# Patient Record
Sex: Female | Born: 1945 | Race: White | Hispanic: No | Smoking: Former smoker
Health system: Southern US, Community
[De-identification: ages and names within clinical notes are randomized; demographics above are authoritative.]

## PROBLEM LIST (undated history)

## (undated) DIAGNOSIS — M810 Age-related osteoporosis without current pathological fracture: Secondary | ICD-10-CM

## (undated) DIAGNOSIS — Z974 Presence of external hearing-aid: Secondary | ICD-10-CM

## (undated) DIAGNOSIS — J189 Pneumonia, unspecified organism: Secondary | ICD-10-CM

## (undated) DIAGNOSIS — I350 Nonrheumatic aortic (valve) stenosis: Secondary | ICD-10-CM

## (undated) DIAGNOSIS — R011 Cardiac murmur, unspecified: Secondary | ICD-10-CM

## (undated) DIAGNOSIS — I351 Nonrheumatic aortic (valve) insufficiency: Secondary | ICD-10-CM

## (undated) DIAGNOSIS — I4892 Unspecified atrial flutter: Secondary | ICD-10-CM

## (undated) DIAGNOSIS — I2510 Atherosclerotic heart disease of native coronary artery without angina pectoris: Secondary | ICD-10-CM

## (undated) DIAGNOSIS — R519 Headache, unspecified: Secondary | ICD-10-CM

## (undated) DIAGNOSIS — I421 Obstructive hypertrophic cardiomyopathy: Secondary | ICD-10-CM

## (undated) DIAGNOSIS — I5189 Other ill-defined heart diseases: Secondary | ICD-10-CM

## (undated) DIAGNOSIS — E785 Hyperlipidemia, unspecified: Secondary | ICD-10-CM

## (undated) DIAGNOSIS — Z87891 Personal history of nicotine dependence: Secondary | ICD-10-CM

## (undated) HISTORY — DX: Nonrheumatic aortic (valve) stenosis: I35.0

## (undated) HISTORY — DX: Cardiac murmur, unspecified: R01.1

## (undated) HISTORY — DX: Obstructive hypertrophic cardiomyopathy: I42.1

## (undated) HISTORY — PX: GANGLION CYST EXCISION: SHX1691

## (undated) HISTORY — DX: Unspecified atrial flutter: I48.92

## (undated) HISTORY — DX: Hyperlipidemia, unspecified: E78.5

## (undated) HISTORY — PX: TONSILLECTOMY: SUR1361

## (undated) HISTORY — PX: TUBAL LIGATION: SHX77

## (undated) HISTORY — DX: Age-related osteoporosis without current pathological fracture: M81.0

## (undated) HISTORY — PX: EYE SURGERY: SHX253

## (undated) HISTORY — DX: Atherosclerotic heart disease of native coronary artery without angina pectoris: I25.10

---

## 1980-04-26 HISTORY — PX: BREAST SURGERY: SHX581

## 1988-04-26 HISTORY — PX: HERNIA REPAIR: SHX51

## 1998-02-28 ENCOUNTER — Ambulatory Visit (HOSPITAL_BASED_OUTPATIENT_CLINIC_OR_DEPARTMENT_OTHER): Admission: RE | Admit: 1998-02-28 | Discharge: 1998-02-28 | Payer: Self-pay | Admitting: Orthopedic Surgery

## 1999-08-25 ENCOUNTER — Encounter: Admission: RE | Admit: 1999-08-25 | Discharge: 1999-08-25 | Payer: Self-pay | Admitting: Family Medicine

## 1999-08-25 ENCOUNTER — Encounter: Payer: Self-pay | Admitting: Family Medicine

## 2000-03-07 ENCOUNTER — Ambulatory Visit (HOSPITAL_COMMUNITY): Admission: RE | Admit: 2000-03-07 | Discharge: 2000-03-07 | Payer: Self-pay | Admitting: Family Medicine

## 2000-03-07 ENCOUNTER — Encounter: Payer: Self-pay | Admitting: Family Medicine

## 2000-07-06 ENCOUNTER — Ambulatory Visit (HOSPITAL_COMMUNITY): Admission: RE | Admit: 2000-07-06 | Discharge: 2000-07-06 | Payer: Self-pay | Admitting: Family Medicine

## 2000-07-06 ENCOUNTER — Encounter: Payer: Self-pay | Admitting: Family Medicine

## 2002-03-05 ENCOUNTER — Encounter: Payer: Self-pay | Admitting: Family Medicine

## 2002-03-05 ENCOUNTER — Ambulatory Visit: Admission: RE | Admit: 2002-03-05 | Discharge: 2002-03-05 | Payer: Self-pay | Admitting: Family Medicine

## 2003-04-15 ENCOUNTER — Ambulatory Visit (HOSPITAL_COMMUNITY): Admission: RE | Admit: 2003-04-15 | Discharge: 2003-04-15 | Payer: Self-pay | Admitting: *Deleted

## 2003-04-27 HISTORY — PX: CARDIAC CATHETERIZATION: SHX172

## 2003-04-27 HISTORY — PX: ATRIAL ABLATION SURGERY: SHX560

## 2003-07-23 ENCOUNTER — Inpatient Hospital Stay (HOSPITAL_COMMUNITY): Admission: RE | Admit: 2003-07-23 | Discharge: 2003-07-25 | Payer: Self-pay | Admitting: Internal Medicine

## 2003-12-04 ENCOUNTER — Other Ambulatory Visit: Admission: RE | Admit: 2003-12-04 | Discharge: 2003-12-04 | Payer: Self-pay | Admitting: Family Medicine

## 2006-04-04 ENCOUNTER — Encounter: Admission: RE | Admit: 2006-04-04 | Discharge: 2006-04-04 | Payer: Self-pay | Admitting: Interventional Cardiology

## 2008-10-09 ENCOUNTER — Encounter: Payer: Self-pay | Admitting: Cardiology

## 2009-01-20 ENCOUNTER — Encounter: Payer: Self-pay | Admitting: Cardiology

## 2009-03-06 ENCOUNTER — Ambulatory Visit: Payer: Self-pay | Admitting: Internal Medicine

## 2009-03-06 DIAGNOSIS — E785 Hyperlipidemia, unspecified: Secondary | ICD-10-CM | POA: Insufficient documentation

## 2009-03-06 DIAGNOSIS — M810 Age-related osteoporosis without current pathological fracture: Secondary | ICD-10-CM | POA: Insufficient documentation

## 2009-03-06 LAB — CONVERTED CEMR LAB
Bilirubin, Direct: 0.1 mg/dL (ref 0.0–0.3)
Cholesterol, target level: 200 mg/dL
HDL goal, serum: 40 mg/dL
HDL: 74 mg/dL (ref >39.00)
LDL Goal: 160 mg/dL
TSH: 0.74 microintl units/mL (ref 0.35–5.50)
Total Bilirubin: 0.8 mg/dL (ref 0.3–1.2)
Total Protein: 7.2 g/dL (ref 6.0–8.3)
Triglycerides: 83 mg/dL (ref 0.0–149.0)

## 2009-03-07 ENCOUNTER — Encounter: Payer: Self-pay | Admitting: Internal Medicine

## 2009-06-06 ENCOUNTER — Ambulatory Visit: Payer: Self-pay | Admitting: Internal Medicine

## 2009-06-06 DIAGNOSIS — R011 Cardiac murmur, unspecified: Secondary | ICD-10-CM | POA: Insufficient documentation

## 2009-06-10 ENCOUNTER — Encounter (INDEPENDENT_AMBULATORY_CARE_PROVIDER_SITE_OTHER): Payer: Self-pay | Admitting: *Deleted

## 2009-06-23 DIAGNOSIS — R079 Chest pain, unspecified: Secondary | ICD-10-CM | POA: Insufficient documentation

## 2009-06-23 DIAGNOSIS — I498 Other specified cardiac arrhythmias: Secondary | ICD-10-CM | POA: Insufficient documentation

## 2009-06-26 ENCOUNTER — Ambulatory Visit: Payer: Self-pay | Admitting: Cardiology

## 2009-06-26 DIAGNOSIS — I2510 Atherosclerotic heart disease of native coronary artery without angina pectoris: Secondary | ICD-10-CM | POA: Insufficient documentation

## 2009-06-26 DIAGNOSIS — I2583 Coronary atherosclerosis due to lipid rich plaque: Secondary | ICD-10-CM

## 2009-07-05 ENCOUNTER — Encounter: Payer: Self-pay | Admitting: Cardiology

## 2009-07-18 ENCOUNTER — Ambulatory Visit: Payer: Self-pay | Admitting: Cardiology

## 2009-07-18 ENCOUNTER — Ambulatory Visit: Payer: Self-pay

## 2009-07-18 ENCOUNTER — Ambulatory Visit (HOSPITAL_COMMUNITY): Admission: RE | Admit: 2009-07-18 | Discharge: 2009-07-18 | Payer: Self-pay | Admitting: Cardiology

## 2009-07-18 ENCOUNTER — Encounter (INDEPENDENT_AMBULATORY_CARE_PROVIDER_SITE_OTHER): Payer: Self-pay | Admitting: *Deleted

## 2009-07-18 ENCOUNTER — Encounter: Payer: Self-pay | Admitting: Cardiology

## 2009-07-24 ENCOUNTER — Telehealth: Payer: Self-pay | Admitting: Cardiology

## 2009-08-04 ENCOUNTER — Encounter: Payer: Self-pay | Admitting: Internal Medicine

## 2009-12-25 ENCOUNTER — Ambulatory Visit: Payer: Self-pay | Admitting: Internal Medicine

## 2009-12-25 DIAGNOSIS — H9190 Unspecified hearing loss, unspecified ear: Secondary | ICD-10-CM | POA: Insufficient documentation

## 2009-12-25 LAB — HM MAMMOGRAPHY: HM Mammogram: NORMAL

## 2010-01-06 ENCOUNTER — Ambulatory Visit: Payer: Self-pay | Admitting: Internal Medicine

## 2010-01-15 ENCOUNTER — Encounter: Payer: Self-pay | Admitting: Internal Medicine

## 2010-01-18 ENCOUNTER — Encounter: Payer: Self-pay | Admitting: Internal Medicine

## 2010-01-18 ENCOUNTER — Telehealth: Payer: Self-pay | Admitting: Internal Medicine

## 2010-01-30 ENCOUNTER — Ambulatory Visit: Payer: Self-pay | Admitting: Internal Medicine

## 2010-01-30 LAB — CONVERTED CEMR LAB
BUN: 23 mg/dL (ref 6–23)
Basophils Absolute: 0.1 10*3/uL (ref 0.0–0.1)
Calcium, Total (PTH): 9.8 mg/dL (ref 8.4–10.5)
Chloride: 103 meq/L (ref 96–112)
Creatinine, Ser: 0.7 mg/dL (ref 0.4–1.2)
Eosinophils Absolute: 0.1 10*3/uL (ref 0.0–0.7)
Eosinophils Relative: 1.4 % (ref 0.0–5.0)
MCHC: 34.6 g/dL (ref 30.0–36.0)
MCV: 91.3 fL (ref 78.0–100.0)
Monocytes Absolute: 0.5 10*3/uL (ref 0.1–1.0)
Neutrophils Relative %: 57.2 % (ref 43.0–77.0)
PTH: 48.5 pg/mL (ref 14.0–72.0)
Platelets: 299 10*3/uL (ref 150.0–400.0)
RDW: 12.8 % (ref 11.5–14.6)
TSH: 1 microintl units/mL (ref 0.35–5.50)
Total Bilirubin: 0.6 mg/dL (ref 0.3–1.2)
WBC: 7.5 10*3/uL (ref 4.5–10.5)

## 2010-02-04 ENCOUNTER — Encounter: Payer: Self-pay | Admitting: Internal Medicine

## 2010-02-10 ENCOUNTER — Telehealth: Payer: Self-pay | Admitting: Internal Medicine

## 2010-02-12 ENCOUNTER — Encounter: Payer: Self-pay | Admitting: Internal Medicine

## 2010-03-02 ENCOUNTER — Encounter: Payer: Self-pay | Admitting: Internal Medicine

## 2010-03-11 ENCOUNTER — Ambulatory Visit: Payer: Self-pay | Admitting: Internal Medicine

## 2010-03-11 LAB — CONVERTED CEMR LAB
Cholesterol, target level: 200 mg/dL
LDL Goal: 100 mg/dL

## 2010-05-01 ENCOUNTER — Ambulatory Visit
Admission: RE | Admit: 2010-05-01 | Discharge: 2010-05-01 | Payer: Self-pay | Source: Home / Self Care | Attending: Internal Medicine | Admitting: Internal Medicine

## 2010-05-28 NOTE — Miscellaneous (Signed)
Summary: Outpatient Coinsurance Notice  Outpatient Coinsurance Notice   Imported By: Marylou Mccoy 07/30/2009 09:23:19  _____________________________________________________________________  External Attachment:    Type:   Image     Comment:   External Document

## 2010-05-28 NOTE — Consult Note (Signed)
Summary: Consultation Report  Consultation Report   Imported By: Marylou Mccoy 07/31/2009 16:16:00  _____________________________________________________________________  External Attachment:    Type:   Image     Comment:   External Document

## 2010-05-28 NOTE — Progress Notes (Signed)
Summary: TEST RESULTS  Phone Note Call from Patient Call back at [phone]   Caller: Patient Reason for Call: Lab or Test Results Initial call taken by: Judie Grieve,  July 24, 2009 10:15 AM  Follow-up for Phone Call        [facility] for pt to call Katina Dung, RN, BSN  July 24, 2009 10:38 AM  talked with pt about echo results from 07-18-09

## 2010-05-28 NOTE — Progress Notes (Signed)
Summary: forteo  ---- Converted from flag ---- ---- 01/30/2010 4:15 PM, Etta Grandchild MD wrote: [place]: will you set her up for a forteo class.  TJ ------------------------------  Phone Note Outgoing Call   Summary of Call: Form completed and faxed to forte[facility], also sent to be scanned.Alvy Beal Archie CMA  February 10, 2010 1:04 PM

## 2010-05-28 NOTE — Assessment & Plan Note (Signed)
Summary: flu shot/Phillippe Orlick/lakisha/cd  Nurse Visit   Allergies: No Known Drug Allergies  Orders Added: 1)  Admin 1st Vaccine [90471] 2)  Flu Vaccine 44yrs + [13086] Flu Vaccine Consent Questions     Do you have a history of severe allergic reactions to this vaccine? no    Any prior history of allergic reactions to egg and/or gelatin? no    Do you have a sensitivity to the preservative Thimersol? no    Do you have a past history of Guillan-Barre Syndrome? no    Do you currently have an acute febrile illness? no    Have you ever had a severe reaction to latex? no    Vaccine information given and explained to patient? yes    Are you currently pregnant? no    Lot Number:[id number]   Exp Date:10/24/2010   Site Given  Left Deltoid IM

## 2010-05-28 NOTE — Assessment & Plan Note (Signed)
Summary: follow up-lb   Vital Signs:  Patient profile:   65 year old female Height:      61 inches Weight:      142 pounds BMI:     26.93 O2 Sat:      97 % on Room air Temp:     97.5 degrees F oral Pulse rate:   92 / minute Pulse rhythm:   regular Resp:     16 per minute BP sitting:   126 / 70  (left arm) Cuff size:   large  Vitals Entered By: Rock Nephew CMA (December 25, 2009 3:50 PM)  Nutrition Counseling: Patient's BMI is greater than 25 and therefore counseled on weight management options.  O2 Flow:  Room air  Primary Care Ihor Meinzer:  Etta Grandchild MD   History of Present Illness: She returns with several complaints. 1. left hearing loss for one year, she occasionally has vertigo 2. needs a complete physical and vaccines-she refuses to do a colonoscopy 3. needs a refill on ntg- says she never uses it 4. needs an updated BMD  Preventive Screening-Counseling & Management  Alcohol-Tobacco     Alcohol drinks/day: <1     Alcohol type: wine     >5/day in last 3 mos: no     Alcohol Counseling: not indicated; use of alcohol is not excessive or problematic     Feels need to cut down: no     Feels annoyed by complaints: no     Feels guilty re: drinking: no     Needs 'eye opener' in am: no     Smoking Status: quit     Year Quit: 2002     Tobacco Counseling: to remain off tobacco products  Hep-HIV-STD-Contraception     Hepatitis Risk: no risk noted     HIV Risk: no risk noted     STD Risk: no risk noted     Dental Visit-last 6 months yes     Dental Care Counseling: to seek dental care; no dental care within six months     SBE monthly: yes     SBE Education/Counseling: to perform regular SBE  Safety-Violence-Falls     Seat Belt Use: yes     Helmet Use: yes     Firearms in the Home: no firearms in the home     Smoke Detectors: yes     Violence in the Home: no risk noted     Sexual Abuse: no      Sexual History:  currently monogamous.        Drug Use:   no.        Blood Transfusions:  no.    Medications Prior to Update: 1)  Simvastatin 80 Mg Tabs (Simvastatin) .... Take 1 Tablet By Mouth Once A Day 2)  Alendronate Sodium 70 Mg Tabs (Alendronate Sodium) .... Weekly 3)  Fish Oil   Oil (Fish Oil) .... Tab By Mouth Once Daily 4)  Vitamin B-12 500 Mcg Tabs (Cyanocobalamin) .Marland Kitchen.. 1 Tab By Mouth Once Daily 5)  Vitamin D 1000 Unit Tabs (Cholecalciferol) .Marland Kitchen.. 1 Tab By Mouth Once Daily 6)  Aspirin 81 Mg  Tabs (Aspirin) .... Tab By Mouth Once Daily  Current Medications (verified): 1)  Simvastatin 80 Mg Tabs (Simvastatin) .... Take 1 Tablet By Mouth Once A Day 2)  Alendronate Sodium 70 Mg Tabs (Alendronate Sodium) .... Weekly 3)  Fish Oil   Oil (Fish Oil) .... Tab By Mouth Once Daily 4)  Vitamin  B-12 500 Mcg Tabs (Cyanocobalamin) .Marland Kitchen.. 1 Tab By Mouth Once Daily 5)  Vitamin D 1000 Unit Tabs (Cholecalciferol) .Marland Kitchen.. 1 Tab By Mouth Once Daily 6)  Aspirin 81 Mg  Tabs (Aspirin) .... Tab By Mouth Once Daily 7)  Nitrolingual 0.4 Mg/spray Soln (Nitroglycerin) .... 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 Ladona Ridgel OSTEOPOROSIS (ICD-733.00)-Dr. 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   Risk Factors: Alcohol Use: <1 (12/25/2009) >5 drinks/d w/in last 3 months: no (12/25/2009) Exercise: no (03/06/2009)  Risk Factors: Smoking Status: quit  (12/25/2009)  Family History: Reviewed history from 06/26/2009 and no changes required. Family History Hypertension Father had CABG and valve replacement at age 14 Mother with congestive heart failure  Social History: Reviewed history from 06/26/2009 and no changes required. Occupation: Banking Divorced Alcohol use-yes Drug use-no Regular exercise-no Tobacco Use - Former.  Dental Care w/in 6 mos.:  yes Seat Belt Use:  yes Blood Transfusions:  no Sexual History:  currently monogamous  Review of Systems       The patient complains of weight gain.  The patient denies anorexia, fever, weight loss, chest pain, syncope, dyspnea on exertion, peripheral edema, prolonged cough, headaches, hemoptysis, abdominal pain, hematuria, suspicious skin lesions, difficulty walking, depression, and enlarged lymph nodes.   ENT:  Complains of decreased hearing and ringing in ears; denies difficulty swallowing, ear discharge, earache, hoarseness, nasal congestion, nosebleeds, postnasal drainage, sinus pressure, and sore throat. CV:  Denies chest pain or discomfort, difficulty breathing at night, fainting, fatigue, lightheadness, near fainting, palpitations, shortness of breath with exertion, and swelling of feet.  Physical Exam  General:  Well-developed,well-nourished,in no acute distress; alert,appropriate and cooperative throughout examination Head:  Normocephalic and atraumatic without obvious abnormalities. No apparent alopecia or balding. Ears:  R ear normal and L ear normal.  R ear normal and L ear normal.   Nose:  External nasal examination shows no deformity or inflammation. Nasal mucosa are pink and moist without lesions or exudates. Mouth:  Oral mucosa and oropharynx without lesions or exudates.  Teeth in good repair. Neck:  supple, full ROM, no masses, no thyromegaly, no thyroid nodules or tenderness, no JVD, normal carotid upstroke, no carotid bruits, no cervical lymphadenopathy, and no neck  tenderness.   Lungs:  normal respiratory effort, no intercostal retractions, no accessory muscle use, normal breath sounds, and no dullness.   Heart:  normal rate, regular rhythm, no gallop, no rub, and grade  1/6 HSM.   Abdomen:  soft, non-tender, normal bowel sounds, no distention, no masses, no guarding, no rigidity, no rebound tenderness, no abdominal hernia, no inguinal hernia, no hepatomegaly, and no splenomegaly.   Msk:  normal ROM, no joint tenderness, no joint swelling, no joint warmth, no redness over joints, no joint deformities, no joint instability, and no crepitation.   Pulses:  R and L carotid,radial,femoral,dorsalis pedis and posterior tibial pulses are full and equal bilaterally Extremities:  No clubbing, cyanosis, edema, or deformity noted with normal full range of motion of all joints.   Neurologic:  No  cranial nerve deficits noted. Station and gait are normal. Plantar reflexes are down-going bilaterally. DTRs are symmetrical throughout. Sensory, motor and coordinative functions appear intact. Skin:  turgor normal, color normal, no rashes, no suspicious lesions, no ecchymoses, no petechiae, no purpura, no ulcerations, and no edema.   Cervical Nodes:  No lymphadenopathy noted Psych:  Cognition and judgment appear intact. Alert and cooperative with normal attention span and concentration. No apparent delusions, illusions, hallucinations   Impression & Recommendations:  Problem # 1:  UNSPECIFIED HEARING LOSS (ICD-389.9) Assessment New  Orders: Audiology (Audio)  Problem # 2:  OSTEOPOROSIS (ICD-733.00) Assessment: Unchanged  Her updated medication list for this problem includes:    Alendronate Sodium 70 Mg Tabs (Alendronate sodium) .Marland Kitchen... Weekly  Orders: T-Bone Densitometry [phone])  Discussed medication use, applications of heat or ice, and exercises.   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 Tab by mouth once daily    Nitrolingual 0.4 Mg/spray Soln (Nitroglycerin) ..... Use as directed  Complete Medication List: 1)  Simvastatin 80 Mg Tabs (Simvastatin) .... Take 1 tablet by mouth once a day 2)  Alendronate Sodium 70 Mg Tabs (Alendronate sodium) .... Weekly 3)  Fish Oil Oil (Fish oil) .... Tab by mouth once daily 4)  Vitamin B-12 500 Mcg Tabs (Cyanocobalamin) .Marland Kitchen.. 1 tab by mouth once daily 5)  Vitamin D 1000 Unit Tabs (Cholecalciferol) .Marland Kitchen.. 1 tab by mouth once daily 6)  Aspirin 81 Mg Tabs (Aspirin) .... Tab by mouth once daily 7)  Nitrolingual 0.4 Mg/spray Soln (Nitroglycerin) .... Use as directed  Other Orders: Tdap => 60yrs IM (60454) Admin 1st Vaccine (09811) Zoster (Shingles) Vaccine Live [phone]) Admin of Any Addtl Vaccine (29562)  Colorectal Screening:  Current Recommendations:    Hemoccult: patient refused    Colonoscopy recommended: patient refused  PAP Screening:    Hx Cervical Dysplasia in last 5 yrs? No    3 normal PAP smears in last 5 yrs? Yes    Last PAP smear:  09/05/2007    Reviewed PAP smear recommendations:  patient defers to GYN Carrie [name]  Mammogram Screening:    Reviewed Mammogram recommendations:  mammogram ordered  Osteoporosis Risk Assessment:  Risk Factors for Fracture or Low Bone Density:   Race (White or Asian):     yes   Hx of Fractures:       no   FH of Osteoporosis:     yes   Hx of falls:       no   Physically inactive:     yes   Smoking status:       quit   High alcohol use:     no   Low calcium/Vit. D intake:   no   Corticosteroid use:     no   Heparin use:       no   Thyroid disease:     no   Rheumatoid Arthritis:     no  Bone Density (DEXA Scan) Results:    Results:   Osteoporosis  Immunization & Chemoprophylaxis:    Tetanus vaccine: Tdap  (12/25/2009)  Patient Instructions: 1)  Please schedule a follow-up appointment in 2 months. 2)  It is important that you exercise regularly at least 20  minutes 5 times a week. If you develop chest pain, have severe difficulty breathing, or feel very tired , stop exercising immediately and seek medical attention. 3)  You need to lose weight. Consider a lower calorie diet and regular exercise.  4)  Schedule your mammogram. 5)  Schedule a colonoscopy/sigmoidoscopy to help detect colon cancer. 6)  You need to have a Pap Smear to prevent cervical cancer. 7)  Take calcium +Vitamin D daily. Prescriptions: NITROLINGUAL 0.4 MG/SPRAY SOLN (NITROGLYCERIN) use as directed  #4.9 g x 11   Entered and Authorized by:   Etta Grandchild MD   Signed by:   Etta Grandchild MD on 12/25/2009   Method used:   Print then Give to Patient   RxID:   [id number]     Immunizations Administered:  Tetanus Vaccine:    Vaccine Type: Tdap    Site: right deltoid    Mfr: GlaxoSmithKline    Dose: 0.5 ml    Route: IM    Given by: Rock Nephew CMA    Exp. Date: 10/24/2011    Lot #: [id number]    VIS given: 03/13/08 version given December 25, 2009.  Zostavax # 1:    Vaccine Type: Zostavax    Site: left deltoid    Mfr: Merck    Dose: 0.65    Route: [place]    Given by: Rock Nephew CMA    Exp. Date: 11/19/2010    Lot #: [id number]    VIS given: 02/05/05 given December 25, 2009.

## 2010-05-28 NOTE — Assessment & Plan Note (Signed)
Summary: FU PER PT D/T---STC   Vital Signs:  Patient profile:   65 year old female Height:      61 inches Weight:      142 pounds O2 Sat:      95 % on Room air Temp:     98.2 degrees F oral Pulse rate:   80 / minute Pulse rhythm:   regular Resp:     16 per minute BP sitting:   124 / 70  (left arm) Cuff size:   large  Vitals Entered By: Rock Nephew CMA (June 06, 2009 3:53 PM)  O2 Flow:  Room air CC: cough, r ear pressure, URI symptoms, Lipid Management   Primary Care Provider:  Etta Grandchild MD  CC:  cough, r ear pressure, URI symptoms, and Lipid Management.  History of Present Illness:  URI Symptoms      This is a 65 year old woman who presents with URI symptoms.  The symptoms began 3 days ago.  The severity is described as mild.  The patient reports sore throat and dry cough, but denies nasal congestion, clear nasal discharge, purulent nasal discharge, productive cough, earache, and sick contacts.  The patient denies fever, stiff neck, dyspnea, wheezing, rash, vomiting, diarrhea, and use of an antipyretic.  The patient denies sneezing, headache, muscle aches, and severe fatigue.  The patient denies the following risk factors for Strep sinusitis: unilateral facial pain, unilateral nasal discharge, poor response to decongestant, double sickening, tooth pain, Strep exposure, tender adenopathy, and absence of cough.    Lipid Management History:      Positive NCEP/ATP III risk factors include female age 65 years old or older.  Negative NCEP/ATP III risk factors include no history of early menopause without estrogen hormone replacement, non-diabetic, HDL cholesterol greater than 60, no family history for ischemic heart disease, non-tobacco-user status, non-hypertensive, no ASHD (atherosclerotic heart disease), no prior stroke/TIA, no peripheral vascular disease, and no history of aortic aneurysm.        The patient states that she knows about the "Therapeutic Lifestyle Change"  diet.  Her compliance with the TLC diet is fair.  The patient expresses understanding of adjunctive measures for cholesterol lowering.  Adjunctive measures started by the patient include aerobic exercise, fiber, limit alcohol consumpton, and weight reduction.  She expresses no side effects from her lipid-lowering medication.  The patient denies any symptoms to suggest myopathy or liver disease.     Preventive Screening-Counseling & Management  Alcohol-Tobacco     Alcohol drinks/day: <1     Alcohol type: wine     >5/day in last 3 mos: no     Alcohol Counseling: not indicated; use of alcohol is not excessive or problematic     Feels need to cut down: no     Feels annoyed by complaints: no     Feels guilty re: drinking: no     Needs 'eye opener' in am: no     Smoking Status: quit     Year Quit: 2002  Hep-HIV-STD-Contraception     Hepatitis Risk: no risk noted     HIV Risk: no risk noted     STD Risk: no risk noted      Drug Use:  no.    Clinical Review Panels:  Lipid Management   Cholesterol:  222 (03/06/2009)   HDL (good cholesterol):  74.00 (03/06/2009)  Complete Metabolic Panel   Albumin:  4.0 (03/06/2009)   Total Protein:  7.2 (03/06/2009)   Total  Bili:  0.8 (03/06/2009)   Alk Phos:  62 (03/06/2009)   SGPT (ALT):  22 (03/06/2009)   SGOT (AST):  21 (03/06/2009)   Medications Prior to Update: 1)  Simvastatin 80 Mg Tabs (Simvastatin) .... Take 1 Tablet By Mouth Once A Day 2)  Alendronate Sodium 70 Mg Tabs (Alendronate Sodium)  Current Medications (verified): 1)  Simvastatin 80 Mg Tabs (Simvastatin) .... Take 1 Tablet By Mouth Once A Day 2)  Alendronate Sodium 70 Mg Tabs (Alendronate Sodium)  Allergies (verified): No Known Drug Allergies  Past History:  Past Medical History: Reviewed history from 03/06/2009 and no changes required. Hyperlipidemia Osteoporosis-Dr. Luiz Blare Heart murmur- Dr. Ladona Ridgel  Past Surgical History: Reviewed history from 03/06/2009 and no  changes required. Denies surgical history  Family History: Reviewed history from 03/06/2009 and no changes required. Family History Hypertension  Social History: Reviewed history from 03/06/2009 and no changes required. Occupation: Photographer Divorced Alcohol use-yes Drug use-no Regular exercise-no  Review of Systems CV:  Denies bluish discoloration of lips or nails, chest pain or discomfort, difficulty breathing while lying down, fainting, fatigue, leg cramps with exertion, lightheadness, near fainting, palpitations, shortness of breath with exertion, swelling of feet, and weight gain.  Physical Exam  General:  Well-developed,well-nourished,in no acute distress; alert,appropriate and cooperative throughout examination Head:  Normocephalic and atraumatic without obvious abnormalities. No apparent alopecia or balding. Eyes:  No corneal or conjunctival inflammation noted. EOMI. Perrla. Funduscopic exam benign, without hemorrhages, exudates or papilledema. Vision grossly normal. Ears:  R ear normal and L ear normal.   Nose:  External nasal examination shows no deformity or inflammation. Nasal mucosa are pink and moist without lesions or exudates. Mouth:  Oral mucosa and oropharynx without lesions or exudates.  Teeth in good repair. Neck:  supple, full ROM, no masses, no thyromegaly, no JVD, normal carotid upstroke, and no carotid bruits.   Lungs:  normal respiratory effort, no intercostal retractions, no accessory muscle use, normal breath sounds, and no dullness.   Heart:  normal rate, regular rhythm, no gallop, no rub, and grade  1/6 HSM.   Abdomen:  soft, non-tender, normal bowel sounds, no distention, no masses, no guarding, no rigidity, no rebound tenderness, no hepatomegaly, and no splenomegaly.   Msk:  normal ROM, no joint tenderness, no joint swelling, and no joint warmth.   Pulses:  R and L carotid,radial,femoral,dorsalis pedis and posterior tibial pulses are full and equal  bilaterally Extremities:  No clubbing, cyanosis, edema, or deformity noted with normal full range of motion of all joints.   Neurologic:  No cranial nerve deficits noted. Station and gait are normal. Plantar reflexes are down-going bilaterally. DTRs are symmetrical throughout. Sensory, motor and coordinative functions appear intact. Skin:  turgor normal, color normal, no rashes, no suspicious lesions, no ecchymoses, no petechiae, no purpura, no ulcerations, and no edema.   Cervical Nodes:  No lymphadenopathy noted Axillary Nodes:  No palpable lymphadenopathy Psych:  Cognition and judgment appear intact. Alert and cooperative with normal attention span and concentration. 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 Take 1 tablet by mouth once a day  Labs Reviewed: SGOT: 21 (03/06/2009)   SGPT: 22 (03/06/2009)  Lipid Goals: Chol Goal: 200 (03/06/2009)   HDL Goal:  40 (03/06/2009)   LDL Goal: 160 (03/06/2009)   TG Goal: 150 (03/06/2009)  Prior 10 Yr Risk Heart Disease: Not enough information (03/06/2009)   HDL:74.00 (03/06/2009)  Chol:222 (03/06/2009)  Trig:83.0 (03/06/2009)  Complete Medication List: 1)  Simvastatin 80 Mg Tabs (Simvastatin) .... Take 1 tablet by mouth once a day 2)  Alendronate Sodium 70 Mg Tabs (Alendronate sodium)  Lipid Assessment/Plan:      Based on NCEP/ATP III, the patient's risk factor category is "0-1 risk factors".  The patient's lipid goals are as follows: Total cholesterol goal is 200; LDL cholesterol goal is 160; HDL cholesterol goal is 40; Triglyceride goal is 150.    Patient Instructions: 1)  Please schedule a follow-up appointment in 4  months. 2)  Get plenty of rest, drink lots of clear liquids, and use Tylenol or Ibuprofen for fever and comfort. Return in 7-10 days if you're not better:sooner if you're feeling worse. Prescriptions: SIMVASTATIN 80 MG TABS (SIMVASTATIN) Take 1 tablet by mouth once a day  #30 x 11   Entered and Authorized by:   Etta Grandchild MD   Signed by:   Etta Grandchild MD on 06/06/2009   Method used:   Print then Give to Patient   RxID:   [id number]

## 2010-05-28 NOTE — Assessment & Plan Note (Signed)
Summary: PER PT RECEIVED SCHED APPT LETTER-GO OVER BONE DENSITY--STC   Vital Signs:  Patient profile:   65 year old female Height:      61 inches Weight:      145 pounds BMI:     27.50 O2 Sat:      97 % on Room air Temp:     98.7 degrees F oral Pulse rate:   77 / minute Pulse rhythm:   regular Resp:     16 per minute BP sitting:   112 / 68  (left arm) Cuff size:   large  Vitals Entered By: Rock Nephew CMA (January 30, 2010 3:52 PM)  Nutrition Counseling: Patient's BMI is greater than 25 and therefore counseled on weight management options.  O2 Flow:  Room air  Primary Care Provider:  Etta Grandchild MD   History of Present Illness: She returns to talk about treatment options for osteoporosis, her BMD showed STD -2.5 and worse. She has taken bisphospsonates for "a while" but she is not sure if it has been more than 5 years ago and she stopped bisphosphonates "a while back."  She has no complaints today.  Current Medications (verified): 1)  Simvastatin 80 Mg Tabs (Simvastatin) .... Take 1 Tablet By Mouth Once A Day 2)  Alendronate Sodium 70 Mg Tabs (Alendronate Sodium) .... Weekly 3)  Fish Oil   Oil (Fish Oil) .... Tab By Mouth Once Daily 4)  Vitamin B-12 500 Mcg Tabs (Cyanocobalamin) .Marland Kitchen.. 1 Tab By Mouth Once Daily 5)  Vitamin D 1000 Unit Tabs (Cholecalciferol) .Marland Kitchen.. 1 Tab By Mouth Once Daily 6)  Aspirin 81 Mg  Tabs (Aspirin) .... Tab By Mouth Once Daily 7)  Nitrolingual 0.4 Mg/spray Soln (Nitroglycerin) .... 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 Ladona Ridgel OSTEOPOROSIS (ICD-733.00)-Dr. 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   Risk Factors: Alcohol Use: <1 (12/25/2009) >5 drinks/d w/in last 3 months: no (12/25/2009) Exercise: no (03/06/2009)  Risk Factors: Smoking Status: quit (12/25/2009)  Family History: Reviewed history from 06/26/2009 and no changes required. Family History Hypertension Father had CABG and valve replacement at age 45 Mother with congestive heart failure  Social History: Reviewed history from 06/26/2009 and no changes required. Occupation: Banking Divorced Alcohol use-yes Drug use-no Regular exercise-no Tobacco Use - Former.   Review of Systems       The patient complains of weight gain.  The patient denies weight loss, chest pain, syncope, dyspnea on exertion, peripheral edema, prolonged cough, headaches, hemoptysis, abdominal pain, and difficulty walking.   CV:  Denies chest pain or discomfort, difficulty breathing at night, difficulty breathing while lying down, fainting, fatigue, near fainting, palpitations, shortness of breath with exertion, and swelling of feet. MS:  Denies joint pain, joint redness, joint swelling, loss of strength, low back pain, mid back pain, muscle aches, cramps, and thoracic pain.  Physical Exam  General:  Well-developed,well-nourished,in no acute distress; alert,appropriate and cooperative throughout examination Head:  Normocephalic and atraumatic without obvious abnormalities. No apparent alopecia or balding. Mouth:  Oral mucosa and oropharynx without lesions or exudates.  Teeth in good repair. Neck:  supple, full ROM, no masses, no thyromegaly, no thyroid nodules or tenderness, no JVD, normal carotid upstroke, no carotid bruits, no cervical lymphadenopathy, and no neck  tenderness.   Lungs:  normal respiratory effort, no intercostal retractions, no accessory muscle use, normal breath sounds, and no dullness.   Heart:  normal rate, regular rhythm, no gallop, no rub, and grade  1/6 HSM.   Abdomen:  soft, non-tender, normal bowel sounds, no distention, no masses, no guarding, no rigidity, no rebound tenderness, no abdominal hernia, no inguinal hernia, no hepatomegaly, and no splenomegaly.   Msk:  normal ROM, no joint tenderness, no joint swelling, no joint warmth, no redness over joints, no joint deformities, no joint instability, and no crepitation.   Pulses:  R and L carotid,radial,femoral,dorsalis pedis and posterior tibial pulses are full and equal bilaterally Extremities:  No clubbing, cyanosis, edema, or deformity noted with normal full range of motion of all joints.   Neurologic:  No cranial nerve deficits noted. Station and gait are normal. Plantar reflexes are down-going bilaterally. DTRs are symmetrical throughout. Sensory, motor and coordinative functions appear intact. Skin:  turgor normal, color normal, no rashes, no suspicious lesions, no ecchymoses, no petechiae, no purpura, no ulcerations, and no edema.   Cervical Nodes:  No lymphadenopathy noted Psych:  Cognition and judgment appear intact. Alert and cooperative with normal attention span and concentration. No apparent delusions, illusions, hallucinations   Impression & Recommendations:  Problem # 1:  OSTEOPOROSIS (ICD-733.00) Assessment Deteriorated  I have recommended that she start Forteo and have referred her for a class to get informed about it The following medications were removed from the medication list:    Alendronate Sodium 70 Mg Tabs (Alendronate sodium) .Marland Kitchen... Weekly  Orders: Venipuncture ([address]) T-Vitamin D (25-Hydroxy) [phone]) T-Parathyroid Hormone, Intact w/ Calcium (91478-29562) TLB-BMP (Basic Metabolic Panel-BMET) (80048-METABOL) TLB-CBC Platelet - w/Differential  (85025-CBCD) TLB-TSH (Thyroid Stimulating Hormone) (84443-TSH) TLB-Hepatic/Liver Function Pnl (80076-HEPATIC)  Problem # 2:  SUPRAVENTRICULAR TACHYCARDIA (ICD-427.89) Assessment: Unchanged  Her updated medication list for this problem includes:    Aspirin 81 Mg Tabs (Aspirin) .Marland Kitchen... Tab by mouth once daily  Labs Reviewed:  TSH: 0.74 (03/06/2009)     Complete Medication List: 1)  Simvastatin 80 Mg Tabs (Simvastatin) .... Take 1 tablet by mouth once a day 2)  Fish Oil Oil (Fish oil) .... Tab by mouth once daily 3)  Vitamin B-12 500 Mcg Tabs (Cyanocobalamin) .Marland Kitchen.. 1 tab by mouth once daily 4)  Vitamin D 1000 Unit Tabs (Cholecalciferol) .Marland Kitchen.. 1 tab by mouth once daily 5)  Aspirin 81 Mg Tabs (Aspirin) .... Tab by mouth once daily 6)  Nitrolingual 0.4 Mg/spray Soln (Nitroglycerin) .... Use as directed  Patient Instructions: 1)  Take calcium +Vitamin D daily. 2)  Please schedule a follow-up appointment in 1 month. 3)  It is important that you exercise regularly at least 20 minutes 5 times a week. If you develop chest pain, have severe difficulty breathing, or feel very tired , stop exercising immediately and seek medical attention.

## 2010-05-28 NOTE — Assessment & Plan Note (Signed)
Summary: cough/#/cd   Vital Signs:  Patient profile:   65 year old female Height:      61 inches Weight:      143 pounds BMI:     27.12 O2 Sat:      97 % on Room air Temp:     98.1 degrees F oral Pulse rate:   86 / minute Pulse rhythm:   regular Resp:     16 per minute BP sitting:   130 / 78  (left arm) Cuff size:   large  Vitals Entered By: Rock Nephew CMA (March 11, 2010 2:18 PM)  Nutrition Counseling: Patient's BMI is greater than 25 and therefore counseled on weight management options.  O2 Flow:  Room air CC: Patient c/o dry, hacky cough x 1wk, URI symptoms, Lipid Management Is Patient Diabetic? No Pain Assessment Patient in pain? no       Does patient need assistance? Functional Status Self care Ambulation Normal   Primary Care Provider:  Etta Grandchild MD  CC:  Patient c/o dry, hacky cough x 1wk, URI symptoms, and Lipid Management.  History of Present Illness:  URI Symptoms      This is a 65 year old woman who presents with URI symptoms.  The symptoms began 1 week ago.  The severity is described as mild.  The patient reports dry cough, but denies nasal congestion, clear nasal discharge, purulent nasal discharge, sore throat, productive cough, earache, and sick contacts.  Associated symptoms include dyspnea.  The patient denies fever, stiff neck, wheezing, rash, vomiting, diarrhea, use of an antipyretic, and response to antipyretic.  The patient denies itchy throat, sneezing, seasonal symptoms, headache, muscle aches, and severe fatigue.  The patient denies the following risk factors for Strep sinusitis: unilateral facial pain, unilateral nasal discharge, poor response to decongestant, double sickening, tooth pain, Strep exposure, tender adenopathy, and absence of cough.    Lipid Management History:      Positive NCEP/ATP III risk factors include female age 4 years old or older and ASHD (either angina/prior MI/prior CABG).  Negative NCEP/ATP III risk factors  include no history of early menopause without estrogen hormone replacement, non-diabetic, HDL cholesterol greater than 60, no family history for ischemic heart disease, non-tobacco-user status, non-hypertensive, no prior stroke/TIA, no peripheral vascular disease, and no history of aortic aneurysm.        The patient states that she knows about the "Therapeutic Lifestyle Change" diet.  Her compliance with the TLC diet is fair.  The patient expresses understanding of adjunctive measures for cholesterol lowering.  Adjunctive measures started by the patient include aerobic exercise, fiber, omega-3 supplements, limit alcohol consumpton, and weight reduction.  She expresses no side effects from her lipid-lowering medication.  The patient denies any symptoms to suggest myopathy or liver disease.     Preventive Screening-Counseling & Management  Alcohol-Tobacco     Alcohol drinks/day: <1     Alcohol type: wine     >5/day in last 3 mos: no     Alcohol Counseling: not indicated; use of alcohol is not excessive or problematic     Feels need to cut down: no     Feels annoyed by complaints: no     Feels guilty re: drinking: no     Needs 'eye opener' in am: no     Smoking Status: quit     Year Quit: 2002     Tobacco Counseling: to remain off tobacco products  Hep-HIV-STD-Contraception  Hepatitis Risk: no risk noted     HIV Risk: no risk noted     STD Risk: no risk noted     Dental Visit-last 6 months yes     Dental Care Counseling: to seek dental care; no dental care within six months     SBE monthly: yes     SBE Education/Counseling: to perform regular SBE      Sexual History:  currently monogamous.        Drug Use:  no.        Blood Transfusions:  no.    Clinical Review Panels:  Prevention   Last Mammogram:  Normal Bilateral (01/15/2010)   Last Pap Smear:  Normal (09/05/2007)  Immunizations   Last Tetanus Booster:  Tdap (12/25/2009)   Last Flu Vaccine:  Fluvax 3+ (01/06/2010)    Last Zoster Vaccine:  Zostavax (12/25/2009)  Lipid Management   Cholesterol:  222 (03/06/2009)   HDL (good cholesterol):  74.00 (03/06/2009)  Diabetes Management   Creatinine:  0.7 (01/30/2010)   Last Flu Vaccine:  Fluvax 3+ (01/06/2010)  CBC   WBC:  7.5 (01/30/2010)   RBC:  4.42 (01/30/2010)   Hgb:  13.9 (01/30/2010)   Hct:  40.3 (01/30/2010)   Platelets:  299.0 (01/30/2010)   MCV  91.3 (01/30/2010)   MCHC  34.6 (01/30/2010)   RDW  12.8 (01/30/2010)   PMN:  57.2 (01/30/2010)   Lymphs:  33.4 (01/30/2010)   Monos:  7.1 (01/30/2010)   Eosinophils:  1.4 (01/30/2010)   Basophil:  0.9 (01/30/2010)  Complete Metabolic Panel   Glucose:  86 (01/30/2010)   Sodium:  140 (01/30/2010)   Potassium:  4.6 (01/30/2010)   Chloride:  103 (01/30/2010)   CO2:  30 (01/30/2010)   BUN:  23 (01/30/2010)   Creatinine:  0.7 (01/30/2010)   Albumin:  4.1 (01/30/2010)   Total Protein:  7.5 (01/30/2010)   Calcium:  9.6 (01/30/2010)   Total Bili:  0.6 (01/30/2010)   Alk Phos:  62 (01/30/2010)   SGPT (ALT):  21 (01/30/2010)   SGOT (AST):  20 (01/30/2010)   Medications Prior to Update: 1)  Simvastatin 80 Mg Tabs (Simvastatin) .... Take 1 Tablet By Mouth Once A Day 2)  Fish Oil   Oil (Fish Oil) .... Tab By Mouth Once Daily 3)  Vitamin B-12 500 Mcg Tabs (Cyanocobalamin) .Marland Kitchen.. 1 Tab By Mouth Once Daily 4)  Vitamin D 1000 Unit Tabs (Cholecalciferol) .Marland Kitchen.. 1 Tab By Mouth Once Daily 5)  Aspirin 81 Mg  Tabs (Aspirin) .... Tab By Mouth Once Daily 6)  Nitrolingual 0.4 Mg/spray Soln (Nitroglycerin) .... Use As Directed  Current Medications (verified): 1)  Simvastatin 80 Mg Tabs (Simvastatin) .... Take 1 Tablet By Mouth Once A Day 2)  Fish Oil   Oil (Fish Oil) .... Tab By Mouth Once Daily 3)  Vitamin B-12 500 Mcg Tabs (Cyanocobalamin) .Marland Kitchen.. 1 Tab By Mouth Once Daily 4)  Vitamin D 1000 Unit Tabs (Cholecalciferol) .Marland Kitchen.. 1 Tab By Mouth Once Daily 5)  Aspirin 81 Mg  Tabs (Aspirin) .... Tab By Mouth Once Daily 6)   Nitrolingual 0.4 Mg/spray Soln (Nitroglycerin) .... Use As Directed 7)  Zithromax Tri-Pak 500 Mg Tab (Azithromycin) .... Take One By Mouth Once Daily For 3 Days 8)  Promethazine-Dm 6.25-15 Mg/33ml Syrp (Promethazine-Dm) .... 5-10 Ml By Mouth Qid As Needed For Cough  Allergies (verified): 1)  ! 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 Ladona Ridgel OSTEOPOROSIS (ICD-733.00)-Dr. 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   Risk Factors: Alcohol Use: <1 (03/11/2010) >5 drinks/d w/in last 3 months: no (03/11/2010) Exercise: no (03/06/2009)  Risk Factors: Smoking Status: quit (03/11/2010)  Family History: Reviewed history from 06/26/2009 and no changes required. Family History Hypertension Father had CABG and valve replacement at age 60 Mother with congestive heart failure  Social History: Reviewed history from 06/26/2009 and no changes required. Occupation: Banking Divorced Alcohol use-yes Drug use-no Regular exercise-no Tobacco Use - Former.   Review of Systems       The patient complains of hoarseness and prolonged cough.  The patient denies anorexia, fever, weight loss, weight gain, decreased hearing, chest pain, syncope, dyspnea on exertion, peripheral edema, headaches, hemoptysis, abdominal pain, hematuria, suspicious skin lesions, transient blindness, enlarged lymph nodes, and angioedema.    Physical Exam  General:   Well-developed,well-nourished,in no acute distress; alert,appropriate and cooperative throughout examination Head:  Normocephalic and atraumatic without obvious abnormalities. No apparent alopecia or balding. Eyes:  No corneal or conjunctival inflammation noted. EOMI. Perrla. Funduscopic exam benign, without hemorrhages, exudates or papilledema. Vision grossly normal. Ears:  R ear normal and L ear normal.   Nose:  External nasal examination shows no deformity or inflammation. Nasal mucosa are pink and moist without lesions or exudates. Mouth:  Oral mucosa and oropharynx without lesions or exudates.  Teeth in good repair. Neck:  supple, full ROM, no masses, no thyromegaly, no thyroid nodules or tenderness, no JVD, normal carotid upstroke, and no carotid bruits.   Lungs:  normal respiratory effort, no intercostal retractions, no accessory muscle use, normal breath sounds, no dullness, no fremitus, no crackles, and no wheezes.   Heart:  normal rate, regular rhythm, no murmur, no gallop, no rub, and no JVD.   Abdomen:  soft, non-tender, normal bowel sounds, no distention, no masses, no guarding, no rigidity, no rebound tenderness, no abdominal hernia, no inguinal hernia, no hepatomegaly, and no splenomegaly.   Msk:  normal ROM, no joint tenderness, no joint swelling, no joint warmth, no redness over joints, no joint deformities, no joint instability, and no crepitation.   Pulses:  R and L carotid,radial,femoral,dorsalis pedis and posterior tibial pulses are full and equal bilaterally Extremities:  No clubbing, cyanosis, edema, or deformity noted with normal full range of motion of all joints.   Neurologic:  No cranial nerve deficits noted. Station and gait are normal. Plantar reflexes are down-going bilaterally. DTRs are symmetrical throughout. Sensory, motor and coordinative functions appear intact. Skin:  turgor normal, color normal, no rashes, no suspicious lesions, no ecchymoses, no petechiae, no  purpura, no ulcerations, and no edema.   Cervical Nodes:  No lymphadenopathy noted Axillary Nodes:  No palpable lymphadenopathy Psych:  Cognition and judgment appear intact. Alert and cooperative with normal attention span and concentration. No apparent delusions, illusions, hallucinations   Impression & Recommendations:  Problem # 1:  COUGH (ICD-786.2) Assessment New will check for pna, edema, mass Orders: T-2 View CXR (71020TC)  Problem # 2:  BRONCHITIS-ACUTE (ICD-466.0)  Her updated medication list for this problem includes:    Zithromax Tri-pak 500 Mg Tab (Azithromycin) .Marland KitchenMarland KitchenMarland KitchenMarland Kitchen  Take one by mouth once daily for 3 days    Promethazine-dm 6.25-15 Mg/56ml Syrp (Promethazine-dm) .Marland Kitchen... 5-10 ml by mouth qid as needed for cough  Take antibiotics and other medications as directed. Encouraged to push clear liquids, get enough rest, and take acetaminophen as needed. To be seen in 5-7 days if no improvement, sooner if worse.  Problem # 3:  OSTEOPOROSIS (ICD-733.00) Assessment: Unchanged  she will not do Forteo  Problem # 4:  HYPERLIPIDEMIA (ICD-272.4) Assessment: Unchanged  Her updated medication list for this problem includes:    Simvastatin 80 Mg Tabs (Simvastatin) .Marland Kitchen... Take 1 tablet by mouth once a day  Labs Reviewed: SGOT: 20 (01/30/2010)   SGPT: 21 (01/30/2010)  Lipid Goals: Chol Goal: 200 (03/11/2010)   HDL Goal: 40 (03/11/2010)   LDL Goal: 100 (03/11/2010)   TG Goal: 150 (03/11/2010)  10 Yr Risk Heart Disease: N/A Prior 10 Yr Risk Heart Disease: Not enough information (03/06/2009)   HDL:74.00 (03/06/2009)  Chol:222 (03/06/2009)  Trig:83.0 (03/06/2009)  Complete Medication List: 1)  Simvastatin 80 Mg Tabs (Simvastatin) .... Take 1 tablet by mouth once a day 2)  Fish Oil Oil (Fish oil) .... Tab by mouth once daily 3)  Vitamin B-12 500 Mcg Tabs (Cyanocobalamin) .Marland Kitchen.. 1 tab by mouth once daily 4)  Vitamin D 1000 Unit Tabs (Cholecalciferol) .Marland Kitchen.. 1 tab by mouth once daily 5)   Aspirin 81 Mg Tabs (Aspirin) .... Tab by mouth once daily 6)  Nitrolingual 0.4 Mg/spray Soln (Nitroglycerin) .... Use as directed 7)  Zithromax Tri-pak 500 Mg Tab (Azithromycin) .... Take one by mouth once daily for 3 days 8)  Promethazine-dm 6.25-15 Mg/48ml Syrp (Promethazine-dm) .... 5-10 ml by mouth qid as needed for cough  Lipid Assessment/Plan:      Based on NCEP/ATP III, the patient's risk factor category is "history of coronary disease, peripheral vascular disease, cerebrovascular disease, or aortic aneurysm".  The patient's lipid goals are as follows: Total cholesterol goal is 200; LDL cholesterol goal is 100; HDL cholesterol goal is 40; Triglyceride goal is 150.    Patient Instructions: 1)  Please schedule a follow-up appointment in 2 weeks. 2)  Take your antibiotic as prescribed until ALL of it is gone, but stop if you develop a rash or swelling and contact our office as soon as possible. 3)  Acute bronchitis symptoms for less than 10 days are not helped by antibiotics. take over the counter cough medications. call if no improvment in  5-7 days, sooner if increasing cough, fever, or new symptoms( shortness of breath, chest pain). Prescriptions: PROMETHAZINE-DM 6.25-15 MG/5ML SYRP (PROMETHAZINE-DM) 5-10 ml by mouth QID as needed for cough  #6 ounces x 1   Entered and Authorized by:   Etta Grandchild MD   Signed by:   Etta Grandchild MD on 03/11/2010   Method used:   Print then Give to Patient   RxID:   [id number] ZITHROMAX TRI-PAK 500 MG TAB (AZITHROMYCIN) Take one by mouth once daily for 3 days  #3 x 0   Entered and Authorized by:   Etta Grandchild MD   Signed by:   Etta Grandchild MD on 03/11/2010   Method used:   Print then Give to Patient   RxID:   [id number]    Orders Added: 1)  T-2 View CXR [71020TC] 2)  Est. Patient Level V [52841]

## 2010-05-28 NOTE — Consult Note (Signed)
Summary: [facility]   Imported By: Marylou Mccoy 07/31/2009 16:06:17  _____________________________________________________________________  External Attachment:    Type:   Image     Comment:   External Document

## 2010-05-28 NOTE — Letter (Signed)
Summary: [facility] Consult Scheduled Letter  [facility]  [address] [place], [place] [address]   Phone: [phone]  Fax: [phone]      06/10/2009 MRN: [id number]  [facility] [address] [place], [place]  [address]    Dear Ms. Excell Seltzer,      We have scheduled an appointment for you.  At the recommendation of Dr.Thomas,we have scheduled you a consult with Dr. Mclean(Cardiology) on March 3,2011 at 3:30pm .Their phone number is [phone]. If this appointment day and time is not convenient for you, please feel free to call the office of the doctor you are being referred to at the number listed above and reschedule the appointment.  [facility]  [address] [place] [address]  Thank you,  Patient Care Coordinator [facility]

## 2010-05-28 NOTE — Medication Information (Signed)
Summary: Update/[facility]  Update/[facility]   Imported By: Lester Petersburg 03/05/2010 07:30:15  _____________________________________________________________________  External Attachment:    Type:   Image     Comment:   External Document

## 2010-05-28 NOTE — Progress Notes (Signed)
    PAP Screening:    Last PAP smear:  09/05/2007  Mammogram Screening:    Last Mammogram:  01/15/2010  Mammogram Results:    Date of Exam:  01/15/2010    Results:  Normal Bilateral  Osteoporosis Risk Assessment:  Risk Factors for Fracture or Low Bone Density:   Race (White or Asian):     yes   Hx of Fractures:       no   FH of Osteoporosis:     yes   Hx of falls:       no   Physically inactive:     yes   Smoking status:       quit   High alcohol use:     no   Low calcium/Vit. D intake:   no   Corticosteroid use:     no   Heparin use:       no   Thyroid disease:     no   Rheumatoid Arthritis:     no  Immunization & Chemoprophylaxis:    Tetanus vaccine: Tdap  (12/25/2009)    Influenza vaccine: Fluvax 3+  (01/06/2010)

## 2010-05-28 NOTE — Medication Information (Signed)
Summary: Forms/[facility] [facility] Patient Support Program  Forms/[facility] [facility] Patient Support Program   Imported By: Sherian Rein 02/16/2010 13:29:30  _____________________________________________________________________  External Attachment:    Type:   Image     Comment:   External Document

## 2010-05-28 NOTE — Assessment & Plan Note (Signed)
Summary: chest congestion/cd   Vital Signs:  Patient profile:   65 year old female Menstrual status:  postmenopausal Height:      61 inches Weight:      143 pounds BMI:     27.12 O2 Sat:      96 % on Room air Temp:     98.6 degrees F oral Pulse rate:   95 / minute Pulse rhythm:   regular Resp:     16 per minute BP sitting:   134 / 72  (left arm) Cuff size:   large  Vitals Entered By: Rock Nephew CMA (May 01, 2010 3:47 PM)  Nutrition Counseling: Patient's BMI is greater than 25 and therefore counseled on weight management options.  O2 Flow:  Room air CC: Patient called c/o cough, sinus pressure, URI symptoms Is Patient Diabetic? No Pain Assessment Patient in pain? no       Does patient need assistance? Functional Status Self care Ambulation Normal     Menstrual Status postmenopausal Last PAP Result Normal   Primary Care Provider:  Etta Grandchild MD  CC:  Patient called c/o cough, sinus pressure, and URI symptoms.  History of Present Illness:  URI Symptoms      This is a 65 year old woman who presents with URI symptoms.  The symptoms began 4 days ago.  The severity is described as mild.  The patient reports sore throat and productive cough, but denies nasal congestion, clear nasal discharge, purulent nasal discharge, earache, and sick contacts.  The patient denies fever, stiff neck, dyspnea, wheezing, rash, vomiting, diarrhea, use of an antipyretic, and response to antipyretic.  The patient denies itchy watery eyes, itchy throat, sneezing, seasonal symptoms, response to antihistamine, headache, muscle aches, and severe fatigue.  The patient denies the following risk factors for Strep sinusitis: unilateral facial pain, unilateral nasal discharge, poor response to decongestant, double sickening, tooth pain, Strep exposure, tender adenopathy, and absence of cough.    Preventive Screening-Counseling & Management  Alcohol-Tobacco     Alcohol drinks/day: <1  Alcohol type: wine     >5/day in last 3 mos: no     Alcohol Counseling: not indicated; use of alcohol is not excessive or problematic     Feels need to cut down: no     Feels annoyed by complaints: no     Feels guilty re: drinking: no     Needs 'eye opener' in am: no     Smoking Status: quit     Year Quit: 2002     Tobacco Counseling: to remain off tobacco products  Hep-HIV-STD-Contraception     Hepatitis Risk: no risk noted     HIV Risk: no risk noted     STD Risk: no risk noted     Dental Visit-last 6 months yes     Dental Care Counseling: to seek dental care; no dental care within six months     SBE monthly: yes     SBE Education/Counseling: to perform regular SBE      Sexual History:  currently monogamous.        Drug Use:  no.        Blood Transfusions:  no.    Clinical Review Panels:  Prevention   Last Mammogram:  Normal Bilateral (01/15/2010)   Last Pap Smear:  Normal (09/05/2007)  Immunizations   Last Tetanus Booster:  Tdap (12/25/2009)   Last Flu Vaccine:  Fluvax 3+ (01/06/2010)   Last Zoster Vaccine:  Zostavax (  12/25/2009)  Lipid Management   Cholesterol:  222 (03/06/2009)   HDL (good cholesterol):  74.00 (03/06/2009)  Diabetes Management   Creatinine:  0.7 (01/30/2010)   Last Flu Vaccine:  Fluvax 3+ (01/06/2010)  CBC   WBC:  7.5 (01/30/2010)   RBC:  4.42 (01/30/2010)   Hgb:  13.9 (01/30/2010)   Hct:  40.3 (01/30/2010)   Platelets:  299.0 (01/30/2010)   MCV  91.3 (01/30/2010)   MCHC  34.6 (01/30/2010)   RDW  12.8 (01/30/2010)   PMN:  57.2 (01/30/2010)   Lymphs:  33.4 (01/30/2010)   Monos:  7.1 (01/30/2010)   Eosinophils:  1.4 (01/30/2010)   Basophil:  0.9 (01/30/2010)  Complete Metabolic Panel   Glucose:  86 (01/30/2010)   Sodium:  140 (01/30/2010)   Potassium:  4.6 (01/30/2010)   Chloride:  103 (01/30/2010)   CO2:  30 (01/30/2010)   BUN:  23 (01/30/2010)   Creatinine:  0.7 (01/30/2010)   Albumin:  4.1 (01/30/2010)   Total Protein:  7.5  (01/30/2010)   Calcium:  9.6 (01/30/2010)   Total Bili:  0.6 (01/30/2010)   Alk Phos:  62 (01/30/2010)   SGPT (ALT):  21 (01/30/2010)   SGOT (AST):  20 (01/30/2010)   Medications Prior to Update: 1)  Simvastatin 80 Mg Tabs (Simvastatin) .... Take 1 Tablet By Mouth Once A Day 2)  Fish Oil   Oil (Fish Oil) .... Tab By Mouth Once Daily 3)  Vitamin B-12 500 Mcg Tabs (Cyanocobalamin) .Marland Kitchen.. 1 Tab By Mouth Once Daily 4)  Vitamin D 1000 Unit Tabs (Cholecalciferol) .Marland Kitchen.. 1 Tab By Mouth Once Daily 5)  Aspirin 81 Mg  Tabs (Aspirin) .... Tab By Mouth Once Daily 6)  Nitrolingual 0.4 Mg/spray Soln (Nitroglycerin) .... Use As Directed 7)  Promethazine-Dm 6.25-15 Mg/29ml Syrp (Promethazine-Dm) .... 5-10 Ml By Mouth Qid As Needed For Cough  Current Medications (verified): 1)  Simvastatin 80 Mg Tabs (Simvastatin) .... Take 1 Tablet By Mouth Once A Day 2)  Fish Oil   Oil (Fish Oil) .... Tab By Mouth Once Daily 3)  Vitamin B-12 500 Mcg Tabs (Cyanocobalamin) .Marland Kitchen.. 1 Tab By Mouth Once Daily 4)  Vitamin D 1000 Unit Tabs (Cholecalciferol) .Marland Kitchen.. 1 Tab By Mouth Once Daily 5)  Aspirin 81 Mg  Tabs (Aspirin) .... Tab By Mouth Once Daily 6)  Nitrolingual 0.4 Mg/spray Soln (Nitroglycerin) .... Use As Directed 7)  Tussicaps 10-8 Mg Xr12h-Cap (Hydrocod Polst-Chlorphen Polst) .... One By Mouth Two Times A Day As Needed For Cough 8)  Zithromax Tri-Pak 500 Mg Tab (Azithromycin) .... Take One By Mouth Once Daily For 3 Days  Allergies (verified): 1)  ! 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 Ladona Ridgel OSTEOPOROSIS (ICD-733.00)-Dr. 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   Risk Factors: Alcohol Use: <1 (05/01/2010) >5 drinks/d w/in last 3 months: no (05/01/2010) Exercise: no (03/06/2009)  Risk Factors: Smoking Status: quit (05/01/2010)  Family History: Reviewed history from 06/26/2009 and no changes required. Family History Hypertension Father had CABG and valve replacement at age 41 Mother with congestive heart failure  Social History:  Reviewed history from 06/26/2009 and no changes required. Occupation: Banking Divorced Alcohol use-yes Drug use-no Regular exercise-no Tobacco Use - Former.   Review of Systems       The patient complains of hoarseness.  The patient denies anorexia, fever, weight loss, weight gain, decreased hearing, chest pain, syncope, dyspnea on exertion, peripheral edema, headaches, hemoptysis, abdominal pain, hematuria, muscle weakness, suspicious skin lesions, enlarged lymph nodes, and angioedema.    Physical Exam  General:  Well-developed,well-nourished,in no acute distress; alert,appropriate and cooperative throughout examination Head:  Normocephalic and atraumatic without obvious abnormalities. No apparent alopecia or balding. Eyes:  No corneal or conjunctival inflammation noted. EOMI. Perrla. Funduscopic exam benign, without hemorrhages, exudates or papilledema. Vision grossly normal. Ears:  R ear normal and L ear normal.   Nose:  External nasal examination shows no deformity or inflammation. Nasal mucosa are pink and moist without lesions or exudates. Mouth:  Oral mucosa and oropharynx without lesions or exudates.  Teeth in good repair. Neck:  supple, full ROM, no masses, no thyromegaly, no thyroid nodules or tenderness, no JVD, normal carotid upstroke, and no carotid bruits.   Lungs:  normal respiratory effort,  no intercostal retractions, no accessory muscle use, normal breath sounds, no dullness, no fremitus, no crackles, and no wheezes.   Heart:  normal rate, regular rhythm, no murmur, no gallop, no rub, and no JVD.   Abdomen:  soft, non-tender, normal bowel sounds, no distention, no masses, no guarding, no rigidity, no rebound tenderness, no abdominal hernia, no inguinal hernia, no hepatomegaly, and no splenomegaly.   Msk:  normal ROM, no joint tenderness, no joint swelling, no joint warmth, no redness over joints, no joint deformities, no joint instability, and no crepitation.   Pulses:  R and L carotid,radial,femoral,dorsalis pedis and posterior tibial pulses are full and equal bilaterally Extremities:  No clubbing, cyanosis, edema, or deformity noted with normal full range of motion of all joints.   Neurologic:  No cranial nerve deficits noted. Station and gait are normal. Plantar reflexes are down-going bilaterally. DTRs are symmetrical throughout. Sensory, motor and coordinative functions appear intact. Skin:  turgor normal, color normal, no rashes, no suspicious lesions, no ecchymoses, no petechiae, no purpura, no ulcerations, and no edema.   Cervical Nodes:  No lymphadenopathy noted Axillary Nodes:  No palpable lymphadenopathy Psych:  Cognition and judgment appear intact. Alert and cooperative with normal attention span and concentration. No apparent delusions, illusions, hallucinations   Impression & Recommendations:  Problem # 1:  BRONCHITIS-ACUTE (ICD-466.0) Assessment New will treat empirically for typicals and atypicals The following medications were removed from the medication list:    Promethazine-dm 6.25-15 Mg/37ml Syrp (Promethazine-dm) .Marland Kitchen... 5-10 ml by mouth qid as needed for cough Her updated medication list for this problem includes:    Tussicaps 10-8 Mg Xr12h-cap (Hydrocod polst-chlorphen polst) ..... One by mouth two times a day as needed for cough    Zithromax Tri-pak 500 Mg  Tab (Azithromycin) .Marland Kitchen... Take one by mouth once daily for 3 days  Take antibiotics and other medications as directed. Encouraged to push clear liquids, get enough rest, and take acetaminophen as needed. To be seen in 5-7 days if no improvement, sooner if worse.  Complete Medication List: 1)  Simvastatin 80 Mg Tabs (Simvastatin) .... Take 1 tablet by mouth once a day 2)  Fish Oil Oil (Fish oil) .... Tab by mouth once daily 3)  Vitamin B-12 500 Mcg Tabs (Cyanocobalamin) .Marland Kitchen.. 1 tab by mouth once daily 4)  Vitamin  D 1000 Unit Tabs (Cholecalciferol) .Marland Kitchen.. 1 tab by mouth once daily 5)  Aspirin 81 Mg Tabs (Aspirin) .... Tab by mouth once daily 6)  Nitrolingual 0.4 Mg/spray Soln (Nitroglycerin) .... Use as directed 7)  Tussicaps 10-8 Mg Xr12h-cap (Hydrocod polst-chlorphen polst) .... One by mouth two times a day as needed for cough 8)  Zithromax Tri-pak 500 Mg Tab (Azithromycin) .... Take one by mouth once daily for 3 days  Patient Instructions: 1)  Please schedule a follow-up appointment in 1 month. 2)  Take your antibiotic as prescribed until ALL of it is gone, but stop if you develop a rash or swelling and contact our office as soon as possible. 3)  Acute bronchitis symptoms for less than 10 days are not helped by antibiotics. take over the counter cough medications. call if no improvment in  5-7 days, sooner if increasing cough, fever, or new symptoms( shortness of breath, chest pain). Prescriptions: ZITHROMAX TRI-PAK 500 MG TAB (AZITHROMYCIN) Take one by mouth once daily for 3 days  #3 x 0   Entered and Authorized by:   Etta Grandchild MD   Signed by:   Etta Grandchild MD on 05/01/2010   Method used:   Print then Give to Patient   RxID:   [id number] TUSSICAPS 10-8 MG XR12H-CAP (HYDROCOD POLST-CHLORPHEN POLST) One by mouth two times a day as needed for cough  #30 x 0   Entered and Authorized by:   Etta Grandchild MD   Signed by:   Etta Grandchild MD on 05/01/2010   Method used:   Print  then Give to Patient   RxID:   [id number]    Orders Added: 1)  Est. Patient Level IV [01027]

## 2010-05-28 NOTE — Assessment & Plan Note (Signed)
Summary: murmur/saf   Primary Provider:  Etta Grandchild MD  CC:  referal from Dr Yetta Barre..  History of Present Illness: 65 year old female for evaluation of murmur. The patient has a history of coronary disease by cardiac catheterization in 2005. At that time she had an ejection fraction of 70%. There was a 50-60% LAD and no other obstructive disease noted. She had ablation of SVT at that time. She has since been followed at [facility] cardiology for a murmur with yearly echocardiograms. I do not have those records available. She is transferring here secondary to insurance problems. She denies any dyspnea on exertion, orthopnea, PND, pedal edema, palpitations, syncope or chest pain.  Preventive Screening-Counseling & Management  Alcohol-Tobacco     Smoking Status: quit  Current Medications (verified): 1)  Simvastatin 80 Mg Tabs (Simvastatin) .... Take 1 Tablet By Mouth Once A Day 2)  Alendronate Sodium 70 Mg Tabs (Alendronate Sodium) .... Weekly 3)  Fish Oil   Oil (Fish Oil) .... Tab By Mouth Once Daily 4)  Vitamin B-12 500 Mcg Tabs (Cyanocobalamin) .Marland Kitchen.. 1 Tab By Mouth Once Daily 5)  Vitamin D 1000 Unit Tabs (Cholecalciferol) .Marland Kitchen.. 1 Tab By Mouth Once Daily 6)  Aspirin 81 Mg  Tabs (Aspirin) .... 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 Ladona Ridgel OSTEOPOROSIS (ICD-733.00)-Dr. 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 Family History Hypertension Father had CABG and valve replacement at age 28 Mother with congestive heart failure  Social History: Reviewed history from 03/06/2009 and no changes required. Occupation: Banking Divorced Alcohol use-yes Drug  use-no Regular exercise-no Tobacco Use - Former.   Review of Systems       no fevers or chills, productive cough, hemoptysis, dysphasia, odynophagia, melena, hematochezia, dysuria, hematuria, rash, seizure activity, orthopnea, PND, pedal edema, claudication. Remaining systems are negative.   Vital Signs:  Patient profile:   65 year old female Height:      61 inches Weight:      141 pounds BMI:     26.74 Pulse rate:   86 / minute Resp:     14 per minute BP sitting:   120 / 69  (left arm)  Vitals Entered By: Kem Parkinson (June 26, 2009 3:38 PM)  Physical Exam  General:  Well developed/well nourished in NAD Skin warm/dry Patient not depressed No peripheral clubbing Back-normal HEENT-normal/normal eyelids Neck supple/normal carotid upstroke bilaterally; no bruits; no JVD; no thyromegaly chest - CTA/ normal expansion CV - RRR/normal S1 and S2; no  rubs or gallops;  2/6 systolic murmur at the left sternal border that radiates to the carotids. PMI nondisplaced Abdomen -NT/ND, no HSM, no mass, + bowel sounds, no bruit 2+ femoral pulses, no bruits Ext-no edema, chords, 2+ DP Neuro-grossly nonfocal     EKG  Procedure date:  06/26/2009  Findings:      Normal sinus rhythm at a rate of 86. No ST changes.  Impression & Recommendations:  Problem # 1:  SUPRAVENTRICULAR TACHYCARDIA (ICD-427.89) Status post ablation. No recurrences. Her updated medication list for this problem includes:    Aspirin 81 Mg Tabs (Aspirin) .Marland Kitchen... Tab by mouth once daily  Problem # 2:  HEART MURMUR, SYSTOLIC (ICD-785.2) Patient sounds to have aortic stenosis on examination. I will schedule  an echocardiogram to further assess. I will obtain all records from [facility] for review. Orders: Echocardiogram (Echo)  Problem # 3:  CAD (ICD-414.00) Patient has a history of a 50-60% LAD. Continue aspirin and statin. No recent chest pain. Her updated medication list for this problem  includes:    Aspirin 81 Mg Tabs (Aspirin) .Marland Kitchen... Tab by mouth once daily  Problem # 4:  HYPERLIPIDEMIA (ICD-272.4) Continue statin. Lipids and liver monitored by primary care. Her updated medication list for this problem includes:    Simvastatin 80 Mg Tabs (Simvastatin) .Marland Kitchen... Take 1 tablet by mouth once a day  Patient Instructions: 1)  Your physician recommends that you schedule a follow-up appointment in: ONE YEAR 2)  Your physician has requested that you have an echocardiogram.  Echocardiography is a painless test that uses sound waves to create images of your heart. It provides your doctor with information about the size and shape of your heart and how well your heart's chambers and valves are working.  This procedure takes approximately one hour. There are no restrictions for this procedure.

## 2010-05-28 NOTE — Letter (Signed)
Summary: Results Follow-up Letter  [facility]  [address] [place], [place] [address]   Phone: [phone]  Fax: [phone]    01/18/2010  [address] [place], [place]  [address]  Dear Ms. URIZAR,   The following are the results of your recent test(s):  Test     Result     BMD       severe osteoporosis   _________________________________________________________  Please call for an appointment soon _________________________________________________________ _________________________________________________________ _________________________________________________________  Sincerely,  Sanda Linger MD [facility]

## 2010-05-28 NOTE — Letter (Signed)
Summary: [facility]  [facility]   Imported By: Lester [place] 02/25/2010 09:20:48  _____________________________________________________________________  External Attachment:    Type:   Image     Comment:   External Document

## 2010-05-28 NOTE — Medication Information (Signed)
Summary: [facility]   Imported By: Lester [place] 08/08/2009 08:08:54  _____________________________________________________________________  External Attachment:    Type:   Image     Comment:   External Document

## 2010-05-28 NOTE — Miscellaneous (Signed)
Summary: BONE DENSITY  Clinical Lists Changes  Orders: Added new Test order of T-Lumbar Vertebral Assessment (77082) - Signed 

## 2010-06-19 ENCOUNTER — Telehealth: Payer: Self-pay | Admitting: Internal Medicine

## 2010-06-23 NOTE — Progress Notes (Signed)
  Phone Note Refill Request Message from:  Fax from Pharmacy on June 19, 2010 3:19 PM  Refills Requested: Medication #1:  NITROLINGUAL 0.4 MG/SPRAY SOLN use as directed pt lost her spray  Initial call taken by: Ami Bullins CMA,  June 19, 2010 3:19 PM    Prescriptions: NITROLINGUAL 0.4 MG/SPRAY SOLN (NITROGLYCERIN) use as directed  #4.9 g x 11   Entered by:   Ami Bullins CMA   Authorized by:   Etta Grandchild MD   Signed by:   Bill Salinas CMA on 06/19/2010   Method used:   Electronically to        [facility] (retail)       [address]       [place], [place]  [address]       Ph: [phone] or [phone]       Fax: [phone]   RxID:   [id number]

## 2010-08-28 ENCOUNTER — Other Ambulatory Visit (INDEPENDENT_AMBULATORY_CARE_PROVIDER_SITE_OTHER): Payer: Private Health Insurance - Indemnity

## 2010-08-28 ENCOUNTER — Encounter: Payer: Self-pay | Admitting: Internal Medicine

## 2010-08-28 ENCOUNTER — Other Ambulatory Visit (INDEPENDENT_AMBULATORY_CARE_PROVIDER_SITE_OTHER): Payer: Private Health Insurance - Indemnity | Admitting: Internal Medicine

## 2010-08-28 ENCOUNTER — Ambulatory Visit (INDEPENDENT_AMBULATORY_CARE_PROVIDER_SITE_OTHER): Payer: Private Health Insurance - Indemnity | Admitting: Internal Medicine

## 2010-08-28 VITALS — BP 120/78 | HR 98 | Temp 98.4°F | Resp 16

## 2010-08-28 DIAGNOSIS — E785 Hyperlipidemia, unspecified: Secondary | ICD-10-CM

## 2010-08-28 DIAGNOSIS — R531 Weakness: Secondary | ICD-10-CM

## 2010-08-28 DIAGNOSIS — R5381 Other malaise: Secondary | ICD-10-CM

## 2010-08-28 DIAGNOSIS — R011 Cardiac murmur, unspecified: Secondary | ICD-10-CM

## 2010-08-28 DIAGNOSIS — R5383 Other fatigue: Secondary | ICD-10-CM

## 2010-08-28 DIAGNOSIS — M810 Age-related osteoporosis without current pathological fracture: Secondary | ICD-10-CM

## 2010-08-28 DIAGNOSIS — Z13220 Encounter for screening for lipoid disorders: Secondary | ICD-10-CM

## 2010-08-28 DIAGNOSIS — I2510 Atherosclerotic heart disease of native coronary artery without angina pectoris: Secondary | ICD-10-CM

## 2010-08-28 LAB — COMPREHENSIVE METABOLIC PANEL
AST: 58 U/L — ABNORMAL HIGH (ref 0–37)
Albumin: 3.8 g/dL (ref 3.5–5.2)
Alkaline Phosphatase: 81 U/L (ref 39–117)
Potassium: 4.8 mEq/L (ref 3.5–5.1)
Sodium: 139 mEq/L (ref 135–145)
Total Bilirubin: 0.5 mg/dL (ref 0.3–1.2)
Total Protein: 7.5 g/dL (ref 6.0–8.3)

## 2010-08-28 LAB — LDL CHOLESTEROL, DIRECT: Direct LDL: 150.9 mg/dL

## 2010-08-28 LAB — LIPID PANEL
Cholesterol: 272 mg/dL — ABNORMAL HIGH (ref 0–200)
HDL: 73.6 mg/dL (ref >39.00)
Triglycerides: 292 mg/dL — ABNORMAL HIGH (ref 0.0–149.0)

## 2010-08-28 LAB — CBC WITH DIFFERENTIAL/PLATELET
Eosinophils Absolute: 0.1 10*3/uL (ref 0.0–0.7)
MCHC: 35.1 g/dL (ref 30.0–36.0)
MCV: 91 fl (ref 78.0–100.0)
Monocytes Absolute: 0.6 10*3/uL (ref 0.1–1.0)
Neutrophils Relative %: 53.5 % (ref 43.0–77.0)
Platelets: 300 10*3/uL (ref 150.0–400.0)
RDW: 12.5 % (ref 11.5–14.6)

## 2010-08-28 LAB — CK: Total CK: 104 U/L (ref 7–177)

## 2010-08-28 NOTE — Patient Instructions (Signed)

## 2010-08-28 NOTE — Progress Notes (Signed)
Subjective:    Patient ID: Carrie [name], female    DOB: February 22, 1946, 65 y.o.   MRN: [id number]  HPI She returns for f/up and tells me that she had a brief episode of weakness located around her knees about one week ago but she has felt well since then. She did not have any pain or heart symptoms.   Review of Systems  Constitutional: Negative for fever, chills, diaphoresis, activity change, appetite change, fatigue and unexpected weight change.  HENT: Negative for sore throat, facial swelling, trouble swallowing, neck pain, neck stiffness and voice change.   Respiratory: Negative for cough, chest tightness, shortness of breath, wheezing and stridor.   Cardiovascular: Negative for chest pain, palpitations and leg swelling.  Gastrointestinal: Negative for nausea, vomiting, abdominal pain, diarrhea, constipation, blood in stool and abdominal distention.  Genitourinary: Negative for dysuria, urgency, frequency, hematuria, flank pain, decreased urine volume, enuresis, difficulty urinating and dyspareunia.  Musculoskeletal: Negative for back pain, joint swelling, arthralgias and gait problem.  Neurological: Positive for weakness. Negative for dizziness, tremors, seizures, syncope, facial asymmetry, speech difficulty, light-headedness, numbness and headaches.  Hematological: Negative for adenopathy. Does not bruise/bleed easily.  Psychiatric/Behavioral: Negative.        Objective:   Physical Exam  Constitutional: She is oriented to person, place, and time. She appears well-developed and well-nourished. No distress.  HENT:  Head: Normocephalic and atraumatic.  Right Ear: External ear normal.  Left Ear: External ear normal.  Nose: Nose normal.  Mouth/Throat: Oropharynx is clear and moist. No oropharyngeal exudate.  Eyes: Conjunctivae and EOM are normal. Pupils are equal, round, and reactive to light. Right eye exhibits no discharge. Left eye exhibits no discharge. No scleral icterus.  Neck:  Normal range of motion. Neck supple. No JVD present. No tracheal deviation present. No thyromegaly present.  Cardiovascular: Normal rate, regular rhythm, S1 normal, S2 normal, intact distal pulses and normal pulses.   No extrasystoles are present. PMI is not displaced.  Exam reveals no gallop, no S3, no S4, no distant heart sounds, no friction rub and no decreased pulses.   Murmur heard.  Decrescendo systolic murmur is present with a grade of 1/6   No diastolic murmur is present  Pulmonary/Chest: Effort normal and breath sounds normal. No stridor. No respiratory distress. She has no wheezes. She has no rales. She exhibits no tenderness.  Abdominal: Soft. Bowel sounds are normal. She exhibits no distension. There is no tenderness. There is no rebound.  Musculoskeletal: Normal range of motion. She exhibits no edema.  Lymphadenopathy:    She has no cervical adenopathy.  Neurological: She is alert and oriented to person, place, and time. She has normal reflexes. She displays normal reflexes. No cranial nerve deficit. She exhibits normal muscle tone. Coordination normal.  Skin: Skin is warm and dry. No rash noted. She is not diaphoretic. No erythema. No pallor.  Psychiatric: She has a normal mood and affect. Her behavior is normal. Judgment and thought content normal.        Lab Results  Component Value Date   WBC 7.5 01/30/2010   HGB 13.9 01/30/2010   HCT 40.3 01/30/2010   PLT 299.0 01/30/2010   CHOL 222* 03/06/2009   TRIG 83.0 03/06/2009   HDL 74.00 03/06/2009   LDLDIRECT 124.5 03/06/2009   ALT 21 01/30/2010   AST 20 01/30/2010   NA 140 01/30/2010   K 4.6 01/30/2010   CL 103 01/30/2010   CREATININE 0.7 01/30/2010   BUN 23 01/30/2010  CO2 30 01/30/2010   TSH 1.00 01/30/2010    Assessment & Plan:

## 2010-08-30 ENCOUNTER — Encounter: Payer: Self-pay | Admitting: Internal Medicine

## 2010-08-30 LAB — VITAMIN D 1,25 DIHYDROXY
Vitamin D 1, 25 (OH)2 Total: 60 pg/mL (ref 18–72)
Vitamin D2 1, 25 (OH)2: <8 pg/mL

## 2010-08-30 NOTE — Assessment & Plan Note (Signed)
I will check her FLP, TSH, CMP today

## 2010-08-30 NOTE — Assessment & Plan Note (Signed)
This was a brief complaint and has resolved, today I will check labs to look for secondary causes

## 2010-08-30 NOTE — Assessment & Plan Note (Signed)
She has no s/s today 

## 2010-09-04 ENCOUNTER — Encounter: Payer: Self-pay | Admitting: Cardiology

## 2010-09-04 ENCOUNTER — Ambulatory Visit (INDEPENDENT_AMBULATORY_CARE_PROVIDER_SITE_OTHER): Payer: Private Health Insurance - Indemnity | Admitting: Cardiology

## 2010-09-04 VITALS — BP 152/85 | HR 63 | Resp 18 | Ht 62.0 in | Wt 145.1 lb

## 2010-09-04 DIAGNOSIS — E7800 Pure hypercholesterolemia, unspecified: Secondary | ICD-10-CM

## 2010-09-04 DIAGNOSIS — I498 Other specified cardiac arrhythmias: Secondary | ICD-10-CM

## 2010-09-04 DIAGNOSIS — I350 Nonrheumatic aortic (valve) stenosis: Secondary | ICD-10-CM

## 2010-09-04 DIAGNOSIS — I359 Nonrheumatic aortic valve disorder, unspecified: Secondary | ICD-10-CM

## 2010-09-04 DIAGNOSIS — I2510 Atherosclerotic heart disease of native coronary artery without angina pectoris: Secondary | ICD-10-CM

## 2010-09-04 DIAGNOSIS — E785 Hyperlipidemia, unspecified: Secondary | ICD-10-CM

## 2010-09-04 MED ORDER — ATORVASTATIN CALCIUM 40 MG PO TABS: 40.0000 mg | ORAL_TABLET | Freq: Every day | ORAL | Status: DC

## 2010-09-04 NOTE — Patient Instructions (Signed)
Stop Simvastatin (Zocor).  Start Atorvastatin(lipitor) 40mg  daily.  Fasting lab in 2 months--lipid profile/liver profile 272.0  424.1  414.01  Schedule an appointment for an echocardiogram.  Your physician wants you to follow-up in: 1 year with Dr Shirlee Latch. (May 2013).You will receive a reminder letter in the mail two months in advance. If you don't receive a letter, please call our office to schedule the follow-up appointment.

## 2010-09-05 DIAGNOSIS — I350 Nonrheumatic aortic (valve) stenosis: Secondary | ICD-10-CM | POA: Insufficient documentation

## 2010-09-05 NOTE — Assessment & Plan Note (Signed)
I suspect that the patient's "weak spell," as mentioned above, was due to a vagal event rather than recurrent arrhythmia.  If it recurs, would consider an event monitor.

## 2010-09-05 NOTE — Assessment & Plan Note (Signed)
Nonobstructive CAD on 2005 cath.  No ischemic symptoms.  I think her "weak spell" 2 weeks ago may have been a vagal event (she had been on her feet for at least an hour when this happened).  Continue ASA and statin.

## 2010-09-05 NOTE — Assessment & Plan Note (Signed)
Known CAD.  Goal LDL < 70.  Stop simvastatin and start atorvastatin 40 mg daily.  Lipids/LFTs in 2 months.

## 2010-09-05 NOTE — Progress Notes (Signed)
PCP: Dr. Yetta Barre  65 yo with history of nonobstructive CAD, SVT s/p ablation, and mild aortic stenosis presents for cardiology followup.  Patient has been seen by Dr. Jens Som in the past, I am seeing her for the first time today. Patient has been doing well except for a "weak spell" about 2 weeks ago.  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  She had been on her feet for about an hour.  Her legs began to feel weak like they were going to buckle and she had to go sit down.  She did not have any tachypalpitations or lightheadedness associated with this (felt her heart racing and felt lightheaded with prior SVT).  She has not had any "weak spells" like this since.  No episodes of heart racing, lightheadedness or syncope.  No chest pain.  She is able to walk up steps and do heavy yardwork without exertional dyspnea.  BP is high today but has always been normal at prior doctors' visits.   ECG: NSR, normal  Labs (5/12): LDL 151, HDL 74, K 4.8, creatinine 0.7, TSH normal  PMH:  1. CAD: LHC 2005 with 50-60% mid LAD stenosis.  ETT-myoview (6/10): Exercised 10 minutes, ST depression with exercise, no ischemia or infarction on perfusion images.   2. SVT ablation 2005 3. Aortic stenosis: Mild.  Echo (3/11) with EF 55-60%, trileaflet aortic valve with mild AS with mean gradient 15 mmHg, mild MR.  4. Hyperlipidemia 5. Osteoporosis.  FH: HTN.  Father with CABG + AVR at age 22.  Mother with CHF.   SH: Divorced.  Nonsmoker.  Lives in [facility].  Works part time at [employer] and full time at [employer] [place].   ROS: All systems reviewed and negative except as per HPI.   Current Outpatient Prescriptions  Medication Sig Dispense Refill  . alendronate (FOSAMAX) 70 MG tablet Take 70 mg by mouth every 7 (seven) days. Take with a full glass of water on an empty stomach.       Marland Kitchen aspirin 81 MG tablet Take 81 mg by mouth daily.        . Cholecalciferol (VITAMIN D3) 1000 UNITS CAPS Take 1 capsule by  mouth daily.        . fish oil-omega-3 fatty acids 1000 MG capsule Take 1 g by mouth daily.        . Thiamine HCl (VITAMIN B-1) 250 MG tablet Take 500 mg by mouth daily.        Marland Kitchen atorvastatin (LIPITOR) 40 MG tablet Take 1 tablet (40 mg total) by mouth daily.  30 tablet  3  . nitroGLYCERIN (NITROLINGUAL) 0.4 MG/SPRAY spray Place 1 spray under the tongue as directed.          BP 152/85  Pulse 63  Resp 18  Ht 5\' 2"  (1.575 m)  Wt 145 lb 1.9 oz (65.826 kg)  BMI 26.54 kg/m2 General: NAD Neck: No JVD, no thyromegaly or thyroid nodule.  Lungs: Clear to auscultation bilaterally with normal respiratory effort. CV: Nondisplaced PMI.  Heart regular S1/S2, no S3/S4, 2/6 systolic crescendo-decrescendo murmur RUSB, S2 heard clearly.  No peripheral edema.  No carotid bruit.  Normal pedal pulses.  Abdomen: Soft, nontender, no hepatosplenomegaly, no distention.  Neurologic: Alert and oriented x 3.  Psych: Normal affect. Extremities: No clubbing or cyanosis.

## 2010-09-05 NOTE — Assessment & Plan Note (Signed)
Will get echo to reassess aortic stenosis.

## 2010-09-11 NOTE — Op Note (Signed)
NAME:  Carrie [name], Carrie [name]                        ACCOUNT NO.:  [id number]   MEDICAL RECORD NO.:  [id number]                   PATIENT TYPE:  INP   LOCATION:  2901                                 FACILITY:  [facility]   PHYSICIAN:  Doylene Canning. Ladona Ridgel, M.D.               DATE OF BIRTH:  01/24/1946   DATE OF PROCEDURE:  07/24/2003  DATE OF DISCHARGE:                                 OPERATIVE REPORT   PROCEDURE PERFORMED:  Electrophysiology study and RF catheter ablation of a  left lateral accessory pathway in a patient with documented recurrent SVT  with syncope and near syncope.   I:  INTRODUCTION:  The patient is a 65 year old woman with a long history of  tachy palpitations.  She was recently found to have documented SVT.  She  underwent initial EP testing and her procedure was complicated by  hypotension and a small enzyme bump of unclear significance.  She  stabilized.  She underwent left heart catheterization which demonstrated  mild LAD disease but no major occlusion of any epicardial vessels.  She had  a 50% stenosis of the LAD with calcification in both the right coronary and  left coronary systems.  Her LV function was normal.  There were no wall  motion abnormalities.  She is now referred for electrophysiologic study and  catheter ablation.   II:  PROCEDURE:  After informed consent was obtained, the patient was taken  to the diagnostic EP lab in a fasting state.  After the usual preparation  and draping, intravenous Fentanyl and Midazolam were given for sedation.  A  6 French hexapolar catheter was inserted percutaneously in the right jugular  vein and advanced to the coronary sinus.  A 7 French quadripolar ablation  catheter was inserted percutaneously in the right femoral artery and  advanced retrograde across the aortic valve in the left ventricle.  A 5  French quadripolar catheter was inserted percutaneously in the right femoral  vein and advanced to the RV apex and a 5 French  quadripolar catheter was  inserted percutaneously in the right femoral vein and advanced to the His  bundle region.  Because prior electrophysiologic study and testing had been  done under the direction of Dr. Graciela Husbands as previously noted, ventricular  pacing was carried out at a pacing cycle length of 500 milliseconds.  This  demonstrated eccentric atrial activation by way of the accessory pathway.  The ablation catheter was maneuvered onto the atrial insertion of the  accessory pathway.  Of note, getting the ablation catheter into the left  atrium and along the mitral annulus was somewhat more difficulty than usual  secondary to the patient's unusual anatomy.  After multiple attempts,  however, the ablation catheter was positioned on the accessory pathway.  A  total of 5 RF energy applications were delivered.  During the fourth RF  energy application, there was [place] dissociation transiently, but [place] conduction  returned.  Following this on the last RF energy application which was  carried out for approximately 90 seconds, the VA conduction was terminated  and did not return.  The patient was observed for approximately 30 minutes  after ablation.  During this time, rapid ventricular pacing was carried out  from the RV apex at a pacing cycle length of 600 milliseconds demonstrating  VA dissociation.  Programmed atrial stimulation was carried out from the  coronary sinus as well as the high right atrium at pacing cycle lengths of  509 milliseconds and step wise decreased down to 450 milliseconds resulting  in AV Wenckebach.  Finally, programmed atrial stimulation was carried out  from the coronary sinus at a paced cycle length of 500 milliseconds.  The S1-  S2 interval was stepwise decreased from 440 milliseconds down to 320  milliseconds where AV node ARP was observed.  During programmed atrial  stimulation, there were no AH jumps.  There was nonsustained atrial flutter  that was induced which  terminated within seconds.  Because this was not her  clinical arrhythmia and because of successful catheter ablation, the atrial  flutter was not targeted from mapping or ablation.  At this point, the  catheters were removed, hemostasis was assured, and the patient was returned  to her room in satisfactory condition.   III:  COMPLICATIONS:  There were no immediate procedure complications.   IV:  RESULTS:  A.  Baseline ECG.  The baseline ECG demonstrates normal sinus rhythm with  normal axis and intervals.  There was on ventricular pre-excitation.  B.  Baseline intervals.  The sinus node cycle length was 818 milliseconds.  The HP interval was 42 milliseconds.  The QRS duration 100 milliseconds.  Following ablation, there was no significant change in these intervals.  C.  Rapid ventricular pacing.  Rapid ventricular pacing prior to ablation  demonstrated lateral atrial activation, earliest approximately 3 o'clock on  the mitral annulus.  Following catheter ablation, there was VA dissociation.  D.  Rapid atrial pacing.  Rapid atrial pacing following catheter ablation  demonstrated an AV Wenckebach cycle length of 450 milliseconds.  During  rapid atrial pacing, there was no inducible SVT.  E.  Programmed atrial stimulation.  Programmed atrial stimulation was  carried out after catheter ablation at a paced cycle length of 500  milliseconds.  The S1 and S2 interval was stepwise decreased from 440  milliseconds down to 320 milliseconds where an AV node ARP was observed.  During programmed atrial stimulation, there was nonsustained atrial flutter  induced, but with no other evidence of inducible SVT.  Of note, the atrial  flutter was nonsustained and spontaneously terminated.  It was associated  with a slow ventricular response.  F.  Arrhythmias observed:  During this procedure, there was no SVT observed.  During the patient's prior procedure, SVT was induced with ventricular pacing during  isoproterenol infusion.  G.  Mapping:  Mapping of the patient's accessory pathway demonstrated a far  left lateral accessory pathway at a position approximately 3 to 4 o'clock on  the mitral annulus.  H.  RF energy application.  A total of six RF energy applications were  delivered.  During the final RF energy application, the patient's accessory  pathway conduction terminated and VA dissociation was observed with  ventricular pacing.   V:  CONCLUSION:  The study demonstrates successful catheter ablation of far  left lateral accessory pathway resulting in the initiation of SVT.  Following catheter ablation,  there was no inducible SVT and no evidence of  any residual accessory pathway conduction.                                               Doylene Canning. Ladona Ridgel, M.D.    GWT/MEDQ  D:  07/24/2003  T:  07/24/2003  Job:  [id number]   cc:   Candace Cruise, M.D.   Dario Guardian, M.D.  [address]  [place]  [place] [address]  Fax: [phone]   Duke Salvia, M.D.

## 2010-09-11 NOTE — Discharge Summary (Signed)
NAME:  Carrie [name], Carrie [name]                        ACCOUNT NO.:  [id number]   MEDICAL RECORD NO.:  [id number]                   PATIENT TYPE:  INP   LOCATION:  3712                                 FACILITY:  [facility]   PHYSICIAN:  Chinita Pester, C.R.N.P. [facility]           DATE OF BIRTH:  Mar 26, 1946   DATE OF ADMISSION:  07/22/2003  DATE OF DISCHARGE:  07/25/2003                                 DISCHARGE SUMMARY   PRIMARY DISCHARGE DIAGNOSIS:  Supraventricular tachycardia.   SECONDARY DIAGNOSES:  1. Osteoporosis.  2. Chest pain.   HISTORY OF PRESENT ILLNESS:  This is a 65 year old female with past medical  history of osteoporosis, who was seen by Duke Salvia, M.D. in the office  for evaluation and admitted for SVT ablation.   PAST MEDICAL HISTORY:  Positive for osteoporosis.   SOCIAL HISTORY:  Quit tobacco one year ago.  Denies alcohol or drugs.   ALLERGIES:  LATEX.   HOSPITAL COURSE:  The patient was admitted and had a concealed left lateral  accessory pathway.  Procedure was aborted secondary to back pain and  hypotension.  The patient then had a consult placed with [facility]  and her primary Cardiologist, Meade Maw, M.D. who had seen the patient.  The patient had slight increase in her cardiac enzymes which were 86 with an  MB of 5.2, troponin 0.69; second set, CK 92, MB 8 with a troponin of 1.20;  third set, CK 75, MB 4.4, troponin 0.54.  The patient was taken for cardiac  catheterization by Salvadore Farber, M.D. [facility].  Cardiac catheterization  showed nonobstructive coronary disease with modest calcium.  She then,  underwent SVT ablation and tolerated the procedure well.  She had no  postoperative complications.  She had SVT secondary to her concealed left  lateral accessory pathway, status post successful EP study wave frequency  ablation.  Nonobstructive coronary disease with modest calcium.   The patient was discharged to home in stable condition on the following  medications:   DISCHARGE MEDICATIONS:  1. Coated aspirin 325 a day.  2. Antibiotic prophylaxis.  3. Fosamax as before.  4. Tylenol 1-2 tablets q.4-6h. as needed for pain.   DISCHARGE INSTRUCTIONS:  No heavy lifting or strenuous activity for four  days.  No driving for two days.  Low fat, low salt, low cholesterol diet.  She is to call if she develops a lump or any drainage in her neck or groin.  She is to call [place] office, [phone].   FOLLOWUP:  A followup appointment was scheduled with Meade Maw, M.D. on  August 13, 2003 at 3 o'clock and Doylene Canning. Ladona Ridgel, M.D. on Sep 05, 2003 at  9:45.  Chinita Pester, C.R.N.P. [facility]    DS/MEDQ  D:  07/25/2003  T:  07/26/2003  Job:  [id number]   cc:   Doylene Canning. Ladona Ridgel, M.D.   Duke Salvia, M.D.   Meade Maw, M.D.  [address]  [place]  [place] [address]  Fax: [phone]   Dario Guardian, M.D.  [address]  [place]  [place] [address]  Fax: [phone]
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
## 2010-09-11 NOTE — Cardiovascular Report (Signed)
NAME:  RAI, SINAGRA                        ACCOUNT NO.:  [id number]   MEDICAL RECORD NO.:  [id number]                   PATIENT TYPE:  INP   LOCATION:  2901                                 FACILITY:  [facility]   PHYSICIAN:  Salvadore Farber, M.D. [facility]         DATE OF BIRTH:  [birth year]   DATE OF PROCEDURE:  07/24/2003  DATE OF DISCHARGE:                              CARDIAC CATHETERIZATION   PROCEDURES PERFORMED:  1. Left heart catheterization.  2. Left ventriculography.  3. Coronary angiography.   CARDIOLOGIST:  Salvadore Farber, M.D.   INDICATIONS:  Ms. Kunsman is a 65 year old lady without prior history of  atherosclerotic disease.  She had a normal stress  Cardiolite in December  2004 by report.  On July 22, 2003 she was undergoing left ventricular  mapping with the intent of ablation of left-sided supraventricular  tachycardia.  During that procedure she developed hypertension and chest  discomfort.  She subsequently developed troponin elevation with it peaking  at 1.2.  she has no had any recurrent pain.  Echocardiogram performed that  day showed normal left ventricular size and systolic function, and no  pericardial effusion.  The procedure was aborted. She has been without  recurrence of chest discomfort and blood pressure has been stable.  She is  now scheduled for a second attempt at SVT ablation.  I was asked to perform  coronary angiography to exclude severe coronary disease before that planned  ablation.   PROCEDURAL TECHNIQUE:  Informed consent was obtained.  Under 1% lidocaine  local anesthesia an 8 French sheath was placed in the right common femoral  artery using the modified Seldinger technique.  Diagnostic angiography and  ventriculography were performed using JL-4, JR-4 and pigtail catheters.   The patient tolerated this portion of the procedure well.  The patient  remained in the EP lab for he electrophysiology portion of the procedure to  be performed by  Dr. Ladona Ridgel.   COMPLICATIONS:  None.   FINDINGS:   HEMODYNAMIC DATA:  LV 150/5/12.  EF 70% without regional wall motion  abnormality.  No aortic stenosis or mitral regurgitation.   ANGIOGRAPHIC DATA:  1. Left Main:  The left main is heavily calcified throughout its length.  No     stenosis.   1. LAD:  The LAD is heavily calcified proximally.  It is a large vessel,     which wraps the apex of the heart and gives rise to a small first and     larger second diagonal branch.  Just after the takeoff of the second     diagonal there is a 50-60% stenosis.   1. Ramus Intermedius:  The ramus intermedius is a moderate-sized vessel,     which is angiographically normal.   1. Circumflex:  The circumflex is a moderate-sized vessel giving rise to a     single obtuse marginal.  The AV groove circumflex is calcified  proximally.  There are no stenoses.   1. RCA:  The RCA is a large dominant vessel.  It is moderately calcified     proximally.  There are minor luminal irregularities throughout its     course.   IMPRESSION AND RECOMMENDATIONS:  1. Moderate left anterior descending stenosis.  2. No disruption of the left main coronary.  3. Normal left ventricular size and systolic function.  4. Normal left ventricular end diastolic pressure.  5. No aortic stenosis or mitral regurgitation.   No severe coronary disease.  Suggest initiation of medical therapy for the  documented atherosclerosis.                                               Salvadore Farber, M.D. [facility]    WED/MEDQ  D:  07/24/2003  T:  07/25/2003  Job:  [id number]   cc:   Doylene Canning. Ladona Ridgel, M.D.   Duke Salvia, M.D.   Meade Maw, M.D.  [address]  [place]  [place] [address]  Fax: [phone]

## 2010-09-25 ENCOUNTER — Ambulatory Visit (HOSPITAL_COMMUNITY): Payer: Private Health Insurance - Indemnity | Attending: Cardiology | Admitting: Radiology

## 2010-09-25 DIAGNOSIS — E785 Hyperlipidemia, unspecified: Secondary | ICD-10-CM | POA: Insufficient documentation

## 2010-09-25 DIAGNOSIS — I080 Rheumatic disorders of both mitral and aortic valves: Secondary | ICD-10-CM | POA: Insufficient documentation

## 2010-09-25 DIAGNOSIS — I359 Nonrheumatic aortic valve disorder, unspecified: Secondary | ICD-10-CM

## 2010-11-02 ENCOUNTER — Other Ambulatory Visit: Payer: Self-pay | Admitting: Cardiology

## 2010-11-02 ENCOUNTER — Other Ambulatory Visit (INDEPENDENT_AMBULATORY_CARE_PROVIDER_SITE_OTHER): Payer: Private Health Insurance - Indemnity

## 2010-11-02 DIAGNOSIS — E7800 Pure hypercholesterolemia, unspecified: Secondary | ICD-10-CM

## 2010-11-02 DIAGNOSIS — I359 Nonrheumatic aortic valve disorder, unspecified: Secondary | ICD-10-CM

## 2010-11-02 DIAGNOSIS — I2510 Atherosclerotic heart disease of native coronary artery without angina pectoris: Secondary | ICD-10-CM

## 2010-11-02 LAB — HEPATIC FUNCTION PANEL
ALT: 34 U/L (ref 0–35)
AST: 35 U/L (ref 0–37)
Albumin: 4.4 g/dL (ref 3.5–5.2)
Alkaline Phosphatase: 78 U/L (ref 39–117)
Total Protein: 8.1 g/dL (ref 6.0–8.3)

## 2010-11-02 LAB — LDL CHOLESTEROL, DIRECT: Direct LDL: 119.4 mg/dL

## 2010-11-02 LAB — LIPID PANEL
Cholesterol: 213 mg/dL — ABNORMAL HIGH (ref 0–200)
Triglycerides: 86 mg/dL (ref 0.0–149.0)

## 2010-11-03 ENCOUNTER — Telehealth: Payer: Self-pay | Admitting: *Deleted

## 2010-11-03 DIAGNOSIS — I359 Nonrheumatic aortic valve disorder, unspecified: Secondary | ICD-10-CM

## 2010-11-03 DIAGNOSIS — E7800 Pure hypercholesterolemia, unspecified: Secondary | ICD-10-CM

## 2010-11-03 DIAGNOSIS — I2510 Atherosclerotic heart disease of native coronary artery without angina pectoris: Secondary | ICD-10-CM

## 2010-11-03 NOTE — Telephone Encounter (Signed)
otes Recorded by Jacqlyn Krauss, RN on 11/03/2010 at 5:03 PM I talked with pt. Pt states she has not been taking Lipitor 40mg  regularly--she has only been taking it about 3 times per week. She wants to take Lipitor 40mg  regularly daily before she increases it to 80mg  daily. She will return for fasting lipid/liver profile 01/06/11.

## 2011-01-02 ENCOUNTER — Other Ambulatory Visit: Payer: Self-pay | Admitting: Internal Medicine

## 2011-01-02 DIAGNOSIS — I359 Nonrheumatic aortic valve disorder, unspecified: Secondary | ICD-10-CM

## 2011-01-02 DIAGNOSIS — I2510 Atherosclerotic heart disease of native coronary artery without angina pectoris: Secondary | ICD-10-CM

## 2011-01-02 DIAGNOSIS — E7800 Pure hypercholesterolemia, unspecified: Secondary | ICD-10-CM

## 2011-01-06 ENCOUNTER — Ambulatory Visit (INDEPENDENT_AMBULATORY_CARE_PROVIDER_SITE_OTHER): Payer: Private Health Insurance - Indemnity | Admitting: Cardiology

## 2011-01-06 DIAGNOSIS — E7800 Pure hypercholesterolemia, unspecified: Secondary | ICD-10-CM

## 2011-01-06 DIAGNOSIS — I2510 Atherosclerotic heart disease of native coronary artery without angina pectoris: Secondary | ICD-10-CM

## 2011-01-06 DIAGNOSIS — I359 Nonrheumatic aortic valve disorder, unspecified: Secondary | ICD-10-CM

## 2011-01-06 LAB — HEPATIC FUNCTION PANEL
ALT: 22 U/L (ref 0–35)
Alkaline Phosphatase: 63 U/L (ref 39–117)
Bilirubin, Direct: 0.1 mg/dL (ref 0.0–0.3)
Total Bilirubin: 0.6 mg/dL (ref 0.3–1.2)
Total Protein: 7.3 g/dL (ref 6.0–8.3)

## 2011-01-06 LAB — LIPID PANEL
Cholesterol: 187 mg/dL (ref 0–200)
LDL Cholesterol: 93 mg/dL (ref 0–99)
VLDL: 17.4 mg/dL (ref 0.0–40.0)

## 2011-01-08 MED ORDER — ATORVASTATIN CALCIUM 80 MG PO TABS: 80.0000 mg | ORAL_TABLET | Freq: Every day | ORAL | Status: DC

## 2011-02-03 ENCOUNTER — Ambulatory Visit (INDEPENDENT_AMBULATORY_CARE_PROVIDER_SITE_OTHER): Payer: Private Health Insurance - Indemnity | Admitting: Internal Medicine

## 2011-02-03 ENCOUNTER — Encounter: Payer: Self-pay | Admitting: Internal Medicine

## 2011-02-03 VITALS — BP 132/84 | HR 80 | Temp 98.8°F | Resp 16 | Wt 143.5 lb

## 2011-02-03 DIAGNOSIS — J209 Acute bronchitis, unspecified: Secondary | ICD-10-CM

## 2011-02-03 MED ORDER — CEFUROXIME AXETIL 500 MG PO TABS: 500.0000 mg | ORAL_TABLET | Freq: Two times a day (BID) | ORAL | Status: AC

## 2011-02-03 MED ORDER — HYDROCOD POLST-CPM POLST ER 10-8 MG PO CP12: 1.0000 | ORAL_CAPSULE | Freq: Two times a day (BID) | ORAL | Status: DC | PRN

## 2011-02-03 NOTE — Patient Instructions (Signed)
Bronchitis Bronchitis is the body's way of reacting to injury and/or infection (inflammation) of the bronchi. Bronchi are the air tubes that extend from the windpipe into the lungs. If the inflammation becomes severe, it may cause shortness of breath.  CAUSES Inflammation may be caused by:  A virus.   Germs (bacteria).   Dust.   Allergens.   Pollutants and many other irritants.  The cells lining the bronchial tree are covered with tiny hairs (cilia). These constantly beat upward, away from the lungs, toward the mouth. This keeps the lungs free of pollutants. When these cells become too irritated and are unable to do their job, mucus begins to develop. This causes the characteristic cough of bronchitis. The cough clears the lungs when the cilia are unable to do their job. Without either of these protective mechanisms, the mucus would settle in the lungs. Then you would develop pneumonia. Smoking is a common cause of bronchitis and can contribute to pneumonia. Stopping this habit is the single most important thing you can do to help yourself. TREATMENT  Your caregiver may prescribe an antibiotic if the cough is caused by bacteria. Also, medicines that open up your airways make it easier to breathe. Your caregiver may also recommend or prescribe an expectorant. It will loosen the mucus to be coughed up. Only take over-the-counter or prescription medicines for pain, discomfort, or fever as directed by your caregiver.   Removing whatever causes the problem (smoking, for example) is critical to preventing the problem from getting worse.   Cough suppressants may be prescribed for relief of cough symptoms.   Inhaled medicines may be prescribed to help with symptoms now and to help prevent problems from returning.   For those with recurrent (chronic) bronchitis, there may be a need for steroid medicines.  SEEK IMMEDIATE MEDICAL CARE IF:  During treatment, you develop more pus-like mucus  (purulent sputum).   You or your child has an oral temperature above 100.5, not controlled by medicine.   Your baby is older than 3 months with a rectal temperature of 102 F (38.9 C) or higher.   Your baby is 3 months old or younger with a rectal temperature of 100.4 F (38 C) or higher.   You become progressively more ill.   You have increased difficulty breathing, wheezing, or shortness of breath.  It is necessary to seek immediate medical care if you are elderly or sick from any other disease. MAKE SURE YOU:  Understand these instructions.   Will watch your condition.   Will get help right away if you are not doing well or get worse.  Document Released: 04/12/2005 Document Re-Released: 07/07/2009 [employer] Patient Information 2011 [employer]. 

## 2011-02-03 NOTE — Assessment & Plan Note (Signed)
Start ceftin 

## 2011-02-03 NOTE — Progress Notes (Signed)
Subjective:    Patient ID: Carrie [name], female    DOB: 12-03-1945, 65 y.o.   MRN: [id number]  URI  This is a new problem. Episode onset: 5 days. The problem has been gradually worsening. There has been no fever. Associated symptoms include congestion, coughing (productive of yellow/green phlegm), rhinorrhea, sinus pain, sneezing and a sore throat. Pertinent negatives include no abdominal pain, chest pain, diarrhea, dysuria, ear pain, headaches, joint pain, joint swelling, nausea, neck pain, plugged ear sensation, rash, swollen glands, vomiting or wheezing. She has tried nothing for the symptoms. The treatment provided no relief.      Review of Systems  Constitutional: Negative for fever, chills, diaphoresis, activity change, appetite change, fatigue and unexpected weight change.  HENT: Positive for congestion, sore throat, rhinorrhea, sneezing, postnasal drip and sinus pressure. Negative for hearing loss, ear pain, nosebleeds, facial swelling, trouble swallowing, neck pain, neck stiffness, voice change, tinnitus and ear discharge.   Eyes: Negative.   Respiratory: Positive for cough (productive of yellow/green phlegm). Negative for apnea, choking, chest tightness, shortness of breath, wheezing and stridor.   Cardiovascular: Negative for chest pain, palpitations and leg swelling.  Gastrointestinal: Negative for nausea, vomiting, abdominal pain, diarrhea, constipation, blood in stool, abdominal distention and anal bleeding.  Genitourinary: Negative.  Negative for dysuria.  Musculoskeletal: Negative.  Negative for joint pain.  Skin: Negative.  Negative for rash.  Neurological: Negative.  Negative for headaches.  Hematological: Negative.   Psychiatric/Behavioral: Negative.        Objective:   Physical Exam  Vitals reviewed. Constitutional: She is oriented to person, place, and time. She appears well-developed and well-nourished. No distress.  HENT:  Head: No trismus in the jaw.  Right  Ear: Hearing, tympanic membrane, external ear and ear canal normal.  Left Ear: Hearing, tympanic membrane, external ear and ear canal normal.  Nose: Mucosal edema and rhinorrhea present. No nose lacerations, sinus tenderness, nasal deformity, septal deviation or nasal septal hematoma. No epistaxis.  No foreign bodies. Right sinus exhibits no maxillary sinus tenderness and no frontal sinus tenderness. Left sinus exhibits no maxillary sinus tenderness and no frontal sinus tenderness.  Mouth/Throat: Oropharynx is clear and moist and mucous membranes are normal. Mucous membranes are not pale, not dry and not cyanotic. No uvula swelling. No oropharyngeal exudate, posterior oropharyngeal edema, posterior oropharyngeal erythema or tonsillar abscesses.  Eyes: Conjunctivae are normal. Right eye exhibits no discharge. Left eye exhibits no discharge. No scleral icterus.  Neck: Normal range of motion. Neck supple. No JVD present. No tracheal deviation present. No thyromegaly present.  Cardiovascular: Normal rate, regular rhythm and intact distal pulses.  Exam reveals no gallop and no friction rub.   Murmur heard. Pulmonary/Chest: Effort normal and breath sounds normal. No stridor. No respiratory distress. She has no wheezes. She has no rales. She exhibits no tenderness.  Abdominal: Soft. Bowel sounds are normal. She exhibits no distension and no mass. There is no tenderness. There is no rebound and no guarding.  Musculoskeletal: Normal range of motion. She exhibits no edema and no tenderness.  Lymphadenopathy:    She has no cervical adenopathy.  Neurological: She is oriented to person, place, and time. She displays normal reflexes. She exhibits normal muscle tone.  Skin: Skin is warm and dry. No rash noted. She is not diaphoretic. No erythema. No pallor.  Psychiatric: She has a normal mood and affect. Her behavior is normal. Judgment and thought content normal.          Assessment &  Plan:

## 2011-02-04 NOTE — Patient Instructions (Signed)
Your physician recommends that you return for lab work in: Nov 2012.

## 2011-03-02 ENCOUNTER — Encounter: Payer: Self-pay | Admitting: Internal Medicine

## 2011-03-02 DIAGNOSIS — I2510 Atherosclerotic heart disease of native coronary artery without angina pectoris: Secondary | ICD-10-CM

## 2011-03-02 DIAGNOSIS — E7800 Pure hypercholesterolemia, unspecified: Secondary | ICD-10-CM

## 2011-03-12 ENCOUNTER — Other Ambulatory Visit (INDEPENDENT_AMBULATORY_CARE_PROVIDER_SITE_OTHER): Payer: Private Health Insurance - Indemnity

## 2011-03-12 ENCOUNTER — Other Ambulatory Visit: Payer: Self-pay | Admitting: Internal Medicine

## 2011-03-12 DIAGNOSIS — I2510 Atherosclerotic heart disease of native coronary artery without angina pectoris: Secondary | ICD-10-CM

## 2011-03-12 DIAGNOSIS — E7800 Pure hypercholesterolemia, unspecified: Secondary | ICD-10-CM

## 2011-03-12 LAB — LIPID PANEL: HDL: 83.4 mg/dL (ref >39.00)

## 2011-03-12 LAB — HEPATIC FUNCTION PANEL
ALT: 24 U/L (ref 0–35)
AST: 24 U/L (ref 0–37)
Bilirubin, Direct: 0.1 mg/dL (ref 0.0–0.3)
Total Protein: 7.7 g/dL (ref 6.0–8.3)

## 2011-03-14 ENCOUNTER — Encounter: Payer: Self-pay | Admitting: Internal Medicine

## 2011-08-18 LAB — HM DEXA SCAN

## 2011-08-19 ENCOUNTER — Other Ambulatory Visit: Payer: Self-pay | Admitting: Cardiology

## 2011-08-25 ENCOUNTER — Ambulatory Visit (INDEPENDENT_AMBULATORY_CARE_PROVIDER_SITE_OTHER): Payer: Private Health Insurance - Indemnity | Admitting: Internal Medicine

## 2011-08-25 ENCOUNTER — Encounter: Payer: Self-pay | Admitting: Internal Medicine

## 2011-08-25 ENCOUNTER — Other Ambulatory Visit (INDEPENDENT_AMBULATORY_CARE_PROVIDER_SITE_OTHER): Payer: Private Health Insurance - Indemnity

## 2011-08-25 VITALS — BP 132/86 | HR 86 | Temp 98.0°F | Resp 16 | Wt 136.8 lb

## 2011-08-25 DIAGNOSIS — E7800 Pure hypercholesterolemia, unspecified: Secondary | ICD-10-CM

## 2011-08-25 DIAGNOSIS — I350 Nonrheumatic aortic (valve) stenosis: Secondary | ICD-10-CM

## 2011-08-25 DIAGNOSIS — M810 Age-related osteoporosis without current pathological fracture: Secondary | ICD-10-CM

## 2011-08-25 DIAGNOSIS — E785 Hyperlipidemia, unspecified: Secondary | ICD-10-CM

## 2011-08-25 DIAGNOSIS — I359 Nonrheumatic aortic valve disorder, unspecified: Secondary | ICD-10-CM

## 2011-08-25 DIAGNOSIS — I2510 Atherosclerotic heart disease of native coronary artery without angina pectoris: Secondary | ICD-10-CM

## 2011-08-25 LAB — COMPREHENSIVE METABOLIC PANEL
ALT: 16 U/L (ref 0–35)
AST: 18 U/L (ref 0–37)
CO2: 27 mEq/L (ref 19–32)
Creatinine, Ser: 0.5 mg/dL (ref 0.4–1.2)
GFR: 144.32 mL/min (ref >60.00)
Sodium: 142 mEq/L (ref 135–145)
Total Bilirubin: 0.7 mg/dL (ref 0.3–1.2)
Total Protein: 7.1 g/dL (ref 6.0–8.3)

## 2011-08-25 LAB — HEPATIC FUNCTION PANEL
Alkaline Phosphatase: 53 U/L (ref 39–117)
Bilirubin, Direct: 0.1 mg/dL (ref 0.0–0.3)
Total Bilirubin: 0.7 mg/dL (ref 0.3–1.2)
Total Protein: 7.1 g/dL (ref 6.0–8.3)

## 2011-08-25 LAB — LIPID PANEL
Total CHOL/HDL Ratio: 2
VLDL: 18.2 mg/dL (ref 0.0–40.0)

## 2011-08-25 NOTE — Progress Notes (Signed)
  Subjective:    Patient ID: Carrie [name], female    DOB: [birth year], 66 y.o.   MRN: [id number]  Hyperlipidemia This is a chronic problem. The current episode started more than 1 year ago. The problem is controlled. Recent lipid tests were reviewed and are variable. She has no history of chronic renal disease, diabetes, hypothyroidism, liver disease, obesity or nephrotic syndrome. Factors aggravating her hyperlipidemia include no known factors. Pertinent negatives include no chest pain, focal sensory loss, focal weakness, leg pain, myalgias or shortness of breath. Current antihyperlipidemic treatment includes statins, exercise and diet change. The current treatment provides moderate improvement of lipids. There are no compliance problems.       Review of Systems  Constitutional: Negative for fever, chills, diaphoresis, activity change, appetite change, fatigue and unexpected weight change.  HENT: Negative.   Eyes: Negative.   Respiratory: Negative for cough, chest tightness, shortness of breath, wheezing and stridor.   Cardiovascular: Negative for chest pain, palpitations and leg swelling.  Gastrointestinal: Negative for nausea, vomiting, abdominal pain, diarrhea and constipation.  Musculoskeletal: Positive for back pain (chronic, unchanged). Negative for myalgias, arthralgias and gait problem.  Neurological: Negative.  Negative for focal weakness.  Hematological: Negative for adenopathy. Does not bruise/bleed easily.  Psychiatric/Behavioral: Negative.        Objective:   Physical Exam  Vitals reviewed. Constitutional: She is oriented to person, place, and time. She appears well-developed and well-nourished. No distress.  HENT:  Head: Normocephalic and atraumatic.  Mouth/Throat: Oropharynx is clear and moist. No oropharyngeal exudate.  Eyes: Conjunctivae are normal. Right eye exhibits no discharge. Left eye exhibits no discharge. No scleral icterus.  Neck: Normal range of motion.  Neck supple. No JVD present. No tracheal deviation present. No thyromegaly present.  Cardiovascular: Normal rate, regular rhythm, S1 normal, S2 normal and intact distal pulses.  Exam reveals no gallop, no S3, no S4 and no friction rub.   Murmur heard.  Decrescendo systolic murmur is present with a grade of 3/6   No diastolic murmur is present  Pulmonary/Chest: Effort normal and breath sounds normal. No stridor. No respiratory distress. She has no wheezes. She has no rales. She exhibits no tenderness.  Abdominal: Soft. Bowel sounds are normal. She exhibits no distension and no mass. There is no tenderness. There is no rebound and no guarding.  Musculoskeletal: Normal range of motion. She exhibits no edema and no tenderness.  Lymphadenopathy:    She has no cervical adenopathy.  Neurological: She is oriented to person, place, and time.  Skin: Skin is warm and dry. No rash noted. She is not diaphoretic. No erythema. No pallor.  Psychiatric: She has a normal mood and affect. Her behavior is normal. Judgment and thought content normal.      Lab Results  Component Value Date   WBC 6.6 08/28/2010   HGB 14.2 08/28/2010   HCT 40.5 08/28/2010   PLT 300.0 08/28/2010   GLUCOSE 74 08/28/2010   CHOL 211* 03/12/2011   TRIG 88.0 03/12/2011   HDL 83.40 03/12/2011   LDLDIRECT 112.8 03/12/2011   LDLCALC 93 01/06/2011   ALT 24 03/12/2011   AST 24 03/12/2011   NA 139 08/28/2010   K 4.8 08/28/2010   CL 104 08/28/2010   CREATININE 0.7 08/28/2010   BUN 20 08/28/2010   CO2 29 08/28/2010   TSH 1.04 08/28/2010      Assessment & Plan:

## 2011-08-25 NOTE — Assessment & Plan Note (Signed)
She is doing well on lipitor, I will check her FLP and CMP today

## 2011-08-25 NOTE — Assessment & Plan Note (Signed)
No changes made today, I asked her to have Dr. Luiz Blare forward a copy of her DEXA scan to me

## 2011-08-25 NOTE — Assessment & Plan Note (Signed)
She has a f/up appt soon with cardiology

## 2011-08-25 NOTE — Patient Instructions (Signed)

## 2011-09-24 ENCOUNTER — Encounter: Payer: Self-pay | Admitting: Endocrinology

## 2011-09-24 ENCOUNTER — Ambulatory Visit (INDEPENDENT_AMBULATORY_CARE_PROVIDER_SITE_OTHER): Payer: Private Health Insurance - Indemnity | Admitting: Endocrinology

## 2011-09-24 VITALS — BP 148/84 | HR 87 | Temp 97.9°F | Ht 61.0 in | Wt 136.0 lb

## 2011-09-24 DIAGNOSIS — R21 Rash and other nonspecific skin eruption: Secondary | ICD-10-CM

## 2011-09-24 MED ORDER — TRIAMCINOLONE ACETONIDE 0.025 % EX CREA: TOPICAL_CREAM | Freq: Three times a day (TID) | CUTANEOUS | Status: DC

## 2011-09-24 NOTE — Patient Instructions (Addendum)
i have sent a prescription to your pharmacy, for an anti-itch skin cream. I hope you feel better soon.  If you don't feel better by next week, please call back.  Please call sooner if you get worse.

## 2011-09-24 NOTE — Progress Notes (Signed)
  Subjective:    Patient ID: Carrie [name], female    DOB: [birth year], 66 y.o.   MRN: [id number]  HPI Pt states yew days of moderate itching of both eyelids.  No assoc visual loss.  She is unable to cite precip factor.   Past Medical History  Diagnosis Date  . Other specified cardiac dysrhythmias   . Undiagnosed cardiac murmurs   . Osteoporosis, unspecified   . Other and unspecified hyperlipidemia   . Coronary artery disease     Past Surgical History  Procedure Date  . Hernia repair   . Breast surgery     Benign tumor removed RT    History   Social History  . Marital Status: Divorced    Spouse Name: N/A    Number of Children: N/A  . Years of Education: N/A   Occupational History  . Not on file.   Social History Main Topics  . Smoking status: Former Games developer  . Smokeless tobacco: Not on file   Comment: Regular Exercise-No  . Alcohol Use: 1.8 oz/week    3 Glasses of wine per week  . Drug Use: No  . Sexually Active: Not Currently   Other Topics Concern  . Not on file   Social History Narrative  . No narrative on file    Current Outpatient Prescriptions on File Prior to Visit  Medication Sig Dispense Refill  . aspirin 81 MG tablet Take 81 mg by mouth daily.        Marland Kitchen atorvastatin (LIPITOR) 40 MG tablet TAKE 1 TABLET BY MOUTH EVERY DAY  30 tablet  3  . Cholecalciferol (VITAMIN D3) 1000 UNITS CAPS Take 1 capsule by mouth daily.        . fish oil-omega-3 fatty acids 1000 MG capsule Take 1 g by mouth daily.        . nitroGLYCERIN (NITROLINGUAL) 0.4 MG/SPRAY spray Place 1 spray under the tongue as directed.        . Thiamine HCl (VITAMIN B-1) 250 MG tablet Take 500 mg by mouth daily.        Marland Kitchen alendronate (FOSAMAX) 70 MG tablet Take 70 mg by mouth every 7 (seven) days. Take with a full glass of water on an empty stomach.         Allergies  Allergen Reactions  . Codeine     Family History  Problem Relation Age of Onset  . Heart disease Mother 67    CABG and valve  replacement; CHF  . Hypertension Other     BP 148/84  Pulse 87  Temp(Src) 97.9 F (36.6 C) (Oral)  Ht 5\' 1"  (1.549 m)  Wt 136 lb (61.689 kg)  BMI 25.70 kg/m2  SpO2 96%  Review of Systems No tearing from the eyes.    Objective:   Physical Exam VITAL SIGNS:  See vs page GENERAL: no distress Both periorbital areas have eczematous rash (R > L).       Assessment & Plan:  Rash, new.  prob due to contact dermatitis

## 2011-10-27 ENCOUNTER — Ambulatory Visit: Payer: Private Health Insurance - Indemnity | Admitting: Internal Medicine

## 2011-11-05 ENCOUNTER — Ambulatory Visit: Payer: Private Health Insurance - Indemnity | Admitting: Cardiovascular Disease

## 2011-11-05 ENCOUNTER — Ambulatory Visit (INDEPENDENT_AMBULATORY_CARE_PROVIDER_SITE_OTHER): Payer: Private Health Insurance - Indemnity | Admitting: Cardiology

## 2011-11-05 ENCOUNTER — Encounter: Payer: Self-pay | Admitting: Cardiology

## 2011-11-05 VITALS — BP 146/82 | HR 76 | Ht 61.0 in | Wt 135.0 lb

## 2011-11-05 DIAGNOSIS — I350 Nonrheumatic aortic (valve) stenosis: Secondary | ICD-10-CM

## 2011-11-05 DIAGNOSIS — E785 Hyperlipidemia, unspecified: Secondary | ICD-10-CM

## 2011-11-05 DIAGNOSIS — I359 Nonrheumatic aortic valve disorder, unspecified: Secondary | ICD-10-CM

## 2011-11-05 DIAGNOSIS — I498 Other specified cardiac arrhythmias: Secondary | ICD-10-CM

## 2011-11-05 DIAGNOSIS — I2510 Atherosclerotic heart disease of native coronary artery without angina pectoris: Secondary | ICD-10-CM

## 2011-11-05 NOTE — Patient Instructions (Signed)
Your physician recommends that you return for a FASTING lipid profile /liver profile.  Your physician wants you to follow-up in: 1 year with Dr Shirlee Latch. (July 2014). You will receive a reminder letter in the mail two months in advance. If you don't receive a letter, please call our office to schedule the follow-up appointment.

## 2011-11-07 NOTE — Assessment & Plan Note (Signed)
No symptomatic recurrence since last appointment.

## 2011-11-07 NOTE — Progress Notes (Signed)
Patient ID: Carrie [name], female   DOB: [birth year], 66 y.o.   MRN: [id number] PCP: Dr. Yetta Barre  66 yo with history of nonobstructive CAD, SVT s/p ablation, and mild aortic stenosis presents for cardiology followup.  She has been doing well symptomatically.  Her mother who had been living with her died this spring and she is not cooking as much.  This likely explains her 10 lb weight loss since prior appointment.  LDL was high in 5/13 and statin was changed to atorvastatin.  No episodes of tachypalpitations.  No chest pain or dyspnea.  No lightheadedness or syncope.    ECG: NSR, normal  Labs (5/12): LDL 151, HDL 74, K 4.8, creatinine 0.7, TSH normal Labs (5/13): LDL 112, HDL 86, K 4.3, creatinine 0.5  PMH:  1. CAD: LHC 2005 with 50-60% mid LAD stenosis.  ETT-myoview (6/10): Exercised 10 minutes, ST depression with exercise, no ischemia or infarction on perfusion images.   2. SVT ablation 2005 3. Aortic stenosis: Mild.  Echo (3/11) with EF 55-60%, trileaflet aortic valve with mild AS with mean gradient 15 mmHg, mild MR.  Echo (6/13): EF 60-65%, mild AS mean gradient 14 mmHg, mild MR.  4. Hyperlipidemia 5. Osteoporosis.  FH: HTN.  Father with CABG + AVR at age 63.  Mother with CHF.   SH: Divorced.  Nonsmoker.  Lives in [facility].  Works part time at [employer] and full time at [employer] [place].    Current Outpatient Prescriptions  Medication Sig Dispense Refill  . aspirin 81 MG tablet Take 81 mg by mouth daily.        Marland Kitchen atorvastatin (LIPITOR) 40 MG tablet TAKE 1 TABLET BY MOUTH EVERY DAY  30 tablet  3  . CALCIUM PO Take by mouth daily.      . Cholecalciferol (VITAMIN D3) 1000 UNITS CAPS Take 1 capsule by mouth daily.        . fish oil-omega-3 fatty acids 1000 MG capsule Take 1 g by mouth daily.        . nitroGLYCERIN (NITROLINGUAL) 0.4 MG/SPRAY spray Place 1 spray under the tongue as directed.        . Thiamine HCl (VITAMIN B-1) 250 MG tablet Take 500 mg by mouth daily.          BP 146/82   Pulse 76  Ht 5\' 1"  (1.549 m)  Wt 135 lb (61.236 kg)  BMI 25.51 kg/m2 General: NAD Neck: No JVD, no thyromegaly or thyroid nodule.  Lungs: Clear to auscultation bilaterally with normal respiratory effort. CV: Nondisplaced PMI.  Heart regular S1/S2, no S3/S4, 2/6 systolic crescendo-decrescendo murmur RUSB, S2 heard clearly.  No peripheral edema.  No carotid bruit.  Normal pedal pulses.  Abdomen: Soft, nontender, no hepatosplenomegaly, no distention.  Neurologic: Alert and oriented x 3.  Psych: Normal affect. Extremities: No clubbing or cyanosis.

## 2011-11-07 NOTE — Assessment & Plan Note (Signed)
Goal LDL < 70 given known CAD.  Statin was changed to atorvastatin after last lipids profile.  I will get repeat lipids/LFTs.

## 2011-11-07 NOTE — Assessment & Plan Note (Signed)
Nonobstructive CAD on 2005 cath.  No ischemic symptoms.  Continue ASA and statin.

## 2011-11-07 NOTE — Assessment & Plan Note (Signed)
Mild AS on recent echo.  

## 2011-11-11 ENCOUNTER — Other Ambulatory Visit (INDEPENDENT_AMBULATORY_CARE_PROVIDER_SITE_OTHER): Payer: Private Health Insurance - Indemnity

## 2011-11-11 ENCOUNTER — Ambulatory Visit (INDEPENDENT_AMBULATORY_CARE_PROVIDER_SITE_OTHER): Payer: Private Health Insurance - Indemnity | Admitting: Internal Medicine

## 2011-11-11 ENCOUNTER — Encounter: Payer: Self-pay | Admitting: Internal Medicine

## 2011-11-11 VITALS — BP 136/80 | HR 88 | Temp 98.7°F | Resp 16 | Wt 135.8 lb

## 2011-11-11 DIAGNOSIS — M810 Age-related osteoporosis without current pathological fracture: Secondary | ICD-10-CM

## 2011-11-11 LAB — BASIC METABOLIC PANEL
BUN: 17 mg/dL (ref 6–23)
Calcium: 9.4 mg/dL (ref 8.4–10.5)
GFR: 84.64 mL/min (ref >60.00)
Glucose, Bld: 97 mg/dL (ref 70–99)

## 2011-11-11 NOTE — Patient Instructions (Signed)
Osteoporosis  Osteoporosis is a disease of the bones that makes them weaker and prone to break (fracture). By their mid-30s, most people begin to gradually lose bone strength. If this is severe enough, osteoporosis may occur. Osteopenia is a less severe weakness of the bones, which places you at risk for osteoporosis. It is important to identify if you have osteoporosis or osteopenia. Bone fractures from osteoporosis (especially hip and spine fractures) are a major cause of hospitalization, loss of independence, and can lead to life-threatening complications.  CAUSES    There are a number of causes and risk factors:   Gender. Women are at a higher risk for osteoporosis than men.    Age. Bone formation slows down with age.    Ethnicity. For unclear reasons, white and Asian women are at higher risk for osteoporosis. Hispanic and African American women are at increased, but lesser, risk.    Family history of osteoporosis can mean that you are at a higher risk for getting it.    History of bone fractures indicates you may be at higher risk of another.    Calcium is very important for bone health and strength. Not enough calcium in your diet increases your risk for osteoporosis. Vitamin D is important for calcium metabolism. You get vitamin D from sunlight, foods, or supplements.    Physical activity. Bones get stronger with weight-bearing exercise and weaker without use.    Smoking is associated with decreased bone strength.    Medicines. Cortisone medicines, too much thyroid medicine, some cancer and seizure medicines, and others can weaken bones and cause osteoporosis.    Decreased body weight is associated with osteoporosis. The small amount of estrogen-type molecules produced in fat cells seems to protect the bones.    Menopausal decrease in the hormone estrogen can cause osteoporosis.    Low levels of the hormone testosterone can cause osteoporosis.     Some medical conditions can lead to osteoporosis (hyperthyroidism, hyperparathyroidism, B12 deficiency).   SYMPTOMS    Usually, no symptoms are felt as the bones weaken. The first symptoms are generally related to bone fractures. You may have silent, tiny bone fractures, especially in your spine. This can cause height loss and forward bending of the spine (kyphosis).  DIAGNOSIS    You or your caregiver may suspect osteoporosis based on height loss and kyphosis. Osteoporosis or osteopenia may be identified on an X-ray done for other reasons. A bone density measurement will likely be taken. Your bones are often measured at your lower spine or your hips. Measurement is done by an X-ray called a DEXA scan, or sometimes by a computerized X-ray scan (CT or CAT scan). Other tests may be done to find the cause of osteoporosis, such as blood tests to measure calcium and vitamin D, or to monitor treatment.  TREATMENT    The goal of osteoporosis treatment is to prevent fractures. This is done through medicine and home care treatments. Treatment will slow the weakening of your bones and strengthen them where possible. Measures to decrease the likelihood of falling and fracturing a bone are also important.  Medicine   You may need supplements if you are not getting enough calcium, vitamin D, and vitamin B12.    If you are female and menopausal, you should discuss the option of estrogen replacement or estrogen-like medicine with your caregiver.    Medicines can be taken by mouth or injection to help build bone strength. When taken by mouth, there are important directions   that you need to follow.    Calcitonin is a hormone made by the thyroid gland that can help build bone strength and decrease fracture risk in the spine. It can be taken by nasal spray or injection.    Parathyroid hormone can be injected to help build bone strength.    You will need to continue to get enough calcium intake with any of these medicines.    FALL PREVENTION   If you are unsteady on your feet, use a cane, walker, or walk with someone's help.    Remove loose rugs or electrical cords from your home.    Keep your home well lit at night. Use glasses if you need them.    Avoid icy streets and wet or waxed floors.    Hold the railing when using stairs.    Watch out for your pets.    Install grab bars in your bathroom.    Exercise. Physical activity, especially weight-bearing exercise, helps strengthen bones. Strength and balance exercise, such as tai chi, helps prevent falls.    Alcohol and some medicines can make you more likely to fall. Discuss alcohol use with your caregiver. Ask your caregiver if any of your medicines might increase your risk for falling. Ask if safer alternatives are available.   HOME CARE INSTRUCTIONS     Try to prevent and avoid falls.    To pick up objects, bend at the knees. Do not bend with your back.    Do not smoke. If you smoke, ask for help to stop.    Have adequate calcium and vitamin D in your diet. Talk with your caregiver about amounts.    Before exercising, ask your caregiver what exercises will be good for you.    Only take over-the-counter or prescription medicines for pain, discomfort, or fever as directed by your caregiver.   SEEK MEDICAL CARE IF:     You have had a fracture and your pain is not controlled.    You have had a fracture and you are not able to return to activities as expected.    You are reinjured.    You develop side effects from medicines, especially stomach pain or trouble swallowing.    You develop new, unexplained problems.   SEEK IMMEDIATE MEDICAL CARE IF:     You develop sudden, severe pain in your back.    You develop pain after an injury or fall.   Document Released: 01/20/2005 Document Revised: 04/01/2011 Document Reviewed: 03/27/2011  [employer] Patient Information 2012 [employer].

## 2011-11-11 NOTE — Progress Notes (Signed)
  Subjective:    Patient ID: Carrie [name], female    DOB: 07-03-1945, 66 y.o.   MRN: [id number]  HPI  She returns and she tells me that she wants to start treatment for osteoporosis, she had a DEXA scan done by Dr. Luiz Blare and she has a T score of -3.7 in her spine.  Review of Systems  Constitutional: Negative.   HENT: Negative.   Eyes: Negative.   Respiratory: Negative.   Cardiovascular: Negative.   Gastrointestinal: Negative.   Genitourinary: Negative.   Musculoskeletal: Negative.   Skin: Negative.   Neurological: Negative.   Hematological: Negative.   Psychiatric/Behavioral: Negative.        Objective:   Physical Exam  Vitals reviewed. Constitutional: She is oriented to person, place, and time. She appears well-developed and well-nourished. No distress.  HENT:  Head: Normocephalic and atraumatic.  Mouth/Throat: Oropharynx is clear and moist. No oropharyngeal exudate.  Eyes: Conjunctivae are normal. Right eye exhibits no discharge. Left eye exhibits no discharge. No scleral icterus.  Neck: Normal range of motion. Neck supple. No JVD present. No tracheal deviation present. No thyromegaly present.  Cardiovascular: Normal rate, regular rhythm and intact distal pulses.  Exam reveals no gallop and no friction rub.   Murmur heard. Pulmonary/Chest: Effort normal and breath sounds normal. No stridor. No respiratory distress. She has no wheezes. She has no rales. She exhibits no tenderness.  Abdominal: Soft. Bowel sounds are normal. She exhibits no distension and no mass. There is no tenderness. There is no rebound and no guarding.  Musculoskeletal: Normal range of motion. She exhibits no edema and no tenderness.  Lymphadenopathy:    She has no cervical adenopathy.  Neurological: She is oriented to person, place, and time.  Skin: Skin is warm and dry. No rash noted. She is not diaphoretic. No erythema. No pallor.  Psychiatric: She has a normal mood and affect. Her behavior is  normal. Judgment and thought content normal.    Lab Results  Component Value Date   WBC 6.6 08/28/2010   HGB 14.2 08/28/2010   HCT 40.5 08/28/2010   PLT 300.0 08/28/2010   GLUCOSE 102* 08/25/2011   CHOL 205* 08/25/2011   TRIG 91.0 08/25/2011   HDL 86.00 08/25/2011   LDLDIRECT 112.1 08/25/2011   LDLCALC 93 01/06/2011   ALT 16 08/25/2011   ALT 16 08/25/2011   AST 18 08/25/2011   AST 18 08/25/2011   NA 142 08/25/2011   K 4.3 08/25/2011   CL 107 08/25/2011   CREATININE 0.5 08/25/2011   BUN 22 08/25/2011   CO2 27 08/25/2011   TSH 1.04 08/28/2010       Assessment & Plan:

## 2011-11-11 NOTE — Assessment & Plan Note (Signed)
I will check her labs today for renal function, Ca++/PO4--, Vit D, we will start to apply for prolia approval

## 2011-11-15 LAB — VITAMIN D 1,25 DIHYDROXY: Vitamin D 1, 25 (OH)2 Total: 58 pg/mL (ref 18–72)

## 2011-11-16 ENCOUNTER — Other Ambulatory Visit (INDEPENDENT_AMBULATORY_CARE_PROVIDER_SITE_OTHER): Payer: Private Health Insurance - Indemnity

## 2011-11-16 DIAGNOSIS — E785 Hyperlipidemia, unspecified: Secondary | ICD-10-CM

## 2011-11-16 DIAGNOSIS — I359 Nonrheumatic aortic valve disorder, unspecified: Secondary | ICD-10-CM

## 2011-11-16 LAB — LIPID PANEL
HDL: 83.5 mg/dL (ref >39.00)
Total CHOL/HDL Ratio: 3
VLDL: 19.4 mg/dL (ref 0.0–40.0)

## 2011-11-16 LAB — HEPATIC FUNCTION PANEL
Bilirubin, Direct: 0.1 mg/dL (ref 0.0–0.3)
Total Bilirubin: 0.7 mg/dL (ref 0.3–1.2)
Total Protein: 7.5 g/dL (ref 6.0–8.3)

## 2011-11-24 ENCOUNTER — Telehealth: Payer: Self-pay | Admitting: Cardiology

## 2011-11-24 LAB — HM MAMMOGRAPHY: HM Mammogram: NORMAL

## 2011-11-24 NOTE — Telephone Encounter (Signed)
LMTCB

## 2011-11-24 NOTE — Telephone Encounter (Signed)
Follow-up:    Patient returned your call.  Please call back after 4pm.

## 2011-11-24 NOTE — Telephone Encounter (Signed)
Pt rtn your call from yesterday 

## 2011-11-25 NOTE — Telephone Encounter (Signed)
Patient returning nurse call she can be reached at [phone]

## 2011-11-25 NOTE — Telephone Encounter (Signed)
F/U  Patient calling back to speak with Thurston Hole aware that she is off today. Patient states she has calling her since Monday.

## 2011-11-25 NOTE — Telephone Encounter (Signed)
Left patient a message that Carrie [name] will be back tomorrow but to call me back today if there is anything I can help her with

## 2011-11-26 ENCOUNTER — Other Ambulatory Visit: Payer: Self-pay | Admitting: *Deleted

## 2011-11-26 DIAGNOSIS — E785 Hyperlipidemia, unspecified: Secondary | ICD-10-CM

## 2011-11-26 NOTE — Telephone Encounter (Signed)
Spoke with pt about recent lab results 

## 2011-12-16 ENCOUNTER — Encounter: Payer: Self-pay | Admitting: Internal Medicine

## 2011-12-16 ENCOUNTER — Ambulatory Visit (INDEPENDENT_AMBULATORY_CARE_PROVIDER_SITE_OTHER): Payer: Private Health Insurance - Indemnity | Admitting: Internal Medicine

## 2011-12-16 VITALS — BP 142/80 | HR 82 | Temp 98.4°F | Resp 16 | Wt 136.5 lb

## 2011-12-16 DIAGNOSIS — E785 Hyperlipidemia, unspecified: Secondary | ICD-10-CM

## 2011-12-16 DIAGNOSIS — I350 Nonrheumatic aortic (valve) stenosis: Secondary | ICD-10-CM

## 2011-12-16 DIAGNOSIS — I359 Nonrheumatic aortic valve disorder, unspecified: Secondary | ICD-10-CM

## 2011-12-16 DIAGNOSIS — M810 Age-related osteoporosis without current pathological fracture: Secondary | ICD-10-CM

## 2011-12-16 NOTE — Progress Notes (Signed)
  Subjective:    Patient ID: Carrie [name], female    DOB: October 18, 1945, 66 y.o.   MRN: [id number]  Hypertension This is a chronic problem. The current episode started more than 1 year ago. The problem has been gradually improving since onset. The problem is controlled. Pertinent negatives include no anxiety, blurred vision, chest pain, headaches, malaise/fatigue, neck pain, orthopnea, palpitations, peripheral edema, PND, shortness of breath or sweats. There are no associated agents to hypertension. Past treatments include nothing. The current treatment provides moderate improvement. There are no compliance problems.       Review of Systems  Constitutional: Negative.  Negative for malaise/fatigue.  HENT: Negative.  Negative for neck pain.   Eyes: Negative.  Negative for blurred vision.  Respiratory: Negative.  Negative for shortness of breath.   Cardiovascular: Negative.  Negative for chest pain, palpitations, orthopnea and PND.  Gastrointestinal: Negative.   Genitourinary: Negative.   Musculoskeletal: Negative.   Skin: Negative.   Neurological: Negative.  Negative for headaches.  Hematological: Negative.   Psychiatric/Behavioral: Negative.        Objective:   Physical Exam  Vitals reviewed. Constitutional: She is oriented to person, place, and time. She appears well-developed and well-nourished. No distress.  HENT:  Head: Normocephalic and atraumatic.  Mouth/Throat: Oropharynx is clear and moist. No oropharyngeal exudate.  Eyes: Conjunctivae are normal. Right eye exhibits no discharge. Left eye exhibits no discharge. No scleral icterus.  Neck: Normal range of motion. Neck supple. No JVD present. No tracheal deviation present. No thyromegaly present.  Cardiovascular: Normal rate, regular rhythm and intact distal pulses.  Exam reveals no gallop and no friction rub.   Murmur heard.  Decrescendo systolic murmur is present with a grade of 1/6  Pulmonary/Chest: Effort normal and breath  sounds normal. No stridor. No respiratory distress. She has no wheezes. She has no rales. She exhibits no tenderness.  Abdominal: Soft. Bowel sounds are normal. She exhibits no distension and no mass. There is no tenderness. There is no rebound and no guarding.  Musculoskeletal: Normal range of motion. She exhibits no edema and no tenderness.  Lymphadenopathy:    She has no cervical adenopathy.  Neurological: She is oriented to person, place, and time.  Skin: Skin is warm and dry. No rash noted. She is not diaphoretic. No erythema. No pallor.  Psychiatric: She has a normal mood and affect. Her behavior is normal. Judgment and thought content normal.     Lab Results  Component Value Date   WBC 6.6 08/28/2010   HGB 14.2 08/28/2010   HCT 40.5 08/28/2010   PLT 300.0 08/28/2010   GLUCOSE 97 11/11/2011   CHOL 255* 11/16/2011   TRIG 97.0 11/16/2011   HDL 83.50 11/16/2011   LDLDIRECT 149.1 11/16/2011   LDLCALC 93 01/06/2011   ALT 15 11/16/2011   AST 17 11/16/2011   NA 140 11/11/2011   K 4.7 11/11/2011   CL 105 11/11/2011   CREATININE 0.7 11/11/2011   BUN 17 11/11/2011   CO2 28 11/11/2011   TSH 1.04 08/28/2010       Assessment & Plan:

## 2011-12-16 NOTE — Assessment & Plan Note (Signed)
She will start prolia soon

## 2011-12-16 NOTE — Assessment & Plan Note (Signed)
No s/s to be concerned about

## 2011-12-16 NOTE — Patient Instructions (Signed)
Osteoporosis  Osteoporosis is a disease of the bones that makes them weaker and prone to break (fracture). By their mid-30s, most people begin to gradually lose bone strength. If this is severe enough, osteoporosis may occur. Osteopenia is a less severe weakness of the bones, which places you at risk for osteoporosis. It is important to identify if you have osteoporosis or osteopenia. Bone fractures from osteoporosis (especially hip and spine fractures) are a major cause of hospitalization, loss of independence, and can lead to life-threatening complications.  CAUSES    There are a number of causes and risk factors:   Gender. Women are at a higher risk for osteoporosis than men.    Age. Bone formation slows down with age.    Ethnicity. For unclear reasons, white and Asian women are at higher risk for osteoporosis. Hispanic and African American women are at increased, but lesser, risk.    Family history of osteoporosis can mean that you are at a higher risk for getting it.    History of bone fractures indicates you may be at higher risk of another.    Calcium is very important for bone health and strength. Not enough calcium in your diet increases your risk for osteoporosis. Vitamin D is important for calcium metabolism. You get vitamin D from sunlight, foods, or supplements.    Physical activity. Bones get stronger with weight-bearing exercise and weaker without use.    Smoking is associated with decreased bone strength.    Medicines. Cortisone medicines, too much thyroid medicine, some cancer and seizure medicines, and others can weaken bones and cause osteoporosis.    Decreased body weight is associated with osteoporosis. The small amount of estrogen-type molecules produced in fat cells seems to protect the bones.    Menopausal decrease in the hormone estrogen can cause osteoporosis.    Low levels of the hormone testosterone can cause osteoporosis.     Some medical conditions can lead to osteoporosis (hyperthyroidism, hyperparathyroidism, B12 deficiency).   SYMPTOMS    Usually, no symptoms are felt as the bones weaken. The first symptoms are generally related to bone fractures. You may have silent, tiny bone fractures, especially in your spine. This can cause height loss and forward bending of the spine (kyphosis).  DIAGNOSIS    You or your caregiver may suspect osteoporosis based on height loss and kyphosis. Osteoporosis or osteopenia may be identified on an X-ray done for other reasons. A bone density measurement will likely be taken. Your bones are often measured at your lower spine or your hips. Measurement is done by an X-ray called a DEXA scan, or sometimes by a computerized X-ray scan (CT or CAT scan). Other tests may be done to find the cause of osteoporosis, such as blood tests to measure calcium and vitamin D, or to monitor treatment.  TREATMENT    The goal of osteoporosis treatment is to prevent fractures. This is done through medicine and home care treatments. Treatment will slow the weakening of your bones and strengthen them where possible. Measures to decrease the likelihood of falling and fracturing a bone are also important.  Medicine   You may need supplements if you are not getting enough calcium, vitamin D, and vitamin B12.    If you are female and menopausal, you should discuss the option of estrogen replacement or estrogen-like medicine with your caregiver.    Medicines can be taken by mouth or injection to help build bone strength. When taken by mouth, there are important directions   that you need to follow.    Calcitonin is a hormone made by the thyroid gland that can help build bone strength and decrease fracture risk in the spine. It can be taken by nasal spray or injection.    Parathyroid hormone can be injected to help build bone strength.    You will need to continue to get enough calcium intake with any of these medicines.    FALL PREVENTION   If you are unsteady on your feet, use a cane, walker, or walk with someone's help.    Remove loose rugs or electrical cords from your home.    Keep your home well lit at night. Use glasses if you need them.    Avoid icy streets and wet or waxed floors.    Hold the railing when using stairs.    Watch out for your pets.    Install grab bars in your bathroom.    Exercise. Physical activity, especially weight-bearing exercise, helps strengthen bones. Strength and balance exercise, such as tai chi, helps prevent falls.    Alcohol and some medicines can make you more likely to fall. Discuss alcohol use with your caregiver. Ask your caregiver if any of your medicines might increase your risk for falling. Ask if safer alternatives are available.   HOME CARE INSTRUCTIONS     Try to prevent and avoid falls.    To pick up objects, bend at the knees. Do not bend with your back.    Do not smoke. If you smoke, ask for help to stop.    Have adequate calcium and vitamin D in your diet. Talk with your caregiver about amounts.    Before exercising, ask your caregiver what exercises will be good for you.    Only take over-the-counter or prescription medicines for pain, discomfort, or fever as directed by your caregiver.   SEEK MEDICAL CARE IF:     You have had a fracture and your pain is not controlled.    You have had a fracture and you are not able to return to activities as expected.    You are reinjured.    You develop side effects from medicines, especially stomach pain or trouble swallowing.    You develop new, unexplained problems.   SEEK IMMEDIATE MEDICAL CARE IF:     You develop sudden, severe pain in your back.    You develop pain after an injury or fall.   Document Released: 01/20/2005 Document Revised: 04/01/2011 Document Reviewed: 03/27/2011  [employer] Patient Information 2012 [employer].

## 2011-12-16 NOTE — Assessment & Plan Note (Signed)
She is doing well on lipitor 

## 2012-01-10 ENCOUNTER — Ambulatory Visit: Payer: Private Health Insurance - Indemnity | Admitting: Internal Medicine

## 2012-01-18 ENCOUNTER — Encounter: Payer: Self-pay | Admitting: Internal Medicine

## 2012-01-18 ENCOUNTER — Ambulatory Visit (INDEPENDENT_AMBULATORY_CARE_PROVIDER_SITE_OTHER): Payer: Private Health Insurance - Indemnity | Admitting: Internal Medicine

## 2012-01-18 VITALS — BP 140/80 | HR 66 | Temp 97.6°F | Resp 16

## 2012-01-18 DIAGNOSIS — M810 Age-related osteoporosis without current pathological fracture: Secondary | ICD-10-CM

## 2012-01-18 LAB — HM COLONOSCOPY

## 2012-01-18 NOTE — Patient Instructions (Signed)
Osteoporosis  Osteoporosis is a disease of the bones that makes them weaker and prone to break (fracture). By their mid-30s, most people begin to gradually lose bone strength. If this is severe enough, osteoporosis may occur. Osteopenia is a less severe weakness of the bones, which places you at risk for osteoporosis. It is important to identify if you have osteoporosis or osteopenia. Bone fractures from osteoporosis (especially hip and spine fractures) are a major cause of hospitalization, loss of independence, and can lead to life-threatening complications.  CAUSES    There are a number of causes and risk factors:   Gender. Women are at a higher risk for osteoporosis than men.    Age. Bone formation slows down with age.    Ethnicity. For unclear reasons, white and Asian women are at higher risk for osteoporosis. Hispanic and African American women are at increased, but lesser, risk.    Family history of osteoporosis can mean that you are at a higher risk for getting it.    History of bone fractures indicates you may be at higher risk of another.    Calcium is very important for bone health and strength. Not enough calcium in your diet increases your risk for osteoporosis. Vitamin D is important for calcium metabolism. You get vitamin D from sunlight, foods, or supplements.    Physical activity. Bones get stronger with weight-bearing exercise and weaker without use.    Smoking is associated with decreased bone strength.    Medicines. Cortisone medicines, too much thyroid medicine, some cancer and seizure medicines, and others can weaken bones and cause osteoporosis.    Decreased body weight is associated with osteoporosis. The small amount of estrogen-type molecules produced in fat cells seems to protect the bones.    Menopausal decrease in the hormone estrogen can cause osteoporosis.    Low levels of the hormone testosterone can cause osteoporosis.     Some medical conditions can lead to osteoporosis (hyperthyroidism, hyperparathyroidism, B12 deficiency).   SYMPTOMS    Usually, no symptoms are felt as the bones weaken. The first symptoms are generally related to bone fractures. You may have silent, tiny bone fractures, especially in your spine. This can cause height loss and forward bending of the spine (kyphosis).  DIAGNOSIS    You or your caregiver may suspect osteoporosis based on height loss and kyphosis. Osteoporosis or osteopenia may be identified on an X-ray done for other reasons. A bone density measurement will likely be taken. Your bones are often measured at your lower spine or your hips. Measurement is done by an X-ray called a DEXA scan, or sometimes by a computerized X-ray scan (CT or CAT scan). Other tests may be done to find the cause of osteoporosis, such as blood tests to measure calcium and vitamin D, or to monitor treatment.  TREATMENT    The goal of osteoporosis treatment is to prevent fractures. This is done through medicine and home care treatments. Treatment will slow the weakening of your bones and strengthen them where possible. Measures to decrease the likelihood of falling and fracturing a bone are also important.  Medicine   You may need supplements if you are not getting enough calcium, vitamin D, and vitamin B12.    If you are female and menopausal, you should discuss the option of estrogen replacement or estrogen-like medicine with your caregiver.    Medicines can be taken by mouth or injection to help build bone strength. When taken by mouth, there are important directions   that you need to follow.    Calcitonin is a hormone made by the thyroid gland that can help build bone strength and decrease fracture risk in the spine. It can be taken by nasal spray or injection.    Parathyroid hormone can be injected to help build bone strength.    You will need to continue to get enough calcium intake with any of these medicines.    FALL PREVENTION   If you are unsteady on your feet, use a cane, walker, or walk with someone's help.    Remove loose rugs or electrical cords from your home.    Keep your home well lit at night. Use glasses if you need them.    Avoid icy streets and wet or waxed floors.    Hold the railing when using stairs.    Watch out for your pets.    Install grab bars in your bathroom.    Exercise. Physical activity, especially weight-bearing exercise, helps strengthen bones. Strength and balance exercise, such as tai chi, helps prevent falls.    Alcohol and some medicines can make you more likely to fall. Discuss alcohol use with your caregiver. Ask your caregiver if any of your medicines might increase your risk for falling. Ask if safer alternatives are available.   HOME CARE INSTRUCTIONS     Try to prevent and avoid falls.    To pick up objects, bend at the knees. Do not bend with your back.    Do not smoke. If you smoke, ask for help to stop.    Have adequate calcium and vitamin D in your diet. Talk with your caregiver about amounts.    Before exercising, ask your caregiver what exercises will be good for you.    Only take over-the-counter or prescription medicines for pain, discomfort, or fever as directed by your caregiver.   SEEK MEDICAL CARE IF:     You have had a fracture and your pain is not controlled.    You have had a fracture and you are not able to return to activities as expected.    You are reinjured.    You develop side effects from medicines, especially stomach pain or trouble swallowing.    You develop new, unexplained problems.   SEEK IMMEDIATE MEDICAL CARE IF:     You develop sudden, severe pain in your back.    You develop pain after an injury or fall.   Document Released: 01/20/2005 Document Revised: 04/01/2011 Document Reviewed: 03/27/2011  [employer] Patient Information 2012 [employer].

## 2012-01-18 NOTE — Assessment & Plan Note (Signed)
She started prolia today

## 2012-01-18 NOTE — Progress Notes (Signed)
  Subjective:    Patient ID: Carrie [name], female    DOB: 09-20-1945, 66 y.o.   MRN: [id number]  HPI  She came in today to get her first prolia injection.  Review of Systems  Constitutional: Negative.   HENT: Negative.   Eyes: Negative.   Respiratory: Negative.   Cardiovascular: Negative.   Gastrointestinal: Negative.   Genitourinary: Negative.   Musculoskeletal: Negative.   Skin: Negative.   Neurological: Negative.   Hematological: Negative.   Psychiatric/Behavioral: Negative.        Objective:   Physical Exam  Vitals reviewed. Constitutional: She is oriented to person, place, and time. She appears well-developed and well-nourished. No distress.  HENT:  Head: Normocephalic and atraumatic.  Mouth/Throat: Oropharynx is clear and moist. No oropharyngeal exudate.  Eyes: Conjunctivae normal are normal. Right eye exhibits no discharge. Left eye exhibits no discharge. No scleral icterus.  Neck: Normal range of motion. Neck supple. No tracheal deviation present. No thyromegaly present.  Cardiovascular: Normal rate, regular rhythm and intact distal pulses.  Exam reveals no gallop and no friction rub.   Murmur heard. Pulmonary/Chest: Effort normal and breath sounds normal. No stridor. No respiratory distress. She has no wheezes. She has no rales. She exhibits no tenderness.  Abdominal: Soft. Bowel sounds are normal. She exhibits no distension and no mass. There is no tenderness. There is no rebound and no guarding.  Musculoskeletal: Normal range of motion. She exhibits no edema and no tenderness.  Lymphadenopathy:    She has no cervical adenopathy.  Neurological: She is oriented to person, place, and time.  Skin: Skin is warm and dry. No rash noted. She is not diaphoretic. No erythema. No pallor.  Psychiatric: She has a normal mood and affect. Her behavior is normal. Judgment and thought content normal.      Lab Results  Component Value Date   WBC 6.6 08/28/2010   HGB 14.2  08/28/2010   HCT 40.5 08/28/2010   PLT 300.0 08/28/2010   GLUCOSE 97 11/11/2011   CHOL 255* 11/16/2011   TRIG 97.0 11/16/2011   HDL 83.50 11/16/2011   LDLDIRECT 149.1 11/16/2011   LDLCALC 93 01/06/2011   ALT 15 11/16/2011   AST 17 11/16/2011   NA 140 11/11/2011   K 4.7 11/11/2011   CL 105 11/11/2011   CREATININE 0.7 11/11/2011   BUN 17 11/11/2011   CO2 28 11/11/2011   TSH 1.04 08/28/2010      Assessment & Plan:

## 2012-01-19 MED ORDER — DENOSUMAB 60 MG/ML ~~LOC~~ SOLN
60.0000 mg | Freq: Once | SUBCUTANEOUS | Status: AC
  Administered 2012-01-19: 60 mg via SUBCUTANEOUS

## 2012-01-19 NOTE — Addendum Note (Signed)
Addended by: Rock Nephew T on: 01/19/2012 10:58 AM   Modules accepted: Orders

## 2012-01-26 ENCOUNTER — Other Ambulatory Visit (INDEPENDENT_AMBULATORY_CARE_PROVIDER_SITE_OTHER): Payer: Private Health Insurance - Indemnity

## 2012-01-26 DIAGNOSIS — E785 Hyperlipidemia, unspecified: Secondary | ICD-10-CM

## 2012-01-26 LAB — LIPID PANEL
Cholesterol: 248 mg/dL — ABNORMAL HIGH (ref 0–200)
Triglycerides: 134 mg/dL (ref 0.0–149.0)

## 2012-01-26 LAB — HEPATIC FUNCTION PANEL
ALT: 31 U/L (ref 0–35)
AST: 29 U/L (ref 0–37)
Albumin: 3.8 g/dL (ref 3.5–5.2)
Alkaline Phosphatase: 67 U/L (ref 39–117)

## 2012-01-26 LAB — LDL CHOLESTEROL, DIRECT: Direct LDL: 145 mg/dL

## 2012-02-02 ENCOUNTER — Telehealth: Payer: Self-pay | Admitting: Cardiology

## 2012-02-02 NOTE — Telephone Encounter (Signed)
Pt would like lab results.  

## 2012-02-02 NOTE — Telephone Encounter (Signed)
Spoke with pt about recent lab results 

## 2012-02-07 ENCOUNTER — Other Ambulatory Visit: Payer: Self-pay

## 2012-02-07 DIAGNOSIS — E785 Hyperlipidemia, unspecified: Secondary | ICD-10-CM

## 2012-02-07 MED ORDER — ROSUVASTATIN CALCIUM 20 MG PO TABS: 20.0000 mg | ORAL_TABLET | Freq: Every day | ORAL | Status: DC

## 2012-03-26 ENCOUNTER — Encounter (HOSPITAL_COMMUNITY): Payer: Self-pay | Admitting: *Deleted

## 2012-03-26 ENCOUNTER — Emergency Department (HOSPITAL_COMMUNITY)
Admission: EM | Admit: 2012-03-26 | Discharge: 2012-03-26 | Disposition: A | Discharge status: Home / Self Care | Payer: Private Health Insurance - Indemnity | Source: Home / Self Care | Attending: Emergency Medicine | Admitting: Emergency Medicine

## 2012-03-26 DIAGNOSIS — S0502XA Injury of conjunctiva and corneal abrasion without foreign body, left eye, initial encounter: Secondary | ICD-10-CM

## 2012-03-26 DIAGNOSIS — S058X9A Other injuries of unspecified eye and orbit, initial encounter: Secondary | ICD-10-CM

## 2012-03-26 MED ORDER — ERYTHROMYCIN 5 MG/GM OP OINT: TOPICAL_OINTMENT | OPHTHALMIC | Status: DC

## 2012-03-26 MED ORDER — TETRACAINE HCL 0.5 % OP SOLN
OPHTHALMIC | Status: AC
  Filled 2012-03-26: qty 2

## 2012-03-26 NOTE — ED Notes (Signed)
Pt   Reports    Some  Was  Working in  [employer]  And  Such  And   She  Noticed  Irritation in  l  Eye -  She  Says  That  Light  Hurt  Eye  Yesterday  Actually is  Better  Today

## 2012-03-26 NOTE — ED Provider Notes (Signed)
History     CSN: [id number]  Arrival date & time 03/26/12  1750   First MD Initiated Contact with Patient 03/26/12 1805      Chief Complaint  Patient presents with  . Eye Problem    (Consider location/radiation/quality/duration/timing/severity/associated sxs/prior treatment) HPI Comments: It does feel better than yesterday, I was working on my chart yesterday blowing leaves and I think a little branch relieve it my left thigh since then been expressing some discomfort and irritation of the left eye. He was hurting more yesterday today feels somewhat better.  Patient is a 66 y.o. female presenting with eye problem. The history is provided by the patient.  Eye Problem  This is a new problem. The current episode started yesterday. The problem occurs constantly. The problem has not changed since onset.The left eye is affected.The injury mechanism was a direct trauma. The pain is at a severity of 3/10. The pain is mild. There is history of trauma to the eye. There is no known exposure to pink eye. She does not wear contacts. Associated symptoms include blurred vision and foreign body sensation. Pertinent negatives include no numbness, no discharge, no double vision, no photophobia, no tingling, no weakness and no itching. The treatment provided mild relief.    Past Medical History  Diagnosis Date  . Other specified cardiac dysrhythmias   . Undiagnosed cardiac murmurs   . Osteoporosis, unspecified   . Other and unspecified hyperlipidemia   . Coronary artery disease     Past Surgical History  Procedure Date  . Hernia repair   . Breast surgery     Benign tumor removed RT    Family History  Problem Relation Age of Onset  . Heart disease Mother 95    CABG and valve replacement; CHF  . Hypertension Other     History  Substance Use Topics  . Smoking status: Former Games developer  . Smokeless tobacco: Not on file     Comment: Regular Exercise-No  . Alcohol Use: 1.8 oz/week    3 Glasses  of wine per week    OB History    Grav Para Term Preterm Abortions TAB SAB Ect Mult Living                  Review of Systems  Constitutional: Negative for activity change and appetite change.  Eyes: Positive for blurred vision and pain. Negative for double vision, photophobia, discharge and visual disturbance.  Skin: Negative for itching and rash.  Neurological: Negative for dizziness, tingling, weakness, numbness and headaches.    Allergies  Codeine  Home Medications   Current Outpatient Rx  Name  Route  Sig  Dispense  Refill  . ASPIRIN 81 MG PO TABS   Oral   Take 81 mg by mouth daily.           . ATORVASTATIN CALCIUM 40 MG PO TABS      TAKE 1 TABLET BY MOUTH EVERY DAY   30 tablet   3   . CALCIUM PO   Oral   Take by mouth daily.         Marland Kitchen VITAMIN D3 1000 UNITS PO CAPS   Oral   Take 1 capsule by mouth daily.           . ERYTHROMYCIN 5 MG/GM OP OINT      Place a 1/2 inch ribbon of ointment into the lower eyelid of left eye eve and 6 hours x 7 days   1 g  0   . OMEGA-3 FATTY ACIDS 1000 MG PO CAPS   Oral   Take 1 g by mouth daily.           Marland Kitchen NITROGLYCERIN 0.4 MG/SPRAY TL SOLN   Sublingual   Place 1 spray under the tongue as directed.           Marland Kitchen ROSUVASTATIN CALCIUM 20 MG PO TABS   Oral   Take 1 tablet (20 mg total) by mouth daily.   30 tablet   6   . VITAMIN B-1 250 MG PO TABS   Oral   Take 500 mg by mouth daily.             BP 153/68  Pulse 78  Temp 98.3 F (36.8 C) (Oral)  Resp 16  SpO2 99%  Physical Exam  Nursing note and vitals reviewed. Constitutional: She appears well-developed and well-nourished.  Eyes: Conjunctivae normal and EOM are normal. Pupils are equal, round, and reactive to light. Right eye exhibits no discharge. Left eye exhibits no discharge. No scleral icterus.    Skin: No rash noted. No erythema.    ED Course  Procedures (including critical care time)  Labs Reviewed - No data to display No results  found.   1. Corneal abrasion, left       MDM  L corneal abrasion Rx and he is a 1 erythromycin ointment to be used for 7 days instructed patient to followup with ophthalmologist no improvement is noted after 3 days.  Jimmie Molly, MD 03/26/12 1840

## 2012-03-28 ENCOUNTER — Other Ambulatory Visit: Payer: Self-pay | Admitting: Orthopedic Surgery

## 2012-04-04 ENCOUNTER — Encounter (HOSPITAL_BASED_OUTPATIENT_CLINIC_OR_DEPARTMENT_OTHER): Payer: Self-pay | Admitting: *Deleted

## 2012-04-04 NOTE — Progress Notes (Signed)
Pt had cardiac ablation05-sees dr Era Skeen meds-ekg 7/13-last stress test 2010 normal-no problems

## 2012-04-07 ENCOUNTER — Encounter (HOSPITAL_BASED_OUTPATIENT_CLINIC_OR_DEPARTMENT_OTHER): Payer: Self-pay | Admitting: *Deleted

## 2012-04-07 ENCOUNTER — Ambulatory Visit (HOSPITAL_BASED_OUTPATIENT_CLINIC_OR_DEPARTMENT_OTHER)
Admission: RE | Admit: 2012-04-07 | Discharge: 2012-04-07 | Disposition: A | Discharge status: Home / Self Care | Payer: Managed Care, Other (non HMO) | Source: Ambulatory Visit | Attending: Orthopedic Surgery | Admitting: Orthopedic Surgery

## 2012-04-07 ENCOUNTER — Encounter (HOSPITAL_BASED_OUTPATIENT_CLINIC_OR_DEPARTMENT_OTHER)
Admission: RE | Disposition: A | Discharge status: Home / Self Care | Payer: Self-pay | Source: Ambulatory Visit | Attending: Orthopedic Surgery

## 2012-04-07 ENCOUNTER — Ambulatory Visit (HOSPITAL_BASED_OUTPATIENT_CLINIC_OR_DEPARTMENT_OTHER): Payer: Managed Care, Other (non HMO) | Admitting: Anesthesiology

## 2012-04-07 ENCOUNTER — Encounter (HOSPITAL_BASED_OUTPATIENT_CLINIC_OR_DEPARTMENT_OTHER): Payer: Self-pay | Admitting: Anesthesiology

## 2012-04-07 DIAGNOSIS — I499 Cardiac arrhythmia, unspecified: Secondary | ICD-10-CM | POA: Insufficient documentation

## 2012-04-07 DIAGNOSIS — Z8249 Family history of ischemic heart disease and other diseases of the circulatory system: Secondary | ICD-10-CM | POA: Insufficient documentation

## 2012-04-07 DIAGNOSIS — I2510 Atherosclerotic heart disease of native coronary artery without angina pectoris: Secondary | ICD-10-CM | POA: Insufficient documentation

## 2012-04-07 DIAGNOSIS — Z87891 Personal history of nicotine dependence: Secondary | ICD-10-CM | POA: Insufficient documentation

## 2012-04-07 DIAGNOSIS — M810 Age-related osteoporosis without current pathological fracture: Secondary | ICD-10-CM | POA: Insufficient documentation

## 2012-04-07 DIAGNOSIS — M21619 Bunion of unspecified foot: Secondary | ICD-10-CM

## 2012-04-07 DIAGNOSIS — Z7982 Long term (current) use of aspirin: Secondary | ICD-10-CM | POA: Insufficient documentation

## 2012-04-07 HISTORY — PX: BUNIONECTOMY: SHX129

## 2012-04-07 HISTORY — DX: Presence of external hearing-aid: Z97.4

## 2012-04-07 SURGERY — BUNIONECTOMY
Anesthesia: Regional | Site: Foot | Laterality: Left | Wound class: Clean

## 2012-04-07 MED ORDER — FENTANYL CITRATE 0.05 MG/ML IJ SOLN
50.0000 ug | Freq: Once | INTRAMUSCULAR | Status: AC
  Administered 2012-04-07: 100 ug via INTRAVENOUS

## 2012-04-07 MED ORDER — OXYCODONE HCL 5 MG/5ML PO SOLN: 5.0000 mg | Freq: Once | ORAL | Status: DC | PRN

## 2012-04-07 MED ORDER — HYDROMORPHONE HCL PF 1 MG/ML IJ SOLN: 0.2500 mg | INTRAMUSCULAR | Status: DC | PRN

## 2012-04-07 MED ORDER — ACETAMINOPHEN 10 MG/ML IV SOLN
INTRAVENOUS | Status: DC | PRN
  Administered 2012-04-07: 1000 mg via INTRAVENOUS

## 2012-04-07 MED ORDER — LIDOCAINE HCL (CARDIAC) 20 MG/ML IV SOLN
INTRAVENOUS | Status: DC | PRN
  Administered 2012-04-07: 40 mg via INTRAVENOUS

## 2012-04-07 MED ORDER — PROMETHAZINE HCL 25 MG/ML IJ SOLN: 6.2500 mg | INTRAMUSCULAR | Status: DC | PRN

## 2012-04-07 MED ORDER — DEXAMETHASONE SODIUM PHOSPHATE 10 MG/ML IJ SOLN
INTRAMUSCULAR | Status: DC | PRN
  Administered 2012-04-07: 5 mg via INTRAVENOUS

## 2012-04-07 MED ORDER — CEFAZOLIN SODIUM-DEXTROSE 2-3 GM-% IV SOLR
2.0000 g | INTRAVENOUS | Status: AC
  Administered 2012-04-07: 2 g via INTRAVENOUS

## 2012-04-07 MED ORDER — LACTATED RINGERS IV SOLN
INTRAVENOUS | Status: DC
  Administered 2012-04-07 (×2): via INTRAVENOUS

## 2012-04-07 MED ORDER — OXYCODONE HCL 5 MG PO TABS: 5.0000 mg | ORAL_TABLET | Freq: Once | ORAL | Status: DC | PRN

## 2012-04-07 MED ORDER — BUPIVACAINE-EPINEPHRINE PF 0.5-1:200000 % IJ SOLN
INTRAMUSCULAR | Status: DC | PRN
  Administered 2012-04-07: 45 mL

## 2012-04-07 MED ORDER — POVIDONE-IODINE 7.5 % EX SOLN: Freq: Once | CUTANEOUS | Status: DC

## 2012-04-07 MED ORDER — ONDANSETRON HCL 4 MG/2ML IJ SOLN
INTRAMUSCULAR | Status: DC | PRN
  Administered 2012-04-07: 4 mg via INTRAVENOUS

## 2012-04-07 MED ORDER — DEXAMETHASONE SODIUM PHOSPHATE 4 MG/ML IJ SOLN
INTRAMUSCULAR | Status: DC | PRN
  Administered 2012-04-07: 4 mg

## 2012-04-07 MED ORDER — EPHEDRINE SULFATE 50 MG/ML IJ SOLN
INTRAMUSCULAR | Status: DC | PRN
  Administered 2012-04-07 (×2): 5 mg via INTRAVENOUS

## 2012-04-07 MED ORDER — ACETAMINOPHEN 10 MG/ML IV SOLN
1000.0000 mg | Freq: Once | INTRAVENOUS | Status: AC
  Administered 2012-04-07: 1000 mg via INTRAVENOUS

## 2012-04-07 MED ORDER — MIDAZOLAM HCL 2 MG/2ML IJ SOLN
1.0000 mg | INTRAMUSCULAR | Status: DC | PRN
  Administered 2012-04-07: 2 mg via INTRAVENOUS

## 2012-04-07 MED ORDER — PROPOFOL 10 MG/ML IV BOLUS
INTRAVENOUS | Status: DC | PRN
  Administered 2012-04-07: 120 mg via INTRAVENOUS

## 2012-04-07 MED ORDER — OXYCODONE-ACETAMINOPHEN 5-325 MG PO TABS: 1.0000 | ORAL_TABLET | Freq: Four times a day (QID) | ORAL | Status: DC | PRN

## 2012-04-07 SURGICAL SUPPLY — 65 items
BANDAGE CONFORM 2  STR LF (GAUZE/BANDAGES/DRESSINGS) ×2 IMPLANT
BLADE AVERAGE 25X9 (BLADE) ×1 IMPLANT
BLADE OSC/SAG .038X5.5 CUT EDG (BLADE) IMPLANT
BLADE SURG 15 STRL LF DISP TIS (BLADE) ×1 IMPLANT
BLADE SURG 15 STRL SS (BLADE) ×2
BNDG CMPR 9X4 STRL LF SNTH (GAUZE/BANDAGES/DRESSINGS) ×1
BNDG CMPR MD 5X2 ELC HKLP STRL (GAUZE/BANDAGES/DRESSINGS) ×1
BNDG ELASTIC 2 VLCR STRL LF (GAUZE/BANDAGES/DRESSINGS) ×3 IMPLANT
BNDG ESMARK 4X9 LF (GAUZE/BANDAGES/DRESSINGS) ×2 IMPLANT
CANISTER SUCTION 1200CC (MISCELLANEOUS) ×1 IMPLANT
CLOTH BEACON ORANGE TIMEOUT ST (SAFETY) ×2 IMPLANT
COVER TABLE BACK 60X90 (DRAPES) ×2 IMPLANT
CUFF TOURNIQUET SINGLE 18IN (TOURNIQUET CUFF) IMPLANT
DECANTER SPIKE VIAL GLASS SM (MISCELLANEOUS) IMPLANT
DRAPE EXTREMITY T 121X128X90 (DRAPE) ×2 IMPLANT
DRAPE OEC MINIVIEW 54X84 (DRAPES) ×2 IMPLANT
DRAPE U 20/CS (DRAPES) ×2 IMPLANT
DRAPE U-SHAPE 47X51 STRL (DRAPES) ×2 IMPLANT
DRSG EMULSION OIL 3X3 NADH (GAUZE/BANDAGES/DRESSINGS) ×1 IMPLANT
DURAPREP 26ML APPLICATOR (WOUND CARE) ×2 IMPLANT
ELECT REM PT RETURN 9FT ADLT (ELECTROSURGICAL) ×2
ELECTRODE REM PT RTRN 9FT ADLT (ELECTROSURGICAL) ×1 IMPLANT
GAUZE XEROFORM 1X8 LF (GAUZE/BANDAGES/DRESSINGS) IMPLANT
GLOVE BIO SURGEON STRL SZ 6.5 (GLOVE) ×1 IMPLANT
GLOVE BIOGEL PI IND STRL 7.0 (GLOVE) IMPLANT
GLOVE BIOGEL PI IND STRL 8 (GLOVE) ×2 IMPLANT
GLOVE BIOGEL PI INDICATOR 7.0 (GLOVE) ×1
GLOVE BIOGEL PI INDICATOR 8 (GLOVE) ×2
GLOVE ECLIPSE 7.5 STRL STRAW (GLOVE) ×4 IMPLANT
GLOVE EXAM NITRILE EXT CUFF MD (GLOVE) ×1 IMPLANT
GOWN BRE IMP PREV XXLGXLNG (GOWN DISPOSABLE) ×2 IMPLANT
GOWN PREVENTION PLUS XLARGE (GOWN DISPOSABLE) ×2 IMPLANT
GOWN PREVENTION PLUS XXLARGE (GOWN DISPOSABLE) ×2 IMPLANT
KIT 1-PIN ORTHOSORB 1.3X40 (Pin) ×2 IMPLANT
KWIRE 4.0 X .045IN (WIRE) IMPLANT
NDL 1/2 CIR CATGUT .05X1.09 (NEEDLE) IMPLANT
NDL ADDISON D1/2 CIR (NEEDLE) IMPLANT
NDL HYPO 25X1 1.5 SAFETY (NEEDLE) IMPLANT
NEEDLE 1/2 CIR CATGUT .05X1.09 (NEEDLE) IMPLANT
NEEDLE ADDISON D1/2 CIR (NEEDLE) IMPLANT
NEEDLE HYPO 25X1 1.5 SAFETY (NEEDLE) IMPLANT
NS IRRIG 1000ML POUR BTL (IV SOLUTION) ×2 IMPLANT
PACK BASIN DAY SURGERY FS (CUSTOM PROCEDURE TRAY) ×2 IMPLANT
PAD CAST 3X4 CTTN HI CHSV (CAST SUPPLIES) IMPLANT
PADDING CAST ABS 4INX4YD NS (CAST SUPPLIES)
PADDING CAST ABS COTTON 4X4 ST (CAST SUPPLIES) ×1 IMPLANT
PADDING CAST COTTON 3X4 STRL (CAST SUPPLIES)
PADDING UNDERCAST 2  STERILE (CAST SUPPLIES) ×2 IMPLANT
PENCIL BUTTON HOLSTER BLD 10FT (ELECTRODE) ×2 IMPLANT
SPONGE GAUZE 4X4 12PLY (GAUZE/BANDAGES/DRESSINGS) ×2 IMPLANT
STOCKINETTE 4X48 STRL (DRAPES) ×2 IMPLANT
SUCTION FRAZIER TIP 10 FR DISP (SUCTIONS) ×2 IMPLANT
SUT ETHILON 3 0 PS 1 (SUTURE) IMPLANT
SUT ETHILON 4 0 PS 2 18 (SUTURE) ×1 IMPLANT
SUT FIBERWIRE 2-0 18 17.9 3/8 (SUTURE) ×6
SUT VIC AB 2-0 PS2 27 (SUTURE) IMPLANT
SUT VIC AB 3-0 FS2 27 (SUTURE) ×1 IMPLANT
SUTURE FIBERWR 2-0 18 17.9 3/8 (SUTURE) ×1 IMPLANT
SYR BULB 3OZ (MISCELLANEOUS) ×2 IMPLANT
SYR CONTROL 10ML LL (SYRINGE) IMPLANT
TOWEL OR 17X24 6PK STRL BLUE (TOWEL DISPOSABLE) ×2 IMPLANT
TOWEL OR NON WOVEN STRL DISP B (DISPOSABLE) ×2 IMPLANT
TUBE CONNECTING 20X1/4 (TUBING) ×2 IMPLANT
UNDERPAD 30X30 INCONTINENT (UNDERPADS AND DIAPERS) ×2 IMPLANT
WATER STERILE IRR 1000ML POUR (IV SOLUTION) ×1 IMPLANT

## 2012-04-07 NOTE — Anesthesia Postprocedure Evaluation (Signed)
  Anesthesia Post-op Note  Patient: Carrie [name]  Procedure(s) Performed: Procedure(s) (LRB) with comments: BUNIONECTOMY (Left) - CHEVRON OSTEOTOMY LEFT FOOT   Patient Location: PACU  Anesthesia Type:GA combined with regional for post-op pain  Level of Consciousness: awake  Airway and Oxygen Therapy: Patient Spontanous Breathing  Post-op Pain: none  Post-op Assessment: Post-op Vital signs reviewed, Patient's Cardiovascular Status Stable, Respiratory Function Stable, Patent Airway, No signs of Nausea or vomiting and Pain level controlled  Post-op Vital Signs: stable  Complications: No apparent anesthesia complications

## 2012-04-07 NOTE — Brief Op Note (Signed)
04/07/2012  11:43 AM  PATIENT:  Carrie [name]  66 y.o. 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   PROCEDURE:  Procedure(s) (LRB) with comments: BUNIONECTOMY (Left) - CHEVRON OSTEOTOMY LEFT FOOT   SURGEON:  Surgeon(s) and Role:    * Harvie Junior, MD - Primary  PHYSICIAN ASSISTANT:   ASSISTANTS: bethune   ANESTHESIA:   general  EBL:  Total I/O In: 1200 [I.V.:1200] Out: -   BLOOD ADMINISTERED:none  DRAINS: none   LOCAL MEDICATIONS USED:  MARCAINE     SPECIMEN:  No Specimen  DISPOSITION OF SPECIMEN:  N/A  COUNTS:  YES  TOURNIQUET:   Total Tourniquet Time Documented: Calf (Left) - 76 minutes  DICTATION: .Other Dictation: Dictation Number [id number]  PLAN OF CARE: Discharge to home after PACU  PATIENT DISPOSITION:  PACU - hemodynamically stable.   Delay start of Pharmacological VTE agent (>24hrs) due to surgical blood loss or risk of bleeding: no

## 2012-04-07 NOTE — Progress Notes (Signed)
Assisted Dr. Kasik with left, popliteal block. Side rails up, monitors on throughout procedure. See vital signs in flow sheet. Tolerated Procedure well. 

## 2012-04-07 NOTE — H&P (Signed)
PREOPERATIVE H&P  Chief Complaint: l foot pain  HPI: Carrie [name] is a 66 y.o. female who presents for evaluation of l foot pain. It has been present for greater than 1 year and has been worsening. She has failed conservative measures. Pain is rated as moderate.  Past Medical History  Diagnosis Date  . Other specified cardiac dysrhythmias   . Undiagnosed cardiac murmurs   . Osteoporosis, unspecified   . Other and unspecified hyperlipidemia   . Coronary artery disease   . Contact lens/glasses fitting     contacts or gl;asses  . Wears hearing aid     left   Past Surgical History  Procedure Date  . Breast surgery 1982    Benign tumor removed RT  . Hernia repair 1990    rt ing  . Ganglion cyst excision     left wrist  . Tonsillectomy   . Tubal ligation   . Cardiac catheterization 2005  . Atrial ablation surgery 2005   History   Social History  . Marital Status: Divorced    Spouse Name: N/A    Number of Children: N/A  . Years of Education: N/A   Social History Main Topics  . Smoking status: Former Smoker    Quit date: 04/05/1999  . Smokeless tobacco: None     Comment: Regular Exercise-No  . Alcohol Use: 1.8 oz/week    3 Glasses of wine per week  . Drug Use: No  . Sexually Active: Not Currently   Other Topics Concern  . None   Social History Narrative  . None   Family History  Problem Relation Age of Onset  . Heart disease Mother 11    CABG and valve replacement; CHF  . Hypertension Other    Allergies  Allergen Reactions  . Codeine    Prior to Admission medications   Medication Sig Start Date End Date Taking? Authorizing Provider  aspirin 81 MG tablet Take 81 mg by mouth daily.     Yes Historical Provider, MD  CALCIUM PO Take by mouth daily.   Yes Historical Provider, MD  Cholecalciferol (VITAMIN D3) 1000 UNITS CAPS Take 1 capsule by mouth daily.     Yes Historical Provider, MD  fish oil-omega-3 fatty acids 1000 MG capsule Take 1 g by mouth  daily.     Yes Historical Provider, MD  rosuvastatin (CRESTOR) 20 MG tablet Take 1 tablet (20 mg total) by mouth daily. 02/07/12  Yes Laurey Morale, MD  Thiamine HCl (VITAMIN B-1) 250 MG tablet Take 500 mg by mouth daily.     Yes Historical Provider, MD  nitroGLYCERIN (NITROLINGUAL) 0.4 MG/SPRAY spray Place 1 spray under the tongue as directed.      Historical Provider, MD     Positive ROS: none  All other systems have been reviewed and were otherwise negative with the exception of those mentioned in the HPI and as above.  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  Predicted outcome is good, although there will be at least a six to nine month expected recovery.  Rayquan Amrhein L, MD 04/07/2012 9:43 AM

## 2012-04-07 NOTE — Transfer of Care (Signed)
Immediate Anesthesia Transfer of Care Note  Patient: Carrie [name]  Procedure(s) Performed: Procedure(s) (LRB) with comments: BUNIONECTOMY (Left) - CHEVRON OSTEOTOMY LEFT FOOT   Patient Location: PACU  Anesthesia Type:GA combined with regional for post-op pain  Level of Consciousness: sedated  Airway & Oxygen Therapy: Patient Spontanous Breathing and Patient connected to face mask oxygen  Post-op Assessment: Report given to PACU RN and Post -op Vital signs reviewed and stable  Post vital signs: Reviewed and stable  Complications: No apparent anesthesia complications

## 2012-04-07 NOTE — Anesthesia Preprocedure Evaluation (Signed)
Anesthesia Evaluation  Patient identified by MRN, date of birth, ID band Patient awake    Reviewed: Allergy & Precautions, H&P , NPO status , Patient's Chart, lab work & pertinent test results  Airway Mallampati: I TM Distance: >3 FB Neck ROM: Full    Dental   Pulmonary  breath sounds clear to auscultation        Cardiovascular + CAD + dysrhythmias + Valvular Problems/Murmurs AS Rhythm:Regular Rate:Normal     Neuro/Psych    GI/Hepatic   Endo/Other    Renal/GU      Musculoskeletal   Abdominal   Peds  Hematology   Anesthesia Other Findings   Reproductive/Obstetrics                           Anesthesia Physical Anesthesia Plan  ASA: III  Anesthesia Plan: General   Post-op Pain Management:    Induction: Intravenous  Airway Management Planned: LMA  Additional Equipment:   Intra-op Plan:   Post-operative Plan: Extubation in OR  Informed Consent: I have reviewed the patients History and Physical, chart, labs and discussed the procedure including the risks, benefits and alternatives for the proposed anesthesia with the patient or authorized representative who has indicated his/her understanding and acceptance.     Plan Discussed with: CRNA and Surgeon  Anesthesia Plan Comments:         Anesthesia Quick Evaluation

## 2012-04-07 NOTE — Anesthesia Procedure Notes (Addendum)
Anesthesia Regional Block:  Popliteal block  Pre-Anesthetic Checklist: ,, timeout performed, Correct Patient, Correct Site, Correct Laterality, Correct Procedure, Correct Position, site marked, Risks and benefits discussed,  Surgical consent,  Pre-op evaluation,  At surgeon's request and post-op pain management  Laterality: Left  Prep: chloraprep       Needles:  Injection technique: Single-shot  Needle Type: Echogenic Stimulator Needle      Needle Gauge: 22 and 22 G    Additional Needles:  Procedures: ultrasound guided (picture in chart) and nerve stimulator Popliteal block  Nerve Stimulator or Paresthesia:  Response: 0.48 mA,   Additional Responses:   Narrative:  Start time: 04/07/2012 9:18 AM End time: 04/07/2012 9:30 AM Injection made incrementally with aspirations every 5 mL. Anesthesiologist: Dr Gypsy Balsam  Additional Notes: 1191-4782 POP L Pop N Block #22 stim/echo needle lat approach with good [place]  Visualization and stim down to .48ma Multiple neg asp Marc .5% w/epi 45cc+decadron 4mg  infiltrated No compl Dr Gypsy Balsam   Procedure Name: LMA Insertion Date/Time: 04/07/2012 9:57 AM Performed by: Burna Cash Pre-anesthesia Checklist: Patient identified, Emergency Drugs available, Suction available and Patient being monitored Patient Re-evaluated:Patient Re-evaluated prior to inductionOxygen Delivery Method: Circle System Utilized Preoxygenation: Pre-oxygenation with 100% oxygen Intubation Type: IV induction Ventilation: Mask ventilation without difficulty LMA: LMA inserted LMA Size: 4.0 Number of attempts: 1 Airway Equipment and Method: bite block Placement Confirmation: positive ETCO2 Tube secured with: Tape Dental Injury: Teeth and Oropharynx as per pre-operative assessment

## 2012-04-10 ENCOUNTER — Encounter (HOSPITAL_BASED_OUTPATIENT_CLINIC_OR_DEPARTMENT_OTHER): Payer: Self-pay | Admitting: Orthopedic Surgery

## 2012-04-10 NOTE — Op Note (Signed)
Carrie [name], Carrie [name]              ACCOUNT NO.:  [id number]  MEDICAL RECORD NO.:  [id number]  LOCATION:                                 FACILITY:  PHYSICIAN:  Harvie Junior, M.D.   DATE OF BIRTH:  [birth year]  DATE OF PROCEDURE:  04/07/2012 DATE OF DISCHARGE:                              OPERATIVE REPORT   PREOPERATIVE DIAGNOSIS:  Significant bunion deformity of the left foot with pain on range of motion and difficulty with shoe wear.  POSTOPERATIVE DIAGNOSIS:  Significant bunion deformity of the left foot with pain on range of motion and difficulty with shoe wear.  PRINCIPAL PROCEDURES: 1. Right bunion repair by way of distal chevron osteotomy. 2. Release of flexor tendon to toe ( adductor Halluxis  SURGEON:  Harvie Junior, M.D.  ASSISTANT:  Marshia Ly, P.A.  ANESTHESIA:  General.  BRIEF HISTORY:  Carrie [name] is a 66 year old female with a long history of having had significant bunion deformity of the left foot.  We treated conservatively for a period of time.  After failure of all conservative care, she was taken to the operating room for repair of her left bunion intermetatarsal angle preoperatively, it was 13 degrees and she had about a 25-degree hallux valgus angle.  We talked about proximal osteotomy, but felt that this could be managed with a distal Chevron and then it would be a little bit easier for her to get over and so after long discussion of this, we chose to do and she was taken to the operating room for this procedure.  PROCEDURE:  The patient was taken to the operating room and after adequate level of anesthesia was obtained with general anesthetic, the patient was placed supine on the operating table.  Left leg was prepped and draped in usual sterile fashion.  Following this, the leg was exsanguinated.  Blood pressure tourniquet was inflated to 250 mmHg. Following this, an incision was made in the 1-2 interspace. Subcutaneous tissue was carefully  dissected down to the capsular tissue at the metatarsophalangeal joint and the adductor hallucis was identified and released.  Then, the capsule of the metatarsophalangeal joint was waffled and then the adductor was sewn between the metatarsal head of the first and second metatarsals.  Following this, attention was turned to the medial side of the foot where an incision was made to subcutaneous tissue down to level of the capsule.  A 5-mm capsular segment was then removed both proximally and distally and then the proximal portion of this was released.  Attention was turned down to the bump, which was then removed.  Midpoint of the head was then identified and at 60-degree angles, a distal chevron incision was made with a saw. Once this was completed, attention was turned towards pushing the head back over into alignment and once we did this, we fixed it with a bioabsorbable pin, got excellent fixation.  This was done on fluoroscopic guidance.  Once this was completed and the metatarsal heads were held in a narrowed position, the remaining bone was removed and the attention at this time was turned towards capsular closure.  The capsule was closed with four interrupted pants-over-vest stitches and  the capsule with 2-0 FiberWire.  Excellent repair of the capsule was achieved at this point.  The toe was perfectly straight.  The gaps were closed.  Fluoro was used to make sure that we had done all of these cuts appropriately and that we had excellent alignment of the osteotomy at this point.  Once this was completed, attention was turned towards reclosure of remainder of the skin, so the wounds were copiously and thoroughly irrigated and suctioned dry.  The skin was then closed with 4- 0 nylon running on the medial side and interrupted stitches in the 1-2 web space.  Sterile compressive dressing was applied as well as a bunion dressing and the patient was taken to the recovery room, and she  was noted to be in satisfactory condition.  Estimated blood loss for the procedure was none.     Harvie Junior, M.D.     Ranae Plumber  D:  04/07/2012  T:  04/07/2012  Job:  [id number]

## 2012-04-11 ENCOUNTER — Other Ambulatory Visit: Payer: Private Health Insurance - Indemnity

## 2012-04-17 ENCOUNTER — Other Ambulatory Visit (INDEPENDENT_AMBULATORY_CARE_PROVIDER_SITE_OTHER): Payer: Managed Care, Other (non HMO)

## 2012-04-17 ENCOUNTER — Other Ambulatory Visit: Payer: Self-pay

## 2012-04-17 DIAGNOSIS — E785 Hyperlipidemia, unspecified: Secondary | ICD-10-CM

## 2012-04-17 LAB — HEPATIC FUNCTION PANEL
ALT: 32 U/L (ref 0–35)
AST: 29 U/L (ref 0–37)
Albumin: 3.9 g/dL (ref 3.5–5.2)
Total Protein: 7.1 g/dL (ref 6.0–8.3)

## 2012-04-17 LAB — LIPID PANEL
Cholesterol: 195 mg/dL (ref 0–200)
HDL: 64.7 mg/dL (ref >39.00)
Triglycerides: 117 mg/dL (ref 0.0–149.0)
VLDL: 23.4 mg/dL (ref 0.0–40.0)

## 2012-04-18 ENCOUNTER — Telehealth: Payer: Self-pay | Admitting: *Deleted

## 2012-04-18 DIAGNOSIS — E785 Hyperlipidemia, unspecified: Secondary | ICD-10-CM

## 2012-04-18 MED ORDER — ROSUVASTATIN CALCIUM 40 MG PO TABS: 40.0000 mg | ORAL_TABLET | Freq: Every day | ORAL | Status: DC

## 2012-04-18 NOTE — Telephone Encounter (Signed)
Message copied by Burnell Blanks on Tue Apr 18, 2012  8:14 AM ------      Message from: Laurey Morale      Created: Mon Apr 17, 2012  9:35 PM       LDL could be a litle lower.  Would consider going up on crestor to 40.  Lipids/LFTs 2 months.

## 2012-04-18 NOTE — Telephone Encounter (Signed)
Advised patient of lab results and medication change  

## 2012-06-20 ENCOUNTER — Other Ambulatory Visit (INDEPENDENT_AMBULATORY_CARE_PROVIDER_SITE_OTHER): Payer: Managed Care, Other (non HMO)

## 2012-06-20 ENCOUNTER — Other Ambulatory Visit: Payer: Managed Care, Other (non HMO)

## 2012-06-20 DIAGNOSIS — E785 Hyperlipidemia, unspecified: Secondary | ICD-10-CM

## 2012-06-20 LAB — HEPATIC FUNCTION PANEL
ALT: 22 U/L (ref 0–35)
AST: 23 U/L (ref 0–37)
Albumin: 3.9 g/dL (ref 3.5–5.2)
Alkaline Phosphatase: 53 U/L (ref 39–117)
Total Protein: 7.4 g/dL (ref 6.0–8.3)

## 2012-06-20 LAB — LIPID PANEL
Cholesterol: 215 mg/dL — ABNORMAL HIGH (ref 0–200)
Total CHOL/HDL Ratio: 3
Triglycerides: 115 mg/dL (ref 0.0–149.0)

## 2012-06-20 LAB — LDL CHOLESTEROL, DIRECT: Direct LDL: 120.6 mg/dL

## 2012-06-22 ENCOUNTER — Other Ambulatory Visit (INDEPENDENT_AMBULATORY_CARE_PROVIDER_SITE_OTHER): Payer: Managed Care, Other (non HMO)

## 2012-06-22 ENCOUNTER — Encounter: Payer: Self-pay | Admitting: Internal Medicine

## 2012-06-22 ENCOUNTER — Ambulatory Visit (INDEPENDENT_AMBULATORY_CARE_PROVIDER_SITE_OTHER): Payer: Managed Care, Other (non HMO) | Admitting: Internal Medicine

## 2012-06-22 VITALS — BP 148/86 | HR 80 | Temp 97.4°F | Resp 16 | Wt 134.0 lb

## 2012-06-22 DIAGNOSIS — R5383 Other fatigue: Secondary | ICD-10-CM | POA: Insufficient documentation

## 2012-06-22 DIAGNOSIS — N318 Other neuromuscular dysfunction of bladder: Secondary | ICD-10-CM

## 2012-06-22 DIAGNOSIS — R5381 Other malaise: Secondary | ICD-10-CM

## 2012-06-22 DIAGNOSIS — Z0000 Encounter for general adult medical examination without abnormal findings: Secondary | ICD-10-CM | POA: Insufficient documentation

## 2012-06-22 DIAGNOSIS — Z23 Encounter for immunization: Secondary | ICD-10-CM

## 2012-06-22 DIAGNOSIS — N3281 Overactive bladder: Secondary | ICD-10-CM | POA: Insufficient documentation

## 2012-06-22 DIAGNOSIS — E785 Hyperlipidemia, unspecified: Secondary | ICD-10-CM

## 2012-06-22 LAB — COMPREHENSIVE METABOLIC PANEL
AST: 19 U/L (ref 0–37)
Albumin: 3.9 g/dL (ref 3.5–5.2)
BUN: 28 mg/dL — ABNORMAL HIGH (ref 6–23)
Calcium: 9.3 mg/dL (ref 8.4–10.5)
Chloride: 107 mEq/L (ref 96–112)
Creatinine, Ser: 0.6 mg/dL (ref 0.4–1.2)
Glucose, Bld: 83 mg/dL (ref 70–99)

## 2012-06-22 LAB — CBC WITH DIFFERENTIAL/PLATELET
Basophils Relative: 0.6 % (ref 0.0–3.0)
Eosinophils Absolute: 0.1 10*3/uL (ref 0.0–0.7)
Eosinophils Relative: 1.4 % (ref 0.0–5.0)
Hemoglobin: 14.1 g/dL (ref 12.0–15.0)
Lymphocytes Relative: 26.4 % (ref 12.0–46.0)
MCHC: 34.2 g/dL (ref 30.0–36.0)
Monocytes Relative: 8.9 % (ref 3.0–12.0)
Neutro Abs: 5.2 10*3/uL (ref 1.4–7.7)
Neutrophils Relative %: 62.7 % (ref 43.0–77.0)
RBC: 4.61 Mil/uL (ref 3.87–5.11)
WBC: 8.3 10*3/uL (ref 4.5–10.5)

## 2012-06-22 LAB — URINALYSIS, ROUTINE W REFLEX MICROSCOPIC
Ketones, ur: NEGATIVE
Total Protein, Urine: NEGATIVE
Urine Glucose: NEGATIVE

## 2012-06-22 MED ORDER — MIRABEGRON ER 25 MG PO TB24: 25.0000 mg | ORAL_TABLET | Freq: Every day | ORAL | Status: DC

## 2012-06-22 NOTE — Assessment & Plan Note (Signed)
I will check her labs to see if there is an organic cause for fatigue

## 2012-06-22 NOTE — Progress Notes (Signed)
  Subjective:    Patient ID: Carrie [name], female    DOB: [birth year], 67 y.o.   MRN: [id number]  HPI  She returns c/o of severe fatigue for about 3-4 weeks with no causative factor. She gets up frequently at night to urinate and during the day she has urinary frequency and the feeling of incomplete emptying.  Review of Systems  Constitutional: Positive for fatigue. Negative for fever, chills, diaphoresis, activity change, appetite change and unexpected weight change.  HENT: Negative.   Eyes: Negative.   Respiratory: Negative for apnea, cough, chest tightness, shortness of breath, wheezing and stridor.   Cardiovascular: Negative for chest pain, palpitations and leg swelling.  Gastrointestinal: Negative for nausea, vomiting, abdominal pain, diarrhea and constipation.  Genitourinary: Positive for frequency. Negative for dysuria, urgency, hematuria, flank pain, decreased urine volume, enuresis, difficulty urinating, pelvic pain and dyspareunia.  Musculoskeletal: Negative for myalgias, back pain, joint swelling and gait problem.  Skin: Negative.   Hematological: Negative for adenopathy. Does not bruise/bleed easily.  Psychiatric/Behavioral: Negative for suicidal ideas, sleep disturbance and decreased concentration. The patient is not nervous/anxious.        Objective:   Physical Exam  Vitals reviewed. Constitutional: She is oriented to person, place, and time. She appears well-developed and well-nourished. No distress.  HENT:  Head: Normocephalic and atraumatic.  Mouth/Throat: Oropharynx is clear and moist. No oropharyngeal exudate.  Eyes: Conjunctivae are normal. Right eye exhibits no discharge. Left eye exhibits no discharge. No scleral icterus.  Neck: Normal range of motion. Neck supple. No JVD present. No tracheal deviation present. No thyromegaly present.  Cardiovascular: Normal rate, regular rhythm, S1 normal, S2 normal and intact distal pulses.  Exam reveals no gallop, no S3, no S4  and no friction rub.   Murmur heard.  Decrescendo systolic murmur is present with a grade of 2/6   No diastolic murmur is present  Pulmonary/Chest: Effort normal and breath sounds normal. No stridor. No respiratory distress. She has no wheezes. She has no rales. She exhibits no tenderness.  Abdominal: Soft. Bowel sounds are normal. She exhibits no distension and no mass. There is no tenderness. There is no rebound and no guarding.  Musculoskeletal: Normal range of motion. She exhibits no edema and no tenderness.  Lymphadenopathy:    She has no cervical adenopathy.  Neurological: She is oriented to person, place, and time.  Skin: Skin is warm and dry. No rash noted. She is not diaphoretic. No erythema. No pallor.  Psychiatric: She has a normal mood and affect. Her behavior is normal. Judgment and thought content normal.     Lab Results  Component Value Date   WBC 6.6 08/28/2010   HGB 14.2 08/28/2010   HCT 40.5 08/28/2010   PLT 300.0 08/28/2010   GLUCOSE 97 11/11/2011   CHOL 215* 06/20/2012   TRIG 115.0 06/20/2012   HDL 68.20 06/20/2012   LDLDIRECT 120.6 06/20/2012   LDLCALC 107* 04/17/2012   ALT 22 06/20/2012   AST 23 06/20/2012   NA 140 11/11/2011   K 4.7 11/11/2011   CL 105 11/11/2011   CREATININE 0.7 11/11/2011   BUN 17 11/11/2011   CO2 28 11/11/2011   TSH 1.04 08/28/2010       Assessment & Plan:

## 2012-06-22 NOTE — Patient Instructions (Signed)
Overactive Bladder, Adult The bladder has two functions that are totally opposite of the other. One is to relax and stretch out so it can store urine (fills like a balloon), and the other is to contract and squeeze down so that it can empty the urine that it has stored. Proper functioning of the bladder is a complex mixing of these two functions. The filling and emptying of the bladder can be influenced by:  The bladder.  The spinal cord.  The brain.  The nerves going to the bladder.  Other organs that are closely related to the bladder such as prostate in males and the vagina in females. As your bladder fills with urine, nerve signals are sent from the bladder to the brain to tell you that you may need to urinate. Normal urination requires that the bladder squeeze down with sufficient strength to empty the bladder, but this also requires that the bladder squeeze down sufficiently long to finish the job. In addition the sphincter muscles, which normally keep you from leaking urine, must also relax so that the urine can pass. Coordination between the bladder muscle squeezing down and the sphincter muscles relaxing is required to make everything happen normally. With an overactive bladder sometimes the muscles of the bladder contract unexpectedly and involuntarily and this causes an urgent need to urinate. The normal response is to try to hold urine in by contracting the sphincter muscles. Sometimes the bladder contracts so strongly that the sphincter muscles cannot stop the urine from passing out and incontinence occurs. This kind of incontinence is called urge incontinence. Having an overactive bladder can be embarrassing and awkward. It can keep you from living life the way you want to. Many people think it is just something you have to put up with as you grow older or have certain health conditions. In fact, there are treatments that can help make your life easier and more pleasant. CAUSES  Many  things can cause an overactive bladder. Possibilities include:  Urinary tract infection or infection of nearby tissues such as the prostate.  Prostate enlargement.  In women, multiple pregnancies or surgery on the uterus or urethra.  Bladder stones, inflammation or tumors.  Caffeine.  Alcohol.  Medications. For example, diuretics (drugs that help the body get rid of extra fluid) increase urine production. Some other medicines must be taken with lots of fluids.  Muscle or nerve weakness. This might be the result of a spinal cord injury, a stroke, multiple sclerosis or Parkinson's disease.  Diabetes can cause a high urine volume which fills the bladder so quickly that the normal urge to urinate is triggered very strongly. SYMPTOMS   Loss of bladder control. You feel the need to urinate and cannot make your body wait.  Sudden, strong urges to urinate.  Urinating 8 or more times a day.  Waking up to urinate two or more times a night. DIAGNOSIS  To decide if you have overactive bladder, your healthcare provider will probably:  Ask about symptoms you have noticed.  Ask about your overall health. This will include questions about any medications you are taking.  Do a physical examination. This will help determine if there are obvious blockages or other problems.  Order some tests. These might include:  A blood test to check for diabetes or other health issues that could be contributing to the problem.  Urine testing. This could measure the flow of urine and the pressure on the bladder.  A test of your neurological   system (the brain, spinal cord and nerves). This is the system that senses the need to urinate. Some of these tests are called flow tests, bladder pressure tests and electrical measurements of the sphincter muscle.  A bladder test to check whether it is emptying completely when you urinate.  Cytoscopy. This test uses a thin tube with a tiny camera on it. It offers a  look inside your urethra and bladder to see if there are problems.  Imaging tests. You might be given a contrast dye and then asked to urinate. X-rays are taken to see how your bladder is working. TREATMENT  An overactive bladder can be treated in many ways. The treatment will depend on the cause. Whether you have a mild or severe case also makes a difference. Often, treatment can be given in your healthcare provider's office or clinic. Be sure to discuss the different options with your caregiver. They include:  Behavioral treatments. These do not involve medication or surgery:  Bladder training. For this, you would follow a schedule to urinate at regular intervals. This helps you learn to control the urge to urinate. At first, you might be asked to wait a few minutes after feeling the urge. In time, you should be able to schedule bathroom visits an hour or more apart.  Kegel exercises. These exercises strengthen the pelvic floor muscles, which support the bladder. By toning these muscles, they can help control urination, even if the bladder muscles are overactive. A specialist will teach you how to do these exercises correctly. They will require daily practice.  Weight loss. If you are obese or overweight, losing weight might stop your bladder from being overactive. Talk to your healthcare provider about how many pounds you should lose. Also ask if there is a specific program or method that would work best for you.  Diet change. This might be suggested if constipation is making your overactive bladder worse. Your healthcare provider or a nutritionist can explain ways to change what you eat to ease constipation. Other people might need to take in less caffeine or alcohol. Sometimes drinking fewer fluids is needed, too.  Protection. This is not an actual treatment. But, you could wear special pads to take care of any leakage while you wait for other treatments to take effect. This will help you avoid  embarrassment.  Physical treatments.  Electrical stimulation. Electrodes will send gentle pulses to the nerves or muscles that help control the bladder. The goal is to strengthen them. Sometimes this is done with the electrodes outside of the body. Or, they might be placed inside the body (implanted). This treatment can take several months to have an effect.  Medications. These are usually used along with other treatments. Several medicines are available. Some are injected into the muscles involved in urination. Others come in pill form. Medications sometimes prescribed include:  Anticholinergics. These drugs block the signals that the nerves deliver to the bladder. This keeps it from releasing urine at the wrong time. Researchers think the drugs might help in other ways, too.  Imipramine. This is an antidepressant. But, it relaxes bladder muscles.  Botox. This is still experimental. Some people believe that injecting it into the bladder muscles will relax them so they work more normally. It has also been injected into the sphincter muscle when the sphincter muscle does not open properly. This is a temporary fix, however. Also, it might make matters worse, especially in older people.  Surgery.  A device might be implanted   to help manage your nerves. It works on the nerves that signal when you need to urinate.  Surgery is sometimes needed with electrical stimulation. If the electrodes are implanted, this is done through surgery.  Sometimes repairs need to be made through surgery. For example, the size of the bladder can be changed. This is usually done in severe cases only. HOME CARE INSTRUCTIONS   Take any medications your healthcare provider prescribed or suggested. Follow the directions carefully.  Practice any lifestyle changes that are recommended. These might include:  Drinking less fluid or drinking at different times of the day. If you need to urinate often during the night, for  example, you may need to stop drinking fluids early in the evening.  Cutting down on caffeine or alcohol. They can both make an overactive bladder worse. Caffeine is found in coffee, tea and sodas.  Doing Kegel exercises to strengthen muscles.  Losing weight, if that is recommended.  Eating a healthy and balanced diet. This will help you avoid constipation.  Keep a journal or a log. You might be asked to record how much you drink and when, and also when you feel the need to urinate.  Learn how to care for implants or other devices, such as pessaries. SEEK MEDICAL CARE IF:   Your overactive bladder gets worse.  You feel increased pain or irritation when you urinate.  You notice blood in your urine.  You have questions about any medications or devices that your healthcare provider recommended.  You notice blood, pus or swelling at the site of any test or treatment procedure.  You have an oral temperature above 102 F (38.9 C). SEEK IMMEDIATE MEDICAL CARE IF:  You have an oral temperature above 102 F (38.9 C), not controlled by medicine. Document Released: 02/06/2009 Document Revised: 07/05/2011 Document Reviewed: 02/06/2009 [employer] Patient Information 2013 [employer].  

## 2012-06-22 NOTE — Assessment & Plan Note (Signed)
I will check her UA today to see if there is blood, infection, or protein She will try myrbetriq for symptom relief

## 2012-06-23 ENCOUNTER — Telehealth: Payer: Self-pay | Admitting: Cardiology

## 2012-06-23 ENCOUNTER — Other Ambulatory Visit: Payer: Self-pay | Admitting: *Deleted

## 2012-06-23 NOTE — Telephone Encounter (Signed)
Pt rtn call from Debbie yesterday to get test results

## 2012-06-23 NOTE — Telephone Encounter (Signed)
Spoke with pt about recent lab results 

## 2012-07-19 ENCOUNTER — Encounter: Payer: Self-pay | Admitting: Internal Medicine

## 2012-07-19 ENCOUNTER — Ambulatory Visit (INDEPENDENT_AMBULATORY_CARE_PROVIDER_SITE_OTHER): Payer: Managed Care, Other (non HMO) | Admitting: Internal Medicine

## 2012-07-19 VITALS — BP 138/78 | HR 89 | Temp 98.0°F | Resp 16 | Wt 133.8 lb

## 2012-07-19 DIAGNOSIS — J209 Acute bronchitis, unspecified: Secondary | ICD-10-CM

## 2012-07-19 MED ORDER — PROMETHAZINE-DM 6.25-15 MG/5ML PO SYRP: 5.0000 mL | ORAL_SOLUTION | Freq: Four times a day (QID) | ORAL | Status: DC | PRN

## 2012-07-19 MED ORDER — AZITHROMYCIN 500 MG PO TABS: 500.0000 mg | ORAL_TABLET | Freq: Every day | ORAL | Status: DC

## 2012-07-19 NOTE — Assessment & Plan Note (Signed)
Start zpak for the infection and a cough suppressant 

## 2012-07-19 NOTE — Progress Notes (Signed)
  Subjective:    Patient ID: Carrie [name], female    DOB: 10/28/1945, 66 y.o.   MRN: [id number]  Cough This is a new problem. The current episode started in the past 7 days. The problem has been unchanged. The problem occurs constantly. The cough is non-productive. Associated symptoms include chills, rhinorrhea and a sore throat. Pertinent negatives include no chest pain, ear congestion, ear pain, fever, headaches, heartburn, hemoptysis, myalgias, nasal congestion, postnasal drip, rash, shortness of breath, sweats, weight loss or wheezing. She has tried nothing for the symptoms. There is no history of asthma, bronchiectasis, bronchitis, COPD, emphysema, environmental allergies or pneumonia.      Review of Systems  Constitutional: Positive for chills and fatigue. Negative for fever, weight loss, diaphoresis, activity change, appetite change and unexpected weight change.  HENT: Positive for sore throat and rhinorrhea. Negative for ear pain, facial swelling, mouth sores, trouble swallowing, neck pain, voice change, postnasal drip and sinus pressure.   Eyes: Negative.   Respiratory: Positive for cough. Negative for apnea, hemoptysis, choking, chest tightness, shortness of breath, wheezing and stridor.   Cardiovascular: Negative for chest pain.  Gastrointestinal: Negative for heartburn, nausea, vomiting, abdominal pain, diarrhea and constipation.  Endocrine: Negative.  Negative for cold intolerance.  Genitourinary: Negative.   Musculoskeletal: Negative.  Negative for myalgias, back pain, joint swelling, arthralgias and gait problem.  Skin: Negative.  Negative for color change, pallor, rash and wound.  Allergic/Immunologic: Negative.  Negative for environmental allergies.  Neurological: Negative.  Negative for dizziness, weakness, light-headedness, numbness and headaches.  Hematological: Negative.  Negative for adenopathy. Does not bruise/bleed easily.  Psychiatric/Behavioral: Negative.         Objective:   Physical Exam  Vitals reviewed. Constitutional: She is oriented to person, place, and time. Vital signs are normal. She appears well-developed and well-nourished.  Non-toxic appearance. She does not have a sickly appearance. She does not appear ill. No distress.  HENT:  Head: Normocephalic and atraumatic.  Mouth/Throat: Oropharynx is clear and moist. No oropharyngeal exudate.  Eyes: Conjunctivae are normal. Right eye exhibits no discharge. Left eye exhibits no discharge. No scleral icterus.  Neck: Normal range of motion. Neck supple. No JVD present. No tracheal deviation present. No thyromegaly present.  Cardiovascular: Normal rate, regular rhythm, normal heart sounds and intact distal pulses.  Exam reveals no gallop and no friction rub.   No murmur heard. Pulmonary/Chest: Effort normal and breath sounds normal. No stridor. No respiratory distress. She has no wheezes. She has no rales. She exhibits no tenderness.  Abdominal: Soft. Bowel sounds are normal. She exhibits no distension and no mass. There is no tenderness. There is no rebound and no guarding.  Musculoskeletal: Normal range of motion. She exhibits no edema and no tenderness.  Lymphadenopathy:    She has no cervical adenopathy.  Neurological: She is oriented to person, place, and time.  Skin: Skin is warm and dry. No rash noted. She is not diaphoretic. No erythema. No pallor.  Psychiatric: She has a normal mood and affect. Her behavior is normal. Judgment and thought content normal.          Assessment & Plan:

## 2012-07-19 NOTE — Patient Instructions (Signed)

## 2012-07-28 ENCOUNTER — Telehealth: Payer: Self-pay

## 2012-07-28 NOTE — Telephone Encounter (Signed)
Carrie [name] has ordered it

## 2012-07-28 NOTE — Telephone Encounter (Signed)
Pt called requesting status of Prolia injection. Last injection was 12/2012 and pt's states she discussed continuing therapy with MD at last OV.

## 2012-08-03 ENCOUNTER — Ambulatory Visit (INDEPENDENT_AMBULATORY_CARE_PROVIDER_SITE_OTHER): Payer: Managed Care, Other (non HMO)

## 2012-08-03 DIAGNOSIS — M810 Age-related osteoporosis without current pathological fracture: Secondary | ICD-10-CM

## 2012-08-03 MED ORDER — DENOSUMAB 60 MG/ML ~~LOC~~ SOLN
60.0000 mg | Freq: Once | SUBCUTANEOUS | Status: AC
  Administered 2012-08-03: 60 mg via SUBCUTANEOUS

## 2012-08-22 ENCOUNTER — Ambulatory Visit (INDEPENDENT_AMBULATORY_CARE_PROVIDER_SITE_OTHER): Payer: Managed Care, Other (non HMO) | Admitting: Internal Medicine

## 2012-08-22 ENCOUNTER — Encounter: Payer: Self-pay | Admitting: Internal Medicine

## 2012-08-22 VITALS — BP 130/82 | HR 78 | Temp 98.0°F

## 2012-08-22 DIAGNOSIS — S30860A Insect bite (nonvenomous) of lower back and pelvis, initial encounter: Secondary | ICD-10-CM

## 2012-08-22 DIAGNOSIS — W57XXXA Bitten or stung by nonvenomous insect and other nonvenomous arthropods, initial encounter: Secondary | ICD-10-CM

## 2012-08-22 MED ORDER — DOXYCYCLINE HYCLATE 100 MG PO TABS: 100.0000 mg | ORAL_TABLET | Freq: Two times a day (BID) | ORAL | Status: DC

## 2012-08-22 NOTE — Patient Instructions (Signed)
Deer Tick Bite Deer ticks are brown arachnids (spider family) that vary in size from as small as the head of a pin to 1/4 inch (1/2 cm) diameter. They thrive in wooded areas. Deer are the preferred host of adult deer ticks. Small rodents are the host of young ticks (nymphs). When a person walks in a field or wooded area, young and adult ticks in the surrounding grass and vegetation can attach themselves to the skin. They can suck blood for hours to days if unnoticed. Ticks are found all over the [place] Some ticks carry a specific bacteria (Borrelia burgdorferi) that causes an infection called Lyme disease. The bacteria is typically passed into a person during the blood sucking process. This happens after the tick has been attached for at least a number of hours. While ticks can be found all over the [place] those carrying the bacteria that causes Lyme disease are most common in [place] and the [place]. Only a small proportion of ticks in these areas carry the Lyme disease bacteria and cause human infections. Ticks usually attach to warm spots on the body, such as the:  Head.  Back.  Neck.  Armpits.  Groin. SYMPTOMS  Most of the time, a deer tick bite will not be felt. You may or may not see the attached tick. You may notice mild irritation or redness around the bite site. If the deer tick passes the Lyme disease bacteria to a person, a round, red rash may be noticed 2 to 3 days after the bite. The rash may be clear in the middle, like a bull's-eye or target. If not treated, other symptoms may develop several days to weeks after the onset of the rash. These symptoms may include:  New rash lesions.  Fatigue and weakness.  General ill feeling and achiness.  Chills.  Headache and neck pain.  Swollen lymph glands.  Sore muscles and joints. 5 to 15% of untreated people with Lyme disease may develop more severe illnesses after several weeks to months. This may include inflammation of the  brain lining (meningitis), nerve palsies, an abnormal heartbeat, or severe muscle and joint pain and inflammation (myositis or arthritis). DIAGNOSIS   Physical exam and medical history.  Viewing the tick if it was saved for confirmation.  Blood tests (to check or confirm the presence of Lyme disease). TREATMENT  Most ticks do not carry disease. If found, an attached tick should be removed using tweezers. Tweezers should be placed under the body of the tick so it is removed by its attachment parts (pincers). If there are signs or symptoms of being sick, or Lyme disease is confirmed, medicines (antibiotics) that kill germs are usually prescribed. In more severe cases, antibiotics may be given through an intravenous (IV) access. HOME CARE INSTRUCTIONS   Always remove ticks with tweezers. Do not use petroleum jelly or other methods to kill or remove the tick. Slide the tweezers under the body and pull out as much as you can. If you are not sure what it is, save it in a jar and show your caregiver.  Once you remove the tick, the skin will heal on its own. Wash your hands and the affected area with water and soap. You may place a bandage on the affected area.  Take medicine as directed. You may be advised to take a full course of antibiotics.  Follow up with your caregiver as recommended. FINDING OUT THE RESULTS OF YOUR TEST Not all test results are available   be advised to take a full course of antibiotics.   Follow up with your caregiver as recommended.  FINDING OUT THE RESULTS OF YOUR TEST  Not all test results are available during your visit. If your test results are not back during the visit, make an appointment with your caregiver to find out the results. Do not assume everything is normal if you have not heard from your caregiver or the medical facility. It is important for you to follow up on all of your test results.  PROGNOSIS   If Lyme disease is confirmed, early treatment with antibiotics is very effective. Following preventive guidelines is important since it is possible to get the disease more than once.  PREVENTION    Wear long sleeves and long pants in  wooded or grassy areas. Tuck your pants into your socks.   Use an insect repellent while hiking.   Check yourself, your children, and your pets regularly for ticks after playing outside.   Clear piles of leaves or brush from your yard. Ticks might live there.  SEEK MEDICAL CARE IF:    You or your child has an oral temperature above 102 F (38.9 C).   You develop a severe headache following the bite.   You feel generally ill.   You notice a rash.   You are having trouble removing the tick.   The bite area has red skin or yellow drainage.  SEEK IMMEDIATE MEDICAL CARE IF:    Your face is weak and droopy or you have other neurological symptoms.   You have severe joint pain or weakness.  MAKE SURE YOU:    Understand these instructions.   Will watch your condition.   Will get help right away if you are not doing well or get worse.  FOR MORE INFORMATION  Centers for Disease Control and Prevention: www.cdc.gov  American Academy [employer] Family Physicians: www.aafp.org  Document Released: 07/07/2009 Document Revised: 07/05/2011 Document Reviewed: 07/07/2009  [employer] Patient Information 2013 [employer].

## 2012-08-22 NOTE — Progress Notes (Signed)
Subjective:    Patient ID: Carrie [name], female    DOB: 10-02-1945, 67 y.o.   MRN: [id number]  HPI  Pt presents to the clinic today with c/o a tick bite on her right side. She is not completely sure when she got bit by the tick but she was outside mowing the grass on Thursday afternoon. She stayed inside all day Friday. Saturday morning, around 2:30 am she felt something on her back. She did pull the tick off of her. She did get the head out. Since she pulled the tick off, the area has been red and itchy. She denies fever, fatigue, body aches. She does want to get it checked to make sure she doesn't get a tick borne illness.  Review of Systems      Past Medical History  Diagnosis Date  . Other specified cardiac dysrhythmias   . Undiagnosed cardiac murmurs   . Osteoporosis, unspecified   . Other and unspecified hyperlipidemia   . Coronary artery disease   . Contact lens/glasses fitting     contacts or gl;asses  . Wears hearing aid     left    Current Outpatient Prescriptions  Medication Sig Dispense Refill  . aspirin 81 MG tablet Take 81 mg by mouth daily.        Marland Kitchen CALCIUM PO Take by mouth daily.      . Cholecalciferol (VITAMIN D3) 1000 UNITS CAPS Take 1 capsule by mouth daily.        . fish oil-omega-3 fatty acids 1000 MG capsule Take 1 g by mouth daily.        . mirabegron ER (MYRBETRIQ) 25 MG TB24 Take 1 tablet (25 mg total) by mouth daily.  90 tablet  3  . nitroGLYCERIN (NITROLINGUAL) 0.4 MG/SPRAY spray Place 1 spray under the tongue as directed.        Marland Kitchen oxyCODONE-acetaminophen (PERCOCET/ROXICET) 5-325 MG per tablet Take 1-2 tablets by mouth every 6 (six) hours as needed for pain.  50 tablet  0  . rosuvastatin (CRESTOR) 20 MG tablet Take 1 tablet (20 mg total) by mouth daily.      . Thiamine HCl (VITAMIN B-1) 250 MG tablet Take 500 mg by mouth daily.        Marland Kitchen doxycycline (VIBRA-TABS) 100 MG tablet Take 1 tablet (100 mg total) by mouth 2 (two) times daily.  28 tablet  0    No current facility-administered medications for this visit.    Allergies  Allergen Reactions  . Codeine   . Crestor (Rosuvastatin) Other (See Comments)    Crestor 40mg  fatigue and exhaustion--pt tolerates crestor 20mg     Family History  Problem Relation Age of Onset  . Heart disease Mother 24    CABG and valve replacement; CHF  . Hypertension Other     History   Social History  . Marital Status: Divorced    Spouse Name: N/A    Number of Children: N/A  . Years of Education: N/A   Occupational History  . Not on file.   Social History Main Topics  . Smoking status: Former Smoker    Quit date: 04/05/1999  . Smokeless tobacco: Never Used     Comment: Regular Exercise-No  . Alcohol Use: 1.8 oz/week    3 Glasses of wine per week  . Drug Use: No  . Sexually Active: Not Currently   Other Topics Concern  . Not on file   Social History Narrative  . No narrative on file  Constitutional: Denies fever, malaise, fatigue, headache or abrupt weight changes. . Musculoskeletal: Denies decrease in range of motion, difficulty with gait, muscle pain or joint pain and swelling.  Skin: Pt reports tick bite on right upper back. Denies rashes, lesions or ulcercations.    No other specific complaints in a complete review of systems (except as listed in HPI above).   Objective:   Physical Exam   BP 130/82  Pulse 78  Temp(Src) 98 F (36.7 C) (Oral)  SpO2 98% Wt Readings from Last 3 Encounters:  07/19/12 133 lb 12 oz (60.669 kg)  06/22/12 134 lb (60.782 kg)  04/07/12 134 lb 6 oz (60.952 kg)    General: Appears her stated age, well developed, well nourished in NAD. Skin: Warm, dry and intact. Small scab surrounded by area of erythema on right upper back. No evidence of target lesion. Cardiovascular: Normal rate and rhythm. S1,S2 noted.  No murmur, rubs or gallops noted. No JVD or BLE edema. No carotid bruits noted. Pulmonary/Chest: Normal effort and positive vesicular  breath sounds. No respiratory distress. No wheezes, rales or ronchi noted.  Musculoskeletal: Normal range of motion. No signs of joint swelling. No difficulty with gait.        Assessment & Plan:   Tick bite, possible greater than 24 hours, new onset:  Will prophylactically treat with Doxy x 14 days to prevent tick born illness Monitor your symptoms, if worse, return to the office for follow up Apply anti itch cream to the affected area BID

## 2012-11-14 ENCOUNTER — Encounter: Payer: Self-pay | Admitting: Cardiology

## 2012-11-14 ENCOUNTER — Ambulatory Visit (INDEPENDENT_AMBULATORY_CARE_PROVIDER_SITE_OTHER): Payer: Managed Care, Other (non HMO) | Admitting: Cardiology

## 2012-11-14 VITALS — BP 136/76 | HR 74 | Ht 61.0 in | Wt 137.0 lb

## 2012-11-14 DIAGNOSIS — I350 Nonrheumatic aortic (valve) stenosis: Secondary | ICD-10-CM

## 2012-11-14 DIAGNOSIS — E785 Hyperlipidemia, unspecified: Secondary | ICD-10-CM

## 2012-11-14 DIAGNOSIS — I359 Nonrheumatic aortic valve disorder, unspecified: Secondary | ICD-10-CM

## 2012-11-14 NOTE — Patient Instructions (Addendum)
Take coenzyme Q10 200mg  daily.   Try to take crestor every day.  Your physician recommends that you return for a FASTING lipid profile: in 1 month. This is scheduled for Tuesday August 26,2014 at the [facility].   Your physician wants you to follow-up in: 1 year with Dr Shirlee Latch. (July 2015). You will receive a reminder letter in the mail two months in advance. If you don't receive a letter, please call our office to schedule the follow-up appointment.   Your physician has requested that you have an echocardiogram. Echocardiography is a painless test that uses sound waves to create images of your heart. It provides your doctor with information about the size and shape of your heart and how well your heart's chambers and valves are working. This procedure takes approximately one hour. There are no restrictions for this procedure. July 2015 before you see Dr Shirlee Latch.

## 2012-11-16 NOTE — Progress Notes (Signed)
Patient ID: Carrie [name], female   DOB: April 24, 1946, 67 y.o.   MRN: [id number] PCP: Dr. Yetta Barre  67 yo with history of nonobstructive CAD, SVT s/p ablation, and mild aortic stenosis presents for cardiology followup.  She has been doing well symptomatically.  No episodes of tachypalpitations.  No chest pain or dyspnea.  No lightheadedness or syncope.  She is taking Crestor but gets myalgias.   ECG: NSR, normal  Labs (5/12): LDL 151, HDL 74, K 4.8, creatinine 0.7, TSH normal Labs (5/13): LDL 112, HDL 86, K 4.3, creatinine 0.5 Labs (2/14): TSH normal, LDL 121, HDL 68  PMH:  1. CAD: LHC 4098 with 50-60% mid LAD stenosis.  ETT-myoview (6/10): Exercised 10 minutes, ST depression with exercise, no ischemia or infarction on perfusion images.   2. SVT ablation 2005 3. Aortic stenosis: Mild.  Echo (3/11) with EF 55-60%, trileaflet aortic valve with mild AS with mean gradient 15 mmHg, mild MR.  Echo (6/13): EF 60-65%, mild AS mean gradient 14 mmHg, mild MR.  4. Hyperlipidemia 5. Osteoporosis.  FH: HTN.  Father with CABG + AVR at age 26.  Mother with CHF.   SH: Divorced.  Nonsmoker.  Lives in [facility].  Works part time at [employer] and full time at [employer] [place].    Current Outpatient Prescriptions  Medication Sig Dispense Refill  . aspirin 81 MG tablet Take 81 mg by mouth daily.        Marland Kitchen CALCIUM PO Take by mouth daily.      . Cholecalciferol (VITAMIN D3) 1000 UNITS CAPS Take 1 capsule by mouth daily.        Marland Kitchen doxycycline (VIBRA-TABS) 100 MG tablet Take 1 tablet (100 mg total) by mouth 2 (two) times daily.  28 tablet  0  . fish oil-omega-3 fatty acids 1000 MG capsule Take 1 g by mouth daily.        . nitroGLYCERIN (NITROLINGUAL) 0.4 MG/SPRAY spray Place 1 spray under the tongue as directed.        . rosuvastatin (CRESTOR) 20 MG tablet Take 1 tablet (20 mg total) by mouth daily.      . Thiamine HCl (VITAMIN B-1) 250 MG tablet Take 500 mg by mouth daily.        . Coenzyme Q10 200 MG TABS Take 1  tablet (200 mg total) by mouth daily.    0   No current facility-administered medications for this visit.    BP 136/76  Pulse 74  Ht 5\' 1"  (1.549 m)  Wt 62.143 kg (137 lb)  BMI 25.9 kg/m2 General: NAD Neck: No JVD, no thyromegaly or thyroid nodule.  Lungs: Clear to auscultation bilaterally with normal respiratory effort. CV: Nondisplaced PMI.  Heart regular S1/S2, no S3/S4, 2/6 systolic crescendo-decrescendo murmur RUSB, S2 heard clearly.  No peripheral edema.  No carotid bruit.  Normal pedal pulses.  Abdomen: Soft, nontender, no hepatosplenomegaly, no distention.  Neurologic: Alert and oriented x 3.  Psych: Normal affect. Extremities: No clubbing or cyanosis.   Assessment/Plan: 1. Aortic stenosis: Mild on prior echo.  No concerning symptoms.  I will have her return for followup in 1 year with a repeat echo.  2. Hyperlipidemia: Some myalgias with Crestor but she is taking it.  She will try coenzyme Q10 200 mg daily. Repeat lipids in 8/14.   Marca Ancona 11/16/2012

## 2012-12-14 ENCOUNTER — Other Ambulatory Visit: Payer: Self-pay | Admitting: *Deleted

## 2012-12-14 MED ORDER — ROSUVASTATIN CALCIUM 20 MG PO TABS: 20.0000 mg | ORAL_TABLET | Freq: Every day | ORAL | Status: DC

## 2012-12-19 ENCOUNTER — Other Ambulatory Visit: Payer: Managed Care, Other (non HMO)

## 2012-12-19 ENCOUNTER — Other Ambulatory Visit (INDEPENDENT_AMBULATORY_CARE_PROVIDER_SITE_OTHER): Payer: Managed Care, Other (non HMO)

## 2012-12-19 DIAGNOSIS — I359 Nonrheumatic aortic valve disorder, unspecified: Secondary | ICD-10-CM

## 2012-12-19 LAB — LIPID PANEL
Cholesterol: 228 mg/dL — ABNORMAL HIGH (ref 0–200)
Triglycerides: 126 mg/dL (ref 0.0–149.0)

## 2012-12-19 LAB — LDL CHOLESTEROL, DIRECT: Direct LDL: 133.8 mg/dL

## 2012-12-26 ENCOUNTER — Telehealth: Payer: Self-pay | Admitting: Cardiovascular Disease

## 2012-12-26 NOTE — Telephone Encounter (Signed)
F/up  Pt returned call about lab results//

## 2012-12-26 NOTE — Telephone Encounter (Signed)
Voice Mail 

## 2012-12-26 NOTE — Telephone Encounter (Signed)
LMTCB

## 2012-12-27 NOTE — Telephone Encounter (Signed)
Follow up  ° ° ° ° °Pt is returning your call  °

## 2012-12-27 NOTE — Telephone Encounter (Signed)
Spoke with patient about recent lab 

## 2012-12-28 ENCOUNTER — Encounter: Payer: Self-pay | Admitting: Internal Medicine

## 2012-12-28 ENCOUNTER — Ambulatory Visit (INDEPENDENT_AMBULATORY_CARE_PROVIDER_SITE_OTHER): Payer: Managed Care, Other (non HMO) | Admitting: Internal Medicine

## 2012-12-28 VITALS — BP 134/82 | HR 80 | Temp 97.3°F | Resp 16 | Wt 136.8 lb

## 2012-12-28 DIAGNOSIS — J309 Allergic rhinitis, unspecified: Secondary | ICD-10-CM

## 2012-12-28 DIAGNOSIS — I350 Nonrheumatic aortic (valve) stenosis: Secondary | ICD-10-CM

## 2012-12-28 DIAGNOSIS — I359 Nonrheumatic aortic valve disorder, unspecified: Secondary | ICD-10-CM

## 2012-12-28 MED ORDER — MOMETASONE FUROATE 50 MCG/ACT NA SUSP: 4.0000 | Freq: Every day | NASAL | Status: DC

## 2012-12-28 MED ORDER — CETIRIZINE HCL 10 MG PO TABS: 10.0000 mg | ORAL_TABLET | Freq: Every day | ORAL | Status: DC

## 2012-12-28 NOTE — Patient Instructions (Addendum)
Allergic Rhinitis Allergic rhinitis is when the mucous membranes in the nose respond to allergens. Allergens are particles in the air that cause your body to have an allergic reaction. This causes you to release allergic antibodies. Through a chain of events, these eventually cause you to release histamine into the blood stream (hence the use of antihistamines). Although meant to be protective to the body, it is this release that causes your discomfort, such as frequent sneezing, congestion and an itchy runny nose.  CAUSES  The pollen allergens may come from grasses, trees, and weeds. This is seasonal allergic rhinitis, or "hay fever." Other allergens cause year-round allergic rhinitis (perennial allergic rhinitis) such as house dust mite allergen, pet dander and mold spores.  SYMPTOMS   Nasal stuffiness (congestion).  Runny, itchy nose with sneezing and tearing of the eyes.  There is often an itching of the mouth, eyes and ears. It cannot be cured, but it can be controlled with medications. DIAGNOSIS  If you are unable to determine the offending allergen, skin or blood testing may find it. TREATMENT   Avoid the allergen.  Medications and allergy shots (immunotherapy) can help.  Hay fever may often be treated with antihistamines in pill or nasal spray forms. Antihistamines block the effects of histamine. There are over-the-counter medicines that may help with nasal congestion and swelling around the eyes. Check with your caregiver before taking or giving this medicine. If the treatment above does not work, there are many new medications your caregiver can prescribe. Stronger medications may be used if initial measures are ineffective. Desensitizing injections can be used if medications and avoidance fails. Desensitization is when a patient is given ongoing shots until the body becomes less sensitive to the allergen. Make sure you follow up with your caregiver if problems continue. SEEK MEDICAL  CARE IF:   You develop fever (more than 100.5 F (38.1 C).  You develop a cough that does not stop easily (persistent).  You have shortness of breath.  You start wheezing.  Symptoms interfere with normal daily activities. Document Released: 01/05/2001 Document Revised: 07/05/2011 Document Reviewed: 07/17/2008 Exit[employer] Patient Information 2014 [employer].  

## 2012-12-29 ENCOUNTER — Encounter: Payer: Self-pay | Admitting: Internal Medicine

## 2012-12-29 NOTE — Progress Notes (Signed)
Subjective:    Patient ID: Carrie [name], female    DOB: 1945/10/04, 67 y.o.   MRN: [id number]  HPI  She returns c/o nasal congestion, sneezing, runny nose for several weeks.  Review of Systems  Constitutional: Negative.  Negative for fever, chills, diaphoresis, activity change, appetite change, fatigue and unexpected weight change.  HENT: Positive for congestion, rhinorrhea, sneezing and postnasal drip. Negative for nosebleeds, sore throat, facial swelling, trouble swallowing, neck pain, neck stiffness, dental problem, sinus pressure and tinnitus.   Eyes: Negative.   Respiratory: Negative for apnea, cough, choking, chest tightness, shortness of breath, wheezing and stridor.   Cardiovascular: Negative.  Negative for chest pain, palpitations and leg swelling.  Gastrointestinal: Negative.  Negative for abdominal pain.  Endocrine: Negative.   Genitourinary: Negative.   Musculoskeletal: Negative.   Skin: Negative.   Allergic/Immunologic: Negative.   Neurological: Negative.  Negative for dizziness, tremors, weakness and light-headedness.  Hematological: Negative.  Negative for adenopathy. Does not bruise/bleed easily.  Psychiatric/Behavioral: Negative.        Objective:   Physical Exam  Vitals reviewed. Constitutional: She is oriented to person, place, and time. She appears well-developed and well-nourished.  Non-toxic appearance. She does not have a sickly appearance. She does not appear ill. No distress.  HENT:  Head: Normocephalic and atraumatic.  Right Ear: Hearing, tympanic membrane, external ear and ear canal normal.  Left Ear: Hearing, tympanic membrane, external ear and ear canal normal.  Nose: Mucosal edema and rhinorrhea present. No nose lacerations, sinus tenderness, nasal deformity, septal deviation or nasal septal hematoma. No epistaxis.  No foreign bodies. Right sinus exhibits no maxillary sinus tenderness and no frontal sinus tenderness. Left sinus exhibits no maxillary  sinus tenderness and no frontal sinus tenderness.  Mouth/Throat: Oropharynx is clear and moist and mucous membranes are normal. Mucous membranes are not pale, not dry and not cyanotic. No oral lesions. No trismus in the jaw. No edematous. No oropharyngeal exudate, posterior oropharyngeal edema, posterior oropharyngeal erythema or tonsillar abscesses.  Eyes: Conjunctivae are normal. Right eye exhibits no discharge. Left eye exhibits no discharge. No scleral icterus.  Neck: Normal range of motion. Neck supple. No JVD present. No tracheal deviation present. No thyromegaly present.  Cardiovascular: Normal rate, regular rhythm, S1 normal, S2 normal and intact distal pulses.  Exam reveals no gallop, no S3, no S4 and no friction rub.   Murmur heard.  Decrescendo systolic murmur is present with a grade of 3/6   No diastolic murmur is present  Pulses:      Carotid pulses are 1+ on the right side, and 1+ on the left side.      Radial pulses are 1+ on the right side, and 1+ on the left side.       Femoral pulses are 1+ on the right side, and 1+ on the left side.      Popliteal pulses are 1+ on the right side, and 1+ on the left side.       Dorsalis pedis pulses are 1+ on the right side, and 1+ on the left side.       Posterior tibial pulses are 1+ on the right side, and 1+ on the left side.  Pulmonary/Chest: Effort normal and breath sounds normal. No stridor. No respiratory distress. She has no wheezes. She has no rales. She exhibits no tenderness.  Abdominal: Soft. Bowel sounds are normal. She exhibits no distension and no mass. There is no tenderness. There is no rebound and no guarding.  Musculoskeletal: Normal range of motion. She exhibits no edema and no tenderness.  Lymphadenopathy:    She has no cervical adenopathy.  Neurological: She is oriented to person, place, and time.  Skin: Skin is warm and dry. No rash noted. She is not diaphoretic. No erythema. No pallor.          Assessment & Plan:

## 2012-12-29 NOTE — Assessment & Plan Note (Signed)
She does not have any symptoms related to this but her last ECHO was 2 years ago - at that time the gradient was not very high but a valve area was not reported so I have asked her to get an updated ECHO done

## 2012-12-29 NOTE — Assessment & Plan Note (Signed)
Will treat with zyrtec and nasonex

## 2013-01-11 ENCOUNTER — Ambulatory Visit (HOSPITAL_COMMUNITY): Payer: Managed Care, Other (non HMO) | Attending: Internal Medicine | Admitting: Radiology

## 2013-01-11 DIAGNOSIS — I359 Nonrheumatic aortic valve disorder, unspecified: Secondary | ICD-10-CM | POA: Diagnosis not present

## 2013-01-11 DIAGNOSIS — E785 Hyperlipidemia, unspecified: Secondary | ICD-10-CM | POA: Diagnosis not present

## 2013-01-11 DIAGNOSIS — Z87891 Personal history of nicotine dependence: Secondary | ICD-10-CM | POA: Diagnosis not present

## 2013-01-11 DIAGNOSIS — I498 Other specified cardiac arrhythmias: Secondary | ICD-10-CM | POA: Insufficient documentation

## 2013-01-11 DIAGNOSIS — I2510 Atherosclerotic heart disease of native coronary artery without angina pectoris: Secondary | ICD-10-CM | POA: Diagnosis not present

## 2013-01-11 DIAGNOSIS — I350 Nonrheumatic aortic (valve) stenosis: Secondary | ICD-10-CM

## 2013-01-11 NOTE — Progress Notes (Signed)
Echocardiogram performed.  

## 2013-01-17 ENCOUNTER — Ambulatory Visit (INDEPENDENT_AMBULATORY_CARE_PROVIDER_SITE_OTHER): Payer: Managed Care, Other (non HMO)

## 2013-01-17 DIAGNOSIS — M810 Age-related osteoporosis without current pathological fracture: Secondary | ICD-10-CM | POA: Diagnosis not present

## 2013-01-17 MED ORDER — DENOSUMAB 60 MG/ML ~~LOC~~ SOLN
60.0000 mg | Freq: Once | SUBCUTANEOUS | Status: AC
  Administered 2013-01-17: 60 mg via SUBCUTANEOUS

## 2013-03-07 ENCOUNTER — Encounter: Payer: Self-pay | Admitting: Internal Medicine

## 2013-03-29 ENCOUNTER — Encounter: Payer: Self-pay | Admitting: Podiatry

## 2013-03-29 ENCOUNTER — Ambulatory Visit: Payer: Self-pay | Admitting: Podiatry

## 2013-03-29 ENCOUNTER — Ambulatory Visit (INDEPENDENT_AMBULATORY_CARE_PROVIDER_SITE_OTHER): Payer: Managed Care, Other (non HMO)

## 2013-03-29 ENCOUNTER — Ambulatory Visit (INDEPENDENT_AMBULATORY_CARE_PROVIDER_SITE_OTHER): Payer: Managed Care, Other (non HMO) | Admitting: Podiatry

## 2013-03-29 VITALS — BP 156/89 | HR 80 | Resp 16

## 2013-03-29 DIAGNOSIS — M79675 Pain in left toe(s): Secondary | ICD-10-CM

## 2013-03-29 DIAGNOSIS — M779 Enthesopathy, unspecified: Secondary | ICD-10-CM

## 2013-03-29 DIAGNOSIS — M79609 Pain in unspecified limb: Secondary | ICD-10-CM | POA: Diagnosis not present

## 2013-03-29 DIAGNOSIS — L84 Corns and callosities: Secondary | ICD-10-CM

## 2013-03-29 MED ORDER — TRIAMCINOLONE ACETONIDE 10 MG/ML IJ SUSP
10.0000 mg | Freq: Once | INTRAMUSCULAR | Status: AC
  Administered 2013-03-29: 10 mg

## 2013-03-29 NOTE — Progress Notes (Signed)
Subjective:     Patient ID: Carrie [name], female   DOB: 1945/06/06, 67 y.o.   MRN: [id number]  HPI patient presents stating I'm having pain between the fourth and fifth toes on my left foot with a corn callus formation and I'm also concerned about the position of my second toe  Review of Systems     Objective:   Physical Exam Neurovascular status is intact with health history unchanged and patient is found to have 8 inflamed keratotic lesion fourth interspace left with fluid buildup around the   fourth MPJ area the second toe is quite elevated secondary to injury causing probable flexor plate dislocation or tear  Assessment:     Inflamed fourth intermetatarsal space with capsulitis and keratotic lesion and are digit and fourth digit left along with hammertoe deformity second left    Plan:     Reviewed all conditions and today did careful capsular injection fourth MPJ left 3 mg Kenalog 5 mg Xylocaine and then went ahead and debrided lesion and discussed correction of second toe which I do not recommend and less it were to get worse

## 2013-04-02 DIAGNOSIS — L820 Inflamed seborrheic keratosis: Secondary | ICD-10-CM | POA: Diagnosis not present

## 2013-04-02 DIAGNOSIS — L821 Other seborrheic keratosis: Secondary | ICD-10-CM | POA: Diagnosis not present

## 2013-04-05 DIAGNOSIS — N950 Postmenopausal bleeding: Secondary | ICD-10-CM | POA: Diagnosis not present

## 2013-05-01 ENCOUNTER — Ambulatory Visit: Payer: Managed Care, Other (non HMO) | Admitting: Gynecology

## 2013-05-03 DIAGNOSIS — Q667 Congenital pes cavus, unspecified foot: Secondary | ICD-10-CM | POA: Diagnosis not present

## 2013-05-03 DIAGNOSIS — M2010 Hallux valgus (acquired), unspecified foot: Secondary | ICD-10-CM | POA: Diagnosis not present

## 2013-05-03 DIAGNOSIS — M25579 Pain in unspecified ankle and joints of unspecified foot: Secondary | ICD-10-CM | POA: Diagnosis not present

## 2013-05-04 DIAGNOSIS — N950 Postmenopausal bleeding: Secondary | ICD-10-CM | POA: Diagnosis not present

## 2013-05-25 DIAGNOSIS — S93336A Other dislocation of unspecified foot, initial encounter: Secondary | ICD-10-CM | POA: Diagnosis not present

## 2013-05-25 DIAGNOSIS — M24476 Recurrent dislocation, unspecified foot: Secondary | ICD-10-CM | POA: Diagnosis not present

## 2013-05-25 DIAGNOSIS — M24473 Recurrent dislocation, unspecified ankle: Secondary | ICD-10-CM | POA: Diagnosis not present

## 2013-05-25 DIAGNOSIS — M2010 Hallux valgus (acquired), unspecified foot: Secondary | ICD-10-CM | POA: Diagnosis not present

## 2013-05-25 DIAGNOSIS — L919 Hypertrophic disorder of the skin, unspecified: Secondary | ICD-10-CM | POA: Diagnosis not present

## 2013-05-25 DIAGNOSIS — M6240 Contracture of muscle, unspecified site: Secondary | ICD-10-CM | POA: Diagnosis not present

## 2013-05-25 DIAGNOSIS — M898X9 Other specified disorders of bone, unspecified site: Secondary | ICD-10-CM | POA: Diagnosis not present

## 2013-05-25 DIAGNOSIS — L909 Atrophic disorder of skin, unspecified: Secondary | ICD-10-CM | POA: Diagnosis not present

## 2013-05-25 DIAGNOSIS — M2040 Other hammer toe(s) (acquired), unspecified foot: Secondary | ICD-10-CM | POA: Diagnosis not present

## 2013-06-07 DIAGNOSIS — M898X9 Other specified disorders of bone, unspecified site: Secondary | ICD-10-CM | POA: Diagnosis not present

## 2013-07-10 DIAGNOSIS — M898X9 Other specified disorders of bone, unspecified site: Secondary | ICD-10-CM | POA: Diagnosis not present

## 2013-09-20 DIAGNOSIS — M25579 Pain in unspecified ankle and joints of unspecified foot: Secondary | ICD-10-CM | POA: Diagnosis not present

## 2013-10-05 ENCOUNTER — Ambulatory Visit (INDEPENDENT_AMBULATORY_CARE_PROVIDER_SITE_OTHER)
Admission: RE | Admit: 2013-10-05 | Discharge: 2013-10-05 | Disposition: A | Discharge status: Home / Self Care | Payer: Managed Care, Other (non HMO) | Source: Ambulatory Visit | Attending: Internal Medicine | Admitting: Internal Medicine

## 2013-10-05 ENCOUNTER — Encounter: Payer: Self-pay | Admitting: Internal Medicine

## 2013-10-05 ENCOUNTER — Ambulatory Visit (INDEPENDENT_AMBULATORY_CARE_PROVIDER_SITE_OTHER): Payer: Managed Care, Other (non HMO) | Admitting: Internal Medicine

## 2013-10-05 VITALS — BP 132/74 | HR 104 | Temp 98.1°F | Ht 61.0 in | Wt 135.8 lb

## 2013-10-05 DIAGNOSIS — I83893 Varicose veins of bilateral lower extremities with other complications: Secondary | ICD-10-CM | POA: Diagnosis not present

## 2013-10-05 DIAGNOSIS — M810 Age-related osteoporosis without current pathological fracture: Secondary | ICD-10-CM

## 2013-10-05 DIAGNOSIS — R05 Cough: Secondary | ICD-10-CM | POA: Diagnosis not present

## 2013-10-05 DIAGNOSIS — R059 Cough, unspecified: Secondary | ICD-10-CM

## 2013-10-05 MED ORDER — FLUTICASONE FUROATE-VILANTEROL 100-25 MCG/INH IN AEPB: 1.0000 | INHALATION_SPRAY | Freq: Every day | RESPIRATORY_TRACT | Status: DC

## 2013-10-05 NOTE — Patient Instructions (Signed)

## 2013-10-05 NOTE — Progress Notes (Signed)
Pre visit review using our clinic review tool, if applicable. No additional management support is needed unless otherwise documented below in the visit note. 

## 2013-10-06 ENCOUNTER — Encounter: Payer: Self-pay | Admitting: Internal Medicine

## 2013-10-06 NOTE — Assessment & Plan Note (Signed)
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

## 2013-10-06 NOTE — Progress Notes (Signed)
   Subjective:    Patient ID: Carrie [name], female    DOB: [birth year], 68 y.o.   MRN: [id number]  Cough This is a new problem. The current episode started 1 to 4 weeks ago. The problem has been unchanged. The cough is non-productive. Associated symptoms include postnasal drip. Pertinent negatives include no chest pain, chills, ear congestion, ear pain, fever, headaches, heartburn, hemoptysis, myalgias, nasal congestion, rash, rhinorrhea, sore throat, shortness of breath, sweats, weight loss or wheezing. She has tried OTC cough suppressant for the symptoms. The treatment provided moderate relief. Her past medical history is significant for environmental allergies. There is no history of asthma, bronchiectasis, bronchitis, COPD, emphysema or pneumonia.      Review of Systems  Constitutional: Negative.  Negative for fever, chills, weight loss, diaphoresis and fatigue.  HENT: Positive for postnasal drip and sneezing. Negative for congestion, ear pain, nosebleeds, rhinorrhea, sinus pressure, sore throat, trouble swallowing and voice change.   Eyes: Negative.   Respiratory: Positive for cough. Negative for apnea, hemoptysis, choking, chest tightness, shortness of breath, wheezing and stridor.   Cardiovascular: Negative.  Negative for chest pain, palpitations and leg swelling.  Gastrointestinal: Negative.  Negative for heartburn, nausea, abdominal pain, diarrhea and constipation.  Endocrine: Negative.   Genitourinary: Negative.   Musculoskeletal: Negative.  Negative for myalgias.  Skin: Negative.  Negative for rash.  Allergic/Immunologic: Positive for environmental allergies.  Neurological: Negative.  Negative for headaches.  Hematological: Negative.  Negative for adenopathy. Does not bruise/bleed easily.  Psychiatric/Behavioral: Negative.        Objective:   Physical Exam  Vitals reviewed. Constitutional: She is oriented to person, place, and time. She appears well-developed and  well-nourished.  Non-toxic appearance. She does not have a sickly appearance. She does not appear ill. No distress.  HENT:  Head: Normocephalic and atraumatic.  Mouth/Throat: Oropharynx is clear and moist. No oropharyngeal exudate.  Eyes: Conjunctivae are normal. Right eye exhibits no discharge. Left eye exhibits no discharge. No scleral icterus.  Neck: Normal range of motion. Neck supple. No JVD present. No tracheal deviation present. No thyromegaly present.  Cardiovascular: Normal rate, regular rhythm, normal heart sounds and intact distal pulses.  Exam reveals no gallop and no friction rub.   No murmur heard. Pulmonary/Chest: Effort normal and breath sounds normal. No accessory muscle usage or stridor. Not tachypneic. No respiratory distress. She has no decreased breath sounds. She has no wheezes. She has no rhonchi. She has no rales. She exhibits no tenderness.  Abdominal: Soft. Bowel sounds are normal. She exhibits no distension and no mass. There is no tenderness. There is no rebound and no guarding.  Musculoskeletal: Normal range of motion. She exhibits no edema and no tenderness.  Lymphadenopathy:    She has no cervical adenopathy.  Neurological: She is oriented to person, place, and time.  Skin: Skin is warm and dry. No rash noted. She is not diaphoretic. No erythema. No pallor.  Psychiatric: She has a normal mood and affect. Her behavior is normal. Judgment and thought content normal.          Assessment & Plan:

## 2013-10-06 NOTE — Assessment & Plan Note (Signed)
She has a new fracture in T9

## 2013-10-12 ENCOUNTER — Telehealth: Payer: Self-pay

## 2013-10-12 NOTE — Telephone Encounter (Signed)
Phone call from patient states she needs to start the process of getting her Prolia injection.

## 2013-10-15 NOTE — Telephone Encounter (Signed)
Sent pt's info for insurance verification for her Prolia injection. I will let you know as soon as I have a response. Thank you.

## 2013-10-16 ENCOUNTER — Other Ambulatory Visit: Payer: Self-pay | Admitting: *Deleted

## 2013-10-16 DIAGNOSIS — I83893 Varicose veins of bilateral lower extremities with other complications: Secondary | ICD-10-CM

## 2013-10-16 NOTE — Telephone Encounter (Signed)
Pt left  msg on triage requesting the status on the prolia. Called pt back LMOM with information below....Carrie [name]

## 2013-10-23 DIAGNOSIS — M546 Pain in thoracic spine: Secondary | ICD-10-CM | POA: Diagnosis not present

## 2013-10-25 NOTE — Telephone Encounter (Signed)
Patient states that she called [employer] and was informed that they had pre-approved her for 1 year beginning September 2014. Told her that we would need to verify this information before ordering Prolia. I did not see pre-approval scanned in her chart from last year.

## 2013-10-31 NOTE — Telephone Encounter (Signed)
The verification we rec'd back states that a prior authorization is needed and when verifying coverage there was no mention of pre-approval for a year. With Prolia we verify every six months b/c plans can change w/in that time period. I spoke to [place] at [facility] on 10/24/2013 and she is supposed to be faxing me the prior authorization form, which I will get to you for Dr. Ronnald Ramp to sign.  Once that is complete you can fax the completed form along w/clinicals to me at [phone].  I will call [employer] to check on the status of that form since I have not received it yet. If you have any questions, please let me know. Thank you.

## 2013-11-05 NOTE — Telephone Encounter (Signed)
I have the prior authorization form [employer] requires for Dr. Ronnald Ramp to complete and sign. If you can send me the fax #, I will get it to you. Thank you.

## 2013-11-06 NOTE — Telephone Encounter (Signed)
Fax# [phone]

## 2013-11-07 NOTE — Telephone Encounter (Signed)
Faxed prior authorization to your attention. Please return completed prior authorization form and clinicals to me at [phone]. Thank you.

## 2013-11-13 NOTE — Telephone Encounter (Signed)
I faxed an [employer] prior authorization form to you on 11/07/2013 for pt's Prolia injection. I just realized the address for Dr. Ronnald Ramp is incorrect on this form.  I will get an updated form and fax to you for Dr. Ronnald Ramp to complete/sign, so if he hasn't already completed the one I faxed on 11/07/2013, then please disregard that one. Thank you.

## 2013-11-20 ENCOUNTER — Telehealth: Payer: Self-pay | Admitting: *Deleted

## 2013-11-20 NOTE — Telephone Encounter (Signed)
Left msg on triage checking status on the PA for her prolia...Carrie [name]

## 2013-11-20 NOTE — Telephone Encounter (Signed)
No rejection received, Prolia is now handle by Rose. Thanks

## 2013-11-30 ENCOUNTER — Telehealth: Payer: Self-pay | Admitting: Internal Medicine

## 2013-11-30 NOTE — Telephone Encounter (Signed)
Need approval for prolia from insurance.  Thanks!

## 2013-12-04 NOTE — Telephone Encounter (Signed)
[employer] has given an authorization #[id number] which is valid till 01/11/2014. If pt has an office visit then her estimated responsibility will be a $15 co-pay; if there is no office visit then pt's estimated responsibility will be $0. I have sent a copy of pt's summary of benefits and the [employer] authorization to be scanned into her chart. If you have any questions, please let me know. Thank you.

## 2013-12-11 ENCOUNTER — Ambulatory Visit (INDEPENDENT_AMBULATORY_CARE_PROVIDER_SITE_OTHER): Payer: Managed Care, Other (non HMO)

## 2013-12-11 DIAGNOSIS — M810 Age-related osteoporosis without current pathological fracture: Secondary | ICD-10-CM

## 2013-12-11 MED ORDER — DENOSUMAB 60 MG/ML ~~LOC~~ SOLN
60.0000 mg | Freq: Once | SUBCUTANEOUS | Status: AC
  Administered 2013-12-11: 60 mg via SUBCUTANEOUS

## 2013-12-13 DIAGNOSIS — L570 Actinic keratosis: Secondary | ICD-10-CM | POA: Diagnosis not present

## 2013-12-13 DIAGNOSIS — L820 Inflamed seborrheic keratosis: Secondary | ICD-10-CM | POA: Diagnosis not present

## 2013-12-13 DIAGNOSIS — L723 Sebaceous cyst: Secondary | ICD-10-CM | POA: Diagnosis not present

## 2013-12-13 DIAGNOSIS — L821 Other seborrheic keratosis: Secondary | ICD-10-CM | POA: Diagnosis not present

## 2013-12-18 ENCOUNTER — Ambulatory Visit (INDEPENDENT_AMBULATORY_CARE_PROVIDER_SITE_OTHER): Payer: Managed Care, Other (non HMO) | Admitting: Internal Medicine

## 2013-12-18 ENCOUNTER — Encounter: Payer: Self-pay | Admitting: Internal Medicine

## 2013-12-18 VITALS — BP 130/80 | HR 72 | Temp 98.3°F | Wt 138.1 lb

## 2013-12-18 DIAGNOSIS — R350 Frequency of micturition: Secondary | ICD-10-CM | POA: Diagnosis not present

## 2013-12-18 DIAGNOSIS — R351 Nocturia: Secondary | ICD-10-CM | POA: Diagnosis not present

## 2013-12-18 DIAGNOSIS — R3911 Hesitancy of micturition: Secondary | ICD-10-CM | POA: Diagnosis not present

## 2013-12-18 LAB — POCT URINALYSIS DIPSTICK
Bilirubin, UA: NEGATIVE
Blood, UA: NEGATIVE
Glucose, UA: NEGATIVE
Ketones, UA: NEGATIVE
Leukocytes, UA: NEGATIVE
NITRITE UA: NEGATIVE
PH UA: 6
PROTEIN UA: NEGATIVE
Spec Grav, UA: >=1.03
Urobilinogen, UA: 0.2

## 2013-12-18 MED ORDER — MIRABEGRON ER 25 MG PO TB24: 25.0000 mg | ORAL_TABLET | Freq: Every day | ORAL | Status: DC

## 2013-12-18 NOTE — Progress Notes (Signed)
Pre visit review using our clinic review tool, if applicable. No additional management support is needed unless otherwise documented below in the visit note. 

## 2013-12-18 NOTE — Progress Notes (Signed)
   Subjective:    Patient ID: Carrie [name], female    DOB: 17-May-1945, 68 y.o.   MRN: [id number]  HPI   She describes symptoms for at least 6 months as hesitancy and frequency. She also has nocturia at least twice at night between 11 PM and 4 am. On road trips she jokingly states she must know the location of the restrooms.  She has no past history of genitourinary anomalies, cystoscopy, or recurrent urinary tract infections No antibiotics in past 30 days. She has no history of diabetes.    Review of Systems  She has no urgency, dysuria, hematuria, pyuria, flank pain, fever, chills, or sweats.     Objective:   Physical Exam  Positive pertinent physical findings include decreased height of the thorax & accentuation of the upper  thoracic curvature. She has  a grade 2 systolic murmur. There is carotid radiation of murmur bilaterally.  General appearance :adequately nourished; in no distress. Eyes: No conjunctival inflammation or scleral icterus is present. Oral exam: Dental hygiene is good. Lips and gums are healthy appearing.There is no oropharyngeal erythema or exudate noted.  Heart:  Normal rate and regular rhythm. S1 and S2 normal without gallop, murmur, click, rub or other extra sounds   Lungs:Chest clear to auscultation; no wheezes, rhonchi,rales ,or rubs present.No increased work of breathing.  Abdomen: bowel sounds normal, soft and non-tender without masses, organomegaly or hernias noted.  No guarding or rebound. No flank tenderness to percussion. Skin:Warm & dry.  Intact without suspicious lesions or rashes ; no jaundice or tenting Lymphatic: No lymphadenopathy is noted about the head, neck, axilla             Assessment & Plan:  #1 frequency of urination; probable OAB  #2 hesitancy  #3 nocturia  Plan: See orders and recommendations

## 2013-12-18 NOTE — Patient Instructions (Signed)
Please take the Myrbetriq sample once daily as a trial for your urinary tract symptoms. 

## 2013-12-27 ENCOUNTER — Encounter: Payer: Self-pay | Admitting: *Deleted

## 2013-12-28 ENCOUNTER — Encounter: Payer: Self-pay | Admitting: Cardiology

## 2013-12-28 ENCOUNTER — Ambulatory Visit (INDEPENDENT_AMBULATORY_CARE_PROVIDER_SITE_OTHER): Payer: Managed Care, Other (non HMO) | Admitting: Cardiology

## 2013-12-28 VITALS — BP 122/72 | HR 75 | Ht 61.0 in | Wt 136.0 lb

## 2013-12-28 DIAGNOSIS — I359 Nonrheumatic aortic valve disorder, unspecified: Secondary | ICD-10-CM

## 2013-12-28 DIAGNOSIS — E785 Hyperlipidemia, unspecified: Secondary | ICD-10-CM

## 2013-12-28 DIAGNOSIS — I350 Nonrheumatic aortic (valve) stenosis: Secondary | ICD-10-CM

## 2013-12-28 NOTE — Patient Instructions (Signed)
Your physician has requested that you have an echocardiogram. Echocardiography is a painless test that uses sound waves to create images of your heart. It provides your doctor with information about the size and shape of your heart and how well your heart's chambers and valves are working. This procedure takes approximately one hour. There are no restrictions for this procedure.  Your physician recommends that you return for a FASTING lipid profile on the morning of Echo.  Your physician wants you to follow-up in: 1 year with Dr. Aundra Dubin. You will receive a reminder letter in the mail two months in advance. If you don't receive a letter, please call our office to schedule the follow-up appointment.

## 2013-12-30 NOTE — Progress Notes (Signed)
Patient ID: Carrie [name], female   DOB: 11-29-1945, 68 y.o.   MRN: [id number] PCP: Dr. Ronnald Ramp  68 yo with history of nonobstructive CAD, SVT s/p ablation, and moderate aortic stenosis presents for cardiology followup.  She has been doing well symptomatically.  No episodes of tachypalpitations.  No chest pain or dyspnea.  No lightheadedness or syncope.  She is tolerating Crestor.  Mild fatigue.  She tries to walk about 1/4 mile twice a day. Weight is down 1 lb since last appointment.    ECG: NSR, normal  Labs (5/12): LDL 151, HDL 74, K 4.8, creatinine 0.7, TSH normal Labs (5/13): LDL 112, HDL 86, K 4.3, creatinine 0.5 Labs (2/14): TSH normal, LDL 121, HDL 68 Labs (8/14): LDL 134, HDL 76  PMH:  1. CAD: LHC 2005 with 50-60% mid LAD stenosis.  ETT-myoview (6/10): Exercised 10 minutes, ST depression with exercise, no ischemia or infarction on perfusion images.   2. SVT ablation 2005 3. Aortic stenosis: Moderate.  Echo (3/11) with EF 55-60%, trileaflet aortic valve with mild AS with mean gradient 15 mmHg, mild MR.  Echo (6/13): EF 60-65%, mild AS mean gradient 14 mmHg, mild MR. Echo (9/14) with EF 65-70%, moderate AS with mean gradient 24 mmHg, mild LVH.  4. Hyperlipidemia 5. Osteoporosis.  FH: HTN.  Father with CABG + AVR at age 72.  Mother with CHF.   SH: Divorced.  Nonsmoker.  Lives in [place].  Works part time at [employer] and full time at [employer].   ROS: All systems reviewed and negative except as per HPI.    Current Outpatient Prescriptions  Medication Sig Dispense Refill  . aspirin 81 MG tablet Take 81 mg by mouth daily.        Marland Kitchen CALCIUM PO Take by mouth daily.      . Cholecalciferol (VITAMIN D3) 1000 UNITS CAPS Take 1 capsule by mouth daily.        . Coenzyme Q10 200 MG TABS Take 1 tablet (200 mg total) by mouth daily.    0  . fish oil-omega-3 fatty acids 1000 MG capsule Take 1 g by mouth daily.        . Fluticasone Furoate-Vilanterol (BREO ELLIPTA) 100-25 MCG/INH AEPB  Inhale 1 puff into the lungs daily.  56 each  0  . mirabegron ER (MYRBETRIQ) 25 MG TB24 tablet Take 1 tablet (25 mg total) by mouth daily.  28 tablet  0  . nitroGLYCERIN (NITROLINGUAL) 0.4 MG/SPRAY spray Place 1 spray under the tongue as directed.        . rosuvastatin (CRESTOR) 20 MG tablet Take 1 tablet (20 mg total) by mouth daily.  90 tablet  1  . Thiamine HCl (VITAMIN B-1) 250 MG tablet Take 500 mg by mouth daily.         No current facility-administered medications for this visit.    BP 122/72  Pulse 75  Ht 5\' 1"  (1.549 m)  Wt 136 lb (61.689 kg)  BMI 25.71 kg/m2 General: NAD Neck: No JVD, no thyromegaly or thyroid nodule.  Lungs: Clear to auscultation bilaterally with normal respiratory effort. CV: Nondisplaced PMI.  Heart regular S1/S2, no T4/H9, 3/6 systolic crescendo-decrescendo murmur RUSB, S2 heard clearly.  No peripheral edema.  No carotid bruit.  Normal pedal pulses.  Abdomen: Soft, nontender, no hepatosplenomegaly, no distention.  Neurologic: Alert and oriented x 3.  Psych: Normal affect. Extremities: No clubbing or cyanosis.   Assessment/Plan: 1. Aortic stenosis: Moderate on last echo.  No concerning symptoms.  She will get a repeat echo to follow.  2. Hyperlipidemia: Some myalgias with Crestor but she is taking it.  LDL was higher than ideal despite Crestor in 8/14.  However, I do not think that she will tolerate a higher dose of Crestor.  I will check lipids today, could consider Zetia if LDL remains high.    Loralie Champagne 12/30/2013

## 2014-01-02 ENCOUNTER — Telehealth: Payer: Self-pay | Admitting: Internal Medicine

## 2014-01-02 DIAGNOSIS — R059 Cough, unspecified: Secondary | ICD-10-CM

## 2014-01-02 DIAGNOSIS — R05 Cough: Secondary | ICD-10-CM

## 2014-01-02 MED ORDER — FLUTICASONE FUROATE-VILANTEROL 100-25 MCG/INH IN AEPB: 1.0000 | INHALATION_SPRAY | Freq: Every day | RESPIRATORY_TRACT | Status: DC

## 2014-01-02 NOTE — Telephone Encounter (Signed)
Patient is requesting a script for the samples of inhaler (breo) to be [facility] to [facility] [place].

## 2014-01-04 ENCOUNTER — Ambulatory Visit (HOSPITAL_COMMUNITY): Payer: Managed Care, Other (non HMO) | Attending: Cardiology | Admitting: Cardiology

## 2014-01-04 DIAGNOSIS — E785 Hyperlipidemia, unspecified: Secondary | ICD-10-CM | POA: Diagnosis not present

## 2014-01-04 DIAGNOSIS — I359 Nonrheumatic aortic valve disorder, unspecified: Secondary | ICD-10-CM | POA: Insufficient documentation

## 2014-01-04 DIAGNOSIS — I350 Nonrheumatic aortic (valve) stenosis: Secondary | ICD-10-CM

## 2014-01-04 DIAGNOSIS — I2510 Atherosclerotic heart disease of native coronary artery without angina pectoris: Secondary | ICD-10-CM | POA: Diagnosis not present

## 2014-01-04 NOTE — Progress Notes (Signed)
Echo performed. 

## 2014-01-07 ENCOUNTER — Other Ambulatory Visit (INDEPENDENT_AMBULATORY_CARE_PROVIDER_SITE_OTHER): Payer: Managed Care, Other (non HMO)

## 2014-01-07 ENCOUNTER — Other Ambulatory Visit: Payer: Managed Care, Other (non HMO)

## 2014-01-07 DIAGNOSIS — E785 Hyperlipidemia, unspecified: Secondary | ICD-10-CM | POA: Diagnosis not present

## 2014-01-07 LAB — LIPID PANEL
Cholesterol: 228 mg/dL — ABNORMAL HIGH (ref 0–200)
HDL: 63.9 mg/dL (ref >39.00)
LDL Cholesterol: 137 mg/dL — ABNORMAL HIGH (ref 0–99)
NonHDL: 164.1
Total CHOL/HDL Ratio: 4
Triglycerides: 136 mg/dL (ref 0.0–149.0)
VLDL: 27.2 mg/dL (ref 0.0–40.0)

## 2014-01-08 ENCOUNTER — Other Ambulatory Visit: Payer: Self-pay | Admitting: *Deleted

## 2014-01-08 DIAGNOSIS — I359 Nonrheumatic aortic valve disorder, unspecified: Secondary | ICD-10-CM

## 2014-01-08 DIAGNOSIS — E785 Hyperlipidemia, unspecified: Secondary | ICD-10-CM

## 2014-01-08 MED ORDER — EZETIMIBE 10 MG PO TABS: 10.0000 mg | ORAL_TABLET | Freq: Every day | ORAL | Status: DC

## 2014-01-11 ENCOUNTER — Other Ambulatory Visit: Payer: Managed Care, Other (non HMO)

## 2014-01-14 ENCOUNTER — Encounter: Payer: Self-pay | Admitting: Vascular Surgery

## 2014-01-15 ENCOUNTER — Ambulatory Visit (HOSPITAL_COMMUNITY)
Admission: RE | Admit: 2014-01-15 | Discharge: 2014-01-15 | Disposition: A | Discharge status: Home / Self Care | Payer: Managed Care, Other (non HMO) | Source: Ambulatory Visit | Attending: Vascular Surgery | Admitting: Vascular Surgery

## 2014-01-15 ENCOUNTER — Ambulatory Visit (INDEPENDENT_AMBULATORY_CARE_PROVIDER_SITE_OTHER): Payer: Managed Care, Other (non HMO) | Admitting: Vascular Surgery

## 2014-01-15 ENCOUNTER — Encounter: Payer: Self-pay | Admitting: Vascular Surgery

## 2014-01-15 VITALS — BP 159/75 | HR 68 | Resp 16 | Ht 61.5 in | Wt 137.0 lb

## 2014-01-15 DIAGNOSIS — I83893 Varicose veins of bilateral lower extremities with other complications: Secondary | ICD-10-CM

## 2014-01-15 DIAGNOSIS — I8390 Asymptomatic varicose veins of unspecified lower extremity: Secondary | ICD-10-CM

## 2014-01-15 DIAGNOSIS — I8393 Asymptomatic varicose veins of bilateral lower extremities: Secondary | ICD-10-CM | POA: Insufficient documentation

## 2014-01-15 NOTE — Progress Notes (Signed)
Subjective:     Patient ID: Carrie [name], female   DOB: 1946-04-03, 68 y.o.   MRN: [id number]  HPI this 68 year old female is referred for evaluation of possible varicose veins in both lower extremities. She has noted some prominent veins particularly on the right leg below the knee which cause itching burning and stinging sensation as the day progresses. She denies distal edema. She has no history of DVT, thrombophlebitis, stasis ulcers, or bleeding. She does not wear elastic compression stockings. Symptoms have worsened over the past few years.  Past Medical History  Diagnosis Date  . Other specified cardiac dysrhythmias(427.89)   . Undiagnosed cardiac murmurs   . Osteoporosis, unspecified   . Other and unspecified hyperlipidemia   . Coronary artery disease   . Contact lens/glasses fitting     contacts or gl;asses  . Wears hearing aid     left    History  Substance Use Topics  . Smoking status: Former Smoker    Quit date: 04/05/1999  . Smokeless tobacco: Never Used     Comment: Regular Exercise-No  . Alcohol Use: 1.8 oz/week    3 Glasses of wine per week    Family History  Problem Relation Age of Onset  . Heart disease Mother 58    CABG and valve replacement; CHF  . Hypertension Other   . Congestive Heart Failure Mother     Allergies  Allergen Reactions  . Codeine   . Crestor [Rosuvastatin] Other (See Comments)    Crestor 40mg  fatigue and exhaustion--pt tolerates crestor 20mg     Current outpatient prescriptions:aspirin 81 MG tablet, Take 81 mg by mouth daily.  , Disp: , Rfl: ;  CALCIUM PO, Take by mouth daily., Disp: , Rfl: ;  Cholecalciferol (VITAMIN D3) 1000 UNITS CAPS, Take 1 capsule by mouth daily.  , Disp: , Rfl: ;  Coenzyme Q10 200 MG TABS, Take 1 tablet (200 mg total) by mouth daily., Disp: , Rfl: 0;  ezetimibe (ZETIA) 10 MG tablet, Take 1 tablet (10 mg total) by mouth daily., Disp: 30 tablet, Rfl: 3 fish oil-omega-3 fatty acids 1000 MG capsule, Take 1 g by  mouth daily.  , Disp: , Rfl: ;  Fluticasone Furoate-Vilanterol (BREO ELLIPTA) 100-25 MCG/INH AEPB, Inhale 1 puff into the lungs daily., Disp: 69 each, Rfl: 0;  mirabegron ER (MYRBETRIQ) 25 MG TB24 tablet, Take 1 tablet (25 mg total) by mouth daily., Disp: 28 tablet, Rfl: 0;  nitroGLYCERIN (NITROLINGUAL) 0.4 MG/SPRAY spray, Place 1 spray under the tongue as directed.  , Disp: , Rfl:  rosuvastatin (CRESTOR) 20 MG tablet, Take 1 tablet (20 mg total) by mouth daily., Disp: 90 tablet, Rfl: 1;  Thiamine HCl (VITAMIN B-1) 250 MG tablet, Take 500 mg by mouth daily.  , Disp: , Rfl:   BP 159/75  Pulse 68  Resp 16  Ht 5' 1.5" (1.562 m)  Wt 137 lb (62.143 kg)  BMI 25.47 kg/m2  Body mass index is 25.47 kg/(m^2).           Review of Systems denies chest pain, dyspnea on exertion, PND, orthopnea, hemoptysis. All systems negative and a complete review of systems     Objective:   Physical Exam BP 159/75  Pulse 68  Resp 16  Ht 5' 1.5" (1.562 m)  Wt 137 lb (62.143 kg)  BMI 25.47 kg/m2  Gen.-alert and oriented x3 in no apparent distress HEENT normal for age Lungs no rhonchi or wheezing Cardiovascular regular rhythm no murmurs carotid pulses 3+  palpable no bruits audible Abdomen soft nontender no palpable masses Musculoskeletal free of  major deformities Skin clear -no rashes Neurologic normal Lower extremities 3+ femoral and dorsalis pedis pulses palpable bilaterally with no edema-right leg has prominent varicose veins in pretibial region small in size. There are some spider veins medially and laterally in the calf area bilaterally. No large bulging varicosities noted.  Data ordered bilateral venous duplex exam which I reviewed and interpreted. There is no DVT. There is deep reflux bilaterally. There is very minimal reflux in the superficial greater saphenous vein from the level. Vein is of small caliber.       Assessment:     Bilateral spider and small varicose veins right worse than  left with no significant superficial venous reflux-causing aching burning and stinging discomfort    Plan:     Only treatment would be sclerotherapy and we discussed this with patient and she declines to proceed with that.

## 2014-02-04 LAB — HM MAMMOGRAPHY

## 2014-02-08 ENCOUNTER — Ambulatory Visit (INDEPENDENT_AMBULATORY_CARE_PROVIDER_SITE_OTHER): Payer: Managed Care, Other (non HMO)

## 2014-02-08 DIAGNOSIS — Z23 Encounter for immunization: Secondary | ICD-10-CM | POA: Diagnosis not present

## 2014-02-18 ENCOUNTER — Encounter: Payer: Self-pay | Admitting: Internal Medicine

## 2014-02-26 ENCOUNTER — Encounter: Payer: Self-pay | Admitting: Internal Medicine

## 2014-03-06 ENCOUNTER — Other Ambulatory Visit: Payer: Managed Care, Other (non HMO)

## 2014-03-06 ENCOUNTER — Other Ambulatory Visit (INDEPENDENT_AMBULATORY_CARE_PROVIDER_SITE_OTHER): Payer: Managed Care, Other (non HMO)

## 2014-03-06 DIAGNOSIS — E785 Hyperlipidemia, unspecified: Secondary | ICD-10-CM

## 2014-03-06 DIAGNOSIS — I359 Nonrheumatic aortic valve disorder, unspecified: Secondary | ICD-10-CM

## 2014-03-06 LAB — LIPID PANEL
CHOLESTEROL: 186 mg/dL (ref 0–200)
HDL: 69.8 mg/dL (ref >39.00)
LDL Cholesterol: 94 mg/dL (ref 0–99)
NONHDL: 116.2
Total CHOL/HDL Ratio: 3
Triglycerides: 109 mg/dL (ref 0.0–149.0)
VLDL: 21.8 mg/dL (ref 0.0–40.0)

## 2014-03-06 LAB — HEPATIC FUNCTION PANEL
ALBUMIN: 3.7 g/dL (ref 3.5–5.2)
ALK PHOS: 48 U/L (ref 39–117)
ALT: 24 U/L (ref 0–35)
AST: 26 U/L (ref 0–37)
Bilirubin, Direct: 0.1 mg/dL (ref 0.0–0.3)
Total Bilirubin: 0.7 mg/dL (ref 0.2–1.2)
Total Protein: 7.7 g/dL (ref 6.0–8.3)

## 2014-03-11 ENCOUNTER — Ambulatory Visit (INDEPENDENT_AMBULATORY_CARE_PROVIDER_SITE_OTHER): Payer: Managed Care, Other (non HMO) | Admitting: Internal Medicine

## 2014-03-11 ENCOUNTER — Telehealth: Payer: Self-pay | Admitting: Cardiology

## 2014-03-11 ENCOUNTER — Encounter: Payer: Self-pay | Admitting: Internal Medicine

## 2014-03-11 VITALS — BP 130/66 | HR 82 | Temp 98.4°F | Wt 139.1 lb

## 2014-03-11 DIAGNOSIS — L03012 Cellulitis of left finger: Secondary | ICD-10-CM

## 2014-03-11 MED ORDER — CEPHALEXIN 500 MG PO CAPS: 500.0000 mg | ORAL_CAPSULE | Freq: Two times a day (BID) | ORAL | Status: DC

## 2014-03-11 MED ORDER — KETOCONAZOLE 2 % EX CREA: TOPICAL_CREAM | CUTANEOUS | Status: DC

## 2014-03-11 NOTE — Patient Instructions (Addendum)
Soak finger twice a day in saline. The saline can be purchased at the drugstore or you can make your own .Boil cup of salt in a gallon of water. Store mixture  in a clean container.Report Warning  signs as discussed (red streaks, pus, fever, increasing pain).  Nizoral applied topically and blown  dry with hair dryer after saline soaks twice a day. Share results with all non [facility] medical staff seen

## 2014-03-11 NOTE — Progress Notes (Signed)
   Subjective:    Patient ID: Carrie [name], female    DOB: 10-08-1945, 68 y.o.   MRN: [id number]  HPI  She has noted a knot at the nailbed of the third left finger since 02/28/14. There was no history of injury to this area or surgery.  Pain  can be upper level V on a 10 scale. It's mainly tender and painful with touch   She has been soaking the finger in warm water.  This been associated redness. No other signs of infection.  She is not a diabetic.  She had a screening cholesterol and blood pressure check @ her  office. She states that the blood pressure was in the 150s over 80s. Her cholesterol was also elevated.  She says these labs were nonfasting. She recently had labs done at her cardiologist's office which were at goal.      Review of Systems   She has no fever, chills, or sweats  There is been no purulent drainage from the nail bed.  She denies any numbness or tingling in the extremity      Objective:   Physical Exam   Positive or pertinent finding include: She appears much younger than her stated age Chest grade 4-0.9 systolic murmur She has faint rales at the bases mainly on the left. There is no increased work of breathing Pedal pulses are decreased. There is a 0.7 x 0.7 slightly erythematous lesion at the nailbed of the third left finger suggesting paronychia.  General appearance :adequately nourished; in no distress. Eyes: No conjunctival inflammation or scleral icterus is present. Heart:  Normal rate and regular rhythm. S1 and S2 normal without gallop, , click, rub or other extra sounds   Lungs:No increased work of breathing.  Vascular : all pulses equal ; no bruits present. Skin:Warm & dry.  Intact without suspicious lesions or rashes otherwise; no jaundice or tenting Lymphatic: No lymphadenopathy is noted about the head, neck, axilla          Assessment & Plan:  #1 paronychia  #2 abnormal labs, nonfasting . A copy of her recent fasting labs  was reviewed with her. Values were excellent  Plan: See orders.  Copy of her labs were provided so she can discuss it with the health staff at her office.

## 2014-03-11 NOTE — Progress Notes (Signed)
Pre visit review using our clinic review tool, if applicable. No additional management support is needed unless otherwise documented below in the visit note. 

## 2014-03-11 NOTE — Telephone Encounter (Signed)
New Message:  Mrs.Swayze is returning Linda's call in reference to some lab work that was done on 11/11. Please call  Thanks

## 2014-04-24 ENCOUNTER — Telehealth: Payer: Self-pay | Admitting: Internal Medicine

## 2014-04-24 DIAGNOSIS — R05 Cough: Secondary | ICD-10-CM

## 2014-04-24 DIAGNOSIS — R059 Cough, unspecified: Secondary | ICD-10-CM

## 2014-04-24 NOTE — Telephone Encounter (Signed)
[facility] that her breo ellipta was denied (her inhaler) and she wants to know why?  [phone]

## 2014-04-26 MED ORDER — FLUTICASONE FUROATE-VILANTEROL 100-25 MCG/INH IN AEPB: 1.0000 | INHALATION_SPRAY | Freq: Every day | RESPIRATORY_TRACT | Status: DC

## 2014-05-30 ENCOUNTER — Ambulatory Visit (INDEPENDENT_AMBULATORY_CARE_PROVIDER_SITE_OTHER): Payer: BLUE CROSS/BLUE SHIELD | Admitting: Family

## 2014-05-30 ENCOUNTER — Encounter: Payer: Self-pay | Admitting: Family

## 2014-05-30 VITALS — BP 140/84 | HR 74 | Temp 97.4°F | Resp 18 | Ht 61.0 in | Wt 137.2 lb

## 2014-05-30 DIAGNOSIS — L03012 Cellulitis of left finger: Secondary | ICD-10-CM | POA: Diagnosis not present

## 2014-05-30 DIAGNOSIS — IMO0002 Reserved for concepts with insufficient information to code with codable children: Secondary | ICD-10-CM | POA: Insufficient documentation

## 2014-05-30 MED ORDER — SULFAMETHOXAZOLE-TRIMETHOPRIM 800-160 MG PO TABS: 1.0000 | ORAL_TABLET | Freq: Two times a day (BID) | ORAL | Status: DC

## 2014-05-30 NOTE — Progress Notes (Signed)
Pre visit review using our clinic review tool, if applicable. No additional management support is needed unless otherwise documented below in the visit note. 

## 2014-05-30 NOTE — Progress Notes (Signed)
Subjective:    Patient ID: Carrie [name], female    DOB: June 06, 1945, 69 y.o.   MRN: [id number]  Chief Complaint  Patient presents with  . Hand Pain    Left hand middle finger pain, red and soreness, going on off and on since the end of november    HPI:  Carrie [name] is a 69 y.o. female who presents today for an acute visit.  Associated symptoms of left hand middle finger pain, redness and soreness has been going on since the end of November. Has soaked her finger in warm water with Epson salt which has provided minimal relief, but it continues to stay sore. Pain is intermittent and increased with touch. Was previously treated with Keflex in November.    Allergies  Allergen Reactions  . Codeine   . Crestor [Rosuvastatin] Other (See Comments)    Crestor 40mg  fatigue and exhaustion--pt tolerates crestor 20mg     Current Outpatient Prescriptions on File Prior to Visit  Medication Sig Dispense Refill  . aspirin 81 MG tablet Take 81 mg by mouth daily.      Marland Kitchen CALCIUM PO Take by mouth daily.    . cephALEXin (KEFLEX) 500 MG capsule Take 1 capsule (500 mg total) by mouth 2 (two) times daily. 14 capsule 0  . Cholecalciferol (VITAMIN D3) 1000 UNITS CAPS Take 1 capsule by mouth daily.      . Coenzyme Q10 200 MG TABS Take 1 tablet (200 mg total) by mouth daily.  0  . ezetimibe (ZETIA) 10 MG tablet Take 1 tablet (10 mg total) by mouth daily. 30 tablet 3  . fish oil-omega-3 fatty acids 1000 MG capsule Take 1 g by mouth daily.      . Fluticasone Furoate-Vilanterol (BREO ELLIPTA) 100-25 MCG/INH AEPB Inhale 1 puff into the lungs daily. 90 each 3  . ketoconazole (NIZORAL) 2 % cream Apply bid after soaking 15 g 0  . mirabegron ER (MYRBETRIQ) 25 MG TB24 tablet Take 1 tablet (25 mg total) by mouth daily. 28 tablet 0  . nitroGLYCERIN (NITROLINGUAL) 0.4 MG/SPRAY spray Place 1 spray under the tongue as directed.      . rosuvastatin (CRESTOR) 20 MG tablet Take 1 tablet (20 mg total) by mouth daily.  90 tablet 1  . Thiamine HCl (VITAMIN B-1) 250 MG tablet Take 500 mg by mouth daily.       No current facility-administered medications on file prior to visit.    Review of Systems  Constitutional: Negative for fever and chills.  Skin: Positive for rash (left middle finger).      Objective:    BP 140/84 mmHg  Pulse 74  Temp(Src) 97.4 F (36.3 C) (Oral)  Resp 18  Ht 5\' 1"  (1.549 m)  Wt 137 lb 3.2 oz (62.234 kg)  BMI 25.94 kg/m2  SpO2 95% Nursing note and vital signs reviewed.  Physical Exam  Constitutional: She is oriented to person, place, and time. She appears well-developed and well-nourished. No distress.  Cardiovascular: Normal rate, regular rhythm, normal heart sounds and intact distal pulses.   Pulmonary/Chest: Effort normal and breath sounds normal.  Neurological: She is alert and oriented to person, place, and time.  Skin: Skin is warm and dry.  Left middle finger - small, red, firm nodule at the base of the nail. No discharge noted. Mild tenderness.   Psychiatric: She has a normal mood and affect. Her behavior is normal. Judgment and thought content normal.       Assessment &  Plan:

## 2014-05-30 NOTE — Assessment & Plan Note (Signed)
Symptoms and exam consistent with paronychia of the left middle finger. Start bactrim. Continue finger soaks in Epson salt. No indication to drain at this time. If symptoms fail to improve will improve will refer for removal.

## 2014-05-30 NOTE — Patient Instructions (Addendum)
Thank you for choosing [facility].  Summary/Instructions:  Your prescription(s) have been submitted to your pharmacy or been printed and provided for you. Please take as directed and contact our office if you believe you are having problem(s) with the medication(s) or have any questions.  If your symptoms worsen or fail to improve, please contact our office for further instruction, or in case of emergency go directly to the emergency room at the closest medical facility.    Paronychia Paronychia is an inflammatory reaction involving the folds of the skin surrounding the fingernail. This is commonly caused by an infection in the skin around a nail. The most common cause of paronychia is frequent wetting of the hands (as seen with bartenders, food servers, nurses or others who wet their hands). This makes the skin around the fingernail susceptible to infection by bacteria (germs) or fungus. Other predisposing factors are:  Aggressive manicuring.  Nail biting.  Thumb sucking. The most common cause is a staphylococcal (a type of germ) infection, or a fungal (Candida) infection. When caused by a germ, it usually comes on suddenly with redness, swelling, pus and is often painful. It may get under the nail and form an abscess (collection of pus), or form an abscess around the nail. If the nail itself is infected with a fungus, the treatment is usually prolonged and may require oral medicine for up to one year. Your caregiver will determine the length of time treatment is required. The paronychia caused by bacteria (germs) may largely be avoided by not pulling on hangnails or picking at cuticles. When the infection occurs at the tips of the finger it is called felon. When the cause of paronychia is from the herpes simplex virus (HSV) it is called herpetic whitlow. TREATMENT  When an abscess is present treatment is often incision and drainage. This means that the abscess must be cut open so the pus  can get out. When this is done, the following home care instructions should be followed. HOME CARE INSTRUCTIONS   It is important to keep the affected fingers very dry. Rubber or plastic gloves over cotton gloves should be used whenever the hand must be placed in water.  Keep wound clean, dry and dressed as suggested by your caregiver between warm soaks or warm compresses.  Soak in warm water for fifteen to twenty minutes three to four times per day for bacterial infections. Fungal infections are very difficult to treat, so often require treatment for long periods of time.  For bacterial (germ) infections take antibiotics (medicine which kill germs) as directed and finish the prescription, even if the problem appears to be solved before the medicine is gone.  Only take over-the-counter or prescription medicines for pain, discomfort, or fever as directed by your caregiver. SEEK IMMEDIATE MEDICAL CARE IF:  You have redness, swelling, or increasing pain in the wound.  You notice pus coming from the wound.  You have a fever.  You notice a bad smell coming from the wound or dressing. Document Released: 10/06/2000 Document Revised: 07/05/2011 Document Reviewed: 06/07/2008 [employer] Patient Information 2015 [place], [place]. This information is not intended to replace advice given to you by your health care provider. Make sure you discuss any questions you have with your health care provider.

## 2014-07-01 ENCOUNTER — Other Ambulatory Visit (INDEPENDENT_AMBULATORY_CARE_PROVIDER_SITE_OTHER): Payer: Medicare Other

## 2014-07-01 ENCOUNTER — Telehealth: Payer: Self-pay | Admitting: Internal Medicine

## 2014-07-01 DIAGNOSIS — N3000 Acute cystitis without hematuria: Secondary | ICD-10-CM

## 2014-07-01 DIAGNOSIS — N390 Urinary tract infection, site not specified: Secondary | ICD-10-CM | POA: Insufficient documentation

## 2014-07-01 LAB — URINALYSIS, ROUTINE W REFLEX MICROSCOPIC
Bilirubin Urine: NEGATIVE
KETONES UR: NEGATIVE
Nitrite: NEGATIVE
Specific Gravity, Urine: 1.02 (ref 1.000–1.030)
Total Protein, Urine: NEGATIVE
UROBILINOGEN UA: 0.2 (ref 0.0–1.0)
Urine Glucose: NEGATIVE
pH: 6 (ref 5.0–8.0)

## 2014-07-01 NOTE — Telephone Encounter (Signed)
Patient notified

## 2014-07-01 NOTE — Telephone Encounter (Signed)
Patient is requesting a lab order for urinalysis be put in because she feels she has a bladder infection.

## 2014-07-01 NOTE — Telephone Encounter (Signed)
ordered

## 2014-07-02 LAB — CULTURE, URINE COMPREHENSIVE
Colony Count: NO GROWTH
Organism ID, Bacteria: NO GROWTH

## 2014-07-22 ENCOUNTER — Other Ambulatory Visit (INDEPENDENT_AMBULATORY_CARE_PROVIDER_SITE_OTHER): Payer: BLUE CROSS/BLUE SHIELD

## 2014-07-22 ENCOUNTER — Encounter: Payer: Self-pay | Admitting: Internal Medicine

## 2014-07-22 ENCOUNTER — Ambulatory Visit (INDEPENDENT_AMBULATORY_CARE_PROVIDER_SITE_OTHER): Payer: BLUE CROSS/BLUE SHIELD | Admitting: Internal Medicine

## 2014-07-22 VITALS — BP 134/78 | HR 76 | Temp 98.4°F | Resp 16 | Wt 137.0 lb

## 2014-07-22 DIAGNOSIS — N3281 Overactive bladder: Secondary | ICD-10-CM

## 2014-07-22 DIAGNOSIS — E785 Hyperlipidemia, unspecified: Secondary | ICD-10-CM

## 2014-07-22 DIAGNOSIS — N342 Other urethritis: Secondary | ICD-10-CM

## 2014-07-22 LAB — URINALYSIS, ROUTINE W REFLEX MICROSCOPIC
BILIRUBIN URINE: NEGATIVE
Ketones, ur: NEGATIVE
Leukocytes, UA: NEGATIVE
NITRITE: NEGATIVE
Specific Gravity, Urine: 1.02 (ref 1.000–1.030)
Total Protein, Urine: NEGATIVE
UROBILINOGEN UA: 0.2 (ref 0.0–1.0)
Urine Glucose: NEGATIVE
pH: 5.5 (ref 5.0–8.0)

## 2014-07-22 LAB — COMPREHENSIVE METABOLIC PANEL
ALBUMIN: 4.1 g/dL (ref 3.5–5.2)
ALK PHOS: 59 U/L (ref 39–117)
ALT: 17 U/L (ref 0–35)
AST: 20 U/L (ref 0–37)
BUN: 16 mg/dL (ref 6–23)
CALCIUM: 9.9 mg/dL (ref 8.4–10.5)
CHLORIDE: 104 meq/L (ref 96–112)
CO2: 24 mEq/L (ref 19–32)
Creatinine, Ser: 0.65 mg/dL (ref 0.40–1.20)
GFR: 96 mL/min (ref >60.00)
GLUCOSE: 89 mg/dL (ref 70–99)
Potassium: 4.4 mEq/L (ref 3.5–5.1)
Sodium: 137 mEq/L (ref 135–145)
Total Bilirubin: 0.4 mg/dL (ref 0.2–1.2)
Total Protein: 7.6 g/dL (ref 6.0–8.3)

## 2014-07-22 MED ORDER — SOLIFENACIN SUCCINATE 5 MG PO TABS: 5.0000 mg | ORAL_TABLET | Freq: Every day | ORAL | Status: DC

## 2014-07-22 MED ORDER — MIRABEGRON ER 50 MG PO TB24: 50.0000 mg | ORAL_TABLET | Freq: Every day | ORAL | Status: DC

## 2014-07-22 NOTE — Progress Notes (Signed)
Pre visit review using our clinic review tool, if applicable. No additional management support is needed unless otherwise documented below in the visit note. 

## 2014-07-22 NOTE — Progress Notes (Signed)
Subjective:    Patient ID: Carrie [name], female    DOB: 02-19-1946, 69 y.o.   MRN: [id number]  HPI Comments: She complains of urinary frequency with nocturia, she tells me that myrbetriq has helped "a little".  Urinary Frequency  This is a recurrent problem. The current episode started more than 1 month ago. The problem has been gradually worsening. The pain is at a severity of 0/10. The patient is experiencing no pain. There has been no fever. She is not sexually active. There is no history of pyelonephritis. Associated symptoms include frequency. Pertinent negatives include no chills, discharge, flank pain, hematuria, hesitancy, nausea, sweats, urgency or vomiting. She has tried antibiotics for the symptoms. The treatment provided no relief.      Review of Systems  Constitutional: Negative.  Negative for fever, chills, diaphoresis and fatigue.  HENT: Negative.   Eyes: Negative.   Respiratory: Negative.  Negative for cough, choking, chest tightness, shortness of breath and stridor.   Cardiovascular: Negative.  Negative for chest pain, palpitations and leg swelling.  Gastrointestinal: Negative.  Negative for nausea, vomiting, abdominal pain, diarrhea, constipation and blood in stool.  Endocrine: Negative.   Genitourinary: Positive for frequency. Negative for dysuria, hesitancy, urgency, hematuria, flank pain, decreased urine volume, enuresis, difficulty urinating and pelvic pain.  Musculoskeletal: Negative.   Skin: Negative.  Negative for rash.  Allergic/Immunologic: Negative.   Neurological: Negative.  Negative for dizziness, syncope and speech difficulty.  Hematological: Negative.  Negative for adenopathy. Does not bruise/bleed easily.  Psychiatric/Behavioral: Negative.        Objective:   Physical Exam  Constitutional: She is oriented to person, place, and time. She appears well-developed and well-nourished. No distress.  HENT:  Head: Normocephalic and atraumatic.    Mouth/Throat: Oropharynx is clear and moist. No oropharyngeal exudate.  Eyes: Conjunctivae are normal. Right eye exhibits no discharge. Left eye exhibits no discharge. No scleral icterus.  Neck: Normal range of motion. Neck supple. No JVD present. No tracheal deviation present. No thyromegaly present.  Cardiovascular: Normal rate, regular rhythm, S1 normal, S2 normal and intact distal pulses.  Exam reveals no gallop and no friction rub.   Murmur heard.  Decrescendo systolic murmur is present with a grade of 1/6   No diastolic murmur is present  Pulmonary/Chest: Effort normal and breath sounds normal. No stridor. No respiratory distress. She has no wheezes. She has no rales. She exhibits no tenderness.  Abdominal: Soft. Bowel sounds are normal. She exhibits no distension and no mass. There is no tenderness. There is no rebound and no guarding.  Musculoskeletal: Normal range of motion. She exhibits no edema or tenderness.  Lymphadenopathy:    She has no cervical adenopathy.  Neurological: She is oriented to person, place, and time.  Skin: Skin is warm and dry. No rash noted. She is not diaphoretic. No erythema. No pallor.  Vitals reviewed.   Lab Results  Component Value Date   WBC 8.3 06/22/2012   HGB 14.1 06/22/2012   HCT 41.3 06/22/2012   PLT 312.0 06/22/2012   GLUCOSE 83 06/22/2012   CHOL 186 03/06/2014   TRIG 109.0 03/06/2014   HDL 69.80 03/06/2014   LDLDIRECT 133.8 12/19/2012   LDLCALC 94 03/06/2014   ALT 24 03/06/2014   AST 26 03/06/2014   NA 139 06/22/2012   K 4.0 06/22/2012   CL 107 06/22/2012   CREATININE 0.6 06/22/2012   BUN 28* 06/22/2012   CO2 25 06/22/2012   TSH 0.82 06/22/2012  Assessment & Plan:

## 2014-07-22 NOTE — Assessment & Plan Note (Signed)
She has completed the course of bactrim Will recheck her UA and urine culture

## 2014-07-22 NOTE — Patient Instructions (Signed)
Overactive Bladder The bladder has two functions that are totally opposite of the other. One is to relax and stretch out so it can store urine (fills like a balloon), and the other is to contract and squeeze down so that it can empty the urine that it has stored. Proper functioning of the bladder is a complex mixing of these two functions. The filling and emptying of the bladder can be influenced by:  The bladder.  The spinal cord.  The brain.  The nerves going to the bladder.  Other organs that are closely related to the bladder such as prostate in males and the vagina in females. As your bladder fills with urine, nerve signals are sent from the bladder to the brain to tell you that you may need to urinate. Normal urination requires that the bladder squeeze down with sufficient strength to empty the bladder, but this also requires that the bladder squeeze down sufficiently long to finish the job. In addition the sphincter muscles, which normally keep you from leaking urine, must also relax so that the urine can pass. Coordination between the bladder muscle squeezing down and the sphincter muscles relaxing is required to make everything happen normally. With an overactive bladder sometimes the muscles of the bladder contract unexpectedly and involuntarily and this causes an urgent need to urinate. The normal response is to try to hold urine in by contracting the sphincter muscles. Sometimes the bladder contracts so strongly that the sphincter muscles cannot stop the urine from passing out and incontinence occurs. This kind of incontinence is called urge incontinence. Having an overactive bladder can be embarrassing and awkward. It can keep you from living life the way you want to. Many people think it is just something you have to put up with as you grow older or have certain health conditions. In fact, there are treatments that can help make your life easier and more pleasant. CAUSES  Many things  can cause an overactive bladder. Possibilities include:  Urinary tract infection or infection of nearby tissues such as the prostate.  Prostate enlargement.  In women, multiple pregnancies or surgery on the uterus or urethra.  Bladder stones, inflammation, or tumors.  Caffeine.  Alcohol.  Medications. For example, diuretics (drugs that help the body get rid of extra fluid) increase urine production. Some other medicines must be taken with lots of fluids.  Muscle or nerve weakness. This might be the result of a spinal cord injury, a stroke, multiple sclerosis, or Parkinson disease.  Diabetes can cause a high urine volume which fills the bladder so quickly that the normal urge to urinate is triggered very strongly. SYMPTOMS   Loss of bladder control. You feel the need to urinate and cannot make your body wait.  Sudden, strong urges to urinate.  Urinating 8 or more times a day.  Waking up to urinate two or more times a night. DIAGNOSIS  To decide if you have overactive bladder, your health care provider will probably:  Ask about symptoms you have noticed.  Ask about your overall health. This will include questions about any medications you are taking.  Do a physical examination. This will help determine if there are obvious blockages or other problems.  Order some tests. These might include:  A blood test to check for diabetes or other health issues that could be contributing to the problem.  Urine testing. This could measure the flow of urine and the pressure on the bladder.  A test of your neurological   system (the brain, spinal cord, and nerves). This is the system that senses the need to urinate. Some of these tests are called flow tests, bladder pressure tests, and electrical measurements of the sphincter muscle.  A bladder test to check whether it is emptying completely when you urinate.  Cystoscopy. This test uses a thin tube with a tiny camera on it. It offers a  look inside your urethra and bladder to see if there are problems.  Imaging tests. You might be given a contrast dye and then asked to urinate. X-rays are taken to see how your bladder is working. TREATMENT  An overactive bladder can be treated in many ways. The treatment will depend on the cause. Whether you have a mild or severe case also makes a difference. Often, treatment can be given in your health care provider's office or clinic. Be sure to discuss the different options with your caregiver. They include:  Behavioral treatments. These do not involve medication or surgery:  Bladder training. For this, you would follow a schedule to urinate at regular intervals. This helps you learn to control the urge to urinate. At first, you might be asked to wait a few minutes after feeling the urge. In time, you should be able to schedule bathroom visits an hour or more apart.  Kegel exercises. These exercises strengthen the pelvic floor muscles, which support the bladder. Toning these muscles can help control urination even if the bladder muscles are overactive. A specialist will teach you how to do these exercises correctly. They will require daily practice.  Weight loss. If you are obese or overweight, losing weight might stop your bladder from being overactive. Talk to your health care provider about how many pounds you should lose. Also ask if there is a specific program or method that would work best for you.  Diet change. This might be suggested if constipation is making your overactive bladder worse. Your health care provider or a nutritionist can explain ways to change what you eat to ease constipation. Other people might need to take in less caffeine or alcohol. Sometimes drinking fewer fluids is needed, too.  Protection. This is not an actual treatment. But, you could wear special pads to take care of any leakage while you wait for other treatments to take effect. This will help you avoid  embarrassment.  Physical treatments.  Electrical stimulation. Electrodes will send gentle pulses to the nerves or muscles that help control the bladder. The goal is to strengthen them. Sometimes this is done with the electrodes outside the body. Or, they might be placed inside the body (implanted). This treatment can take several months to have an effect.  Medications. These are usually used along with other treatments. Several medicines are available. Some are injected into the muscles involved in urination. Others come in pill form. Medications sometimes prescribed include:  Anticholinergics. These drugs block the signals that the nerves deliver to the bladder. This keeps it from releasing urine at the wrong time. Researchers think the drugs might help in other ways, too.  Imipramine. This is an antidepressant. But, it relaxes bladder muscles.  Botox. This is still experimental. Some people believe that injecting it into the bladder muscles will relax them so they work more normally. It has also been injected into the sphincter muscle when the sphincter muscle does not open properly. This is a temporary fix, however. Also, it might make matters worse, especially in older people.  Surgery.  A device might be implanted   to help manage your nerves. It works on the nerves that signal when you need to urinate.  Surgery is sometimes needed with electrical stimulation. If the electrodes are implanted, this is done through surgery.  Sometimes repairs need to be made through surgery. For example, the size of the bladder can be changed. This is usually done in severe cases only. HOME CARE INSTRUCTIONS   Take any medications your health care provider prescribed or suggested. Follow the directions carefully.  Practice any lifestyle changes that are recommended. These might include:  Drinking less fluid or drinking at different times of the day. If you need to urinate often during the night, for  example, you may need to stop drinking fluids early in the evening.  Cutting down on caffeine or alcohol. They can both make an overactive bladder worse. Caffeine is found in coffee, tea, and sodas.  Doing Kegel exercises to strengthen muscles.  Losing weight, if that is recommended.  Eating a healthy and balanced diet. This will help you avoid constipation.  Keep a journal or a log. You might be asked to record how much you drink and when, and also when you feel the need to urinate.  Learn how to care for implants or other devices, such as pessaries. SEEK MEDICAL CARE IF:   Your overactive bladder gets worse.  You feel increased pain or irritation when you urinate.  You notice blood in your urine.  You have questions about any medications or devices that your health care provider recommended.  You notice blood, pus, or swelling at the site of any test or treatment procedure.  You have an oral temperature above 102F (38.9C). SEEK IMMEDIATE MEDICAL CARE IF:  You have an oral temperature above 102F (38.9C), not controlled by medicine. Document Released: 02/06/2009 Document Revised: 08/27/2013 Document Reviewed: 02/06/2009 [employer] Patient Information 2015 [employer]. This information is not intended to replace advice given to you by your health care provider. Make sure you discuss any questions you have with your health care provider.  

## 2014-07-22 NOTE — Assessment & Plan Note (Signed)
Will monitor her LFT's, lytes, and renal function

## 2014-07-22 NOTE — Assessment & Plan Note (Signed)
Will increase the dose on myrbetriq from 25 to 50, will also add vesicare for additional control of the urinary frequency If that does not control her symptoms then will refer to urology

## 2014-07-23 LAB — CULTURE, URINE COMPREHENSIVE
Colony Count: NO GROWTH
Organism ID, Bacteria: NO GROWTH

## 2014-07-25 DIAGNOSIS — R350 Frequency of micturition: Secondary | ICD-10-CM | POA: Diagnosis not present

## 2014-08-14 ENCOUNTER — Other Ambulatory Visit: Payer: Self-pay | Admitting: Orthopedic Surgery

## 2014-08-14 DIAGNOSIS — M5489 Other dorsalgia: Secondary | ICD-10-CM

## 2014-08-22 ENCOUNTER — Other Ambulatory Visit: Payer: BLUE CROSS/BLUE SHIELD

## 2014-09-26 ENCOUNTER — Telehealth: Payer: Self-pay | Admitting: Internal Medicine

## 2014-09-26 NOTE — Telephone Encounter (Signed)
[facility]  Patient Name: Carrie [name]  DOB: 1945-05-11    Initial Comment Caller states she has some red welts on the left side of her body and also on her face.    Nurse Assessment  Nurse: Harlow Mares, RN, Suanne Marker Date/Time (Eastern Time): 09/26/2014 1:35:12 PM  Confirm and document reason for call. If symptomatic, describe symptoms. ---Caller states she has some red welts on the left side of her body and also on her face. Reports that there is a rash on the (L) side of he body. Reports that she has a rash near her (L) eye. She described the rash as not itchy. Reports that the rash is red and is not raised, except for her (L) hip area. Reports rash near her waist. Rash was first noted about hour ago, came on quickly. Denies any new meds or food recently.  Has the patient traveled out of the country within the last 30 days? ---No  Does the patient require triage? ---Yes  Related visit to physician within the last 2 weeks? ---No  Does the PT have any chronic conditions? (i.e. diabetes, asthma, etc.) ---No     Guidelines    Guideline Title Affirmed Question Affirmed Notes  Rash or Redness - Widespread Rash looks like large or small blisters (i.e., fluid filled bubbles or sacs on the skin)    Final Disposition User   Go to ED Now (or PCP triage) Harlow Mares, RN, Suanne Marker

## 2014-09-27 ENCOUNTER — Ambulatory Visit (INDEPENDENT_AMBULATORY_CARE_PROVIDER_SITE_OTHER): Payer: BLUE CROSS/BLUE SHIELD | Admitting: Family

## 2014-09-27 ENCOUNTER — Encounter: Payer: Self-pay | Admitting: Family

## 2014-09-27 VITALS — BP 138/84 | HR 76 | Temp 97.5°F | Resp 18 | Ht 61.0 in | Wt 138.0 lb

## 2014-09-27 DIAGNOSIS — L500 Allergic urticaria: Secondary | ICD-10-CM | POA: Insufficient documentation

## 2014-09-27 DIAGNOSIS — R21 Rash and other nonspecific skin eruption: Secondary | ICD-10-CM

## 2014-09-27 MED ORDER — PREDNISONE 10 MG PO TABS: ORAL_TABLET | ORAL | Status: DC

## 2014-09-27 MED ORDER — METHYLPREDNISOLONE ACETATE 80 MG/ML IJ SUSP
80.0000 mg | Freq: Once | INTRAMUSCULAR | Status: AC
  Administered 2014-09-27: 80 mg via INTRAMUSCULAR

## 2014-09-27 MED ORDER — TRIAMCINOLONE ACETONIDE 0.025 % EX OINT: 1.0000 "application " | TOPICAL_OINTMENT | Freq: Two times a day (BID) | CUTANEOUS | Status: DC

## 2014-09-27 NOTE — Progress Notes (Signed)
Subjective:    Patient ID: Carrie [name], female    DOB: 06-30-1945, 69 y.o.   MRN: [id number]  Chief Complaint  Patient presents with  . Rash    got out of the shower yesterday and has a rash on her left side to the groin area, itches, some burning but no pain, has not used anything different    HPI:  Carrie [name] is a 69 y.o. female with a PMH of coronary artery disease, aortic stenosis, osteoporosis, overactive bladder, and hyperlipidemia who presents today for an acute office visit.  This is a new problem. Associated symptom of a rash located on her left side of her trunk that started about 1 day ago when she got out of the shower. Indicates that it burns and itches so much that she can claw it. Modifying factors include cortisone cream and benedryl which have not provided any relief. Describes the rash as small patches and redness, or discharge. Indicates that it has spread since it initially started.    Allergies  Allergen Reactions  . Codeine   . Crestor [Rosuvastatin] Other (See Comments)    Crestor 40mg  fatigue and exhaustion--pt tolerates crestor 20mg     Current Outpatient Prescriptions on File Prior to Visit  Medication Sig Dispense Refill  . aspirin 81 MG tablet Take 81 mg by mouth daily.      Marland Kitchen CALCIUM PO Take by mouth daily.    . Cholecalciferol (VITAMIN D3) 1000 UNITS CAPS Take 1 capsule by mouth daily.      . Coenzyme Q10 200 MG TABS Take 1 tablet (200 mg total) by mouth daily.  0  . ezetimibe (ZETIA) 10 MG tablet Take 1 tablet (10 mg total) by mouth daily. 30 tablet 3  . fish oil-omega-3 fatty acids 1000 MG capsule Take 1 g by mouth daily.      Marland Kitchen ketoconazole (NIZORAL) 2 % cream Apply bid after soaking 15 g 0  . mirabegron ER (MYRBETRIQ) 50 MG TB24 tablet Take 1 tablet (50 mg total) by mouth daily. 30 tablet 11  . nitroGLYCERIN (NITROLINGUAL) 0.4 MG/SPRAY spray Place 1 spray under the tongue as directed.      . rosuvastatin (CRESTOR) 20 MG tablet Take 1  tablet (20 mg total) by mouth daily. 90 tablet 1  . solifenacin (VESICARE) 5 MG tablet Take 1 tablet (5 mg total) by mouth daily. 30 tablet 11  . Thiamine HCl (VITAMIN B-1) 250 MG tablet Take 500 mg by mouth daily.       No current facility-administered medications on file prior to visit.    Review of Systems  Constitutional: Negative for fever and chills.  Skin: Positive for rash.      Objective:    BP 138/84 mmHg  Pulse 76  Temp(Src) 97.5 F (36.4 C) (Oral)  Resp 18  Ht 5\' 1"  (1.549 m)  Wt 138 lb (62.596 kg)  BMI 26.09 kg/m2  SpO2 94% Nursing note and vital signs reviewed.  Physical Exam  Constitutional: She is oriented to person, place, and time. She appears well-developed and well-nourished. No distress.  Cardiovascular: Normal rate, regular rhythm, normal heart sounds and intact distal pulses.   Pulmonary/Chest: Effort normal and breath sounds normal.  Neurological: She is alert and oriented to person, place, and time.  Skin: Skin is warm and dry.  Multiple areas of reddened plaques with irregular shape spread from groin to mid abdomen.  Psychiatric: She has a normal mood and affect. Her behavior is  normal. Judgment and thought content normal.       Assessment & Plan:   Problem List Items Addressed This Visit      Musculoskeletal and Integument   Rash and nonspecific skin eruption - Primary    Symptoms and exam consistent with dermatitis. Start prednisone taper. Start triamcinolone as needed for itching. In office injection of Depo-Medrol provided. Follow up if symptoms worsen or fail to improve.      Relevant Medications   predniSONE (DELTASONE) 10 MG tablet   triamcinolone (KENALOG) 0.025 % ointment

## 2014-09-27 NOTE — Patient Instructions (Addendum)
Thank you for choosing [facility].  Summary/Instructions:  Your prescription(s) have been submitted to your pharmacy or been printed and provided for you. Please take as directed and contact our office if you believe you are having problem(s) with the medication(s) or have any questions.  If your symptoms worsen or fail to improve, please contact our office for further instruction, or in case of emergency go directly to the emergency room at the closest medical facility.    Contact Dermatitis Contact dermatitis is a reaction to certain substances that touch the skin. Contact dermatitis can be either irritant contact dermatitis or allergic contact dermatitis. Irritant contact dermatitis does not require previous exposure to the substance for a reaction to occur.Allergic contact dermatitis only occurs if you have been exposed to the substance before. Upon a repeat exposure, your body reacts to the substance.  CAUSES  Many substances can cause contact dermatitis. Irritant dermatitis is most commonly caused by repeated exposure to mildly irritating substances, such as:  Makeup.  Soaps.  Detergents.  Bleaches.  Acids.  Metal salts, such as nickel. Allergic contact dermatitis is most commonly caused by exposure to:  Poisonous plants.  Chemicals (deodorants, shampoos).  Jewelry.  Latex.  Neomycin in triple antibiotic cream.  Preservatives in products, including clothing. SYMPTOMS  The area of skin that is exposed may develop:  Dryness or flaking.  Redness.  Cracks.  Itching.  Pain or a burning sensation.  Blisters. With allergic contact dermatitis, there may also be swelling in areas such as the eyelids, mouth, or genitals.  DIAGNOSIS  Your caregiver can usually tell what the problem is by doing a physical exam. In cases where the cause is uncertain and an allergic contact dermatitis is suspected, a patch skin test may be performed to help determine the cause  of your dermatitis. TREATMENT Treatment includes protecting the skin from further contact with the irritating substance by avoiding that substance if possible. Barrier creams, powders, and gloves may be helpful. Your caregiver may also recommend:  Steroid creams or ointments applied 2 times daily. For best results, soak the rash area in cool water for 20 minutes. Then apply the medicine. Cover the area with a plastic wrap. You can store the steroid cream in the refrigerator for a "chilly" effect on your rash. That may decrease itching. Oral steroid medicines may be needed in more severe cases.  Antibiotics or antibacterial ointments if a skin infection is present.  Antihistamine lotion or an antihistamine taken by mouth to ease itching.  Lubricants to keep moisture in your skin.  Burow's solution to reduce redness and soreness or to dry a weeping rash. Mix one packet or tablet of solution in 2 cups cool water. Dip a clean washcloth in the mixture, wring it out a bit, and put it on the affected area. Leave the cloth in place for 30 minutes. Do this as often as possible throughout the day.  Taking several cornstarch or baking soda baths daily if the area is too large to cover with a washcloth. Harsh chemicals, such as alkalis or acids, can cause skin damage that is like a burn. You should flush your skin for 15 to 20 minutes with cold water after such an exposure. You should also seek immediate medical care after exposure. Bandages (dressings), antibiotics, and pain medicine may be needed for severely irritated skin.  HOME CARE INSTRUCTIONS  Avoid the substance that caused your reaction.  Keep the area of skin that is affected away from hot  water, soap, sunlight, chemicals, acidic substances, or anything else that would irritate your skin.  Do not scratch the rash. Scratching may cause the rash to become infected.  You may take cool baths to help stop the itching.  Only take over-the-counter  or prescription medicines as directed by your caregiver.  See your caregiver for follow-up care as directed to make sure your skin is healing properly. SEEK MEDICAL CARE IF:   Your condition is not better after 3 days of treatment.  You seem to be getting worse.  You see signs of infection such as swelling, tenderness, redness, soreness, or warmth in the affected area.  You have any problems related to your medicines. Document Released: 04/09/2000 Document Revised: 07/05/2011 Document Reviewed: 09/15/2010 [facility] Patient Information 2015 [place], [place]. This information is not intended to replace advice given to you by your health care provider. Make sure you discuss any questions you have with your health care provider.

## 2014-09-27 NOTE — Assessment & Plan Note (Signed)
Symptoms and exam consistent with dermatitis. Start prednisone taper. Start triamcinolone as needed for itching. In office injection of Depo-Medrol provided. Follow up if symptoms worsen or fail to improve.

## 2014-09-27 NOTE — Progress Notes (Signed)
Pre visit review using our clinic review tool, if applicable. No additional management support is needed unless otherwise documented below in the visit note. 

## 2014-10-01 ENCOUNTER — Telehealth: Payer: Self-pay | Admitting: *Deleted

## 2014-10-01 NOTE — Telephone Encounter (Signed)
[name] Patient Name: Carrie [name] Gender: Female DOB: 1946-01-11 Age: 69 Y 46 M 24 D Return Phone Number: [phone] (Primary) Address: City/State/Zip: [place] [name] [place] [name] Type Triage / Clinical Relationship To Patient Self Appointment Disposition EMR Appointment Not Necessary Info pasted into Epic Yes Return Phone Number [phone] (Primary) Chief Complaint Rash - Localized Initial Comment Caller states she has some red welts on the left side of her body and also on her face. PreDisposition Home Care Nurse Assessment Nurse: Harlow Mares, RN, Suanne Marker Date/Time (Eastern Time): 09/26/2014 1:35:12 PM Confirm and document reason for [name]. If symptomatic, describe symptoms. ---Caller states she has some red welts on the left side of her body and also on her face. Reports that there is a rash on the (L) side of he body. Reports that she has a rash near her (L) eye. She described the rash as not itchy. Reports that the rash is red and is not raised, except for her (L) hip area. Reports rash near her waist. Rash was first noted about hour ago, came on quickly. Denies any new meds or food recently. Has the patient traveled out of the country within the last 30 days? ---No Does the patient require triage? ---Yes Related visit to physician within the last 2 weeks? ---No Does the PT have any chronic conditions? (i.e. diabetes, asthma, etc.) ---No Guidelines Guideline Title Affirmed Question Affirmed Notes Nurse Date/Time (Eastern Time) Rash or Redness - Widespread Rash looks like large or small blisters (i.e., fluid filled bubbles or sacs on the skin) Harlow Mares, RN, Rhonda 09/26/2014 1:40:43 PM Disp. Time Eilene Ghazi Time) Disposition Final User 09/26/2014 1:44:37 PM Go to ED Now (or  PCP triage) Yes Harlow Mares, RN, Rosalyn Charters Understands: Yes PLEASE NOTE: All timestamps contained within this report are represented as [place] Standard Time. CONFIDENTIALTY NOTICE: This fax transmission is intended only for the addressee. It contains information that is legally privileged, confidential or otherwise protected from use or disclosure. If you are not the intended recipient, you are strictly prohibited from reviewing, disclosing, copying using or disseminating any of this information or taking any action in reliance on or regarding this information. If you have received this fax in error, please notify us immediately by telephone so that we can arrange for its return to [place]. Phone: [phone], Toll-Free: [phone], Fax: [phone] Page: 2 of 2 [name] Id: [id number] Disagree/Comply: Comply Care Advice Given Per Guideline GO TO ED NOW (OR PCP TRIAGE): * IF NO PCP TRIAGE: You need to be seen. Go to the [facility] at _[facility] within the next hour. Leave as soon as you can. BRING MEDICINES: * Please bring a list of your current medicines when you go to see the doctor. * It is also a good idea to bring the pill bottles too. This will help the doctor to make certain you are taking the right medicines and the right dose. CARE ADVICE given per Rash - Widespread and Cause Unknown (Adult) guideline. After Care Instructions Given [name] Event Type User Date / Time Description Referrals [facility]

## 2014-10-07 ENCOUNTER — Encounter: Payer: Self-pay | Admitting: Internal Medicine

## 2014-10-07 ENCOUNTER — Ambulatory Visit (INDEPENDENT_AMBULATORY_CARE_PROVIDER_SITE_OTHER): Payer: BLUE CROSS/BLUE SHIELD | Admitting: Internal Medicine

## 2014-10-07 VITALS — BP 142/78 | HR 105 | Temp 97.7°F | Ht 61.0 in | Wt 135.5 lb

## 2014-10-07 DIAGNOSIS — M810 Age-related osteoporosis without current pathological fracture: Secondary | ICD-10-CM | POA: Diagnosis not present

## 2014-10-07 DIAGNOSIS — L500 Allergic urticaria: Secondary | ICD-10-CM | POA: Diagnosis not present

## 2014-10-07 MED ORDER — DOXEPIN HCL 10 MG PO CAPS: 10.0000 mg | ORAL_CAPSULE | Freq: Every day | ORAL | Status: DC

## 2014-10-07 NOTE — Progress Notes (Signed)
Pre visit review using our clinic review tool, if applicable. No additional management support is needed unless otherwise documented below in the visit note. 

## 2014-10-07 NOTE — Patient Instructions (Signed)
Hives Hives are itchy, red, swollen areas of the skin. They can vary in size and location on your body. Hives can come and go for hours or several days (acute hives) or for several weeks (chronic hives). Hives do not spread from person to person (noncontagious). They may get worse with scratching, exercise, and emotional stress. CAUSES   Allergic reaction to food, additives, or drugs.  Infections, including the common cold.  Illness, such as vasculitis, lupus, or thyroid disease.  Exposure to sunlight, heat, or cold.  Exercise.  Stress.  Contact with chemicals. SYMPTOMS   Red or white swollen patches on the skin. The patches may change size, shape, and location quickly and repeatedly.  Itching.  Swelling of the hands, feet, and face. This may occur if hives develop deeper in the skin. DIAGNOSIS  Your caregiver can usually tell what is wrong by performing a physical exam. Skin or blood tests may also be done to determine the cause of your hives. In some cases, the cause cannot be determined. TREATMENT  Mild cases usually get better with medicines such as antihistamines. Severe cases may require an emergency epinephrine injection. If the cause of your hives is known, treatment includes avoiding that trigger.  HOME CARE INSTRUCTIONS   Avoid causes that trigger your hives.  Take antihistamines as directed by your caregiver to reduce the severity of your hives. Non-sedating or low-sedating antihistamines are usually recommended. Do not drive while taking an antihistamine.  Take any other medicines prescribed for itching as directed by your caregiver.  Wear loose-fitting clothing.  Keep all follow-up appointments as directed by your caregiver. SEEK MEDICAL CARE IF:   You have persistent or severe itching that is not relieved with medicine.  You have painful or swollen joints. SEEK IMMEDIATE MEDICAL CARE IF:   You have a fever.  Your tongue or lips are swollen.  You have  trouble breathing or swallowing.  You feel tightness in the throat or chest.  You have abdominal pain. These problems may be the first sign of a life-threatening allergic reaction. Call your local emergency services (911 in [place] MAKE SURE YOU:   Understand these instructions.  Will watch your condition.  Will get help right away if you are not doing well or get worse. Document Released: 04/12/2005 Document Revised: 04/17/2013 Document Reviewed: 07/06/2011 ExitCare Patient Information 2015 [employer]. This information is not intended to replace advice given to you by your health care provider. Make sure you discuss any questions you have with your health care provider.  

## 2014-10-09 NOTE — Progress Notes (Addendum)
Subjective:  Patient ID: Carrie [name], female    DOB: Aug 09, 1945  Age: 69 y.o. MRN: [id number]  CC: Rash   HPI Carrie [name] presents for follow up on pruritic rash over her left torso/flank area, she has tried oral and topical steroids with some relief but there is still intense itching. She is not aware of anything that could be causing the rash.  Outpatient Prescriptions Prior to Visit  Medication Sig Dispense Refill  . aspirin 81 MG tablet Take 81 mg by mouth daily.      Marland Kitchen CALCIUM PO Take by mouth daily.    . Cholecalciferol (VITAMIN D3) 1000 UNITS CAPS Take 1 capsule by mouth daily.      . Coenzyme Q10 200 MG TABS Take 1 tablet (200 mg total) by mouth daily.  0  . ezetimibe (ZETIA) 10 MG tablet Take 1 tablet (10 mg total) by mouth daily. 30 tablet 3  . fish oil-omega-3 fatty acids 1000 MG capsule Take 1 g by mouth daily.      . nitroGLYCERIN (NITROLINGUAL) 0.4 MG/SPRAY spray Place 1 spray under the tongue as directed.      . rosuvastatin (CRESTOR) 20 MG tablet Take 1 tablet (20 mg total) by mouth daily. 90 tablet 1  . solifenacin (VESICARE) 5 MG tablet Take 1 tablet (5 mg total) by mouth daily. 30 tablet 11  . Thiamine HCl (VITAMIN B-1) 250 MG tablet Take 500 mg by mouth daily.      Marland Kitchen triamcinolone (KENALOG) 0.025 % ointment Apply 1 application topically 2 (two) times daily. 30 g 0  . ketoconazole (NIZORAL) 2 % cream Apply bid after soaking (Patient not taking: Reported on 10/07/2014) 15 g 0  . mirabegron ER (MYRBETRIQ) 50 MG TB24 tablet Take 1 tablet (50 mg total) by mouth daily. (Patient not taking: Reported on 10/07/2014) 30 tablet 11  . predniSONE (DELTASONE) 10 MG tablet Take 4 tablets x 2 days, 3 tablets x 2 days, 2 tablets x 2 days and 1 tablet x 2 days (Patient not taking: Reported on 10/07/2014) 20 tablet 0   No facility-administered medications prior to visit.    ROS Review of Systems  Constitutional: Negative.  Negative for fever, chills, diaphoresis, appetite  change and fatigue.  HENT: Negative.  Negative for congestion and trouble swallowing.   Eyes: Negative.   Respiratory: Negative.  Negative for cough, choking, chest tightness, shortness of breath and stridor.   Cardiovascular: Negative.  Negative for chest pain, palpitations and leg swelling.  Gastrointestinal: Negative.  Negative for nausea, vomiting, abdominal pain, diarrhea, constipation and blood in stool.  Endocrine: Negative.   Genitourinary: Negative.   Musculoskeletal: Negative.   Skin: Positive for rash. Negative for color change, pallor and wound.  Allergic/Immunologic: Negative.  Negative for environmental allergies and food allergies.  Neurological: Negative.  Negative for dizziness, syncope, speech difficulty, weakness, light-headedness, numbness and headaches.  Hematological: Negative.  Negative for adenopathy. Does not bruise/bleed easily.  Psychiatric/Behavioral: Negative.     Objective:  BP 142/78 mmHg  Pulse 105  Temp(Src) 97.7 F (36.5 C) (Oral)  Ht 5\' 1"  (1.549 m)  Wt 135 lb 8 oz (61.462 kg)  BMI 25.62 kg/m2  SpO2 97%  BP Readings from Last 3 Encounters:  10/07/14 142/78  09/27/14 138/84  07/22/14 134/78    Wt Readings from Last 3 Encounters:  10/07/14 135 lb 8 oz (61.462 kg)  09/27/14 138 lb (62.596 kg)  07/22/14 137 lb (62.143 kg)    Physical  Exam  Constitutional: She is oriented to person, place, and time. She appears well-developed and well-nourished. No distress.  HENT:  Head: Normocephalic and atraumatic.  Mouth/Throat: Oropharynx is clear and moist. No oropharyngeal exudate.  Eyes: Conjunctivae are normal. Right eye exhibits no discharge. Left eye exhibits no discharge. No scleral icterus.  Neck: Normal range of motion. Neck supple. No JVD present. No tracheal deviation present. No thyromegaly present.  Cardiovascular: Normal rate, regular rhythm and intact distal pulses.  Exam reveals no gallop and no friction rub.   Murmur heard.   Decrescendo systolic murmur is present with a grade of 2/6   No diastolic murmur is present  Pulmonary/Chest: Effort normal and breath sounds normal. No stridor. No respiratory distress. She has no wheezes. She has no rales. She exhibits no tenderness.  Abdominal: Soft. Bowel sounds are normal. She exhibits no distension and no mass. There is no tenderness. There is no rebound and no guarding.  Musculoskeletal: Normal range of motion. She exhibits no edema or tenderness.  Lymphadenopathy:    She has no cervical adenopathy.  Neurological: She is oriented to person, place, and time.  Skin: Skin is warm, dry and intact. Rash noted. No abrasion, no bruising, no burn, no ecchymosis, no laceration, no lesion, no petechiae and no purpura noted. Rash is papular. Rash is not macular, not maculopapular, not nodular, not pustular, not vesicular and not urticarial. She is not diaphoretic. There is erythema. No pallor.     Psychiatric: She has a normal mood and affect. Her behavior is normal. Judgment and thought content normal.  Vitals reviewed.   Lab Results  Component Value Date   WBC 8.3 06/22/2012   HGB 14.1 06/22/2012   HCT 41.3 06/22/2012   PLT 312.0 06/22/2012   GLUCOSE 89 07/22/2014   CHOL 186 03/06/2014   TRIG 109.0 03/06/2014   HDL 69.80 03/06/2014   LDLDIRECT 133.8 12/19/2012   LDLCALC 94 03/06/2014   ALT 17 07/22/2014   AST 20 07/22/2014   NA 137 07/22/2014   K 4.4 07/22/2014   CL 104 07/22/2014   CREATININE 0.65 07/22/2014   BUN 16 07/22/2014   CO2 24 07/22/2014   TSH 0.82 06/22/2012    No results found.  Assessment & Plan:   Carrie [name] was seen today for rash.  Diagnoses and all orders for this visit:  Osteoporosis - she is due for her next prolia injection, will check her renal function and calcium level today Orders: -     Comprehensive metabolic panel; Future  Allergic urticaria - will start an antihistamine to control the rash and itching, she will cont the  topical steroids of they help Orders: -     doxepin (SINEQUAN) 10 MG capsule; Take 1 capsule (10 mg total) by mouth at bedtime.   I have discontinued Carrie [name]'s ketoconazole, mirabegron ER, and predniSONE. I am also having her start on doxepin. Additionally, I am having her maintain her aspirin, fish oil-omega-3 fatty acids, nitroGLYCERIN, vitamin B-1, Vitamin D3, CALCIUM PO, Coenzyme Q10, rosuvastatin, ezetimibe, solifenacin, and triamcinolone.  Meds ordered this encounter  Medications  . doxepin (SINEQUAN) 10 MG capsule    Sig: Take 1 capsule (10 mg total) by mouth at bedtime.    Dispense:  30 capsule    Refill:  2     Follow-up: Return in about 3 weeks (around 10/28/2014).  Scarlette Calico, MD

## 2014-10-16 ENCOUNTER — Telehealth: Payer: Self-pay | Admitting: Internal Medicine

## 2014-10-16 NOTE — Telephone Encounter (Signed)
Noted  

## 2014-10-16 NOTE — Telephone Encounter (Signed)
Per your request, I have electronically submitted pt's info for [place] verification and will notify you once I have a response. Thank you.

## 2014-10-21 NOTE — Telephone Encounter (Signed)
I have rec'd pt's insurance verification for Prolia.  If there is an OV, pt will have an estimated responsibility of $15 co-pay which will cover the admin and cost of Prolia; w/out an OV pt will have an estimated responsibility of $0, the admin and cost of Prolia will be covered at 100%.  Please make pt aware this is only an estimate and we will not know an exact amt until her insurance has paid.    Also, please let me know once she receives her injection so I can update her info in the Prolia portal.  If you have any questions, please let me know. Thank you.

## 2014-10-22 NOTE — Telephone Encounter (Signed)
LMOVM advising of copay and to call back to schedule nurse visit for injection.

## 2014-10-22 NOTE — Telephone Encounter (Signed)
Called patient x 2, line busy and will try again later. Need to advise of copay and schedule appointment for Prolia.

## 2014-10-23 ENCOUNTER — Ambulatory Visit (INDEPENDENT_AMBULATORY_CARE_PROVIDER_SITE_OTHER): Payer: BLUE CROSS/BLUE SHIELD

## 2014-10-23 DIAGNOSIS — M810 Age-related osteoporosis without current pathological fracture: Secondary | ICD-10-CM

## 2014-10-23 MED ORDER — DENOSUMAB 60 MG/ML ~~LOC~~ SOLN
60.0000 mg | Freq: Once | SUBCUTANEOUS | Status: AC
  Administered 2014-10-23: 60 mg via SUBCUTANEOUS

## 2014-11-16 ENCOUNTER — Encounter (HOSPITAL_BASED_OUTPATIENT_CLINIC_OR_DEPARTMENT_OTHER): Payer: Self-pay | Admitting: Emergency Medicine

## 2014-11-16 ENCOUNTER — Emergency Department (HOSPITAL_BASED_OUTPATIENT_CLINIC_OR_DEPARTMENT_OTHER)
Admission: EM | Admit: 2014-11-16 | Discharge: 2014-11-16 | Disposition: A | Discharge status: Home / Self Care | Payer: BLUE CROSS/BLUE SHIELD | Attending: Emergency Medicine | Admitting: Emergency Medicine

## 2014-11-16 DIAGNOSIS — Z79899 Other long term (current) drug therapy: Secondary | ICD-10-CM | POA: Insufficient documentation

## 2014-11-16 DIAGNOSIS — M810 Age-related osteoporosis without current pathological fracture: Secondary | ICD-10-CM | POA: Diagnosis not present

## 2014-11-16 DIAGNOSIS — R21 Rash and other nonspecific skin eruption: Secondary | ICD-10-CM | POA: Diagnosis present

## 2014-11-16 DIAGNOSIS — Z974 Presence of external hearing-aid: Secondary | ICD-10-CM | POA: Diagnosis not present

## 2014-11-16 DIAGNOSIS — I2510 Atherosclerotic heart disease of native coronary artery without angina pectoris: Secondary | ICD-10-CM | POA: Diagnosis not present

## 2014-11-16 DIAGNOSIS — E785 Hyperlipidemia, unspecified: Secondary | ICD-10-CM | POA: Insufficient documentation

## 2014-11-16 DIAGNOSIS — Z87891 Personal history of nicotine dependence: Secondary | ICD-10-CM | POA: Diagnosis not present

## 2014-11-16 DIAGNOSIS — I498 Other specified cardiac arrhythmias: Secondary | ICD-10-CM | POA: Diagnosis not present

## 2014-11-16 DIAGNOSIS — R011 Cardiac murmur, unspecified: Secondary | ICD-10-CM | POA: Diagnosis not present

## 2014-11-16 DIAGNOSIS — L500 Allergic urticaria: Secondary | ICD-10-CM | POA: Insufficient documentation

## 2014-11-16 DIAGNOSIS — Z7982 Long term (current) use of aspirin: Secondary | ICD-10-CM | POA: Insufficient documentation

## 2014-11-16 MED ORDER — METHYLPREDNISOLONE 4 MG PO TBPK: ORAL_TABLET | ORAL | Status: DC

## 2014-11-16 MED ORDER — PREDNISONE 50 MG PO TABS
60.0000 mg | ORAL_TABLET | Freq: Once | ORAL | Status: AC
  Administered 2014-11-16: 60 mg via ORAL
  Filled 2014-11-16 (×2): qty 1

## 2014-11-16 MED ORDER — FAMOTIDINE 20 MG PO TABS: 20.0000 mg | ORAL_TABLET | Freq: Two times a day (BID) | ORAL | Status: DC

## 2014-11-16 NOTE — ED Notes (Signed)
Patient states that she has a horrible rash  - on neck and right side.

## 2014-11-16 NOTE — ED Provider Notes (Signed)
69 year old female, developed a urticarial rash on her right lateral neck and across her chest after doing some gardening. On exam she has patches of urticaria, no petechiae or purpura, no shortness of breath wheezing or swelling in the oropharynx, normal phonation, appears comfortable. Given sterile age, encouraged to use Benadryl if her symptoms worsened O the patient is very resistant to doing this given that she was told this may make her heart race in the past. She expresses her understanding.  Medical screening examination/treatment/procedure(s) were conducted as a shared visit with non-physician practitioner(s) and myself.  I personally evaluated the patient during the encounter.  Clinical Impression:   Final diagnoses:  Allergic urticaria    Meds given in ED:  Medications  predniSONE (DELTASONE) tablet 60 mg (60 mg Oral Given 11/16/14 1901)    Discharge Medication List as of 11/16/2014  7:28 PM    START taking these medications   Details  methylPREDNISolone (MEDROL DOSEPAK) 4 MG TBPK tablet As directed by package insert, Print          Noemi Chapel, MD 11/16/14 2358

## 2014-11-16 NOTE — ED Provider Notes (Signed)
CSN: [id number]     Arrival date & time 11/16/14  1744 History   First MD Initiated Contact with Patient 11/16/14 1837     Chief Complaint  Patient presents with  . Rash     (Consider location/radiation/quality/duration/timing/severity/associated sxs/prior Treatment) HPI   Blood pressure 156/90, pulse 117, temperature 98.2 F (36.8 C), temperature source Oral, resp. rate 16, height 5\' 1"  (1.549 m), weight 134 lb (60.782 kg), SpO2 100 %.  Carrie [name] is a 69 y.o. female complaining of pruritic rash to right neck which is traveled to her chest and abdomen over the last 2 days. Patient denies pain, wheezing, shortness of breath, swelling, nausea, vomiting, new soaps detergents, medicines, pets. States she was working in her son's garden and she may come into contact with things she usually doesn't. She's been applying topical calamine with little relief.  Past Medical History  Diagnosis Date  . Other specified cardiac dysrhythmias(427.89)   . Undiagnosed cardiac murmurs   . Osteoporosis, unspecified   . Other and unspecified hyperlipidemia   . Coronary artery disease   . Contact lens/glasses fitting     contacts or gl;asses  . Wears hearing aid     left   Past Surgical History  Procedure Laterality Date  . Breast surgery  1982    Benign tumor removed RT  . Hernia repair  1990    rt ing  . Ganglion cyst excision      left wrist  . Tonsillectomy    . Tubal ligation    . Cardiac catheterization  2005  . Atrial ablation surgery  2005  . Bunionectomy  04/07/2012    Procedure: Lillard Anes;  Surgeon: Alta Corning, MD;  Location: [facility];  Service: Orthopedics;  Laterality: Left;  CHEVRON OSTEOTOMY LEFT FOOT    Family History  Problem Relation Age of Onset  . Heart disease Mother 80    CABG and valve replacement; CHF  . Hypertension Other   . Congestive Heart Failure Mother    History  Substance Use Topics  . Smoking status: Former Smoker    Quit  date: 04/05/1999  . Smokeless tobacco: Never Used     Comment: Regular Exercise-No  . Alcohol Use: 1.8 oz/week    3 Glasses of wine per week   OB History    No data available     Review of Systems  10 systems reviewed and found to be negative, except as noted in the HPI.   Allergies  Codeine and Crestor  Home Medications   Prior to Admission medications   Medication Sig Start Date End Date Taking? Authorizing Provider  aspirin 81 MG tablet Take 81 mg by mouth daily.      Historical Provider, MD  CALCIUM PO Take by mouth daily.    Historical Provider, MD  Cholecalciferol (VITAMIN D3) 1000 UNITS CAPS Take 1 capsule by mouth daily.      Historical Provider, MD  Coenzyme Q10 200 MG TABS Take 1 tablet (200 mg total) by mouth daily. 11/14/12   Larey Dresser, MD  doxepin (SINEQUAN) 10 MG capsule Take 1 capsule (10 mg total) by mouth at bedtime. 10/07/14   [name], MD  ezetimibe (ZETIA) 10 MG tablet Take 1 tablet (10 mg total) by mouth daily. 01/08/14   Larey Dresser, MD  fish oil-omega-3 fatty acids 1000 MG capsule Take 1 g by mouth daily.      Historical Provider, MD  nitroGLYCERIN (NITROLINGUAL) 0.4  MG/SPRAY spray Place 1 spray under the tongue as directed.      Historical Provider, MD  rosuvastatin (CRESTOR) 20 MG tablet Take 1 tablet (20 mg total) by mouth daily. 12/14/12   Larey Dresser, MD  solifenacin (VESICARE) 5 MG tablet Take 1 tablet (5 mg total) by mouth daily. 07/22/14   [name], MD  Thiamine HCl (VITAMIN B-1) 250 MG tablet Take 500 mg by mouth daily.      Historical Provider, MD  triamcinolone (KENALOG) 0.025 % ointment Apply 1 application topically 2 (two) times daily. 09/27/14   Golden Circle, FNP   BP 156/90 mmHg  Pulse 117  Temp(Src) 98.2 F (36.8 C) (Oral)  Resp 16  Ht 5\' 1"  (1.549 m)  Wt 134 lb (60.782 kg)  BMI 25.33 kg/m2  SpO2 100% Physical Exam  Constitutional: She is oriented to person, place, and time. She appears well-developed and  well-nourished. No distress.  HENT:  Head: Normocephalic.  Mouth/Throat: Oropharynx is clear and moist.  Eyes: Conjunctivae and EOM are normal. Pupils are equal, round, and reactive to light.  Cardiovascular: Normal rate.   Pulmonary/Chest: Effort normal and breath sounds normal. No stridor. No respiratory distress. She has no wheezes. She has no rales. She exhibits no tenderness.  Musculoskeletal: Normal range of motion.  Neurological: She is alert and oriented to person, place, and time.  Skin: Rash noted.  Hive-like rash to right neck chest and abdomen. Lesions are blanchable, the spare the palms soles or mucous membranes. There is no warmth or vesicular lesions.  No warmth, TTP or discharge. Lesions are blanchable; sparing palms, soles and mucous membranes.    Psychiatric: She has a normal mood and affect.  Nursing note and vitals reviewed.   ED Course  Procedures (including critical care time) Labs Review Labs Reviewed - No data to display  Imaging Review No results found.   EKG Interpretation None      MDM   Final diagnoses:  Allergic urticaria    Filed Vitals:   11/16/14 1751  BP: 156/90  Pulse: 117  Temp: 98.2 F (36.8 C)  TempSrc: Oral  Resp: 16  Height: 5\' 1"  (1.549 m)  Weight: 134 lb (60.782 kg)  SpO2: 100%    Medications  predniSONE (DELTASONE) tablet 60 mg (not administered)    Carrie [name] is a pleasant 69 y.o. female presenting with hive like rash to neck and torso. Patient is very uncomfortable, requesting aggressive treatment, will give prednisone and Pepcid.  No signs of secondary organ involvement. Patient given referral for allergy testing.  This is a shared visit with the attending physician who personally evaluated the patient and agrees with the care plan.   Evaluation does not show pathology that would require ongoing emergent intervention or inpatient treatment. Pt is hemodynamically stable and mentating appropriately. Discussed  findings and plan with patient/guardian, who agrees with care plan. All questions answered. Return precautions discussed and outpatient follow up given.   New Prescriptions   FAMOTIDINE (PEPCID) 20 MG TABLET    Take 1 tablet (20 mg total) by mouth 2 (two) times daily.   METHYLPREDNISOLONE (MEDROL DOSEPAK) 4 MG TBPK TABLET    As directed by package insert         Monico Blitz, PA-C 11/16/14 1937  Noemi Chapel, MD 11/16/14 2358

## 2014-11-16 NOTE — Discharge Instructions (Signed)
You may apply a topical hydrocortisone ointment to all affected areas except for the face.   Do not hesitate to call 911 or return to the emergency room if you develop any shortness of breath, wheezing, tongue or lip swelling.  Please follow with your primary care doctor in the next 2 days for a check-up. They must obtain records for further management.   Do not hesitate to return to the Emergency Department for any new, worsening or concerning symptoms.   Hives Hives are itchy, red, swollen areas of the skin. They can vary in size and location on your body. Hives can come and go for hours or several days (acute hives) or for several weeks (chronic hives). Hives do not spread from person to person (noncontagious). They may get worse with scratching, exercise, and emotional stress. CAUSES   Allergic reaction to food, additives, or drugs.  Infections, including the common cold.  Illness, such as vasculitis, lupus, or thyroid disease.  Exposure to sunlight, heat, or cold.  Exercise.  Stress.  Contact with chemicals. SYMPTOMS   Red or white swollen patches on the skin. The patches may change size, shape, and location quickly and repeatedly.  Itching.  Swelling of the hands, feet, and face. This may occur if hives develop deeper in the skin. DIAGNOSIS  Your caregiver can usually tell what is wrong by performing a physical exam. Skin or blood tests may also be done to determine the cause of your hives. In some cases, the cause cannot be determined. TREATMENT  Mild cases usually get better with medicines such as antihistamines. Severe cases may require an emergency epinephrine injection. If the cause of your hives is known, treatment includes avoiding that trigger.  HOME CARE INSTRUCTIONS   Avoid causes that trigger your hives.  Take antihistamines as directed by your caregiver to reduce the severity of your hives. Non-sedating or low-sedating antihistamines are usually recommended.  Do not drive while taking an antihistamine.  Take any other medicines prescribed for itching as directed by your caregiver.  Wear loose-fitting clothing.  Keep all follow-up appointments as directed by your caregiver. SEEK MEDICAL CARE IF:   You have persistent or severe itching that is not relieved with medicine.  You have painful or swollen joints. SEEK IMMEDIATE MEDICAL CARE IF:   You have a fever.  Your tongue or lips are swollen.  You have trouble breathing or swallowing.  You feel tightness in the throat or chest.  You have abdominal pain. These problems may be the first sign of a life-threatening allergic reaction. Call your local emergency services (911 in [place] MAKE SURE YOU:   Understand these instructions.  Will watch your condition.  Will get help right away if you are not doing well or get worse. Document Released: 04/12/2005 Document Revised: 04/17/2013 Document Reviewed: 07/06/2011 [facility] Patient Information 2015 [place], [place]. This information is not intended to replace advice given to you by your health care provider. Make sure you discuss any questions you have with your health care provider.

## 2014-12-02 ENCOUNTER — Ambulatory Visit: Payer: BLUE CROSS/BLUE SHIELD | Admitting: Physician Assistant

## 2015-01-10 ENCOUNTER — Encounter: Payer: Self-pay | Admitting: *Deleted

## 2015-01-10 ENCOUNTER — Ambulatory Visit (INDEPENDENT_AMBULATORY_CARE_PROVIDER_SITE_OTHER): Payer: BLUE CROSS/BLUE SHIELD | Admitting: Cardiology

## 2015-01-10 ENCOUNTER — Encounter: Payer: Self-pay | Admitting: Cardiology

## 2015-01-10 ENCOUNTER — Ambulatory Visit: Payer: BLUE CROSS/BLUE SHIELD | Admitting: Cardiology

## 2015-01-10 VITALS — BP 132/76 | HR 83 | Ht 61.0 in | Wt 135.1 lb

## 2015-01-10 DIAGNOSIS — I2510 Atherosclerotic heart disease of native coronary artery without angina pectoris: Secondary | ICD-10-CM | POA: Diagnosis not present

## 2015-01-10 DIAGNOSIS — I350 Nonrheumatic aortic (valve) stenosis: Secondary | ICD-10-CM

## 2015-01-10 NOTE — Patient Instructions (Signed)
Medication Instructions:  None today  Labwork: Your physician recommends that you return for a FASTING lipid profile.   Testing/Procedures: Your physician has requested that you have an echocardiogram. Echocardiography is a painless test that uses sound waves to create images of your heart. It provides your doctor with information about the size and shape of your heart and how well your heart's chambers and valves are working. This procedure takes approximately one hour. There are no restrictions for this procedure.    Follow-Up: Your physician wants you to follow-up in: 1 year with Dr Aundra Dubin. (September 2017). You will receive a reminder letter in the mail two months in advance. If you don't receive a letter, please call our office to schedule the follow-up appointment.

## 2015-01-12 NOTE — Progress Notes (Signed)
Patient ID: Carrie [name], female   DOB: [birth year], 69 y.o.   MRN: [id number] PCP: Dr. Ronnald Ramp  69 yo with history of nonobstructive CAD, SVT s/p ablation, and moderate aortic stenosis presents for cardiology followup.  She has been doing well symptomatically.  No episodes of tachypalpitations.  No chest pain or dyspnea.  No lightheadedness or syncope.  She is now retired and helping out with her grandchildren.  Her only complaint is that she seems to perspire easily.     Labs (5/12): LDL 151, HDL 74, K 4.8, creatinine 0.7, TSH normal Labs (5/13): LDL 112, HDL 86, K 4.3, creatinine 0.5 Labs (2/14): TSH normal, LDL 121, HDL 68 Labs (8/14): LDL 134, HDL 76 Labs (11/15): LDL 94, HDL 70 Labs (3/16): K 4.4, creatinine 0.65  ECG: NSR, LAE  PMH:  1. CAD: [place] 2005 with 50-60% mid LAD stenosis.  ETT-myoview (6/10): Exercised 10 minutes, ST depression with exercise, no ischemia or infarction on perfusion images.   2. SVT ablation 2005 3. Aortic stenosis: Moderate.  Echo (3/11) with EF 55-60%, trileaflet aortic valve with mild AS with mean gradient 15 mmHg, mild MR.  Echo (6/13): EF 60-65%, mild AS mean gradient 14 mmHg, mild MR. Echo (9/14) with EF 65-70%, moderate AS with mean gradient 24 mmHg, mild LVH.  Echo (9/15) with EF 60-65%, mild LVH, normal RV size and systolic function, moderate AS with mean gradient 24 mmHg/AVA 1.09 cm^2, mild to moderate MR, PASP 33 mmHg.  4. Hyperlipidemia 5. Osteoporosis. 6. Spinal compression fracture.   FH: HTN.  Father with CABG + AVR at age 55.  Mother with CHF.   SH: Divorced.  Nonsmoker.  Lives in [facility].  Works part time at [employer] and full time at [employer].   ROS: All systems reviewed and negative except as per HPI.    Current Outpatient Prescriptions  Medication Sig Dispense Refill  . aspirin 81 MG tablet Take 81 mg by mouth daily.      Marland Kitchen CALCIUM PO Take by mouth daily.    . Cholecalciferol (VITAMIN D3) 1000 UNITS CAPS Take 1 capsule by mouth  daily.      . Coenzyme Q10 200 MG TABS Take 1 tablet (200 mg total) by mouth daily.  0  . ezetimibe (ZETIA) 10 MG tablet Take 1 tablet (10 mg total) by mouth daily. 30 tablet 3  . fish oil-omega-3 fatty acids 1000 MG capsule Take 1 g by mouth daily.      . nitroGLYCERIN (NITROLINGUAL) 0.4 MG/SPRAY spray Place 1 spray under the tongue as directed.      . rosuvastatin (CRESTOR) 20 MG tablet Take 1 tablet (20 mg total) by mouth daily. 90 tablet 1  . solifenacin (VESICARE) 5 MG tablet Take 1 tablet (5 mg total) by mouth daily. 30 tablet 11  . Thiamine HCl (VITAMIN B-1) 250 MG tablet Take 500 mg by mouth daily.      Marland Kitchen triamcinolone (KENALOG) 0.025 % ointment Apply 1 application topically 2 (two) times daily. 30 g 0   No current facility-administered medications for this visit.    BP 132/76 mmHg  Pulse 83  Ht 5\' 1"  (1.549 m)  Wt 135 lb 1.9 oz (61.29 kg)  BMI 25.54 kg/m2 General: NAD Neck: No JVD, no thyromegaly or thyroid nodule.  Lungs: Clear to auscultation bilaterally with normal respiratory effort. CV: Nondisplaced PMI.  Heart regular S1/S2, no S5/K5, 3/6 systolic crescendo-decrescendo murmur RUSB, some obscuring of the S2.  No peripheral  edema.  No carotid bruit.  Normal pedal pulses.  Abdomen: Soft, nontender, no hepatosplenomegaly, no distention.  Neurologic: Alert and oriented x 3.  Psych: Normal affect. Extremities: No clubbing or cyanosis.   Assessment/Plan: 1. Aortic stenosis: Moderate on last echo.  Murmur with some obscuring of S2.  "Sweats easily," but no other new symptoms.   - Echo to reassess aortic stenosis.  2. Hyperlipidemia: Check lipids when she comes back for echo.  3. CAD: Moderate nonobstructive disease on prior cath.  Continue ASA 81 and statin.    Loralie Champagne 01/12/2015

## 2015-01-21 ENCOUNTER — Other Ambulatory Visit (INDEPENDENT_AMBULATORY_CARE_PROVIDER_SITE_OTHER): Payer: BLUE CROSS/BLUE SHIELD | Admitting: *Deleted

## 2015-01-21 ENCOUNTER — Other Ambulatory Visit: Payer: Self-pay

## 2015-01-21 ENCOUNTER — Ambulatory Visit (HOSPITAL_COMMUNITY): Payer: BLUE CROSS/BLUE SHIELD | Attending: Cardiology

## 2015-01-21 DIAGNOSIS — Z8249 Family history of ischemic heart disease and other diseases of the circulatory system: Secondary | ICD-10-CM | POA: Diagnosis not present

## 2015-01-21 DIAGNOSIS — I350 Nonrheumatic aortic (valve) stenosis: Secondary | ICD-10-CM

## 2015-01-21 DIAGNOSIS — I352 Nonrheumatic aortic (valve) stenosis with insufficiency: Secondary | ICD-10-CM | POA: Insufficient documentation

## 2015-01-21 DIAGNOSIS — I340 Nonrheumatic mitral (valve) insufficiency: Secondary | ICD-10-CM | POA: Insufficient documentation

## 2015-01-21 DIAGNOSIS — E785 Hyperlipidemia, unspecified: Secondary | ICD-10-CM | POA: Diagnosis not present

## 2015-01-21 LAB — LIPID PANEL
CHOLESTEROL: 282 mg/dL — AB (ref 0–200)
HDL: 80.2 mg/dL (ref >39.00)
LDL Cholesterol: 173 mg/dL — ABNORMAL HIGH (ref 0–99)
NonHDL: 201.41
Total CHOL/HDL Ratio: 4
Triglycerides: 142 mg/dL (ref 0.0–149.0)
VLDL: 28.4 mg/dL (ref 0.0–40.0)

## 2015-01-22 ENCOUNTER — Other Ambulatory Visit: Payer: Self-pay | Admitting: *Deleted

## 2015-01-22 DIAGNOSIS — Z79899 Other long term (current) drug therapy: Secondary | ICD-10-CM

## 2015-01-22 DIAGNOSIS — E785 Hyperlipidemia, unspecified: Secondary | ICD-10-CM

## 2015-01-22 MED ORDER — ROSUVASTATIN CALCIUM 20 MG PO TABS: 20.0000 mg | ORAL_TABLET | Freq: Every day | ORAL | Status: DC

## 2015-01-23 ENCOUNTER — Ambulatory Visit (INDEPENDENT_AMBULATORY_CARE_PROVIDER_SITE_OTHER): Payer: BLUE CROSS/BLUE SHIELD | Admitting: Internal Medicine

## 2015-01-23 ENCOUNTER — Encounter: Payer: Self-pay | Admitting: Internal Medicine

## 2015-01-23 ENCOUNTER — Telehealth: Payer: Self-pay | Admitting: Cardiology

## 2015-01-23 VITALS — BP 142/82 | HR 72 | Temp 98.2°F | Resp 16 | Wt 135.0 lb

## 2015-01-23 DIAGNOSIS — I2510 Atherosclerotic heart disease of native coronary artery without angina pectoris: Secondary | ICD-10-CM

## 2015-01-23 DIAGNOSIS — J209 Acute bronchitis, unspecified: Secondary | ICD-10-CM | POA: Diagnosis not present

## 2015-01-23 DIAGNOSIS — Z23 Encounter for immunization: Secondary | ICD-10-CM

## 2015-01-23 MED ORDER — AZITHROMYCIN 250 MG PO TABS: ORAL_TABLET | ORAL | Status: DC

## 2015-01-23 MED ORDER — BENZONATATE 100 MG PO CAPS: 200.0000 mg | ORAL_CAPSULE | Freq: Three times a day (TID) | ORAL | Status: DC | PRN

## 2015-01-23 NOTE — Progress Notes (Signed)
Pre visit review using our clinic review tool, if applicable. No additional management support is needed unless otherwise documented below in the visit note. 

## 2015-01-23 NOTE — Telephone Encounter (Signed)
Pt returning this office call please call her back. °

## 2015-01-23 NOTE — Telephone Encounter (Signed)
LMTCB

## 2015-01-23 NOTE — Telephone Encounter (Signed)
Spoke with patient about echo results 

## 2015-01-23 NOTE — Progress Notes (Signed)
   Subjective:    Patient ID: Carrie [name], female    DOB: 01-29-1946, 69 y.o.   MRN: [id number]  HPI As of 9/28 she developed a cough with production of green sputum. She's had rhinitis which is mainly clear but with some scant yellow. She also notes hoarseness. She's been using Mucinex DM.  She quit smoking 2000. She has no history of asthma or COPD. She has no upper respiratory tract or extrinsic symptoms otherwise.  Review of Systems Frontal headache, facial pain , nasal purulence, dental pain, sore throat , otic pain or otic discharge denied. No fever , chills or sweats.  Extrinsic symptoms of itchy, watery eyes, sneezing, or angioedema are denied. There is no  Wheezing or  paroxysmal nocturnal dyspnea.    Objective:   Physical Exam  She has a hearing aid on the left. There is mild erythema of the nasal mucosa. There is marked decrease in height of the thorax and the chest essentially sits on the hips. She has a grade 1. 5-2 systolic murmur with carotid radiation bilaterally.  General appearance:Adequately nourished; no acute distress or increased work of breathing is present.    Lymphatic: No  lymphadenopathy about the head, neck, or axilla .  Eyes: No conjunctival inflammation or lid edema is present. There is no scleral icterus.  Ears:  External ear exam shows no significant lesions or deformities.  Otoscopic examination reveals clear canals, tympanic membranes are intact bilaterally without bulging, retraction, inflammation or discharge.  Nose:  External nasal examination shows no deformity or inflammation. No septal dislocation or deviation.No obstruction to airflow.   Oral exam: Dental hygiene is good; lips and gums are healthy appearing.There is no oropharyngeal erythema or exudate .  Neck:  No deformities, thyromegaly, masses, or tenderness noted.   Supple with full range of motion without pain.   Heart:  Normal rate and regular rhythm. S1 and S2 normal without  gallop, click, rub or other extra sounds.   Lungs:Chest clear to auscultation; no wheezes, rhonchi,rales ,or rubs present.  Extremities:  No cyanosis, edema, or clubbing  noted    Skin: Warm & dry w/o tenting or jaundice. No significant lesions or rash.       Assessment & Plan:  #1 acute bronchitis  #2 nonallergic rhinitis  Plan: See orders

## 2015-01-23 NOTE — Patient Instructions (Signed)

## 2015-02-20 LAB — HM MAMMOGRAPHY

## 2015-02-21 ENCOUNTER — Encounter: Payer: Self-pay | Admitting: Internal Medicine

## 2015-03-24 ENCOUNTER — Other Ambulatory Visit: Payer: BLUE CROSS/BLUE SHIELD

## 2015-03-27 ENCOUNTER — Other Ambulatory Visit (INDEPENDENT_AMBULATORY_CARE_PROVIDER_SITE_OTHER): Payer: BLUE CROSS/BLUE SHIELD

## 2015-03-27 DIAGNOSIS — E785 Hyperlipidemia, unspecified: Secondary | ICD-10-CM | POA: Diagnosis not present

## 2015-03-28 ENCOUNTER — Other Ambulatory Visit (INDEPENDENT_AMBULATORY_CARE_PROVIDER_SITE_OTHER): Payer: BLUE CROSS/BLUE SHIELD

## 2015-03-28 DIAGNOSIS — E785 Hyperlipidemia, unspecified: Secondary | ICD-10-CM | POA: Diagnosis not present

## 2015-03-31 ENCOUNTER — Other Ambulatory Visit (INDEPENDENT_AMBULATORY_CARE_PROVIDER_SITE_OTHER): Payer: BLUE CROSS/BLUE SHIELD | Admitting: *Deleted

## 2015-03-31 DIAGNOSIS — E785 Hyperlipidemia, unspecified: Secondary | ICD-10-CM

## 2015-03-31 LAB — HEPATIC FUNCTION PANEL
ALT: 22 U/L (ref 6–29)
AST: 23 U/L (ref 10–35)
Albumin: 4 g/dL (ref 3.6–5.1)
Alkaline Phosphatase: 54 U/L (ref 33–130)
BILIRUBIN INDIRECT: 0.5 mg/dL (ref 0.2–1.2)
Bilirubin, Direct: 0.1 mg/dL (ref <=0.2)
TOTAL PROTEIN: 7.1 g/dL (ref 6.1–8.1)
Total Bilirubin: 0.6 mg/dL (ref 0.2–1.2)

## 2015-03-31 LAB — LIPID PANEL
CHOL/HDL RATIO: 2.3 ratio (ref <=5.0)
CHOLESTEROL: 195 mg/dL (ref 125–200)
HDL: 84 mg/dL (ref >=46)
LDL Cholesterol: 93 mg/dL (ref <130)
TRIGLYCERIDES: 91 mg/dL (ref <150)
VLDL: 18 mg/dL (ref <30)

## 2015-03-31 NOTE — Addendum Note (Signed)
Addended by: Eulis Foster on: 03/31/2015 10:27 AM   Modules accepted: Orders

## 2015-04-03 ENCOUNTER — Telehealth: Payer: Self-pay | Admitting: *Deleted

## 2015-04-03 DIAGNOSIS — I350 Nonrheumatic aortic (valve) stenosis: Secondary | ICD-10-CM

## 2015-04-03 DIAGNOSIS — I2510 Atherosclerotic heart disease of native coronary artery without angina pectoris: Secondary | ICD-10-CM

## 2015-04-03 MED ORDER — ROSUVASTATIN CALCIUM 40 MG PO TABS: 40.0000 mg | ORAL_TABLET | Freq: Every day | ORAL | Status: DC

## 2015-04-03 NOTE — Telephone Encounter (Signed)
Notes Recorded by Larey Dresser, MD on 04/01/2015 at 2:52 PM LDL much better, but would like < 70. Can increase Crestor to 40 mg daily.

## 2015-04-16 ENCOUNTER — Telehealth: Payer: Self-pay | Admitting: Internal Medicine

## 2015-04-16 NOTE — Telephone Encounter (Signed)
Called pt to sec AWV

## 2015-04-22 ENCOUNTER — Ambulatory Visit (INDEPENDENT_AMBULATORY_CARE_PROVIDER_SITE_OTHER): Payer: BLUE CROSS/BLUE SHIELD

## 2015-04-22 VITALS — BP 138/78 | Ht 62.0 in | Wt 133.5 lb

## 2015-04-22 DIAGNOSIS — Z0000 Encounter for general adult medical examination without abnormal findings: Secondary | ICD-10-CM

## 2015-04-22 DIAGNOSIS — Z23 Encounter for immunization: Secondary | ICD-10-CM | POA: Diagnosis not present
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
## 2015-04-22 NOTE — Patient Instructions (Addendum)
[name] , Thank you for taking time to come for your Medicare Wellness Visit. I appreciate your ongoing commitment to your health goals. Please review the following plan we discussed and let me know if I can assist you in the future.   Regarding Dexa (osteoporosis fup) scan; will discuss with Dr. Ronnald Ramp and will check with [facility] to manage any applicable copays;   Will consider colo-guard as she declines colonoscopy  Will take the prevnar today  Will schedule fup visit with Dr. Ronnald Ramp to fup on c/o of dizziness when going back to bed after getting up to bathroom at hs; This is only at hs; does not happen during the day; no c/o of heart palpitations or irregular heart beat that she felt; Just seen cardiology.    Goal is to take care of self and stay healthy   These are the goals we discussed: Goals    None      This is a list of the screening recommended for you and due dates:  Health Maintenance  Topic Date Due  .  Hepatitis C: One time screening is recommended by Center for Disease Control  (CDC) for  adults born from [birth year] through 1965.   Jan 12, 1946  . Pneumonia vaccines (2 of 2 - PCV13) 06/22/2013  . Flu Shot  11/25/2015  . Mammogram  02/19/2017  . Tetanus Vaccine  12/26/2019  . Colon Cancer Screening  01/17/2022  . DEXA scan (bone density measurement)  Completed  . Shingles Vaccine  Completed     Bone Densitometry Bone densitometry is an imaging test that uses a special X-ray to measure the amount of calcium and other minerals in your bones (bone density). This test is also known as a bone mineral density test or dual-energy X-ray absorptiometry (DXA). The test can measure bone density at your hip and your spine. It is similar to having a regular X-ray. You may have this test to:  Diagnose a condition that causes weak or thin bones (osteoporosis).  Predict your risk of a broken bone (fracture).  Determine how well osteoporosis treatment is working. LET [facility] CARE  PROVIDER KNOW ABOUT:  Any allergies you have.  All medicines you are taking, including vitamins, herbs, eye drops, creams, and over-the-counter medicines.  Previous problems you or members of your family have had with the use of anesthetics.  Any blood disorders you have.  Previous surgeries you have had.  Medical conditions you have.  Possibility of pregnancy.  Any other medical test you had within the previous 14 days that used contrast material. RISKS AND COMPLICATIONS Generally, this is a safe procedure. However, problems can occur and may include the following:  This test exposes you to a very small amount of radiation.  The risks of radiation exposure may be greater to unborn children. BEFORE THE PROCEDURE  Do not take any calcium supplements for 24 hours before having the test. You can otherwise eat and drink what you usually do.  Take off all metal jewelry, eyeglasses, dental appliances, and any other metal objects. PROCEDURE  You may lie on an exam table. There will be an X-ray generator below you and an imaging device above you.  Other devices, such as boxes or braces, may be used to position your body properly for the scan.  You will need to lie still while the machine slowly scans your body.  The images will show up on a computer monitor. AFTER THE PROCEDURE You may need more testing at  a later time.   This information is not intended to replace advice given to you by your health care provider. Make sure you discuss any questions you have with your health care provider.   Document Released: 05/04/2004 Document Revised: 05/03/2014 Document Reviewed: 09/20/2013 Elsevier Interactive Patient Education 2016 [place] in the Home  Falls can cause injuries. They can happen to people of all ages. There are many things you can do to make your home safe and to help prevent falls.  WHAT CAN I DO ON THE OUTSIDE OF MY HOME?  Regularly fix the  edges of walkways and driveways and fix any cracks.  Remove anything that might make you trip as you walk through a door, such as a raised step or threshold.  Trim any bushes or trees on the path to your home.  Use bright outdoor lighting.  Clear any walking paths of anything that might make someone trip, such as rocks or tools.  Regularly check to see if handrails are loose or broken. Make sure that both sides of any steps have handrails.  Any raised decks and porches should have guardrails on the edges.  Have any leaves, snow, or ice cleared regularly.  Use sand or salt on walking paths during winter.  Clean up any spills in your garage right away. This includes oil or grease spills. WHAT CAN I DO IN THE BATHROOM?   Use night lights.  Install grab bars by the toilet and in the tub and shower. Do not use towel bars as grab bars.  Use non-skid mats or decals in the tub or shower.  If you need to sit down in the shower, use a plastic, non-slip stool.  Keep the floor dry. Clean up any water that spills on the floor as soon as it happens.  Remove soap buildup in the tub or shower regularly.  Attach bath mats securely with double-sided non-slip rug tape.  Do not have throw rugs and other things on the floor that can make you trip. WHAT CAN I DO IN THE BEDROOM?  Use night lights.  Make sure that you have a light by your bed that is easy to reach.  Do not use any sheets or blankets that are too big for your bed. They should not hang down onto the floor.  Have a firm chair that has side arms. You can use this for support while you get dressed.  Do not have throw rugs and other things on the floor that can make you trip. WHAT CAN I DO IN THE KITCHEN?  Clean up any spills right away.  Avoid walking on wet floors.  Keep items that you use a lot in easy-to-reach places.  If you need to reach something above you, use a strong step stool that has a grab bar.  Keep  electrical cords out of the way.  Do not use floor polish or wax that makes floors slippery. If you must use wax, use non-skid floor wax.  Do not have throw rugs and other things on the floor that can make you trip. WHAT CAN I DO WITH MY STAIRS?  Do not leave any items on the stairs.  Make sure that there are handrails on both sides of the stairs and use them. Fix handrails that are broken or loose. Make sure that handrails are as long as the stairways.  Check any carpeting to make sure that it is firmly attached to the stairs. Fix any  carpet that is loose or worn.  Avoid having throw rugs at the top or bottom of the stairs. If you do have throw rugs, attach them to the floor with carpet tape.  Make sure that you have a light switch at the top of the stairs and the bottom of the stairs. If you do not have them, ask someone to add them for you. WHAT ELSE CAN I DO TO HELP PREVENT FALLS?  Wear shoes that:  Do not have high heels.  Have rubber bottoms.  Are comfortable and fit you well.  Are closed at the toe. Do not wear sandals.  If you use a stepladder:  Make sure that it is fully opened. Do not climb a closed stepladder.  Make sure that both sides of the stepladder are locked into place.  Ask someone to hold it for you, if possible.  Clearly mark and make sure that you can see:  Any grab bars or handrails.  First and last steps.  Where the edge of each step is.  Use tools that help you move around (mobility aids) if they are needed. These include:  Canes.  Walkers.  Scooters.  Crutches.  Turn on the lights when you go into a dark area. Replace any light bulbs as soon as they burn out.  Set up your furniture so you have a clear path. Avoid moving your furniture around.  If any of your floors are uneven, fix them.  If there are any pets around you, be aware of where they are.  Review your medicines with your doctor. Some medicines can make you feel dizzy.  This can increase your chance of falling. Ask your doctor what other things that you can do to help prevent falls.   This information is not intended to replace advice given to you by your health care provider. Make sure you discuss any questions you have with your health care provider.   Document Released: 02/06/2009 Document Revised: 08/27/2014 Document Reviewed: 05/17/2014 Elsevier Interactive Patient Education 2016 [place] Maintenance, Female Adopting a healthy lifestyle and getting preventive care can go a long way to promote health and wellness. Talk with your health care provider about what schedule of regular examinations is right for you. This is a good chance for you to check in with your provider about disease prevention and staying healthy. In between checkups, there are plenty of things you can do on your own. Experts have done a lot of research about which lifestyle changes and preventive measures are most likely to keep you healthy. Ask your health care provider for more information. WEIGHT AND DIET  Eat a healthy diet  Be sure to include plenty of vegetables, fruits, low-fat dairy products, and lean protein.  Do not eat a lot of foods high in solid fats, added sugars, or salt.  Get regular exercise. This is one of the most important things you can do for your health.  Most adults should exercise for at least 150 minutes each week. The exercise should increase your heart rate and make you sweat (moderate-intensity exercise).  Most adults should also do strengthening exercises at least twice a week. This is in addition to the moderate-intensity exercise.  Maintain a healthy weight  Body mass index (BMI) is a measurement that can be used to identify possible weight problems. It estimates body fat based on height and weight. Your health care provider can help determine your BMI and help you achieve or maintain a  healthy weight.  For females 41 years of age and  older:   A BMI below 18.5 is considered underweight.  A BMI of 18.5 to 24.9 is normal.  A BMI of 25 to 29.9 is considered overweight.  A BMI of 30 and above is considered obese.  Watch levels of cholesterol and blood lipids  You should start having your blood tested for lipids and cholesterol at 69 years of age, then have this test every 5 years.  You may need to have your cholesterol levels checked more often if:  Your lipid or cholesterol levels are high.  You are older than 69 years of age.  You are at high risk for heart disease.  CANCER SCREENING   Lung Cancer  Lung cancer screening is recommended for adults 26-60 years old who are at high risk for lung cancer because of a history of smoking.  A yearly low-dose CT scan of the lungs is recommended for people who:  Currently smoke.  Have quit within the past 15 years.  Have at least a 30-pack-year history of smoking. A pack year is smoking an average of one pack of cigarettes a day for 1 year.  Yearly screening should continue until it has been 15 years since you quit.  Yearly screening should stop if you develop a health problem that would prevent you from having lung cancer treatment.  Breast Cancer  Practice breast self-awareness. This means understanding how your breasts normally appear and feel.  It also means doing regular breast self-exams. Let your health care provider know about any changes, no matter how small.  If you are in your 20s or 30s, you should have a clinical breast exam (CBE) by a health care provider every 1-3 years as part of a regular health exam.  If you are 53 or older, have a CBE every year. Also consider having a breast X-ray (mammogram) every year.  If you have a family history of breast cancer, talk to your health care provider about genetic screening.  If you are at high risk for breast cancer, talk to your health care provider about having an MRI and a mammogram every  year.  Breast cancer gene (BRCA) assessment is recommended for women who have family members with BRCA-related cancers. BRCA-related cancers include:  Breast.  Ovarian.  Tubal.  Peritoneal cancers.  Results of the assessment will determine the need for genetic counseling and BRCA1 and BRCA2 testing. Cervical Cancer Your health care provider may recommend that you be screened regularly for cancer of the pelvic organs (ovaries, uterus, and vagina). This screening involves a pelvic examination, including checking for microscopic changes to the surface of your cervix (Pap test). You may be encouraged to have this screening done every 3 years, beginning at age 52.  For women ages 61-65, health care providers may recommend pelvic exams and Pap testing every 3 years, or they may recommend the Pap and pelvic exam, combined with testing for human papilloma virus (HPV), every 5 years. Some types of HPV increase your risk of cervical cancer. Testing for HPV may also be done on women of any age with unclear Pap test results.  Other health care providers may not recommend any screening for nonpregnant women who are considered low risk for pelvic cancer and who do not have symptoms. Ask your health care provider if a screening pelvic exam is right for you.  If you have had past treatment for cervical cancer or a condition that could lead  to cancer, you need Pap tests and screening for cancer for at least 20 years after your treatment. If Pap tests have been discontinued, your risk factors (such as having a new sexual partner) need to be reassessed to determine if screening should resume. Some women have medical problems that increase the chance of getting cervical cancer. In these cases, your health care provider may recommend more frequent screening and Pap tests. Colorectal Cancer  This type of cancer can be detected and often prevented.  Routine colorectal cancer screening usually begins at 69 years of  age and continues through 69 years of age.  Your health care provider may recommend screening at an earlier age if you have risk factors for colon cancer.  Your health care provider may also recommend using home test kits to check for hidden blood in the stool.  A small camera at the end of a tube can be used to examine your colon directly (sigmoidoscopy or colonoscopy). This is done to check for the earliest forms of colorectal cancer.  Routine screening usually begins at age 12.  Direct examination of the colon should be repeated every 5-10 years through 69 years of age. However, you may need to be screened more often if early forms of precancerous polyps or small growths are found. Skin Cancer  Check your skin from head to toe regularly.  Tell your health care provider about any new moles or changes in moles, especially if there is a change in a mole's shape or color.  Also tell your health care provider if you have a mole that is larger than the size of a pencil eraser.  Always use sunscreen. Apply sunscreen liberally and repeatedly throughout the day.  Protect yourself by wearing long sleeves, pants, a wide-brimmed hat, and sunglasses whenever you are outside. HEART DISEASE, DIABETES, AND HIGH BLOOD PRESSURE   High blood pressure causes heart disease and increases the risk of stroke. High blood pressure is more likely to develop in:  People who have blood pressure in the high end of the normal range (130-139/85-89 mm Hg).  People who are overweight or obese.  People who are African American.  If you are 102-32 years of age, have your blood pressure checked every 3-5 years. If you are 53 years of age or older, have your blood pressure checked every year. You should have your blood pressure measured twice--once when you are at a hospital or clinic, and once when you are not at a hospital or clinic. Record the average of the two measurements. To check your blood pressure when you are  not at a hospital or clinic, you can use:  An automated blood pressure machine at a pharmacy.  A home blood pressure monitor.  If you are between 12 years and 84 years old, ask your health care provider if you should take aspirin to prevent strokes.  Have regular diabetes screenings. This involves taking a blood sample to check your fasting blood sugar level.  If you are at a normal weight and have a low risk for diabetes, have this test once every three years after 69 years of age.  If you are overweight and have a high risk for diabetes, consider being tested at a younger age or more often. PREVENTING INFECTION  Hepatitis B  If you have a higher risk for hepatitis B, you should be screened for this virus. You are considered at high risk for hepatitis B if:  You were born in a country  where hepatitis B is common. Ask your health care provider which countries are considered high risk.  Your parents were born in a high-risk country, and you have not been immunized against hepatitis B (hepatitis B vaccine).  You have HIV or AIDS.  You use needles to inject street drugs.  You live with someone who has hepatitis B.  You have had sex with someone who has hepatitis B.  You get hemodialysis treatment.  You take certain medicines for conditions, including cancer, organ transplantation, and autoimmune conditions. Hepatitis C  Blood testing is recommended for:  Everyone born from [birth year] through 1965.  Anyone with known risk factors for hepatitis C. Sexually transmitted infections (STIs)  You should be screened for sexually transmitted infections (STIs) including gonorrhea and chlamydia if:  You are sexually active and are younger than 69 years of age.  You are older than 69 years of age and your health care provider tells you that you are at risk for this type of infection.  Your sexual activity has changed since you were last screened and you are at an increased risk for  chlamydia or gonorrhea. Ask your health care provider if you are at risk.  If you do not have HIV, but are at risk, it may be recommended that you take a prescription medicine daily to prevent HIV infection. This is called pre-exposure prophylaxis (PrEP). You are considered at risk if:  You are sexually active and do not regularly use condoms or know the HIV status of your partner(s).  You take drugs by injection.  You are sexually active with a partner who has HIV. Talk with your health care provider about whether you are at high risk of being infected with HIV. If you choose to begin PrEP, you should first be tested for HIV. You should then be tested every 3 months for as long as you are taking PrEP.  PREGNANCY   If you are premenopausal and you may become pregnant, ask your health care provider about preconception counseling.  If you may become pregnant, take 400 to 800 micrograms (mcg) of folic acid every day.  If you want to prevent pregnancy, talk to your health care provider about birth control (contraception). OSTEOPOROSIS AND MENOPAUSE   Osteoporosis is a disease in which the bones lose minerals and strength with aging. This can result in serious bone fractures. Your risk for osteoporosis can be identified using a bone density scan.  If you are 34 years of age or older, or if you are at risk for osteoporosis and fractures, ask your health care provider if you should be screened.  Ask your health care provider whether you should take a calcium or vitamin D supplement to lower your risk for osteoporosis.  Menopause may have certain physical symptoms and risks.  Hormone replacement therapy may reduce some of these symptoms and risks. Talk to your health care provider about whether hormone replacement therapy is right for you.  HOME CARE INSTRUCTIONS   Schedule regular health, dental, and eye exams.  Stay current with your immunizations.   Do not use any tobacco products  including cigarettes, chewing tobacco, or electronic cigarettes.  If you are pregnant, do not drink alcohol.  If you are breastfeeding, limit how much and how often you drink alcohol.  Limit alcohol intake to no more than 1 drink per day for nonpregnant women. One drink equals 12 ounces of beer, 5 ounces of wine, or 1 ounces of hard liquor.  Do not  use street drugs.  Do not share needles.  Ask your health care provider for help if you need support or information about quitting drugs.  Tell your health care provider if you often feel depressed.  Tell your health care provider if you have ever been abused or do not feel safe at home.   This information is not intended to replace advice given to you by your health care provider. Make sure you discuss any questions you have with your health care provider.   Document Released: 10/26/2010 Document Revised: 05/03/2014 Document Reviewed: 03/14/2013 Elsevier Interactive Patient Education 2016 [place] you for enrolling in [facility]. Please follow the instructions below to securely access your online medical record. MyChart allows you to send messages to your doctor, view your test results, manage appointments, and more.   How Do I Sign Up? 1. In your Internet browser, go to [employer] and enter [email or web address]. 2. Click on the Sign Up Now link in the Sign In box. You will see the New Member Sign Up page. 3. Enter your MyChart Access Code exactly as it appears below. You will not need to use this code after you've completed the sign-up process. If you do not sign up before the expiration date, you must request a new code.  MyChart Access Code: [email or web address] Expires: 05/24/2015 12:51 PM  4. Enter your Social Security Number ([email or web address]) and Date of Birth (mm/dd/yyyy) as indicated and click Submit. You will be taken to the next sign-up page. 5. Create a MyChart ID. This will be your MyChart login ID and  cannot be changed, so think of one that is secure and easy to remember. 6. Create a MyChart password. You can change your password at any time. 7. Enter your Password Reset Question and Answer. This can be used at a later time if you forget your password.  8. Enter your e-mail address. You will receive e-mail notification when new information is available in [place]. 9. Click Sign Up. You can now view your medical record.   Additional Information Remember, MyChart is NOT to be used for urgent needs. For medical emergencies, dial 911.

## 2015-04-23 ENCOUNTER — Telehealth: Payer: Self-pay

## 2015-04-23 NOTE — Telephone Encounter (Signed)
Call and LVM for call back for fup to [facility]. Dr. Ronnald Ramp would like to see in regard to her complaints of dizziness and to schedule fup.

## 2015-04-25 NOTE — Telephone Encounter (Signed)
Call back and LVM for the patient to call and schedule an apt with Dr. Ronnald Ramp as we discussed. Left my direct number of practice number

## 2015-05-27 ENCOUNTER — Ambulatory Visit (INDEPENDENT_AMBULATORY_CARE_PROVIDER_SITE_OTHER): Payer: Medicare Other | Admitting: Internal Medicine

## 2015-05-27 ENCOUNTER — Encounter: Payer: Self-pay | Admitting: Internal Medicine

## 2015-05-27 ENCOUNTER — Ambulatory Visit (INDEPENDENT_AMBULATORY_CARE_PROVIDER_SITE_OTHER)
Admission: RE | Admit: 2015-05-27 | Discharge: 2015-05-27 | Disposition: A | Discharge status: Home / Self Care | Payer: Medicare Other | Source: Ambulatory Visit | Attending: Internal Medicine | Admitting: Internal Medicine

## 2015-05-27 VITALS — BP 128/80 | HR 97 | Temp 98.3°F | Resp 16 | Ht 62.0 in | Wt 132.0 lb

## 2015-05-27 DIAGNOSIS — J301 Allergic rhinitis due to pollen: Secondary | ICD-10-CM | POA: Diagnosis not present

## 2015-05-27 DIAGNOSIS — E785 Hyperlipidemia, unspecified: Secondary | ICD-10-CM

## 2015-05-27 DIAGNOSIS — J189 Pneumonia, unspecified organism: Secondary | ICD-10-CM | POA: Insufficient documentation

## 2015-05-27 DIAGNOSIS — I2510 Atherosclerotic heart disease of native coronary artery without angina pectoris: Secondary | ICD-10-CM

## 2015-05-27 DIAGNOSIS — Z1159 Encounter for screening for other viral diseases: Secondary | ICD-10-CM

## 2015-05-27 MED ORDER — AZITHROMYCIN 500 MG PO TABS
500.0000 mg | ORAL_TABLET | Freq: Every day | ORAL | Status: DC
Start: 2015-05-27 — End: 2015-11-21

## 2015-05-27 MED ORDER — AZELASTINE HCL 0.1 % NA SOLN: 2.0000 | Freq: Two times a day (BID) | NASAL | Status: DC

## 2015-05-27 NOTE — Progress Notes (Signed)
Subjective:  Patient ID: Carrie [name], female    DOB: [birth year]  Age: 70 y.o. MRN: [id number]  CC: Cough   HPI Carrie [name] presents for a one-week history of cough productive of thick yellow phlegm. She has also had congestion and laryngitis. She complains of her chronic runny nose as well.  Outpatient Prescriptions Prior to Visit  Medication Sig Dispense Refill  . aspirin 81 MG tablet Take 81 mg by mouth daily.      Marland Kitchen CALCIUM PO Take by mouth daily.    . Cholecalciferol (VITAMIN D3) 1000 UNITS CAPS Take 1 capsule by mouth daily.      . Coenzyme Q10 200 MG TABS Take 1 tablet (200 mg total) by mouth daily.  0  . ezetimibe (ZETIA) 10 MG tablet Take 1 tablet (10 mg total) by mouth daily. 30 tablet 3  . fish oil-omega-3 fatty acids 1000 MG capsule Take 1 g by mouth daily.      . nitroGLYCERIN (NITROLINGUAL) 0.4 MG/SPRAY spray Place 1 spray under the tongue as directed.      . rosuvastatin (CRESTOR) 40 MG tablet Take 1 tablet (40 mg total) by mouth daily. 30 tablet 3  . solifenacin (VESICARE) 5 MG tablet Take 1 tablet (5 mg total) by mouth daily. (Patient not taking: Reported on 04/22/2015) 30 tablet 11  . Thiamine HCl (VITAMIN B-1) 250 MG tablet Take 500 mg by mouth daily.      Marland Kitchen triamcinolone (KENALOG) 0.025 % ointment Apply 1 application topically 2 (two) times daily. 30 g 0  . benzonatate (TESSALON PERLES) 100 MG capsule Take 2 capsules (200 mg total) by mouth 3 (three) times daily as needed for cough. (Patient not taking: Reported on 04/22/2015) 15 capsule 0   No facility-administered medications prior to visit.    ROS Review of Systems  Constitutional: Negative.  Negative for fever, chills, diaphoresis, appetite change and fatigue.  HENT: Positive for congestion, postnasal drip and rhinorrhea. Negative for facial swelling, sinus pressure, sore throat, tinnitus and trouble swallowing.   Eyes: Negative.   Respiratory: Positive for cough. Negative for apnea, choking, chest  tightness, shortness of breath, wheezing and stridor.   Cardiovascular: Negative.  Negative for chest pain, palpitations and leg swelling.  Gastrointestinal: Negative.  Negative for nausea, vomiting, abdominal pain, diarrhea and constipation.  Endocrine: Negative.   Genitourinary: Negative.   Musculoskeletal: Negative.  Negative for myalgias, back pain, arthralgias and neck pain.  Skin: Negative.  Negative for color change and rash.  Allergic/Immunologic: Negative.   Neurological: Negative.  Negative for dizziness, tremors, weakness, light-headedness and numbness.  Hematological: Negative.  Negative for adenopathy. Does not bruise/bleed easily.  Psychiatric/Behavioral: Negative.     Objective:  BP 128/80 mmHg  Pulse 97  Temp(Src) 98.3 F (36.8 C) (Oral)  Resp 16  Ht 5\' 2"  (1.575 m)  Wt 132 lb (59.875 kg)  BMI 24.14 kg/m2  SpO2 97%  BP Readings from Last 3 Encounters:  05/27/15 128/80  04/22/15 138/78  01/23/15 142/82    Wt Readings from Last 3 Encounters:  05/27/15 132 lb (59.875 kg)  04/22/15 133 lb 8 oz (60.555 kg)  01/23/15 135 lb (61.236 kg)    Physical Exam  Constitutional: She is oriented to person, place, and time.  Non-toxic appearance. She does not have a sickly appearance. She does not appear ill. No distress.  HENT:  Head: Normocephalic and atraumatic.  Nose: Mucosal edema and rhinorrhea present. Right sinus exhibits no maxillary sinus tenderness and  no frontal sinus tenderness. Left sinus exhibits no maxillary sinus tenderness and no frontal sinus tenderness.  Mouth/Throat: Oropharynx is clear and moist and mucous membranes are normal. Mucous membranes are not pale, not dry and not cyanotic. No oropharyngeal exudate, posterior oropharyngeal edema or tonsillar abscesses.  Eyes: Conjunctivae are normal. Right eye exhibits no discharge. Left eye exhibits no discharge. No scleral icterus.  Neck: Normal range of motion. Neck supple. No JVD present. No tracheal  deviation present. No thyromegaly present.  Cardiovascular: Normal rate, regular rhythm, normal heart sounds and intact distal pulses.  Exam reveals no gallop and no friction rub.   No murmur heard. Pulmonary/Chest: Effort normal and breath sounds normal. No stridor. No respiratory distress. She has no wheezes. She has no rales. She exhibits no tenderness.  Abdominal: Soft. Bowel sounds are normal. She exhibits no distension and no mass. There is no tenderness. There is no rebound and no guarding.  Musculoskeletal: Normal range of motion. She exhibits no edema or tenderness.  Lymphadenopathy:    She has no cervical adenopathy.  Neurological: She is oriented to person, place, and time.  Skin: Skin is warm and dry. No rash noted. She is not diaphoretic. No erythema. No pallor.  Vitals reviewed.   Lab Results  Component Value Date   WBC 8.3 06/22/2012   HGB 14.1 06/22/2012   HCT 41.3 06/22/2012   PLT 312.0 06/22/2012   GLUCOSE 89 07/22/2014   CHOL 195 03/31/2015   TRIG 91 03/31/2015   HDL 84 03/31/2015   LDLDIRECT 133.8 12/19/2012   LDLCALC 93 03/31/2015   ALT 22 03/31/2015   AST 23 03/31/2015   NA 137 07/22/2014   K 4.4 07/22/2014   CL 104 07/22/2014   CREATININE 0.65 07/22/2014   BUN 16 07/22/2014   CO2 24 07/22/2014   TSH 0.82 06/22/2012    No results found.  Assessment & Plan:   Carrie [name] was seen today for cough.  Diagnoses and all orders for this visit:  Need for hepatitis C screening test -     Hepatitis C Antibody; Future  Atherosclerosis of native coronary artery of native heart without angina pectoris  Hyperlipidemia with target LDL less than 160  Allergic rhinitis due to pollen -     azelastine (ASTELIN) 0.1 % nasal spray; Place 2 sprays into both nostrils 2 (two) times daily. Use in each nostril as directed  Bilateral pneumonia- she did not want a cough suppressant, will treat the infection with Zithromax. -     DG Chest 2 View; Future -     azithromycin  (ZITHROMAX) 500 MG tablet; Take 1 tablet (500 mg total) by mouth daily.   I have discontinued Ms. Garcialopez's benzonatate. I am also having her start on azelastine and azithromycin. Additionally, I am having her maintain her aspirin, fish oil-omega-3 fatty acids, nitroGLYCERIN, vitamin B-1, Vitamin D3, CALCIUM PO, Coenzyme Q10, ezetimibe, solifenacin, triamcinolone, and rosuvastatin.  Meds ordered this encounter  Medications  . azelastine (ASTELIN) 0.1 % nasal spray    Sig: Place 2 sprays into both nostrils 2 (two) times daily. Use in each nostril as directed    Dispense:  30 mL    Refill:  11  . azithromycin (ZITHROMAX) 500 MG tablet    Sig: Take 1 tablet (500 mg total) by mouth daily.    Dispense:  3 tablet    Refill:  0     Follow-up: Return if symptoms worsen or fail to improve.  Scarlette Calico, MD

## 2015-05-27 NOTE — Progress Notes (Signed)
Pre visit review using our clinic review tool, if applicable. No additional management support is needed unless otherwise documented below in the visit note. 

## 2015-05-27 NOTE — Patient Instructions (Signed)

## 2015-05-29 ENCOUNTER — Other Ambulatory Visit: Payer: Medicare Other

## 2015-06-05 ENCOUNTER — Other Ambulatory Visit (INDEPENDENT_AMBULATORY_CARE_PROVIDER_SITE_OTHER): Payer: Medicare Other | Admitting: *Deleted

## 2015-06-05 DIAGNOSIS — I2510 Atherosclerotic heart disease of native coronary artery without angina pectoris: Secondary | ICD-10-CM

## 2015-06-05 DIAGNOSIS — I350 Nonrheumatic aortic (valve) stenosis: Secondary | ICD-10-CM | POA: Diagnosis not present

## 2015-06-05 LAB — HEPATIC FUNCTION PANEL
ALBUMIN: 3.9 g/dL (ref 3.6–5.1)
ALT: 23 U/L (ref 6–29)
AST: 24 U/L (ref 10–35)
Alkaline Phosphatase: 65 U/L (ref 33–130)
BILIRUBIN TOTAL: 0.4 mg/dL (ref 0.2–1.2)
Bilirubin, Direct: 0.1 mg/dL (ref <=0.2)
Indirect Bilirubin: 0.3 mg/dL (ref 0.2–1.2)
TOTAL PROTEIN: 7.4 g/dL (ref 6.1–8.1)

## 2015-06-05 LAB — LIPID PANEL
CHOL/HDL RATIO: 2.9 ratio (ref <=5.0)
Cholesterol: 208 mg/dL — ABNORMAL HIGH (ref 125–200)
HDL: 72 mg/dL (ref >=46)
LDL Cholesterol: 106 mg/dL (ref <130)
TRIGLYCERIDES: 150 mg/dL — AB (ref <150)
VLDL: 30 mg/dL (ref <30)

## 2015-06-05 NOTE — Addendum Note (Signed)
Addended by: Eulis Foster on: 06/05/2015 11:13 AM   Modules accepted: Orders

## 2015-06-09 ENCOUNTER — Telehealth: Payer: Self-pay | Admitting: *Deleted

## 2015-06-09 DIAGNOSIS — E785 Hyperlipidemia, unspecified: Secondary | ICD-10-CM

## 2015-06-09 NOTE — Telephone Encounter (Signed)
-----   Message from Larey Dresser, MD sent at 06/08/2015 10:26 PM EST ----- Would ideally see LDL < 70.  Increase Crestor to 40 mg daily with lipids/LFTs in 2 months.

## 2015-06-09 NOTE — Telephone Encounter (Signed)
Informed pt of lab results. Med list states that pt is already on Crestor 40mg  but pt states that she had not started taking them yet because she still had several 20mg 's left. Advised pt to start 40mg 's and we will recheck labs in 2 months. Scheduled labs for 08/11/15.

## 2015-06-23 ENCOUNTER — Telehealth: Payer: Self-pay | Admitting: Internal Medicine

## 2015-06-23 NOTE — Telephone Encounter (Signed)
Pt states [employer] is not going to pay for her prolia injection she had on 10/23/14.  We did get verification from her insurance before the injection. They said it could've been coded wrong.

## 2015-06-23 NOTE — Telephone Encounter (Signed)
Hi Carrie [name], I forward the message below to [facility] and she responded:  "The dx code is osteoporosis, not sure if the insurance is wanting a more specific location of osteoporosis? Insurance Probably needs to be called to find out exactly why they are denying. As far as I can see it is coded correctly, unless they want location of osteoporosis. Sorry I couldn't be of more help."  I was wondering if you can help with this.  Thank you

## 2015-06-27 NOTE — Telephone Encounter (Signed)
Left detailed msg on vm.

## 2015-06-27 NOTE — Telephone Encounter (Signed)
Hi Lori, this appears to have been coded correctly and it is not certain why [employer] has denied it when they have been paying it.  It is under review so once there is an answer I will let you know.  Please let pt know that as of now, we still have it insurance responsibility.  If you have further questions, please let me know.  Thank you.

## 2015-06-27 NOTE — Telephone Encounter (Signed)
Patient has called back regarding the encounter below. She's really worried about this and she says she's calling back on Monday if she doesn't hear anything.  Thank you

## 2015-07-08 NOTE — Telephone Encounter (Signed)
Pt has called back stating she just talked with insurance and they are saying we have not sent enough information to them.  There is a telephone note on 10/23/14 regarding the approval for this injection.  Not sure what to do from here.

## 2015-07-10 NOTE — Telephone Encounter (Signed)
Called patient, advised her that the balance is currently with insurance and under review. It is not a patient responsibility at this time. I will update her when we hear from [facility].

## 2015-07-16 DIAGNOSIS — M545 Low back pain: Secondary | ICD-10-CM | POA: Diagnosis not present

## 2015-07-17 ENCOUNTER — Encounter: Payer: Self-pay | Admitting: Internal Medicine

## 2015-07-18 NOTE — Telephone Encounter (Signed)
Letter of medical necessity for Prolia from Dr. Ronnald Ramp sent to billing.

## 2015-08-11 ENCOUNTER — Other Ambulatory Visit: Payer: Medicare Other

## 2015-08-14 ENCOUNTER — Telehealth: Payer: Self-pay

## 2015-08-14 NOTE — Telephone Encounter (Signed)
Left message advising patient to call back to schedule nurse visit for prolia injection---insurance has been verified and estimated copay of $200 for prolia injection will be due---see tamara if any questions

## 2015-08-18 ENCOUNTER — Other Ambulatory Visit (INDEPENDENT_AMBULATORY_CARE_PROVIDER_SITE_OTHER): Payer: Medicare Other | Admitting: *Deleted

## 2015-08-18 DIAGNOSIS — E785 Hyperlipidemia, unspecified: Secondary | ICD-10-CM

## 2015-08-18 LAB — HEPATIC FUNCTION PANEL
ALT: 15 U/L (ref 6–29)
AST: 19 U/L (ref 10–35)
Albumin: 4.1 g/dL (ref 3.6–5.1)
Alkaline Phosphatase: 71 U/L (ref 33–130)
BILIRUBIN DIRECT: 0.1 mg/dL (ref <=0.2)
Indirect Bilirubin: 0.6 mg/dL (ref 0.2–1.2)
Total Bilirubin: 0.7 mg/dL (ref 0.2–1.2)
Total Protein: 6.6 g/dL (ref 6.1–8.1)

## 2015-08-18 LAB — LIPID PANEL
CHOL/HDL RATIO: 2.3 ratio (ref <=5.0)
CHOLESTEROL: 191 mg/dL (ref 125–200)
HDL: 84 mg/dL (ref >=46)
LDL Cholesterol: 85 mg/dL (ref <130)
Triglycerides: 112 mg/dL (ref <150)
VLDL: 22 mg/dL (ref <30)

## 2015-08-21 ENCOUNTER — Ambulatory Visit (INDEPENDENT_AMBULATORY_CARE_PROVIDER_SITE_OTHER): Payer: Medicare Other

## 2015-08-21 DIAGNOSIS — M810 Age-related osteoporosis without current pathological fracture: Secondary | ICD-10-CM | POA: Diagnosis not present

## 2015-08-21 MED ORDER — DENOSUMAB 60 MG/ML ~~LOC~~ SOLN
60.0000 mg | Freq: Once | SUBCUTANEOUS | Status: AC
  Administered 2015-08-21: 60 mg via SUBCUTANEOUS

## 2015-08-28 ENCOUNTER — Ambulatory Visit (INDEPENDENT_AMBULATORY_CARE_PROVIDER_SITE_OTHER): Payer: Medicare Other | Admitting: Family

## 2015-08-28 ENCOUNTER — Encounter: Payer: Self-pay | Admitting: Family

## 2015-08-28 VITALS — BP 120/70 | HR 75 | Temp 98.2°F | Ht 62.0 in | Wt 132.0 lb

## 2015-08-28 DIAGNOSIS — R21 Rash and other nonspecific skin eruption: Secondary | ICD-10-CM | POA: Diagnosis not present

## 2015-08-28 DIAGNOSIS — T148 Other injury of unspecified body region: Secondary | ICD-10-CM

## 2015-08-28 DIAGNOSIS — W57XXXA Bitten or stung by nonvenomous insect and other nonvenomous arthropods, initial encounter: Secondary | ICD-10-CM

## 2015-08-28 MED ORDER — DOXYCYCLINE HYCLATE 100 MG PO TABS: ORAL_TABLET | ORAL | Status: DC

## 2015-08-28 NOTE — Progress Notes (Signed)
Pre visit review using our clinic review tool, if applicable. No additional management support is needed unless otherwise documented below in the visit note. 

## 2015-08-28 NOTE — Progress Notes (Signed)
Subjective:    Patient ID: Carrie [name], female    DOB: March 26, 1946, 70 y.o.   MRN: [id number]   DEZARE LABELLA is a 70 y.o. female who presents today for an acute visit.    HPI Comments: Patient here for evaluation of rash for 3 days after cleaning in the yard. Describes rash as itchy. Tick bite on dorsal aspect of left hand which she removed. Has been taking benadryl with some relief.  Denies fever, chills, unusual joint pain.   Known murmur.   Past Medical History  Diagnosis Date  . Other specified cardiac dysrhythmias(427.89)   . Undiagnosed cardiac murmurs   . Osteoporosis, unspecified   . Other and unspecified hyperlipidemia   . Coronary artery disease   . Contact lens/glasses fitting     contacts or gl;asses  . Wears hearing aid     left   Allergies: Codeine and Crestor Current Outpatient Prescriptions on File Prior to Visit  Medication Sig Dispense Refill  . aspirin 81 MG tablet Take 81 mg by mouth daily.      Marland Kitchen azelastine (ASTELIN) 0.1 % nasal spray Place 2 sprays into both nostrils 2 (two) times daily. Use in each nostril as directed 30 mL 11  . azithromycin (ZITHROMAX) 500 MG tablet Take 1 tablet (500 mg total) by mouth daily. 3 tablet 0  . CALCIUM PO Take by mouth daily.    . Cholecalciferol (VITAMIN D3) 1000 UNITS CAPS Take 1 capsule by mouth daily.      . Coenzyme Q10 200 MG TABS Take 1 tablet (200 mg total) by mouth daily.  0  . ezetimibe (ZETIA) 10 MG tablet Take 1 tablet (10 mg total) by mouth daily. 30 tablet 3  . fish oil-omega-3 fatty acids 1000 MG capsule Take 1 g by mouth daily.      . nitroGLYCERIN (NITROLINGUAL) 0.4 MG/SPRAY spray Place 1 spray under the tongue as directed.      . rosuvastatin (CRESTOR) 40 MG tablet Take 1 tablet (40 mg total) by mouth daily. 30 tablet 3  . solifenacin (VESICARE) 5 MG tablet Take 1 tablet (5 mg total) by mouth daily. (Patient not taking: Reported on 04/22/2015) 30 tablet 11  . Thiamine HCl (VITAMIN B-1) 250 MG  tablet Take 500 mg by mouth daily.      Marland Kitchen triamcinolone (KENALOG) 0.025 % ointment Apply 1 application topically 2 (two) times daily. 30 g 0   No current facility-administered medications on file prior to visit.    Social History  Substance Use Topics  . Smoking status: Former Smoker    Quit date: 04/05/1999  . Smokeless tobacco: Never Used     Comment: Regular Exercise-No  . Alcohol Use: 1.8 oz/week    3 Glasses of wine per week    Review of Systems    Objective:    BP 120/70 mmHg  Pulse 75  Temp(Src) 98.2 F (36.8 C) (Oral)  Ht 5\' 2"  (1.575 m)  Wt 132 lb (59.875 kg)  BMI 24.14 kg/m2  SpO2 95%   Physical Exam  Constitutional: She appears well-developed and well-nourished.  Eyes: Conjunctivae are normal.  Cardiovascular: Normal rate, regular rhythm and normal pulses.   Murmur heard.  Systolic murmur is present with a grade of 3/6  SEM III/VI, Loudest LSB, non radiating, no thrill   Pulmonary/Chest: Effort normal and breath sounds normal. She has no wheezes. She has no rhonchi. She has no rales.  Neurological: She is alert.  Skin: Skin  is warm and dry.  Several discrete, scabbed over papules noted lower extremities and medial aspect of forearms. 3 papules noted right cheek on face. No erythema, bulls eye, drainage, blisters.   Scab on right dorsal aspect of hand where tick was pulled off. Unable to appreciate any remains tick.  Psychiatric: She has a normal mood and affect. Her speech is normal and behavior is normal. Thought content normal.  Vitals reviewed.      Assessment & Plan:   1. Rash and nonspecific skin eruption Working diagnosis of insect bites. Trial of topical steroid, calamine lotion.    2. Tick bite Patient preferred to be treated empirically for Lyme disease.  - doxycycline (VIBRA-TABS) 100 MG tablet; Take two tablets by mouth ( total of 200 mg) once for lyme prophylaxis.  Dispense: 2 tablet; Refill: 0   I am having Ms. Pau maintain her  aspirin, fish oil-omega-3 fatty acids, nitroGLYCERIN, vitamin B-1, Vitamin D3, CALCIUM PO, Coenzyme Q10, ezetimibe, solifenacin, triamcinolone, rosuvastatin, azelastine, and azithromycin.   No orders of the defined types were placed in this encounter.     Start medications as prescribed and explained to patient on After Visit Summary ( AVS). Risks, benefits, and alternatives of the medications and treatment plan prescribed today were discussed, and patient expressed understanding.   Education regarding symptom management and diagnosis given to patient.   Follow-up:Plan follow-up as discussed or as needed if any worsening symptoms or change in condition. No Follow-up on file.   Continue to follow with Scarlette Calico, MD for routine health maintenance.   Carrie [name] and I agreed with plan.   Mable Paris, FNP

## 2015-08-28 NOTE — Patient Instructions (Signed)
Suspect chiggers or insect bites.   Calamine lotion. Triamcinolone cream. One time dose of doxycycline.   If there is no improvement in your symptoms, or if there is any worsening of symptoms, or if you have any additional concerns, please return for re-evaluation; or, if we are closed, consider going to the Emergency Room for evaluation if symptoms urgent.  Tick Bite Information Ticks are insects that attach themselves to the skin and draw blood for food. There are various types of ticks. Common types include wood ticks and deer ticks. Most ticks live in shrubs and grassy areas. Ticks can climb onto your body when you make contact with leaves or grass where the tick is waiting. The most common places on the body for ticks to attach themselves are the scalp, neck, armpits, waist, and groin. Most tick bites are harmless, but sometimes ticks carry germs that cause diseases. These germs can be spread to a person during the tick's feeding process. The chance of a disease spreading through a tick bite depends on:   The type of tick.  Time of year.   How long the tick is attached.   Geographic location.  HOW CAN YOU PREVENT TICK BITES? Take these steps to help prevent tick bites when you are outdoors:  Wear protective clothing. Long sleeves and long pants are best.   Wear white clothes so you can see ticks more easily.  Tuck your pant legs into your socks.   If walking on a trail, stay in the middle of the trail to avoid brushing against bushes.  Avoid walking through areas with long grass.  Put insect repellent on all exposed skin and along boot tops, pant legs, and sleeve cuffs.   Check clothing, hair, and skin repeatedly and before going inside.   Brush off any ticks that are not attached.  Take a shower or bath as soon as possible after being outdoors.  WHAT IS THE PROPER WAY TO REMOVE A TICK? Ticks should be removed as soon as possible to help prevent diseases caused by  tick bites. 1. If latex gloves are available, put them on before trying to remove a tick.  2. Using fine-point tweezers, grasp the tick as close to the skin as possible. You may also use curved forceps or a tick removal tool. Grasp the tick as close to its head as possible. Avoid grasping the tick on its body. 3. Pull gently with steady upward pressure until the tick lets go. Do not twist the tick or jerk it suddenly. This may break off the tick's head or mouth parts. 4. Do not squeeze or crush the tick's body. This could force disease-carrying fluids from the tick into your body.  5. After the tick is removed, wash the bite area and your hands with soap and water or other disinfectant such as alcohol. 6. Apply a small amount of antiseptic cream or ointment to the bite site.  7. Wash and disinfect any instruments that were used.  Do not try to remove a tick by applying a hot match, petroleum jelly, or fingernail polish to the tick. These methods do not work and may increase the chances of disease being spread from the tick bite.  WHEN SHOULD YOU SEEK MEDICAL CARE? Contact your health care provider if you are unable to remove a tick from your skin or if a part of the tick breaks off and is stuck in the skin.  After a tick bite, you need to be aware  of signs and symptoms that could be related to diseases spread by ticks. Contact your health care provider if you develop any of the following in the days or weeks after the tick bite:  Unexplained fever.  Rash. A circular rash that appears days or weeks after the tick bite may indicate the possibility of Lyme disease. The rash may resemble a target with a bull's-eye and may occur at a different part of your body than the tick bite.  Redness and swelling in the area of the tick bite.   Tender, swollen lymph glands.   Diarrhea.   Weight loss.   Cough.   Fatigue.   Muscle, joint, or bone pain.   Abdominal pain.   Headache.    Lethargy or a change in your level of consciousness.  Difficulty walking or moving your legs.   Numbness in the legs.   Paralysis.  Shortness of breath.   Confusion.   Repeated vomiting.    This information is not intended to replace advice given to you by your health care provider. Make sure you discuss any questions you have with your health care provider.   Document Released: 04/09/2000 Document Revised: 05/03/2014 Document Reviewed: 09/20/2012 Elsevier Interactive Patient Education [employer].

## 2015-09-01 DIAGNOSIS — L237 Allergic contact dermatitis due to plants, except food: Secondary | ICD-10-CM | POA: Diagnosis not present

## 2015-09-29 DIAGNOSIS — H2513 Age-related nuclear cataract, bilateral: Secondary | ICD-10-CM | POA: Diagnosis not present

## 2015-10-22 ENCOUNTER — Telehealth: Payer: Self-pay | Admitting: Internal Medicine

## 2015-10-22 NOTE — Telephone Encounter (Signed)
Pt left a msg on my vm stating she received a letter from her ins saying they did not approve her prolia and that we didn't give them enough information. Can you please call pt at [phone] or [phone]

## 2015-10-22 NOTE — Telephone Encounter (Signed)
Patient advised of all material faxed on her behalf to insurance co, including medical necessity letter from dr Moshe Cipro is assisting Carrie [name] with this, also---patient advised that we will call her back after we reach back out to her insurance co again (this letter from ins co is for an injection that was approved by her insurance co and injection given back in June,2016)

## 2015-11-21 ENCOUNTER — Encounter: Payer: Self-pay | Admitting: Family

## 2015-11-21 ENCOUNTER — Other Ambulatory Visit: Payer: Medicare Other

## 2015-11-21 ENCOUNTER — Ambulatory Visit (INDEPENDENT_AMBULATORY_CARE_PROVIDER_SITE_OTHER): Payer: Medicare Other | Admitting: Family

## 2015-11-21 VITALS — BP 152/82 | HR 75 | Resp 16 | Ht 62.0 in | Wt 133.0 lb

## 2015-11-21 DIAGNOSIS — R3915 Urgency of urination: Secondary | ICD-10-CM | POA: Insufficient documentation

## 2015-11-21 LAB — POCT URINALYSIS DIPSTICK
Bilirubin, UA: NEGATIVE
Blood, UA: NEGATIVE
GLUCOSE UA: NEGATIVE
Leukocytes, UA: NEGATIVE
Nitrite, UA: NEGATIVE
Protein, UA: NEGATIVE
UROBILINOGEN UA: NEGATIVE
pH, UA: 6

## 2015-11-21 MED ORDER — CIPROFLOXACIN HCL 500 MG PO TABS: 500.0000 mg | ORAL_TABLET | Freq: Two times a day (BID) | ORAL | 0 refills | Status: DC

## 2015-11-21 NOTE — Addendum Note (Signed)
Addended by: Delice Bison E on: 11/21/2015 03:27 PM   Modules accepted: Orders

## 2015-11-21 NOTE — Patient Instructions (Signed)
Thank you for choosing [facility].  Summary/Instructions:  Your prescription(s) have been submitted to your pharmacy or been printed and provided for you. Please take as directed and contact our office if you believe you are having problem(s) with the medication(s) or have any questions.  If your symptoms worsen or fail to improve, please contact our office for further instruction, or in case of emergency go directly to the emergency room at the closest medical facility.   Urinary Tract Infection Urinary tract infections (UTIs) can develop anywhere along your urinary tract. Your urinary tract is your body's drainage system for removing wastes and extra water. Your urinary tract includes two kidneys, two ureters, a bladder, and a urethra. Your kidneys are a pair of bean-shaped organs. Each kidney is about the size of your fist. They are located below your ribs, one on each side of your spine. CAUSES Infections are caused by microbes, which are microscopic organisms, including fungi, viruses, and bacteria. These organisms are so small that they can only be seen through a microscope. Bacteria are the microbes that most commonly cause UTIs. SYMPTOMS  Symptoms of UTIs may vary by age and gender of the patient and by the location of the infection. Symptoms in young women typically include a frequent and intense urge to urinate and a painful, burning feeling in the bladder or urethra during urination. Older women and men are more likely to be tired, shaky, and weak and have muscle aches and abdominal pain. A fever may mean the infection is in your kidneys. Other symptoms of a kidney infection include pain in your back or sides below the ribs, nausea, and vomiting. DIAGNOSIS To diagnose a UTI, your caregiver will ask you about your symptoms. Your caregiver will also ask you to provide a urine sample. The urine sample will be tested for bacteria and white blood cells. White blood cells are made by your  body to help fight infection. TREATMENT  Typically, UTIs can be treated with medication. Because most UTIs are caused by a bacterial infection, they usually can be treated with the use of antibiotics. The choice of antibiotic and length of treatment depend on your symptoms and the type of bacteria causing your infection. HOME CARE INSTRUCTIONS  If you were prescribed antibiotics, take them exactly as your caregiver instructs you. Finish the medication even if you feel better after you have only taken some of the medication.  Drink enough water and fluids to keep your urine clear or pale yellow.  Avoid caffeine, tea, and carbonated beverages. They tend to irritate your bladder.  Empty your bladder often. Avoid holding urine for long periods of time.  Empty your bladder before and after sexual intercourse.  After a bowel movement, women should cleanse from front to back. Use each tissue only once. SEEK MEDICAL CARE IF:   You have back pain.  You develop a fever.  Your symptoms do not begin to resolve within 3 days. SEEK IMMEDIATE MEDICAL CARE IF:   You have severe back pain or lower abdominal pain.  You develop chills.  You have nausea or vomiting.  You have continued burning or discomfort with urination. MAKE SURE YOU:   Understand these instructions.  Will watch your condition.  Will get help right away if you are not doing well or get worse.   This information is not intended to replace advice given to you by your health care provider. Make sure you discuss any questions you have with your health care   provider.   Document Released: 01/20/2005 Document Revised: 01/01/2015 Document Reviewed: 05/21/2011 Elsevier Interactive Patient Education 2016 Elsevier Inc.  

## 2015-11-21 NOTE — Progress Notes (Signed)
Subjective:    Patient ID: Carrie [name], female    DOB: [birth year], 70 y.o.   MRN: [id number]  Chief Complaint  Patient presents with  . Urinary Urgency    x1 week feeling bloated and urinary urgency    HPI:  Carrie [name] is a 70 y.o. female who  has a past medical history of Contact lens/glasses fitting; Coronary artery disease; Osteoporosis, unspecified; Other and unspecified hyperlipidemia; Other specified cardiac dysrhythmias(427.89); Undiagnosed cardiac murmurs; and Wears hearing aid. and presents today For an acute office visit.  This is a new problem. Associated symptom of feeling bloated, urinary urgency and urinary frequency have been going on for approximately one week. Denies fevers, flank pain, or dysuria.  Denies any constipation, nausea, or vomiting. Denies recent antibiotics.    Allergies  Allergen Reactions  . Codeine   . Crestor [Rosuvastatin] Other (See Comments)    Crestor 40mg  fatigue and exhaustion--pt tolerates crestor 20mg      Current Outpatient Prescriptions on File Prior to Visit  Medication Sig Dispense Refill  . aspirin 81 MG tablet Take 81 mg by mouth daily.      Marland Kitchen CALCIUM PO Take by mouth daily.    . Cholecalciferol (VITAMIN D3) 1000 UNITS CAPS Take 1 capsule by mouth daily.      . Coenzyme Q10 200 MG TABS Take 1 tablet (200 mg total) by mouth daily.  0  . ezetimibe (ZETIA) 10 MG tablet Take 1 tablet (10 mg total) by mouth daily. 30 tablet 3  . fish oil-omega-3 fatty acids 1000 MG capsule Take 1 g by mouth daily.      . nitroGLYCERIN (NITROLINGUAL) 0.4 MG/SPRAY spray Place 1 spray under the tongue as directed.      . rosuvastatin (CRESTOR) 40 MG tablet Take 1 tablet (40 mg total) by mouth daily. 30 tablet 3  . solifenacin (VESICARE) 5 MG tablet Take 1 tablet (5 mg total) by mouth daily. 30 tablet 11  . Thiamine HCl (VITAMIN B-1) 250 MG tablet Take 500 mg by mouth daily.      Marland Kitchen triamcinolone (KENALOG) 0.025 % ointment Apply 1 application  topically 2 (two) times daily. 30 g 0   No current facility-administered medications on file prior to visit.     Review of Systems  Constitutional: Negative for chills and fever.  Gastrointestinal: Negative for abdominal pain.  Genitourinary: Positive for frequency and urgency. Negative for hematuria.      Objective:    BP (!) 152/82 (BP Location: Left Arm, Patient Position: Sitting, Cuff Size: Normal)   Pulse 75   Resp 16   Ht 5\' 2"  (1.575 m)   Wt 133 lb (60.3 kg)   SpO2 97%   BMI 24.33 kg/m  Nursing note and vital signs reviewed.  Physical Exam  Constitutional: She is oriented to person, place, and time. She appears well-developed and well-nourished. No distress.  Cardiovascular: Normal rate, regular rhythm and intact distal pulses.  Exam reveals no gallop and no friction rub.   Murmur heard. Pulmonary/Chest: Effort normal and breath sounds normal. No respiratory distress. She has no wheezes. She has no rales.  Abdominal: Normal appearance and bowel sounds are normal. She exhibits no distension and no mass. There is no tenderness. There is no rigidity, no rebound, no guarding, no CVA tenderness, no tenderness at McBurney's point and negative Murphy's sign.  Neurological: She is alert and oriented to person, place, and time.  Skin: Skin is warm and dry.  Psychiatric: She  has a normal mood and affect. Her behavior is normal. Judgment and thought content normal.       Assessment & Plan:   Problem List Items Addressed This Visit      Other   Urinary urgency - Primary    In office urinalysis negative for nitrites, leukocytes, and hematuria. Urine sent for culture. Symptoms are consistent with urinary tract infection. Start ciprofloxacin. Encouraged to drink plenty of fluids. Follow-up if symptoms worsen or not improved.      Relevant Medications   ciprofloxacin (CIPRO) 500 MG tablet   Other Relevant Orders   POCT urinalysis dipstick (Completed)   Urine culture    Other  Visit Diagnoses   None.      I have discontinued Ms. Gheen's azelastine, azithromycin, and doxycycline. I am also having her start on ciprofloxacin. Additionally, I am having her maintain her aspirin, fish oil-omega-3 fatty acids, nitroGLYCERIN, vitamin B-1, Vitamin D3, CALCIUM PO, Coenzyme Q10, ezetimibe, solifenacin, triamcinolone, and rosuvastatin.   Meds ordered this encounter  Medications  . ciprofloxacin (CIPRO) 500 MG tablet    Sig: Take 1 tablet (500 mg total) by mouth 2 (two) times daily.    Dispense:  6 tablet    Refill:  0    Order Specific Question:   Supervising Provider    Answer:   Pricilla Holm A [id number]     Follow-up: Return if symptoms worsen or fail to improve.  Mauricio Po, FNP

## 2015-11-21 NOTE — Assessment & Plan Note (Addendum)
In office urinalysis negative for nitrites, leukocytes, and hematuria. Urine sent for culture. Symptoms are consistent with urinary tract infection. Start ciprofloxacin. Encouraged to drink plenty of fluids. Follow-up if symptoms worsen or not improved.

## 2015-11-23 LAB — URINE CULTURE

## 2015-12-09 ENCOUNTER — Ambulatory Visit (INDEPENDENT_AMBULATORY_CARE_PROVIDER_SITE_OTHER): Payer: Medicare Other | Admitting: Internal Medicine

## 2015-12-09 ENCOUNTER — Encounter: Payer: Self-pay | Admitting: Internal Medicine

## 2015-12-09 VITALS — BP 142/82 | HR 82 | Temp 98.6°F | Resp 16 | Wt 133.0 lb

## 2015-12-09 DIAGNOSIS — J069 Acute upper respiratory infection, unspecified: Secondary | ICD-10-CM | POA: Insufficient documentation

## 2015-12-09 DIAGNOSIS — N3281 Overactive bladder: Secondary | ICD-10-CM

## 2015-12-09 DIAGNOSIS — R3915 Urgency of urination: Secondary | ICD-10-CM

## 2015-12-09 DIAGNOSIS — R002 Palpitations: Secondary | ICD-10-CM | POA: Diagnosis not present

## 2015-12-09 DIAGNOSIS — B9789 Other viral agents as the cause of diseases classified elsewhere: Secondary | ICD-10-CM

## 2015-12-09 DIAGNOSIS — I350 Nonrheumatic aortic (valve) stenosis: Secondary | ICD-10-CM | POA: Diagnosis not present

## 2015-12-09 MED ORDER — PROMETHAZINE-DM 6.25-15 MG/5ML PO SYRP: 5.0000 mL | ORAL_SOLUTION | Freq: Four times a day (QID) | ORAL | 0 refills | Status: DC | PRN

## 2015-12-09 NOTE — Progress Notes (Signed)
Subjective:  Patient ID: Carrie [name], female    DOB: 10-18-1945  Age: 70 y.o. MRN: [id number]  CC: Palpitations and Cough   HPI Carrie [name] presents for concerns about a cough for 1 week and palpitations for 4 days.  She tells me that about a week ago she developed a cough, sore throat, and laryngitis. The cough has been productive of clear phlegm and is gradually improving.   She also tells me that 4 days ago she took a dose of Benadryl and then felt like her heart was racing. Her daughter who is a CMA checked her pulse and tells me was 146. Then 1 daily after that she had another episode of feeling like her heart was racing. When her HR was elevated she did not experience any chest pain, shortness of breath, diaphoresis, dizziness, lightheadedness, or near-syncope. For 2 days now she has had no recurrence of the tachycardia.  Outpatient Medications Prior to Visit  Medication Sig Dispense Refill  . aspirin 81 MG tablet Take 81 mg by mouth daily.      Marland Kitchen CALCIUM PO Take by mouth daily.    . Cholecalciferol (VITAMIN D3) 1000 UNITS CAPS Take 1 capsule by mouth daily.      . Coenzyme Q10 200 MG TABS Take 1 tablet (200 mg total) by mouth daily.  0  . ezetimibe (ZETIA) 10 MG tablet Take 1 tablet (10 mg total) by mouth daily. 30 tablet 3  . fish oil-omega-3 fatty acids 1000 MG capsule Take 1 g by mouth daily.      . nitroGLYCERIN (NITROLINGUAL) 0.4 MG/SPRAY spray Place 1 spray under the tongue as directed.      . rosuvastatin (CRESTOR) 40 MG tablet Take 1 tablet (40 mg total) by mouth daily. 30 tablet 3  . solifenacin (VESICARE) 5 MG tablet Take 1 tablet (5 mg total) by mouth daily. 30 tablet 11  . Thiamine HCl (VITAMIN B-1) 250 MG tablet Take 500 mg by mouth daily.      Marland Kitchen triamcinolone (KENALOG) 0.025 % ointment Apply 1 application topically 2 (two) times daily. 30 g 0  . ciprofloxacin (CIPRO) 500 MG tablet Take 1 tablet (500 mg total) by mouth 2 (two) times daily. 6 tablet 0   No  facility-administered medications prior to visit.     ROS Review of Systems  Constitutional: Negative.  Negative for activity change, appetite change, chills, diaphoresis, fatigue, fever and unexpected weight change.  HENT: Positive for voice change. Negative for sore throat and trouble swallowing.   Eyes: Negative.   Respiratory: Positive for cough.   Cardiovascular: Positive for palpitations.  Gastrointestinal: Negative for abdominal pain, diarrhea, nausea and vomiting.  Endocrine: Negative.   Genitourinary: Positive for frequency and urgency. Negative for decreased urine volume, difficulty urinating and dysuria.       She was recently seen for urinary symptoms and tells me her urinalysis was negative for a urinary tract infection but she was treated with a course of Cipro. She was diagnosed with overactive bladder and has tried [employer] and has not had much relief from her symptoms. She wants to be referred to a urologist.  Musculoskeletal: Negative.  Negative for back pain and neck pain.  Skin: Negative.   Allergic/Immunologic: Negative.   Neurological: Negative.  Negative for dizziness, tremors, weakness, light-headedness, numbness and headaches.  Hematological: Negative.  Negative for adenopathy. Does not bruise/bleed easily.    Objective:  BP (!) 142/82 (BP Location: Left Arm)   Pulse  82   Temp 98.6 F (37 C) (Oral)   Resp 16   Wt 133 lb (60.3 kg)   SpO2 97%   BMI 24.33 kg/m   BP Readings from Last 3 Encounters:  12/09/15 (!) 142/82  11/21/15 (!) 152/82  08/28/15 120/70    Wt Readings from Last 3 Encounters:  12/09/15 133 lb (60.3 kg)  11/21/15 133 lb (60.3 kg)  08/28/15 132 lb (59.9 kg)    Physical Exam  Constitutional: She is oriented to person, place, and time.  Non-toxic appearance. She does not have a sickly appearance. She does not appear ill. No distress.  HENT:  Mouth/Throat: Oropharynx is clear and moist. No oropharyngeal exudate.  Eyes: Conjunctivae  are normal. Left eye exhibits no discharge. No scleral icterus.  Neck: Normal range of motion. Neck supple. No JVD present. No tracheal deviation present. No thyromegaly present.  Cardiovascular: Normal rate, regular rhythm, S1 normal, S2 normal and intact distal pulses.  Exam reveals no gallop, no S3 and no S4.   Murmur heard.  Decrescendo systolic murmur is present with a grade of 2/6  EKG ----  Sinus  Rhythm  -Left atrial enlargement.   -Nonspecific ST depression  -Nondiagnostic.   ABNORMAL - no changed from prior EKG  Pulmonary/Chest: Effort normal and breath sounds normal. No stridor. No respiratory distress. She has no wheezes. She has no rales. She exhibits no tenderness.  Abdominal: Soft. Bowel sounds are normal. She exhibits no distension and no mass. There is no tenderness. There is no rebound and no guarding.  Musculoskeletal: Normal range of motion. She exhibits no edema, tenderness or deformity.  Lymphadenopathy:    She has no cervical adenopathy.  Neurological: She is oriented to person, place, and time.  Skin: Skin is warm and dry. No rash noted. She is not diaphoretic. No erythema. No pallor.  Vitals reviewed.   Lab Results  Component Value Date   WBC 8.3 06/22/2012   HGB 14.1 06/22/2012   HCT 41.3 06/22/2012   PLT 312.0 06/22/2012   GLUCOSE 89 07/22/2014   CHOL 191 08/18/2015   TRIG 112 08/18/2015   HDL 84 08/18/2015   LDLDIRECT 133.8 12/19/2012   LDLCALC 85 08/18/2015   ALT 15 08/18/2015   AST 19 08/18/2015   NA 137 07/22/2014   K 4.4 07/22/2014   CL 104 07/22/2014   CREATININE 0.65 07/22/2014   BUN 16 07/22/2014   CO2 24 07/22/2014   TSH 0.82 06/22/2012    Dg Chest 2 View  Result Date: 05/27/2015 CLINICAL DATA:  Cough and congestion for 7 days EXAM: CHEST  2 VIEW COMPARISON:  10/05/2013 FINDINGS: Cardiac shadow is stable. Aortic calcifications are again seen. Lungs are well aerated bilaterally. No focal infiltrate is seen. A mid thoracic compression  deformity is again noted and stable. IMPRESSION: No acute abnormality seen. Electronically Signed   By: Inez Catalina M.D.   On: 05/27/2015 14:51    Assessment & Plan:   Miarose was seen today for palpitations and cough.  Diagnoses and all orders for this visit:  OAB (overactive bladder) -     Ambulatory referral to Urology  Urinary urgency -     Ambulatory referral to Urology  Aortic stenosis -     Ambulatory referral to Cardiology  Palpitations- her heart rate is normal today, her description of palpitations sounds benign but she does have a prior history of cardiac ablation so I have asked her to undergo a cardiac event monitor to screen  for significant dysrhythmia. She also has a history of aortic stenosis of asked her to follow-up with her cardiologist within the next few weeks. -     Cardiac event monitor; Future -     Ambulatory referral to Cardiology -     EKG 12-Lead   I have discontinued Ms. Barre's ciprofloxacin. I am also having her start on promethazine-dextromethorphan. Additionally, I am having her maintain her aspirin, fish oil-omega-3 fatty acids, nitroGLYCERIN, vitamin B-1, Vitamin D3, CALCIUM PO, Coenzyme Q10, ezetimibe, solifenacin, triamcinolone, and rosuvastatin.  Meds ordered this encounter  Medications  . promethazine-dextromethorphan (PROMETHAZINE-DM) 6.25-15 MG/5ML syrup    Sig: Take 5 mLs by mouth 4 (four) times daily as needed for cough.    Dispense:  118 mL    Refill:  0     Follow-up: Return in about 4 weeks (around 01/06/2016).  Scarlette Calico, MD

## 2015-12-09 NOTE — Assessment & Plan Note (Signed)
I will treat this symptomatically with Phenergan DM

## 2015-12-09 NOTE — Progress Notes (Signed)
Pre visit review using our clinic review tool, if applicable. No additional management support is needed unless otherwise documented below in the visit note. 

## 2015-12-09 NOTE — Patient Instructions (Signed)

## 2015-12-17 ENCOUNTER — Ambulatory Visit (INDEPENDENT_AMBULATORY_CARE_PROVIDER_SITE_OTHER): Payer: Medicare Other

## 2015-12-17 DIAGNOSIS — R002 Palpitations: Secondary | ICD-10-CM | POA: Diagnosis not present

## 2016-01-20 ENCOUNTER — Encounter: Payer: Self-pay | Admitting: Internal Medicine

## 2016-01-20 ENCOUNTER — Ambulatory Visit (INDEPENDENT_AMBULATORY_CARE_PROVIDER_SITE_OTHER): Payer: Medicare Other | Admitting: Internal Medicine

## 2016-01-20 ENCOUNTER — Other Ambulatory Visit (INDEPENDENT_AMBULATORY_CARE_PROVIDER_SITE_OTHER): Payer: Medicare Other

## 2016-01-20 VITALS — BP 130/78 | HR 79 | Temp 98.2°F | Resp 16 | Ht 62.0 in | Wt 136.2 lb

## 2016-01-20 DIAGNOSIS — I350 Nonrheumatic aortic (valve) stenosis: Secondary | ICD-10-CM | POA: Diagnosis not present

## 2016-01-20 DIAGNOSIS — Z23 Encounter for immunization: Secondary | ICD-10-CM

## 2016-01-20 DIAGNOSIS — R002 Palpitations: Secondary | ICD-10-CM | POA: Diagnosis not present

## 2016-01-20 LAB — COMPREHENSIVE METABOLIC PANEL
ALT: 16 U/L (ref 0–35)
AST: 19 U/L (ref 0–37)
Albumin: 4 g/dL (ref 3.5–5.2)
Alkaline Phosphatase: 50 U/L (ref 39–117)
BUN: 15 mg/dL (ref 6–23)
CALCIUM: 9.2 mg/dL (ref 8.4–10.5)
CHLORIDE: 106 meq/L (ref 96–112)
CO2: 32 meq/L (ref 19–32)
Creatinine, Ser: 0.54 mg/dL (ref 0.40–1.20)
GFR: 118.38 mL/min (ref >60.00)
GLUCOSE: 87 mg/dL (ref 70–99)
POTASSIUM: 4.4 meq/L (ref 3.5–5.1)
Sodium: 143 mEq/L (ref 135–145)
Total Bilirubin: 0.5 mg/dL (ref 0.2–1.2)
Total Protein: 7.2 g/dL (ref 6.0–8.3)

## 2016-01-20 LAB — CBC WITH DIFFERENTIAL/PLATELET
BASOS ABS: 0 10*3/uL (ref 0.0–0.1)
BASOS PCT: 0.5 % (ref 0.0–3.0)
EOS PCT: 1.4 % (ref 0.0–5.0)
Eosinophils Absolute: 0.1 10*3/uL (ref 0.0–0.7)
HEMATOCRIT: 41 % (ref 36.0–46.0)
Hemoglobin: 14.2 g/dL (ref 12.0–15.0)
LYMPHS ABS: 2.1 10*3/uL (ref 0.7–4.0)
LYMPHS PCT: 27.8 % (ref 12.0–46.0)
MCHC: 34.6 g/dL (ref 30.0–36.0)
MCV: 89.3 fl (ref 78.0–100.0)
MONOS PCT: 6.7 % (ref 3.0–12.0)
Monocytes Absolute: 0.5 10*3/uL (ref 0.1–1.0)
NEUTROS ABS: 4.8 10*3/uL (ref 1.4–7.7)
Neutrophils Relative %: 63.6 % (ref 43.0–77.0)
PLATELETS: 317 10*3/uL (ref 150.0–400.0)
RBC: 4.59 Mil/uL (ref 3.87–5.11)
RDW: 13.1 % (ref 11.5–15.5)
WBC: 7.6 10*3/uL (ref 4.0–10.5)

## 2016-01-20 NOTE — Patient Instructions (Signed)

## 2016-01-20 NOTE — Progress Notes (Signed)
Subjective:  Patient ID: Carrie [name], female    DOB: [birth year]  Age: 70 y.o. MRN: [id number]  CC: Palpitations   HPI Carrie [name] presents for follow-up on palpitations which she tells me persist. She recently had a Holter monitor performed that documented PACs and PVCs during episodes of dizziness. She tells me most of her palpitations occur while she was at rest. She complains of fatigue but denies lightheadedness, near syncope, syncope, chest pain, shortness of breath, or edema.  Outpatient Medications Prior to Visit  Medication Sig Dispense Refill  . aspirin 81 MG tablet Take 81 mg by mouth daily.      Marland Kitchen CALCIUM PO Take by mouth daily.    . Cholecalciferol (VITAMIN D3) 1000 UNITS CAPS Take 1 capsule by mouth daily.      . Coenzyme Q10 200 MG TABS Take 1 tablet (200 mg total) by mouth daily.  0  . ezetimibe (ZETIA) 10 MG tablet Take 1 tablet (10 mg total) by mouth daily. 30 tablet 3  . fish oil-omega-3 fatty acids 1000 MG capsule Take 1 g by mouth daily.      . rosuvastatin (CRESTOR) 40 MG tablet Take 1 tablet (40 mg total) by mouth daily. 30 tablet 3  . solifenacin (VESICARE) 5 MG tablet Take 1 tablet (5 mg total) by mouth daily. 30 tablet 11  . Thiamine HCl (VITAMIN B-1) 250 MG tablet Take 500 mg by mouth daily.      Marland Kitchen triamcinolone (KENALOG) 0.025 % ointment Apply 1 application topically 2 (two) times daily. 30 g 0  . nitroGLYCERIN (NITROLINGUAL) 0.4 MG/SPRAY spray Place 1 spray under the tongue as directed.      . promethazine-dextromethorphan (PROMETHAZINE-DM) 6.25-15 MG/5ML syrup Take 5 mLs by mouth 4 (four) times daily as needed for cough. 118 mL 0   No facility-administered medications prior to visit.     ROS Review of Systems  Constitutional: Positive for fatigue. Negative for activity change, appetite change, diaphoresis and unexpected weight change.  HENT: Negative.   Eyes: Negative.  Negative for visual disturbance.  Respiratory: Negative.  Negative for  cough, choking, chest tightness, shortness of breath and stridor.   Cardiovascular: Positive for palpitations. Negative for chest pain and leg swelling.  Gastrointestinal: Negative.  Negative for abdominal pain, diarrhea and nausea.  Endocrine: Negative.   Genitourinary: Negative.  Negative for difficulty urinating.  Musculoskeletal: Negative.   Skin: Negative.   Allergic/Immunologic: Negative.   Neurological: Positive for dizziness. Negative for syncope, weakness, light-headedness and numbness.  Hematological: Negative.  Negative for adenopathy. Does not bruise/bleed easily.  Psychiatric/Behavioral: Negative.     Objective:  BP 130/78 (BP Location: Left Arm, Patient Position: Sitting, Cuff Size: Normal)   Pulse 79   Temp 98.2 F (36.8 C) (Oral)   Resp 16   Ht 5\' 2"  (1.575 m)   Wt 136 lb 4 oz (61.8 kg)   SpO2 95%   BMI 24.92 kg/m   BP Readings from Last 3 Encounters:  01/20/16 130/78  12/09/15 (!) 142/82  11/21/15 (!) 152/82    Wt Readings from Last 3 Encounters:  01/20/16 136 lb 4 oz (61.8 kg)  12/09/15 133 lb (60.3 kg)  11/21/15 133 lb (60.3 kg)    Physical Exam  Constitutional: She is oriented to person, place, and time. No distress.  HENT:  Mouth/Throat: Oropharynx is clear and moist. No oropharyngeal exudate.  Eyes: Conjunctivae are normal. Right eye exhibits no discharge. Left eye exhibits no discharge. No scleral  icterus.  Neck: Normal range of motion. Neck supple. No JVD present. No tracheal deviation present. No thyromegaly present.  Cardiovascular: Normal rate, regular rhythm, S1 normal, S2 normal and intact distal pulses.  Exam reveals no gallop and no friction rub.   Murmur heard.  Systolic murmur is present with a grade of 3/6  Pulmonary/Chest: Effort normal and breath sounds normal. No stridor. No respiratory distress. She has no wheezes. She has no rales. She exhibits no tenderness.  Abdominal: Soft. Bowel sounds are normal. She exhibits no distension and  no mass. There is no tenderness. There is no rebound and no guarding.  Musculoskeletal: Normal range of motion. She exhibits no edema, tenderness or deformity.  Lymphadenopathy:    She has no cervical adenopathy.  Neurological: She is oriented to person, place, and time.  Skin: Skin is warm and dry. No rash noted. She is not diaphoretic. No erythema.  Vitals reviewed.   Lab Results  Component Value Date   WBC 7.6 01/20/2016   HGB 14.2 01/20/2016   HCT 41.0 01/20/2016   PLT 317.0 01/20/2016   GLUCOSE 87 01/20/2016   CHOL 191 08/18/2015   TRIG 112 08/18/2015   HDL 84 08/18/2015   LDLDIRECT 133.8 12/19/2012   LDLCALC 85 08/18/2015   ALT 16 01/20/2016   AST 19 01/20/2016   NA 143 01/20/2016   K 4.4 01/20/2016   CL 106 01/20/2016   CREATININE 0.54 01/20/2016   BUN 15 01/20/2016   CO2 32 01/20/2016   TSH 0.82 06/22/2012    Dg Chest 2 View  Result Date: 05/27/2015 CLINICAL DATA:  Cough and congestion for 7 days EXAM: CHEST  2 VIEW COMPARISON:  10/05/2013 FINDINGS: Cardiac shadow is stable. Aortic calcifications are again seen. Lungs are well aerated bilaterally. No focal infiltrate is seen. A mid thoracic compression deformity is again noted and stable. IMPRESSION: No acute abnormality seen. Electronically Signed   By: Inez Catalina M.D.   On: 05/27/2015 14:51    Assessment & Plan:   Carrie [name] was seen today for palpitations.  Diagnoses and all orders for this visit:  Need for prophylactic vaccination and inoculation against influenza -     Flu vaccine HIGH DOSE PF (Fluzone High dose)  Palpitations-  Her recent cardiac monitor revealed PVCs and PACs which appear to be benign, however she is mildly symptomatic and has a history of aortic stenosis. Her labs today show no secondary metabolic or electrolyte causes to explain her symptoms. I have asked her to see cardiology for further evaluation. -     Ambulatory referral to Cardiology -     Comprehensive metabolic panel; Future -      CBC with Differential/Platelet; Future -     Thyroid Panel With TSH; Future  Aortic stenosis- I'm concerned her symptoms may be related to aortic stenosis so I encouraged her to have a follow-up with cardiology within the next few months. -     Ambulatory referral to Cardiology   I have discontinued Carrie [name]'s promethazine-dextromethorphan. I am also having her maintain her aspirin, fish oil-omega-3 fatty acids, nitroGLYCERIN, vitamin B-1, Vitamin D3, CALCIUM PO, Coenzyme Q10, ezetimibe, solifenacin, triamcinolone, and rosuvastatin.  No orders of the defined types were placed in this encounter.    Follow-up: Return in about 6 months (around 07/19/2016).  Scarlette Calico, MD

## 2016-01-20 NOTE — Progress Notes (Signed)
3 Pre visit review using our clinic review tool, if applicable. No additional management support is needed unless otherwise documented below in the visit note.

## 2016-01-21 ENCOUNTER — Encounter: Payer: Self-pay | Admitting: Internal Medicine

## 2016-01-21 LAB — THYROID PANEL WITH TSH
Free Thyroxine Index: 2.3 (ref 1.4–3.8)
T3 UPTAKE: 27 % (ref 22–35)
T4, Total: 8.7 ug/dL (ref 4.5–12.0)
TSH: 0.79 mIU/L

## 2016-01-27 ENCOUNTER — Telehealth: Payer: Self-pay | Admitting: *Deleted

## 2016-01-27 DIAGNOSIS — I350 Nonrheumatic aortic (valve) stenosis: Secondary | ICD-10-CM

## 2016-01-27 NOTE — Telephone Encounter (Signed)
LMTCB

## 2016-01-27 NOTE — Telephone Encounter (Signed)
Pt advised, verbalized understanding. Pt has appt already scheduled with Dr Aundra Dubin 02/11/16. Pt advised I will forward to [facility] to contact pt to schedule echo prior to 02/11/16 appt with Dr Aundra Dubin.

## 2016-01-27 NOTE — Telephone Encounter (Signed)
-----   Message from Larey Dresser, MD sent at 01/25/2016  9:57 AM EDT ----- Monitor showed runs of atrial flutter.  She will need anticoagulation likely.  She also needs repeat echo for AS.  Will need followup appt.

## 2016-02-02 ENCOUNTER — Telehealth: Payer: Self-pay | Admitting: Cardiology

## 2016-02-02 NOTE — Telephone Encounter (Signed)
Pt denies lightheadedness or dizziness or other symptoms when she has felt faster heart rate. Pt states she is asymptomatic at this time. Pt states she is avoiding caffeine. Pt currently scheduled for an echo 10/17 and follow up with Dr Aundra Dubin 10/18. Pt advised I will forward to Dr Aundra Dubin for review.

## 2016-02-02 NOTE — Telephone Encounter (Signed)
Would have her wear 48 hour holter to look for atrial fibrillation

## 2016-02-02 NOTE — Telephone Encounter (Signed)
New message   Patient c/o Palpitations:  High priority if patient c/o lightheadedness and shortness of breath.  1. How long have you been having palpitations? 2 or 3 days  2. Are you currently experiencing lightheadedness and shortness of breath? no  3. Have you checked your BP and heart rate? (document readings) hr over 104  And  111 4. Are you experiencing any other symptoms? no

## 2016-02-02 NOTE — Telephone Encounter (Signed)
Pt states in the last few days she has noticed a fast heart rate a couple of times, once 104 and 111, BP 124/80, both times lasted less than 10 minutes.

## 2016-02-03 NOTE — Telephone Encounter (Signed)
Suspect symptoms are likely atrial flutter.  She will need anticoagulation.  Can discuss at her appt next week.

## 2016-02-03 NOTE — Telephone Encounter (Signed)
Dr Aundra Dubin- She had event monitor placed 12/17/15-notes are copied from event monitor results:  Notes Recorded by [name], MD on 01/25/2016 at 12:26 PM EDT Please let her know that the final result on her heart monitor came back. There was an episode of atrial flutter but her heart rate was only 100. This is a significant finding and I want to make sure that she sees her cardiologist as soon as possible and that she mentions this to them.  She is scheduled for echo 10/17 and appt with you 10/18.

## 2016-02-04 NOTE — Telephone Encounter (Signed)
I spoke with pt, advised her of recommendation. She voiced understanding and agreed with plan.

## 2016-02-04 NOTE — Telephone Encounter (Signed)
Left message for patient to call back  

## 2016-02-06 DIAGNOSIS — N3281 Overactive bladder: Secondary | ICD-10-CM | POA: Diagnosis not present

## 2016-02-10 ENCOUNTER — Other Ambulatory Visit: Payer: Self-pay

## 2016-02-10 ENCOUNTER — Encounter: Payer: Self-pay | Admitting: Cardiology

## 2016-02-10 ENCOUNTER — Ambulatory Visit (HOSPITAL_COMMUNITY): Payer: Medicare Other | Attending: Cardiology

## 2016-02-10 DIAGNOSIS — I517 Cardiomegaly: Secondary | ICD-10-CM | POA: Diagnosis not present

## 2016-02-10 DIAGNOSIS — I340 Nonrheumatic mitral (valve) insufficiency: Secondary | ICD-10-CM | POA: Insufficient documentation

## 2016-02-10 DIAGNOSIS — I350 Nonrheumatic aortic (valve) stenosis: Secondary | ICD-10-CM | POA: Insufficient documentation

## 2016-02-10 DIAGNOSIS — I352 Nonrheumatic aortic (valve) stenosis with insufficiency: Secondary | ICD-10-CM | POA: Insufficient documentation

## 2016-02-10 DIAGNOSIS — E785 Hyperlipidemia, unspecified: Secondary | ICD-10-CM | POA: Diagnosis not present

## 2016-02-11 ENCOUNTER — Ambulatory Visit (INDEPENDENT_AMBULATORY_CARE_PROVIDER_SITE_OTHER): Payer: Medicare Other | Admitting: Cardiology

## 2016-02-11 ENCOUNTER — Encounter: Payer: Self-pay | Admitting: Cardiology

## 2016-02-11 VITALS — BP 122/64 | HR 92 | Ht 62.0 in | Wt 136.8 lb

## 2016-02-11 DIAGNOSIS — E7800 Pure hypercholesterolemia, unspecified: Secondary | ICD-10-CM | POA: Diagnosis not present

## 2016-02-11 DIAGNOSIS — I350 Nonrheumatic aortic (valve) stenosis: Secondary | ICD-10-CM | POA: Diagnosis not present

## 2016-02-11 DIAGNOSIS — I4892 Unspecified atrial flutter: Secondary | ICD-10-CM | POA: Diagnosis not present

## 2016-02-11 DIAGNOSIS — I2510 Atherosclerotic heart disease of native coronary artery without angina pectoris: Secondary | ICD-10-CM

## 2016-02-11 MED ORDER — APIXABAN 5 MG PO TABS: 5.0000 mg | ORAL_TABLET | Freq: Two times a day (BID) | ORAL | 1 refills | Status: DC

## 2016-02-11 MED ORDER — METOPROLOL SUCCINATE ER 25 MG PO TB24: 25.0000 mg | ORAL_TABLET | Freq: Every day | ORAL | 1 refills | Status: DC

## 2016-02-11 NOTE — Patient Instructions (Signed)
Medication Instructions:  Stop aspirin.  Start Eliquis 5mg  two times a day.  Start Toprol XL (metoprolol succinate) 25mg  daily.  Labwork: None   Testing/Procedures: None   Follow-Up: Your physician recommends that you schedule a follow-up appointment in:  [facility] in 1 month since you are starting Eliquis.  Your physician recommends that you schedule a follow-up appointment in: 4 months with Dr End.         If you need a refill on your cardiac medications before your next appointment, please call your pharmacy.

## 2016-02-12 ENCOUNTER — Telehealth: Payer: Self-pay

## 2016-02-12 NOTE — Telephone Encounter (Signed)
Prior auth for Eliquis 5mg  obtained from [facility]. [id number]. Local pharmacy notified.

## 2016-02-13 NOTE — Progress Notes (Signed)
Patient ID: Carrie [name], female   DOB: 1946/03/24, 70 y.o.   MRN: [id number] PCP: Dr. Ronnald Ramp  70 yo with history of nonobstructive CAD, SVT s/p ablation, and moderate aortic stenosis presents for cardiology followup.  Recently, she had an episode of palpitations with HR up to 140s at home.  She wore a 30 day monitor and was noted to have 1 episode of atrial flutter with RVR.  Echo in 10/17 showed normal EF, moderate AS.  No snoring or significant daytime sleepiness.  No exertional dyspnea, no chest pain.  Weight is stable.  No BRBPR or melena.   Labs (5/12): LDL 151, HDL 74, K 4.8, creatinine 0.7, TSH normal Labs (5/13): LDL 112, HDL 86, K 4.3, creatinine 0.5 Labs (2/14): TSH normal, LDL 121, HDL 68 Labs (8/14): LDL 134, HDL 76 Labs (11/15): LDL 94, HDL 70 Labs (3/16): K 4.4, creatinine 0.65 Labs (9/17): K 4.4, creatinine 0.54, LDL 85, HDL 84, HCT 41  ECG: NSR, biatrial enlargement, nonspecific ST changes.   PMH:  1. CAD: LHC 2005 with 50-60% mid LAD stenosis.  ETT-myoview (6/10): Exercised 10 minutes, ST depression with exercise, no ischemia or infarction on perfusion images.   2. SVT ablation 2005 3. Aortic stenosis: Moderate.  Echo (3/11) with EF 55-60%, trileaflet aortic valve with mild AS with mean gradient 15 mmHg, mild MR.  Echo (6/13): EF 60-65%, mild AS mean gradient 14 mmHg, mild MR. Echo (9/14) with EF 65-70%, moderate AS with mean gradient 24 mmHg, mild LVH.  Echo (9/15) with EF 60-65%, mild LVH, normal RV size and systolic function, moderate AS with mean gradient 24 mmHg/AVA 1.09 cm^2, mild to moderate MR, PASP 33 mmHg.  - Echo (10/17): EF 55-60%, mild MR, moderate aortic stenosis (mean 123XX123 AI, systolic anterior motion of the mitral valve chordal structures.   4. Hyperlipidemia 5. Osteoporosis. 6. Spinal compression fracture.  7. Atrial flutter: 30 day monitor 10/17 with episode of atrial flutter noted.   FH: HTN.  Father with CABG + AVR at age 74.  Mother with CHF.    SH: Divorced.  Nonsmoker.  Lives in [facility].  Works part time at [employer] and full time at [employer].   ROS: All systems reviewed and negative except as per HPI.    Current Outpatient Prescriptions  Medication Sig Dispense Refill  . CALCIUM PO Take by mouth daily.    . Cholecalciferol (VITAMIN D3) 1000 UNITS CAPS Take 1 capsule by mouth daily.      . Coenzyme Q10 200 MG TABS Take 1 tablet (200 mg total) by mouth daily.  0  . ezetimibe (ZETIA) 10 MG tablet Take 1 tablet (10 mg total) by mouth daily. 30 tablet 3  . fish oil-omega-3 fatty acids 1000 MG capsule Take 1 g by mouth daily.      . nitroGLYCERIN (NITROLINGUAL) 0.4 MG/SPRAY spray Place 1 spray under the tongue as directed.      . rosuvastatin (CRESTOR) 40 MG tablet Take 1 tablet (40 mg total) by mouth daily. 30 tablet 3  . solifenacin (VESICARE) 5 MG tablet Take 1 tablet (5 mg total) by mouth daily. 30 tablet 11  . Thiamine HCl (VITAMIN B-1) 250 MG tablet Take 500 mg by mouth daily.      Marland Kitchen triamcinolone (KENALOG) 0.025 % ointment Apply 1 application topically 2 (two) times daily. 30 g 0  . apixaban (ELIQUIS) 5 MG TABS tablet Take 1 tablet (5 mg total) by mouth 2 (two) times daily.  60 tablet 1  . metoprolol succinate (TOPROL XL) 25 MG 24 hr tablet Take 1 tablet (25 mg total) by mouth daily. 90 tablet 1   No current facility-administered medications for this visit.     BP 122/64   Pulse 92   Ht 5\' 2"  (1.575 m)   Wt 136 lb 12.8 oz (62.1 kg)   BMI 25.02 kg/m  General: NAD Neck: No JVD, no thyromegaly or thyroid nodule.  Lungs: Clear to auscultation bilaterally with normal respiratory effort. CV: Nondisplaced PMI.  Heart regular S1/S2, no [id number], 3/6 systolic crescendo-decrescendo murmur RUSB, some obscuring of the S2.  No peripheral edema.  No carotid bruit.  Normal pedal pulses.  Abdomen: Soft, nontender, no hepatosplenomegaly, no distention.  Neurologic: Alert and oriented x 3.  Psych: Normal affect. Extremities: No  clubbing or cyanosis.   Assessment/Plan: 1. Aortic stenosis: Moderate on 10/17 echo.  Murmur with some obscuring of S2.  Continue to follow.  2. Hyperlipidemia: Good lipids in 9/17.  3. CAD: Moderate nonobstructive disease on prior cath.  Continue ASA 81 and statin.  4. Atrial flutter: Patient had episodes of tachypalpitations, then had 30 day monitor showing a run of atrial flutter with RVR.  Symptoms are infrequent.  However, CHADSVASC = 3. Therefore, I will start her on Eliquis 5 mg bid.  She can stop ASA.  She does not screen positive for OSA and rarely drinks ETOH.  5. 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 I will start her on Toprol XL 25 mg daily.    Loralie Champagne 02/13/16

## 2016-02-18 DIAGNOSIS — Z01419 Encounter for gynecological examination (general) (routine) without abnormal findings: Secondary | ICD-10-CM | POA: Diagnosis not present

## 2016-02-19 LAB — HM MAMMOGRAPHY: HM Mammogram: NORMAL (ref 0–4)

## 2016-02-24 DIAGNOSIS — Z1231 Encounter for screening mammogram for malignant neoplasm of breast: Secondary | ICD-10-CM | POA: Diagnosis not present

## 2016-02-24 LAB — HM MAMMOGRAPHY

## 2016-03-01 ENCOUNTER — Ambulatory Visit (INDEPENDENT_AMBULATORY_CARE_PROVIDER_SITE_OTHER): Payer: Medicare Other

## 2016-03-01 ENCOUNTER — Encounter: Payer: Self-pay | Admitting: Internal Medicine

## 2016-03-01 DIAGNOSIS — M810 Age-related osteoporosis without current pathological fracture: Secondary | ICD-10-CM | POA: Diagnosis not present

## 2016-03-01 MED ORDER — DENOSUMAB 60 MG/ML ~~LOC~~ SOLN
60.0000 mg | Freq: Once | SUBCUTANEOUS | Status: AC
  Administered 2016-03-01: 60 mg via SUBCUTANEOUS

## 2016-03-01 NOTE — Progress Notes (Signed)
Results faxed from [facility]

## 2016-03-15 ENCOUNTER — Ambulatory Visit: Payer: Medicare Other

## 2016-03-16 ENCOUNTER — Ambulatory Visit (INDEPENDENT_AMBULATORY_CARE_PROVIDER_SITE_OTHER): Payer: Medicare Other | Admitting: *Deleted

## 2016-03-16 ENCOUNTER — Ambulatory Visit: Payer: Medicare Other

## 2016-03-16 DIAGNOSIS — D0471 Carcinoma in situ of skin of right lower limb, including hip: Secondary | ICD-10-CM | POA: Diagnosis not present

## 2016-03-16 DIAGNOSIS — I4892 Unspecified atrial flutter: Secondary | ICD-10-CM

## 2016-03-16 LAB — BASIC METABOLIC PANEL
BUN: 14 mg/dL (ref 7–25)
CHLORIDE: 100 mmol/L (ref 98–110)
CO2: 29 mmol/L (ref 20–31)
Calcium: 10.1 mg/dL (ref 8.6–10.4)
Creat: 0.75 mg/dL (ref 0.60–0.93)
GLUCOSE: 86 mg/dL (ref 65–99)
POTASSIUM: 4.6 mmol/L (ref 3.5–5.3)
Sodium: 138 mmol/L (ref 135–146)

## 2016-03-16 LAB — CBC
HEMATOCRIT: 45.3 % — AB (ref 35.0–45.0)
HEMOGLOBIN: 15.3 g/dL (ref 11.7–15.5)
MCH: 31 pg (ref 27.0–33.0)
MCHC: 33.8 g/dL (ref 32.0–36.0)
MCV: 91.7 fL (ref 80.0–100.0)
MPV: 10.1 fL (ref 7.5–12.5)
Platelets: 361 10*3/uL (ref 140–400)
RBC: 4.94 MIL/uL (ref 3.80–5.10)
RDW: 13 % (ref 11.0–15.0)
WBC: 7.1 10*3/uL (ref 3.8–10.8)

## 2016-03-16 NOTE — Progress Notes (Signed)
Pt was started on Eliquis 5mg  twice a day for Atrial Flutter with RVR on 02/12/16 per Dr. Aundra Dubin. Per Dr. Aundra Dubin progress note on 02/12/16 pt will follow-up with Dr. Saunders Revel and he will take over as pt primary Cardiologist.   Reviewed patients medication list.  Pt is not currently on any combined P-gp and strong CYP3A4 inhibitors/inducers (ketoconazole, traconazole, ritonavir, carbamazepine, phenytoin, rifampin, St. John's wort).  Reviewed labs: SCr-0.75, Hgb-15.3, HCT-45.3, Weight-62.2kg.  Dose appropriate based on dosing criteria, age, weight, and SCr.  Hgb and HCT WNL.   A full discussion of the nature of anticoagulants has been carried out.  A benefit/risk analysis has been presented to the patient, so that they understand the justification for choosing anticoagulation with Eliquis at this time.  The need for compliance is stressed.  Pt is aware to take the medication twice daily.  Side effects of potential bleeding are discussed, including unusual colored urine or stools, coughing up blood or coffee ground emesis, nose bleeds or serious fall or head trauma.  Discussed signs and symptoms of stroke. The patient should avoid any OTC items containing aspirin or ibuprofen.  Avoid alcohol consumption.   Call if any signs of abnormal bleeding.  Discussed financial obligations and resolved any difficulty in obtaining medication.  Next lab test test in 6 months.   03/17/16-Reviewed pt labs and discussed with pt. The pt will return in 6 months for an Eliquis follow-up. Appointment set over the phone with pt.

## 2016-04-02 ENCOUNTER — Other Ambulatory Visit: Payer: Self-pay | Admitting: Cardiology

## 2016-04-02 DIAGNOSIS — I4892 Unspecified atrial flutter: Secondary | ICD-10-CM

## 2016-04-21 ENCOUNTER — Ambulatory Visit (INDEPENDENT_AMBULATORY_CARE_PROVIDER_SITE_OTHER): Payer: Medicare Other | Admitting: Adult Health

## 2016-04-21 ENCOUNTER — Encounter: Payer: Self-pay | Admitting: Adult Health

## 2016-04-21 VITALS — BP 126/82 | Temp 98.0°F | Ht 62.0 in | Wt 138.0 lb

## 2016-04-21 DIAGNOSIS — J040 Acute laryngitis: Secondary | ICD-10-CM

## 2016-04-21 MED ORDER — PREDNISONE 20 MG PO TABS: 20.0000 mg | ORAL_TABLET | Freq: Every day | ORAL | 5 refills | Status: DC

## 2016-04-21 MED ORDER — MAGIC MOUTHWASH W/LIDOCAINE: 5.0000 mL | Freq: Three times a day (TID) | ORAL | 0 refills | Status: DC | PRN

## 2016-04-21 NOTE — Progress Notes (Signed)
Pre visit review using our clinic review tool, if applicable. No additional management support is needed unless otherwise documented below in the visit note. 

## 2016-04-21 NOTE — Progress Notes (Signed)
   Subjective:    Patient ID: Carrie [name], female    DOB: 10/15/1945, 70 y.o.   MRN: [id number]  HPI  70 year old female who presents to the office today with an acute issue. Her symptoms include that of generalized fatigue, left side lymph node pain, sore throat, semi productive cough and hoarseness. Her symptoms started on 2-3 days ago.   She denies fevers, n/v/d   Review of Systems  All other systems reviewed and are negative.  See HPI.      Objective:   Physical Exam  Constitutional: She appears well-developed and well-nourished. No distress.  HENT:  Right Ear: Hearing, tympanic membrane, external ear and ear canal normal.  Left Ear: Hearing, tympanic membrane, external ear and ear canal normal.  Nose: No mucosal edema or rhinorrhea. Right sinus exhibits no maxillary sinus tenderness and no frontal sinus tenderness. Left sinus exhibits no maxillary sinus tenderness and no frontal sinus tenderness.  Mouth/Throat: Posterior oropharyngeal erythema present.  Voice hoarseness  Cardiovascular: Normal rate, regular rhythm and intact distal pulses.  Exam reveals no gallop and no friction rub.   Murmur heard. Pulmonary/Chest: Effort normal and breath sounds normal. No respiratory distress. She has no wheezes. She has no rales. She exhibits no tenderness.  Lymphadenopathy:       Head (right side): Submandibular and tonsillar adenopathy present.       Head (left side): Submandibular (tenderness ) and tonsillar adenopathy present.  Skin: Skin is warm and dry. No rash noted. She is not diaphoretic. No erythema. No pallor.  Nursing note and vitals reviewed.     Assessment & Plan:  1. Laryngitis - magic mouthwash w/lidocaine SOLN; Take 5 mLs by mouth 3 (three) times daily as needed.  Dispense: 180 mL; Refill: 0 - predniSONE (DELTASONE) 20 MG tablet; Take 1 tablet (20 mg total) by mouth daily with breakfast.  Dispense: 10 tablet; Refill: 5 - advised conservative measures.  - Follow up  as needed  Dorothyann Peng, NP

## 2016-04-27 ENCOUNTER — Encounter: Payer: Self-pay | Admitting: Internal Medicine

## 2016-04-27 ENCOUNTER — Ambulatory Visit (INDEPENDENT_AMBULATORY_CARE_PROVIDER_SITE_OTHER): Payer: Medicare Other | Admitting: Internal Medicine

## 2016-04-27 DIAGNOSIS — R040 Epistaxis: Secondary | ICD-10-CM

## 2016-04-27 DIAGNOSIS — R490 Dysphonia: Secondary | ICD-10-CM

## 2016-04-27 NOTE — Assessment & Plan Note (Signed)
Likely due to dryness, vaseline to nose BID and saline spray as needed throughout day. Affrin for bleeding which will not stop. She will stop eliquis for 5 days to let heal (taking for a flutter).

## 2016-04-27 NOTE — Progress Notes (Signed)
   Subjective:    Patient ID: Carrie [name], female    DOB: 1946-03-26, 71 y.o.   MRN: [id number]  HPI The patient is a 71 YO female coming in for hoarseness and nosebleeds. She had a cold last week and saw provider and given prednisone and magic mouthwash. She took those and did not feel much better. She had ear pain which is resolved. No cough or congestion in her chest. No ear pain or sinus pain or pressure. She does not have running of her nose. She does have some nosebleed and dry skin in her nose. Using vaseline daily and some moisture spray daily as well. She takes eliquis for her A flutter. She is able to get the nosebleed to stop within 5 minutes but has had several in the last 2 days. No fevers or chills.   Review of Systems  Constitutional: Negative for activity change, appetite change, fatigue, fever and unexpected weight change.  HENT: Positive for voice change. Negative for congestion, ear discharge, ear pain, postnasal drip, rhinorrhea, sinus pain, sinus pressure, sore throat, tinnitus and trouble swallowing.   Eyes: Negative.   Respiratory: Negative.   Cardiovascular: Negative.   Gastrointestinal: Negative.       Objective:   Physical Exam  Constitutional: She appears well-developed and well-nourished.  HENT:  Head: Normocephalic and atraumatic.  Right Ear: External ear normal.  Left Ear: External ear normal.  Nose: Nose normal.  Oropharynx with some erythema no drainage, nose dry without active bleeding, no exposed blood vessels.   Eyes: EOM are normal.  Neck: Normal range of motion. No JVD present.  Cardiovascular: Normal rate and regular rhythm.   Pulmonary/Chest: Effort normal and breath sounds normal.  Abdominal: Soft.  Lymphadenopathy:    She has no cervical adenopathy.   Vitals:   04/27/16 1446  BP: 136/70  Pulse: 85  Resp: 12  Temp: 97.7 F (36.5 C)  TempSrc: Oral  SpO2: 96%  Weight: 137 lb 8 oz (62.4 kg)  Height: 5\' 2"  (1.575 m)      Assessment  & Plan:

## 2016-04-27 NOTE — Progress Notes (Signed)
Pre visit review using our clinic review tool, if applicable. No additional management support is needed unless otherwise documented below in the visit note. 

## 2016-04-27 NOTE — Assessment & Plan Note (Signed)
Likely due to her viral illness and informed that healing could take another 1-2 weeks and call back if problems still then. No antibiotics or additional steroids are appropriate at this time. Can use tea with honey as needed.

## 2016-04-27 NOTE — Patient Instructions (Signed)
You can use affrin for the nosebleed to help it stop faster.  Use vaseline on the rim of the nose twice a day to help with moisture and you can use the saline spray as needed as many times a day as you need.   You can stop the eliquis for 5 days to give the nose a chance to heal.   You do not need more antibiotics. If this is still going on in 1-2 weeks call the office back.    Nosebleed, Adult A nosebleed is when blood comes out of the nose. Nosebleeds are common. Usually, they are not a sign of a serious condition. Nosebleeds can happen if a small blood vessel in your nose starts to bleed or if the lining of your nose (mucous membrane) cracks. They are commonly caused by:  Allergies.  Colds.  Picking your nose.  Blowing your nose too hard.  An injury from sticking an object into your nose or getting hit in the nose.  Dry or cold air. Less common causes of nosebleeds include:  Toxic fumes.  Something abnormal in the nose or in the air-filled spaces in the bones of the face (sinuses).  Growths in the nose, such as polyps.  Medicines or conditions that cause blood to clot slowly.  Certain illnesses or procedures that irritate or dry out the nasal passages. Follow these instructions at home: When you have a nosebleed:  Sit down and tilt your head slightly forward.  Use a clean towel or tissue to pinch your nostrils under the bony part of your nose. After 10 minutes, let go of your nose and see if bleeding starts again. Do not release pressure before that time. If there is still bleeding, repeat the pinching and holding for 10 minutes until the bleeding stops.  Do not place tissues or gauze in the nose to stop bleeding.  Avoid lying down and avoid tilting your head backward. That may make blood collect in the throat and cause gagging or coughing.  Use a nasal spray decongestant to help with a nosebleed as told by your health care provider.  Do not use petroleum jelly or  mineral oil in your nose. It can drip into your lungs. After a nosebleed:  Avoid blowing your nose or sniffing for a number of hours.  Avoid straining, lifting, or bending at the waist for several days. You may resume other normal activities as you are able.  Use saline spray or a humidifier as told by your health care provider.  Aspirinand blood thinners make bleeding more likely. If you are prescribed these medicines and you suffer from nosebleeds:  Ask your health care provider if you should stop taking the medicines or if you should adjust the dose.  Do not stop taking medicines that your health care provider has recommended unless told by your health care provider.  If your nosebleed was caused by dry mucous membranes, use over-the-counter saline nasal spray or gel. This will keep the mucous membranes moist and allow them to heal. If you must use a lubricant:  Choose one that is water-soluble.  Use only as much as you need and use it only as often as needed.  Do not lie down until several hours after you use it. Contact a health care provider if:  You have a fever.  You get nosebleeds often or more often than usual.  You bruise very easily.  You have a nosebleed from having something stuck in your nose.  You have bleeding in your mouth.  You vomit or cough up brown material.  You have a nosebleed after you start a new medicine. Get help right away if:  You have a nosebleed after a fall or a head injury.  Your nosebleed does not go away after 20 minutes.  You feel dizzy or weak.  You have unusual bleeding from other parts of your body.  You have unusual bruising on other parts of your body.  You become sweaty.  You vomit blood. This information is not intended to replace advice given to you by your health care provider. Make sure you discuss any questions you have with your health care provider. Document Released: 01/20/2005 Document Revised: 12/11/2015  Document Reviewed: 10/28/2015 Elsevier Interactive Patient Education  2017 [employer].

## 2016-05-01 ENCOUNTER — Encounter: Payer: Self-pay | Admitting: Internal Medicine

## 2016-05-01 ENCOUNTER — Ambulatory Visit (INDEPENDENT_AMBULATORY_CARE_PROVIDER_SITE_OTHER): Payer: Medicare Other | Admitting: Internal Medicine

## 2016-05-01 DIAGNOSIS — J329 Chronic sinusitis, unspecified: Secondary | ICD-10-CM | POA: Insufficient documentation

## 2016-05-01 DIAGNOSIS — J0110 Acute frontal sinusitis, unspecified: Secondary | ICD-10-CM

## 2016-05-01 MED ORDER — AMOXICILLIN-POT CLAVULANATE 875-125 MG PO TABS: 1.0000 | ORAL_TABLET | Freq: Two times a day (BID) | ORAL | 0 refills | Status: DC

## 2016-05-01 NOTE — Assessment & Plan Note (Signed)
Rx for augmentin 10 day course. She will continue off eliquis for another 3 days as discussed at prior visit to allow nose to heal and reminded again not to pick or vigorous blowing.

## 2016-05-01 NOTE — Progress Notes (Signed)
   Subjective:    Patient ID: Carrie [name], female    DOB: [birth year], 71 y.o.   MRN: [id number]  HPI The patient is a 71 YO female coming in for sinus pain and pressure. Going on for about 2 weeks but worsening in the last 2-3 days. She has been running some fevers at home. No chills but she has been cold. Taking tylenol round the clock the last 2-3 days. Still doing her sinus rinses and held her eliquis per our advice last time. Less nosebleeds but still some mild bloody drainage as well as some yellow in the drainage.   Review of Systems  Constitutional: Positive for fever. Negative for activity change, appetite change, chills, fatigue and unexpected weight change.  HENT: Positive for congestion, ear pain, rhinorrhea, sinus pain, sinus pressure and sore throat. Negative for dental problem, drooling, ear discharge, nosebleeds, postnasal drip and trouble swallowing.   Eyes: Negative.   Respiratory: Positive for cough. Negative for chest tightness, shortness of breath, wheezing and stridor.   Cardiovascular: Negative.   Gastrointestinal: Negative.   Musculoskeletal: Negative.       Objective:   Physical Exam  Constitutional: She is oriented to person, place, and time. She appears well-developed and well-nourished.  HENT:  Head: Normocephalic and atraumatic.  TMs bulging but clear, oropharynx with yellow drainage, nose with crusting some blood and some yellowish, sinus pressure.   Eyes: EOM are normal.  Neck: Normal range of motion. No JVD present.  Cardiovascular: Normal rate and regular rhythm.   Pulmonary/Chest: Effort normal and breath sounds normal.  Abdominal: Soft.  Lymphadenopathy:    She has no cervical adenopathy.  Neurological: She is alert and oriented to person, place, and time.  Skin: Skin is warm and dry.   Vitals:   05/01/16 0938  BP: 124/80  Pulse: 94  Temp: 97.7 F (36.5 C)  TempSrc: Oral  SpO2: 97%  Weight: 137 lb 1.9 oz (62.2 kg)      Assessment &  Plan:

## 2016-05-01 NOTE — Patient Instructions (Signed)
We have sent in augmentin for the sinuses. Take 1 pill twice a day for the next 10 days.    Sinusitis, Adult Sinusitis is soreness and inflammation of your sinuses. Sinuses are hollow spaces in the bones around your face. Your sinuses are located:  Around your eyes.  In the middle of your forehead.  Behind your nose.  In your cheekbones. Your sinuses and nasal passages are lined with a stringy fluid (mucus). Mucus normally drains out of your sinuses. When your nasal tissues become inflamed or swollen, the mucus can become trapped or blocked so air cannot flow through your sinuses. This allows bacteria, viruses, and funguses to grow, which leads to infection. Sinusitis can develop quickly and last for 7?10 days (acute) or for more than 12 weeks (chronic). Sinusitis often develops after a cold. What are the causes? This condition is caused by anything that creates swelling in the sinuses or stops mucus from draining, including:  Allergies.  Asthma.  Bacterial or viral infection.  Abnormally shaped bones between the nasal passages.  Nasal growths that contain mucus (nasal polyps).  Narrow sinus openings.  Pollutants, such as chemicals or irritants in the air.  A foreign object stuck in the nose.  A fungal infection. This is rare. What increases the risk? The following factors may make you more likely to develop this condition:  Having allergies or asthma.  Having had a recent cold or respiratory tract infection.  Having structural deformities or blockages in your nose or sinuses.  Having a weak immune system.  Doing a lot of swimming or diving.  Overusing nasal sprays.  Smoking. What are the signs or symptoms? The main symptoms of this condition are pain and a feeling of pressure around the affected sinuses. Other symptoms include:  Upper toothache.  Earache.  Headache.  Bad breath.  Decreased sense of smell and taste.  A cough that may get worse at  night.  Fatigue.  Fever.  Thick drainage from your nose. The drainage is often green and it may contain pus (purulent).  Stuffy nose or congestion.  Postnasal drip. This is when extra mucus collects in the throat or back of the nose.  Swelling and warmth over the affected sinuses.  Sore throat.  Sensitivity to light. How is this diagnosed? This condition is diagnosed based on symptoms, a medical history, and a physical exam. To find out if your condition is acute or chronic, your health care provider may:  Look in your nose for signs of nasal polyps.  Tap over the affected sinus to check for signs of infection.  View the inside of your sinuses using an imaging device that has a light attached (endoscope). If your health care provider suspects that you have chronic sinusitis, you may also:  Be tested for allergies.  Have a sample of mucus taken from your nose (nasal culture) and checked for bacteria.  Have a mucus sample examined to see if your sinusitis is related to an allergy. If your sinusitis does not respond to treatment and it lasts longer than 8 weeks, you may have an MRI or CT scan to check your sinuses. These scans also help to determine how severe your infection is. In rare cases, a bone biopsy may be done to rule out more serious types of fungal sinus disease. How is this treated? Treatment for sinusitis depends on the cause and whether your condition is chronic or acute. If a virus is causing your sinusitis, your symptoms will go  away on their own within 10 days. You may be given medicines to relieve your symptoms, including:  Topical nasal decongestants. They shrink swollen nasal passages and let mucus drain from your sinuses.  Antihistamines. These drugs block inflammation that is triggered by allergies. This can help to ease swelling in your nose and sinuses.  Topical nasal corticosteroids. These are nasal sprays that ease inflammation and swelling in your nose  and sinuses.  Nasal saline washes. These rinses can help to get rid of thick mucus in your nose. If your condition is caused by bacteria, you will be given an antibiotic medicine. If your condition is caused by a fungus, you will be given an antifungal medicine. Surgery may be needed to correct underlying conditions, such as narrow nasal passages. Surgery may also be needed to remove polyps. Follow these instructions at home: Medicines  Take, use, or apply over-the-counter and prescription medicines only as told by your health care provider. These may include nasal sprays.  If you were prescribed an antibiotic medicine, take it as told by your health care provider. Do not stop taking the antibiotic even if you start to feel better. Hydrate and Humidify  Drink enough water to keep your urine clear or pale yellow. Staying hydrated will help to thin your mucus.  Use a cool mist humidifier to keep the humidity level in your home above 50%.  Inhale steam for 10-15 minutes, 3-4 times a day or as told by your health care provider. You can do this in the bathroom while a hot shower is running.  Limit your exposure to cool or dry air. Rest  Rest as much as possible.  Sleep with your head raised (elevated).  Make sure to get enough sleep each night. General instructions  Apply a warm, moist washcloth to your face 3-4 times a day or as told by your health care provider. This will help with discomfort.  Wash your hands often with soap and water to reduce your exposure to viruses and other germs. If soap and water are not available, use hand sanitizer.  Do not smoke. Avoid being around people who are smoking (secondhand smoke).  Keep all follow-up visits as told by your health care provider. This is important. Contact a health care provider if:  You have a fever.  Your symptoms get worse.  Your symptoms do not improve within 10 days. Get help right away if:  You have a severe  headache.  You have persistent vomiting.  You have pain or swelling around your face or eyes.  You have vision problems.  You develop confusion.  Your neck is stiff.  You have trouble breathing. This information is not intended to replace advice given to you by your health care provider. Make sure you discuss any questions you have with your health care provider. Document Released: 04/12/2005 Document Revised: 12/07/2015 Document Reviewed: 02/05/2015 Elsevier Interactive Patient Education  2017 [employer].

## 2016-05-01 NOTE — Progress Notes (Signed)
Pre visit review using our clinic review tool, if applicable. No additional management support is needed unless otherwise documented below in the visit note. 

## 2016-05-03 ENCOUNTER — Ambulatory Visit: Payer: Medicare Other | Admitting: Family Medicine

## 2016-05-05 ENCOUNTER — Ambulatory Visit: Payer: Medicare Other | Admitting: Internal Medicine

## 2016-05-27 ENCOUNTER — Encounter: Payer: Self-pay | Admitting: Adult Health

## 2016-05-27 ENCOUNTER — Ambulatory Visit (INDEPENDENT_AMBULATORY_CARE_PROVIDER_SITE_OTHER): Payer: Medicare Other | Admitting: Adult Health

## 2016-05-27 VITALS — BP 136/72 | Temp 98.1°F | Ht 62.0 in | Wt 137.8 lb

## 2016-05-27 DIAGNOSIS — J3089 Other allergic rhinitis: Secondary | ICD-10-CM | POA: Diagnosis not present

## 2016-05-27 NOTE — Progress Notes (Signed)
Subjective:    Patient ID: Carrie [name], female    DOB: 03-06-1946, 71 y.o.   MRN: [id number]  HPI  71 year old female who presents to the office today for concern of blood streaked mucus. She reports that she has had post nasal drip for some time and believes that this is from her seasonal allergies. She does not endorse any fevers or sinus pain/pressure. She is not feeling ill. Her concern is that of when she wakes up in the morning she has blood tinged mucus. She denies copious amounts of blood. States " I feel like there is s cab in my nose that is bleeding and is causing this to happen." She continues to use her nasal rinses. Over the past few days she reports improvement in the amount of blood she has noticed. Does not notice any blood tingled sputum during the day   Denies any cough.   Review of Systems  Constitutional: Negative.   HENT: Positive for postnasal drip. Negative for rhinorrhea, sinus pain, sinus pressure and sore throat.   Respiratory: Negative.   Cardiovascular: Negative.   All other systems reviewed and are negative.  Past Medical History:  Diagnosis Date  . Contact lens/glasses fitting    contacts or gl;asses  . Coronary artery disease   . Osteoporosis, unspecified   . Other and unspecified hyperlipidemia   . Other specified cardiac dysrhythmias(427.89)   . Undiagnosed cardiac murmurs   . Wears hearing aid    left    Social History   Social History  . Marital status: Divorced    Spouse name: N/A  . Number of children: N/A  . Years of education: N/A   Occupational History  . Not on file.   Social History Main Topics  . Smoking status: Former Smoker    Quit date: 04/05/1999  . Smokeless tobacco: Never Used     Comment: Regular Exercise-No  . Alcohol use 1.8 oz/week    3 Glasses of wine per week  . Drug use: No  . Sexual activity: Not Currently   Other Topics Concern  . Not on file   Social History Narrative  . No narrative on file     Past Surgical History:  Procedure Laterality Date  . ATRIAL ABLATION SURGERY  2005  . BREAST SURGERY  1982   Benign tumor removed RT  . BUNIONECTOMY  04/07/2012   Procedure: Lillard Anes;  Surgeon: Alta Corning, MD;  Location: [facility];  Service: Orthopedics;  Laterality: Left;  CHEVRON OSTEOTOMY LEFT FOOT   . CARDIAC CATHETERIZATION  2005  . GANGLION CYST EXCISION     left wrist  . HERNIA REPAIR  1990   rt ing  . TONSILLECTOMY    . TUBAL LIGATION      Family History  Problem Relation Age of Onset  . Heart disease Mother 23    CABG and valve replacement; CHF  . Congestive Heart Failure Mother   . Hypertension Other     Allergies  Allergen Reactions  . Codeine   . Crestor [Rosuvastatin] Other (See Comments)    Crestor 40mg  fatigue and exhaustion--pt tolerates crestor 20mg     Current Outpatient Prescriptions on File Prior to Visit  Medication Sig Dispense Refill  . amoxicillin-clavulanate (AUGMENTIN) 875-125 MG tablet Take 1 tablet by mouth 2 (two) times daily. 20 tablet 0  . CALCIUM PO Take by mouth daily.    . Cholecalciferol (VITAMIN D3) 1000 UNITS CAPS Take 1 capsule  by mouth daily.      . Coenzyme Q10 200 MG TABS Take 1 tablet (200 mg total) by mouth daily.  0  . ELIQUIS 5 MG TABS tablet TAKE 1 TABLET (5 MG TOTAL) BY MOUTH 2 (TWO) TIMES DAILY. (Patient not taking: Reported on 05/01/2016) 60 tablet 10  . ezetimibe (ZETIA) 10 MG tablet Take 1 tablet (10 mg total) by mouth daily. 30 tablet 3  . fish oil-omega-3 fatty acids 1000 MG capsule Take 1 g by mouth daily.      . metoprolol succinate (TOPROL XL) 25 MG 24 hr tablet Take 1 tablet (25 mg total) by mouth daily. 90 tablet 1  . nitroGLYCERIN (NITROLINGUAL) 0.4 MG/SPRAY spray Place 1 spray under the tongue as directed.      . rosuvastatin (CRESTOR) 40 MG tablet Take 1 tablet (40 mg total) by mouth daily. 30 tablet 3  . solifenacin (VESICARE) 5 MG tablet Take 1 tablet (5 mg total) by mouth daily. 30  tablet 11  . Thiamine HCl (VITAMIN B-1) 250 MG tablet Take 500 mg by mouth daily.      Marland Kitchen triamcinolone (KENALOG) 0.025 % ointment Apply 1 application topically 2 (two) times daily. 30 g 0   No current facility-administered medications on file prior to visit.     BP 136/72   Temp 98.1 F (36.7 C) (Oral)   Ht 5\' 2"  (1.575 m)   Wt 137 lb 12.8 oz (62.5 kg)   BMI 25.20 kg/m       Objective:   Physical Exam  Constitutional: She is oriented to person, place, and time. She appears well-developed and well-nourished. No distress.  HENT:  Head: Normocephalic and atraumatic.  Right Ear: External ear normal.  Left Ear: External ear normal.  Nose: No mucosal edema, rhinorrhea, nose lacerations or sinus tenderness. No epistaxis.  No foreign bodies. Right sinus exhibits no maxillary sinus tenderness and no frontal sinus tenderness. Left sinus exhibits no maxillary sinus tenderness and no frontal sinus tenderness.  Mouth/Throat: Uvula is midline, oropharynx is clear and moist and mucous membranes are normal. No oropharyngeal exudate.  Cardiovascular: Normal rate, regular rhythm, normal heart sounds and intact distal pulses.  Exam reveals no gallop and no friction rub.   No murmur heard. Pulmonary/Chest: Effort normal and breath sounds normal. No respiratory distress. She has no wheezes. She has no rales. She exhibits no tenderness.  Neurological: She is alert and oriented to person, place, and time.  Skin: Skin is warm and dry. No rash noted. She is not diaphoretic. No erythema. No pallor.  Psychiatric: She has a normal mood and affect. Her behavior is normal. Thought content normal.  Nursing note and vitals reviewed.     Assessment & Plan:  1. Chronic nonseasonal allergic rhinitis due to pollen - I am hesitant to stop Eliquis for a few days a this time. I am not concerned for pneumonia. No scab or abrasion seen in the nose.  - Can try claritin for the next few days - Continue with saline wash   - Follow up with PCP if no improvement  Dorothyann Peng, NP

## 2016-07-01 ENCOUNTER — Encounter: Payer: Self-pay | Admitting: Internal Medicine

## 2016-07-08 ENCOUNTER — Encounter: Payer: Self-pay | Admitting: Internal Medicine

## 2016-07-08 ENCOUNTER — Ambulatory Visit (INDEPENDENT_AMBULATORY_CARE_PROVIDER_SITE_OTHER): Payer: Medicare Other | Admitting: Internal Medicine

## 2016-07-08 ENCOUNTER — Encounter (INDEPENDENT_AMBULATORY_CARE_PROVIDER_SITE_OTHER): Payer: Self-pay

## 2016-07-08 VITALS — BP 128/58 | HR 102 | Ht 62.0 in | Wt 135.0 lb

## 2016-07-08 DIAGNOSIS — I2510 Atherosclerotic heart disease of native coronary artery without angina pectoris: Secondary | ICD-10-CM

## 2016-07-08 DIAGNOSIS — I350 Nonrheumatic aortic (valve) stenosis: Secondary | ICD-10-CM | POA: Diagnosis not present

## 2016-07-08 DIAGNOSIS — I340 Nonrheumatic mitral (valve) insufficiency: Secondary | ICD-10-CM

## 2016-07-08 DIAGNOSIS — I4892 Unspecified atrial flutter: Secondary | ICD-10-CM

## 2016-07-08 DIAGNOSIS — E785 Hyperlipidemia, unspecified: Secondary | ICD-10-CM

## 2016-07-08 MED ORDER — METOPROLOL SUCCINATE ER 50 MG PO TB24: 50.0000 mg | ORAL_TABLET | Freq: Every day | ORAL | 1 refills | Status: DC

## 2016-07-08 NOTE — Patient Instructions (Signed)
Medication Instructions:  Increase Toprol XL (metoprolol succinate) to 50mg  daily. You can take 2 of your 25mg  tablets daily at the same time and use your current supply.  Labwork: Your physician recommends that you return for a FASTING lipid profile /ALT in the next week or so at your convenience.   Testing/Procedures: Your physician has requested that you have an echocardiogram. Echocardiography is a painless test that uses sound waves to create images of your heart. It provides your doctor with information about the size and shape of your heart and how well your heart's chambers and valves are working. This procedure takes approximately one hour. There are no restrictions for this procedure.  IN 6 MONTHS a few days before you see Dr End in 6 months.   Follow-Up: Your physician wants you to follow-up in: 6 months with Dr End. (September 2018). You will receive a reminder letter in the mail two months in advance. If you don't receive a letter, please call our office to schedule the follow-up appointment. Have the echocardiogram a few days before your appointment with Dr End in 6 months.      If you need a refill on your cardiac medications before your next appointment, please call your pharmacy.

## 2016-07-08 NOTE — Progress Notes (Signed)
Follow-up Outpatient Visit Date: 07/08/2016  Primary Care Provider: Scarlette Calico, MD [address] [address]  Chief Complaint: Follow-up aortic stenosis, CAD, and atria flutter  HPI:  Carrie [name] is a 71 y.o. year-old female with history of non-obstructive coronary artery disease, paroxysmal atrial flutter, aortic stenosis (moderate by echo in 01/2016), who presents for follow-up of atrial flutter and aortic stenosis. She was previously followed in our clinic by Dr. Aundra Dubin, having last been seen in 01/2016. Her only complaint is of occasional dizziness when she lies down. She reports feeling somewhat unsteady for a few minutes. She denies lightheadedness when sitting or standing up. She also denies chest pain, palpitations, shortness of breath, orthopnea, PND, and edema. She is tolerating metoprolol and apixaban well. She noted streaks of blood when blowing her nose a couple of times after a sinus infection this winter. She otherwise has not had any significant bleeding. She notes that her blood pressure is typically well-controlled at home.  --------------------------------------------------------------------------------------------------  Cardiovascular History & Procedures: Cardiovascular Problems:  Paroxysmal atrial flutter  Non-obstructive coronary artery disease  Aortic stenosis  Risk Factors:  Known CAD and age > 20  Cath/PCI:  LHC (07/24/03): LMCA heavily calcified without significant stenosis. LAD with heavy calcification proximally. 50-60% stenosis just distal to D2. LCx calcified proximally without significant stenosis. Dominant RCA with moderate proximal calcification and mild luminal irregularities throughout. LVEDP 12. LVEF 70%.  CV Surgery:  None  EP Procedures and Devices:  30-day event monitor (12/17/15): Predominantly sinus rhythm with episode of atrial flutter with rate ~100 bpm.  Non-Invasive Evaluation(s):  TTE (02/10/16): Normal  LV size with mild LVH and focal basal hypertrophy of the septum. LVEF 55-60% with grade 1 diastolic dysfunction and elevated filling pressure. Calcified aortic valve with moderate stenosis (mean gradient 29 mmHg, AVA 1.01 cm^2). MAC with chordal SAM and mild MR. Mild TR. Normal RV size and function.  Recent CV Pertinent Labs: Lab Results  Component Value Date   CHOL 191 08/18/2015   HDL 84 08/18/2015   LDLCALC 85 08/18/2015   LDLDIRECT 133.8 12/19/2012   TRIG 112 08/18/2015   CHOLHDL 2.3 08/18/2015   K 4.6 03/16/2016   BUN 14 03/16/2016   CREATININE 0.75 03/16/2016    Past medical and surgical history were reviewed and updated in EPIC.  Outpatient Encounter Prescriptions as of 07/08/2016  Medication Sig  . CALCIUM PO Take by mouth daily.  . Cholecalciferol (VITAMIN D3) 1000 UNITS CAPS Take 1 capsule by mouth daily.    . Coenzyme Q10 200 MG TABS Take 1 tablet (200 mg total) by mouth daily.  Marland Kitchen ELIQUIS 5 MG TABS tablet TAKE 1 TABLET (5 MG TOTAL) BY MOUTH 2 (TWO) TIMES DAILY.  Marland Kitchen ezetimibe (ZETIA) 10 MG tablet Take 1 tablet (10 mg total) by mouth daily.  . fish oil-omega-3 fatty acids 1000 MG capsule Take 1 g by mouth daily.    . metoprolol succinate (TOPROL XL) 25 MG 24 hr tablet Take 1 tablet (25 mg total) by mouth daily.  . nitroGLYCERIN (NITROLINGUAL) 0.4 MG/SPRAY spray Place 1 spray under the tongue as directed.    . rosuvastatin (CRESTOR) 40 MG tablet Take 1 tablet (40 mg total) by mouth daily.  . solifenacin (VESICARE) 5 MG tablet Take 1 tablet (5 mg total) by mouth daily.  . Thiamine HCl (VITAMIN B-1) 250 MG tablet Take 500 mg by mouth daily.    . [DISCONTINUED] amoxicillin-clavulanate (AUGMENTIN) 875-125 MG tablet Take 1 tablet  by mouth 2 (two) times daily. (Patient not taking: Reported on 07/08/2016)  . [DISCONTINUED] triamcinolone (KENALOG) 0.025 % ointment Apply 1 application topically 2 (two) times daily. (Patient not taking: Reported on 07/08/2016)   No facility-administered  encounter medications on file as of 07/08/2016.     Allergies: Codeine and Crestor [rosuvastatin]  Social History   Social History  . Marital status: Divorced    Spouse name: N/A  . Number of children: N/A  . Years of education: N/A   Occupational History  . Not on file.   Social History Main Topics  . Smoking status: Former Smoker    Quit date: 04/05/1999  . Smokeless tobacco: Never Used     Comment: Regular Exercise-No  . Alcohol use 1.8 oz/week    3 Glasses of wine per week  . Drug use: No  . Sexual activity: Not Currently   Other Topics Concern  . Not on file   Social History Narrative  . No narrative on file    Family History  Problem Relation Age of Onset  . Heart disease Mother 58    CABG and valve replacement; CHF  . Congestive Heart Failure Mother   . Hypertension Other     Review of Systems: A 12-system review of systems was performed and was negative except as noted in the HPI.  --------------------------------------------------------------------------------------------------  Physical Exam: BP (!) 128/58   Pulse (!) 102   Ht 5' 2"  (1.575 m)   Wt 135 lb (61.2 kg)   BMI 24.69 kg/m   Repeat pulse: 92 bpm  General:  Well-developed, well-nourished woman, seated comfortably in the exam room. HEENT: No conjunctival pallor or scleral icterus.  Moist mucous membranes.  OP clear. Neck: Supple without lymphadenopathy, thyromegaly, JVD, or HJR. Lungs: Normal work of breathing.  Clear to auscultation bilaterally without wheezes or crackles. Heart: Regular rate and rhythm with 3/6 systolic crescendo-decrescendo murmur loudest at the LUSB and radiating to the carotids. A2 is somewhat indistinct.  Non-displaced PMI. Abd: Bowel sounds present.  Soft, NT/ND without hepatosplenomegaly Ext: No lower extremity edema.  Radial, PT, and DP pulses are 2+ bilaterally. Skin: warm and dry without rash  EKG:  Sinus tachycardia (HR 102 bpm) with biatrial enlargement and  non-specific ST/T abnormalities.  Lab Results  Component Value Date   WBC 7.1 03/16/2016   HGB 15.3 03/16/2016   HCT 45.3 (H) 03/16/2016   MCV 91.7 03/16/2016   PLT 361 03/16/2016    Lab Results  Component Value Date   NA 138 03/16/2016   K 4.6 03/16/2016   CL 100 03/16/2016   CO2 29 03/16/2016   BUN 14 03/16/2016   CREATININE 0.75 03/16/2016   GLUCOSE 86 03/16/2016   ALT 16 01/20/2016    Lab Results  Component Value Date   CHOL 191 08/18/2015   HDL 84 08/18/2015   LDLCALC 85 08/18/2015   LDLDIRECT 133.8 12/19/2012   TRIG 112 08/18/2015   CHOLHDL 2.3 08/18/2015    --------------------------------------------------------------------------------------------------  ASSESSMENT AND PLAN: Aortic stenosis No symptoms of critical AS. Echo last October showed stable moderate AS. We discussed the natural history of aortic stenosis and will plan to repeat an echo in 6 months. Patient to contact us if lightheadedness, dyspnea, or chest pain develop in the meantime.  Paroxysmal atrial flutter The patient denies palpitations. EKG today shows sinus tachycardia. She is tolerating apixaban well, which we will continue given her CHADSVASC score of 3.  Mitral regurgitation and chordal systolic anterior motion Patient  is incompletely beta-blocked. We will increase metoprolol succinate to 50 mg daily.  Coronary artery disease with remote NSTEMI No symptoms to suggest worsening coronary insufficiency. We will continue with aggressive medical therapy, given 50-60% mid LAD stenosis noted on catheterization in 2003.  Hyperlipidemia The patient is tolerating rosuvastatin 20 mg daily and ezetimibe well. We will recheck a fasting lipid panel and ALT at the patient convenience. If her LAD is significantly above our goal of 70, we will need to consider PCSK9 inhibitor, as she has been intolerant of rosuvastatin 40 mg daily.  Follow-up: Return to clinic in 6 months with echocardiogram before  visit.  Nelva Bush, MD 07/08/2016 2:38 PM

## 2016-07-13 ENCOUNTER — Other Ambulatory Visit: Payer: Medicare Other | Admitting: *Deleted

## 2016-07-13 DIAGNOSIS — I350 Nonrheumatic aortic (valve) stenosis: Secondary | ICD-10-CM

## 2016-07-13 DIAGNOSIS — E785 Hyperlipidemia, unspecified: Secondary | ICD-10-CM

## 2016-07-13 LAB — ALT: ALT: 57 IU/L — AB (ref 0–32)

## 2016-07-13 LAB — LIPID PANEL
CHOL/HDL RATIO: 3.5 ratio (ref 0.0–4.4)
Cholesterol, Total: 225 mg/dL — ABNORMAL HIGH (ref 100–199)
HDL: 64 mg/dL (ref >39)
LDL CALC: 117 mg/dL — AB (ref 0–99)
Triglycerides: 219 mg/dL — ABNORMAL HIGH (ref 0–149)
VLDL Cholesterol Cal: 44 mg/dL — ABNORMAL HIGH (ref 5–40)

## 2016-07-14 ENCOUNTER — Telehealth: Payer: Self-pay | Admitting: Internal Medicine

## 2016-07-14 NOTE — Telephone Encounter (Signed)
Pt is returning the call regarding her labs. Please call back.

## 2016-07-14 NOTE — Telephone Encounter (Signed)
I spoke with the patient regarding the results of her lipid panel and ALT. Due to mild transaminitis, we will discontinue rosuvastatin at this time with plan to recheck ALT in about a month. We discussed referral to the lipid clinic for consideration of PCSK9 inhibitor therapy, but the patient would like to discuss this with her daughter before proceeding with the referral. She will contact us by phone or schedule an office visit after speaking with her family.  Nelva Bush, MD [facility] Pager: [phone]

## 2016-07-14 NOTE — Telephone Encounter (Signed)
Left message on patient's mobile number asking to call us in the [place] office to discuss results of lipid panel and ALT. Given elevated LDL and ALT, we will need to consider discontinuation of rosuvastatin and referral to lipid clinic to discuss PCSK9 inhibitor therapy given history of coronary artery disease.  Nelva Bush, MD [facility] Pager: [phone]

## 2016-07-16 ENCOUNTER — Ambulatory Visit (INDEPENDENT_AMBULATORY_CARE_PROVIDER_SITE_OTHER): Payer: Medicare Other | Admitting: Internal Medicine

## 2016-07-16 ENCOUNTER — Encounter (INDEPENDENT_AMBULATORY_CARE_PROVIDER_SITE_OTHER): Payer: Self-pay

## 2016-07-16 VITALS — BP 130/72 | HR 72 | Ht 62.0 in | Wt 134.0 lb

## 2016-07-16 DIAGNOSIS — E785 Hyperlipidemia, unspecified: Secondary | ICD-10-CM | POA: Diagnosis not present

## 2016-07-16 DIAGNOSIS — R945 Abnormal results of liver function studies: Secondary | ICD-10-CM

## 2016-07-16 DIAGNOSIS — R7989 Other specified abnormal findings of blood chemistry: Secondary | ICD-10-CM

## 2016-07-16 NOTE — Progress Notes (Signed)
Follow-up Outpatient Visit Date: 07/16/2016  Primary Care Provider: Scarlette Calico, MD [address] [address]  Chief Complaint: Discuss hyperlipidemia and medical treatment options.  HPI:  Carrie [name] is a 71 y.o. year-old female with history of non-obstructive coronary artery disease, paroxysmal atrial flutter, aortic stenosis (moderate by echo in 01/2016), who presents to discuss results of recent lipid panel and LFT's. I saw her last week for routine follow-up of her chronic cardiac issues. The patient had a fasting lipid panel and ALT drawn after our visit, which showed elevated LDL and ALT. Today, she reports that she has not been taking rosuvastatin or ezetimibe for at least 6 weeks. She did not have any side effects while taking the medicaitions, noting there stress in her life made it challenging for her to take these medications regularly. She denies abdominal pain, nausea, or vomiting. Ms. Case is interested in restarting lipid therapy and also beginning an exercise program. She drinks 1 glass of wine per week. She does not use acetaminophen or other analgesics on a regular basis.  --------------------------------------------------------------------------------------------------  Cardiovascular History & Procedures: Cardiovascular Problems:  Paroxysmal atrial flutter  Non-obstructive coronary artery disease  Aortic stenosis  Risk Factors:  Known CAD and age > 59  Cath/PCI:  LHC (07/24/03): LMCA heavily calcified without significant stenosis. LAD with heavy calcification proximally. 50-60% stenosis just distal to D2. LCx calcified proximally without significant stenosis. Dominant RCA with moderate proximal calcification and mild luminal irregularities throughout. LVEDP 12. LVEF 70%.  CV Surgery:  None  EP Procedures and Devices:  30-day event monitor (12/17/15): Predominantly sinus rhythm with episode of atrial flutter with rate ~100  bpm.  Non-Invasive Evaluation(s):  TTE (02/10/16): Normal LV size with mild LVH and focal basal hypertrophy of the septum. LVEF 55-60% with grade 1 diastolic dysfunction and elevated filling pressure. Calcified aortic valve with moderate stenosis (mean gradient 29 mmHg, AVA 1.01 cm^2). MAC with chordal SAM and mild MR. Mild TR. Normal RV size and function.  Recent CV Pertinent Labs: Lab Results  Component Value Date   CHOL 225 (H) 07/13/2016   HDL 64 07/13/2016   LDLCALC 117 (H) 07/13/2016   LDLDIRECT 133.8 12/19/2012   TRIG 219 (H) 07/13/2016   CHOLHDL 3.5 07/13/2016   CHOLHDL 2.3 08/18/2015   K 4.6 03/16/2016   BUN 14 03/16/2016   CREATININE 0.75 03/16/2016    Past medical and surgical history were reviewed and updated in EPIC.  Outpatient Encounter Prescriptions as of 07/16/2016  Medication Sig  . CALCIUM PO Take by mouth daily.  . Cholecalciferol (VITAMIN D3) 1000 UNITS CAPS Take 1 capsule by mouth daily.    . Coenzyme Q10 200 MG TABS Take 1 tablet (200 mg total) by mouth daily.  Marland Kitchen ELIQUIS 5 MG TABS tablet TAKE 1 TABLET (5 MG TOTAL) BY MOUTH 2 (TWO) TIMES DAILY.  . fish oil-omega-3 fatty acids 1000 MG capsule Take 1 g by mouth daily.    . metoprolol succinate (TOPROL-XL) 50 MG 24 hr tablet Take 1 tablet (50 mg total) by mouth daily. Take with or immediately following a meal.  . nitroGLYCERIN (NITROLINGUAL) 0.4 MG/SPRAY spray Place 1 spray under the tongue as directed.    . Thiamine HCl (VITAMIN B-1) 250 MG tablet Take 500 mg by mouth daily.    . [DISCONTINUED] rosuvastatin (CRESTOR) 20 MG tablet Take 20 mg by mouth daily.  . [DISCONTINUED] solifenacin (VESICARE) 5 MG tablet Take 1 tablet (5 mg total) by mouth  daily.  . [DISCONTINUED] ezetimibe (ZETIA) 10 MG tablet Take 1 tablet (10 mg total) by mouth daily.   No facility-administered encounter medications on file as of 07/16/2016.     Allergies: Codeine and Crestor [rosuvastatin]  Social History   Social History  .  Marital status: Divorced    Spouse name: N/A  . Number of children: N/A  . Years of education: N/A   Occupational History  . Not on file.   Social History Main Topics  . Smoking status: Former Smoker    Quit date: 04/05/1999  . Smokeless tobacco: Never Used     Comment: Regular Exercise-No  . Alcohol use 4.2 oz/week    7 Glasses of wine per week  . Drug use: No  . Sexual activity: Not Currently   Other Topics Concern  . Not on file   Social History Narrative  . No narrative on file    Family History  Problem Relation Age of Onset  . Heart disease Mother 43    CABG and valve replacement; CHF  . Congestive Heart Failure Mother   . Hypertension Other     Review of Systems: No performed.  --------------------------------------------------------------------------------------------------  Physical Exam: BP 130/72   Pulse 72   Ht _0  (1.575 m)   Wt 134 lb (60.8 kg)   SpO2 97%   BMI 24.51 kg/m    General:  Well-developed, well-nourished woman, seated comfortably in the exam room.   Lab Results  Component Value Date   WBC 7.1 03/16/2016   HGB 15.3 03/16/2016   HCT 45.3 (H) 03/16/2016   MCV 91.7 03/16/2016   PLT 361 03/16/2016    Lab Results  Component Value Date   NA 138 03/16/2016   K 4.6 03/16/2016   CL 100 03/16/2016   CO2 29 03/16/2016   BUN 14 03/16/2016   CREATININE 0.75 03/16/2016   GLUCOSE 86 03/16/2016   ALT 57 (H) 07/13/2016    Lab Results  Component Value Date   CHOL 225 (H) 07/13/2016   HDL 64 07/13/2016   LDLCALC 117 (H) 07/13/2016   LDLDIRECT 133.8 12/19/2012   TRIG 219 (H) 07/13/2016   CHOLHDL 3.5 07/13/2016    --------------------------------------------------------------------------------------------------  ASSESSMENT AND PLAN: Hyperlipidemia and transaminitis The patient has been off rosuvastatin and ezetimibe for >6 weeks, likely explaining her elevated LDL (goal < 70). She is willing to try rosuvastatin again. Given her  mildly elevated ALT, we will defer restarting statin therapy at this time. We will recheck an ALT in 1 month; if it has normalized at that time, we will restart rosuvastatin at 5 mg daily. Ms. Kujala was advised to avoid acetaminophen and to minimize her alcohol consumption. I encouraged her to begin exercising and to take advantage of the [employer]ne[employer] program.  Follow-up: Return to clinic in 6 months with echocardiogram before visit, as previously discussed.  Nelva Bush, MD 07/17/2016 3:28 PM

## 2016-07-16 NOTE — Patient Instructions (Signed)
Medication Instructions:  Your physician recommends that you continue on your current medications as directed. Please refer to the Current Medication list given to you today.  Labwork: Your physician recommends that you return for lab work in: 1 month--this is scheduled for Friday August 13, 2016-the lab is open from 7:30AM-5PM.   Testing/Procedures: None   Follow-Up: Your physician recommends that you schedule a follow-up appointment in: 6 months with Dr End a few days after your have your echocardiogram.        If you need a refill on your cardiac medications before your next appointment, please call your pharmacy.

## 2016-07-17 ENCOUNTER — Encounter: Payer: Self-pay | Admitting: Internal Medicine

## 2016-08-05 ENCOUNTER — Other Ambulatory Visit: Payer: Self-pay | Admitting: Cardiology

## 2016-08-05 DIAGNOSIS — I4892 Unspecified atrial flutter: Secondary | ICD-10-CM

## 2016-08-11 ENCOUNTER — Telehealth: Payer: Self-pay | Admitting: Internal Medicine

## 2016-08-11 NOTE — Telephone Encounter (Signed)
Dustin told me to send this to you.

## 2016-08-11 NOTE — Telephone Encounter (Signed)
Patient called [facility] office with concerns of stomach issues/bloating.   Please call to address.

## 2016-08-11 NOTE — Telephone Encounter (Signed)
She will need a visit.  Can you schedule pt, I still am not able to call out.

## 2016-08-11 NOTE — Telephone Encounter (Signed)
Routing to Stef, does pt need to have ov?

## 2016-08-12 NOTE — Telephone Encounter (Signed)
Got patient scheduled

## 2016-08-13 ENCOUNTER — Other Ambulatory Visit: Payer: Medicare Other

## 2016-08-13 DIAGNOSIS — R945 Abnormal results of liver function studies: Secondary | ICD-10-CM | POA: Diagnosis not present

## 2016-08-13 DIAGNOSIS — R7989 Other specified abnormal findings of blood chemistry: Secondary | ICD-10-CM

## 2016-08-13 LAB — HEPATIC FUNCTION PANEL
ALT: 42 IU/L — ABNORMAL HIGH (ref 0–32)
AST: 35 IU/L (ref 0–40)
Albumin: 4 g/dL (ref 3.5–4.8)
Alkaline Phosphatase: 85 IU/L (ref 39–117)
BILIRUBIN TOTAL: 0.3 mg/dL (ref 0.0–1.2)
Bilirubin, Direct: 0.09 mg/dL (ref 0.00–0.40)
TOTAL PROTEIN: 7.4 g/dL (ref 6.0–8.5)

## 2016-08-16 ENCOUNTER — Ambulatory Visit (INDEPENDENT_AMBULATORY_CARE_PROVIDER_SITE_OTHER): Payer: Medicare Other | Admitting: Internal Medicine

## 2016-08-16 ENCOUNTER — Encounter: Payer: Self-pay | Admitting: Internal Medicine

## 2016-08-16 ENCOUNTER — Other Ambulatory Visit (INDEPENDENT_AMBULATORY_CARE_PROVIDER_SITE_OTHER): Payer: Medicare Other

## 2016-08-16 VITALS — BP 140/70 | HR 70 | Temp 97.9°F | Resp 16 | Ht 62.0 in | Wt 136.2 lb

## 2016-08-16 DIAGNOSIS — R7989 Other specified abnormal findings of blood chemistry: Secondary | ICD-10-CM

## 2016-08-16 DIAGNOSIS — R945 Abnormal results of liver function studies: Principal | ICD-10-CM

## 2016-08-16 LAB — HEPATIC FUNCTION PANEL
ALBUMIN: 4.1 g/dL (ref 3.5–5.2)
ALK PHOS: 83 U/L (ref 39–117)
ALT: 41 U/L — ABNORMAL HIGH (ref 0–35)
AST: 36 U/L (ref 0–37)
Bilirubin, Direct: 0 mg/dL (ref 0.0–0.3)
TOTAL PROTEIN: 8 g/dL (ref 6.0–8.3)
Total Bilirubin: 0.4 mg/dL (ref 0.2–1.2)

## 2016-08-16 NOTE — Progress Notes (Signed)
Subjective:  Patient ID: Carrie [name], female    DOB: November 03, 1945  Age: 71 y.o. MRN: [id number]  CC: Elevated Hepatic Enzymes   HPI Carrie [name] presents for concerns about a very mildly elevated ALT level. She had labs done by cardiology and was found to have a slightly elevated level and was sent to see me for further evaluation. 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 She has had no recent episodes of abdominal pain, nausea, vomiting, loss of appetite, or weight loss.  Outpatient Medications Prior to Visit  Medication Sig Dispense Refill  . CALCIUM PO Take by mouth daily.    . Cholecalciferol (VITAMIN D3) 1000 UNITS CAPS Take 1 capsule by mouth daily.      Marland Kitchen ELIQUIS 5 MG TABS tablet TAKE 1 TABLET (5 MG TOTAL) BY MOUTH 2 (TWO) TIMES DAILY. 60 tablet 10  . fish oil-omega-3 fatty acids 1000 MG capsule Take 1 g by mouth daily.      . metoprolol succinate (TOPROL-XL) 50 MG 24 hr tablet Take 1 tablet (50 mg total) by mouth daily. Take with or immediately following a meal. 90 tablet 1  . Thiamine HCl (VITAMIN B-1) 250 MG tablet Take 500 mg by mouth daily.      . Coenzyme Q10 200 MG TABS Take 1 tablet (200 mg total) by mouth daily.  0  . nitroGLYCERIN (NITROLINGUAL) 0.4 MG/SPRAY spray Place 1 spray under the tongue as directed.       No facility-administered medications prior to visit.     ROS Review of Systems  Constitutional: Negative for activity change, appetite change, chills, diaphoresis, fatigue and unexpected weight change.  HENT: Negative.   Eyes: Negative for visual disturbance.  Respiratory: Negative for cough, chest tightness, shortness of breath and wheezing.   Cardiovascular: Negative.  Negative for chest pain, palpitations and leg swelling.  Gastrointestinal: Negative for abdominal pain, constipation, diarrhea, nausea and vomiting.  Endocrine: Negative.   Genitourinary: Negative.  Negative for  difficulty urinating.  Musculoskeletal: Negative.  Negative for back pain, myalgias and neck pain.  Skin: Negative.  Negative for color change and rash.  Allergic/Immunologic: Negative.   Neurological: Negative.  Negative for dizziness.  Hematological: Negative for adenopathy. Does not bruise/bleed easily.  Psychiatric/Behavioral: Negative.     Objective:  BP 140/70 (BP Location: Left Arm, Patient Position: Sitting, Cuff Size: Normal)   Pulse 70   Temp 97.9 F (36.6 C) (Oral)   Resp 16   Ht 5\' 2"  (1.575 m)   Wt 136 lb 4 oz (61.8 kg)   SpO2 99%   BMI 24.92 kg/m   BP Readings from Last 3 Encounters:  08/16/16 140/70  07/16/16 130/72  07/08/16 (!) 128/58    Wt Readings from Last 3 Encounters:  08/16/16 136 lb 4 oz (61.8 kg)  07/16/16 134 lb (60.8 kg)  07/08/16 135 lb (61.2 kg)    Physical Exam  Constitutional: She is oriented to person, place, and time. No distress.  HENT:  Mouth/Throat: Oropharynx is clear and moist. No oropharyngeal exudate.  Eyes: Conjunctivae are normal. Right eye exhibits no discharge. Left eye exhibits no discharge. No scleral icterus.  Neck: Normal range of motion. Neck supple. No JVD present. No tracheal deviation present. No thyromegaly present.  Cardiovascular: Normal rate, regular rhythm, normal heart sounds and intact distal pulses.  Exam reveals no gallop and no friction rub.   No murmur heard. Pulmonary/Chest: Effort normal and  breath sounds normal. No respiratory distress. She has no wheezes. She has no rales. She exhibits no tenderness.  Abdominal: Soft. Bowel sounds are normal. She exhibits no distension and no mass. There is no hepatosplenomegaly. There is no tenderness. There is no rebound and no guarding.  Musculoskeletal: Normal range of motion. She exhibits no edema, tenderness or deformity.  Lymphadenopathy:    She has no cervical adenopathy.  Neurological: She is oriented to person, place, and time.  Skin: Skin is warm and dry. No  rash noted. She is not diaphoretic. No erythema. No pallor.  Vitals reviewed.   Lab Results  Component Value Date   WBC 7.1 03/16/2016   HGB 15.3 03/16/2016   HCT 45.3 (H) 03/16/2016   PLT 361 03/16/2016   GLUCOSE 86 03/16/2016   CHOL 225 (H) 07/13/2016   TRIG 219 (H) 07/13/2016   HDL 64 07/13/2016   LDLDIRECT 133.8 12/19/2012   LDLCALC 117 (H) 07/13/2016   ALT 41 (H) 08/16/2016   AST 36 08/16/2016   NA 138 03/16/2016   K 4.6 03/16/2016   CL 100 03/16/2016   CREATININE 0.75 03/16/2016   BUN 14 03/16/2016   CO2 29 03/16/2016   TSH 0.79 01/20/2016    Dg Chest 2 View  Result Date: 05/27/2015 CLINICAL DATA:  Cough and congestion for 7 days EXAM: CHEST  2 VIEW COMPARISON:  10/05/2013 FINDINGS: Cardiac shadow is stable. Aortic calcifications are again seen. Lungs are well aerated bilaterally. No focal infiltrate is seen. A mid thoracic compression deformity is again noted and stable. IMPRESSION: No acute abnormality seen. Electronically Signed   By: Inez Catalina M.D.   On: 05/27/2015 14:51    Assessment & Plan:   Carrie [name] was seen today for elevated hepatic enzymes.  Diagnoses and all orders for this visit:  Elevated LFTs- she has a very mildly elevated ALT at 41. Screening for active viral hepatitis is negative. She has positive immunity to hepatitis A. I will recommend that she get vaccinated against hepatitis B as she has no hepatitis B immunity. Screening for hepatitis C is negative. I have ordered an ultrasound to see if she has fatty liver disease. She doesn't take any medications that could be contributing to this. Finally, it's likely that the compression on her liver from kyphosis is contributing to this. I don't see any condition that needs treatment or intervention at this time. -     Hepatic function panel; Future -     Hepatitis B surface antigen; Future -     Hepatitis A antibody, total; Future -     Hepatitis B core antibody, total; Future -     Hepatitis B surface  antibody; Future -     Hepatitis C antibody; Future -     US Abdomen Complete; Future   I have discontinued [name]'s Coenzyme Q10. I am also having her maintain her fish oil-omega-3 fatty acids, nitroGLYCERIN, vitamin B-1, Vitamin D3, CALCIUM PO, ELIQUIS, and metoprolol succinate.  No orders of the defined types were placed in this encounter.    Follow-up: Return if symptoms worsen or fail to improve.  Scarlette Calico, MD

## 2016-08-16 NOTE — Progress Notes (Signed)
Pre visit review using our clinic review tool, if applicable. No additional management support is needed unless otherwise documented below in the visit note. 

## 2016-08-16 NOTE — Patient Instructions (Signed)
Fatty Liver Fatty liver, also called hepatic steatosis or steatohepatitis, is a condition in which too much fat has built up in your liver cells. The liver removes harmful substances from your bloodstream. It produces fluids your body needs. It also helps your body use and store energy from the food you eat. In many cases, fatty liver does not cause symptoms or problems. It is often diagnosed when tests are being done for other reasons. However, over time, fatty liver can cause inflammation that may lead to more serious liver problems, such as scarring of the liver (cirrhosis). What are the causes? Causes of fatty liver may include:  Drinking too much alcohol.  Poor nutrition.  Obesity.  Cushing syndrome.  Diabetes.  Hyperlipidemia.  Pregnancy.  Certain drugs.  Poisons.  Some viral infections. What increases the risk? You may be more likely to develop fatty liver if you:  Abuse alcohol.  Are pregnant.  Are overweight.  Have diabetes.  Have hepatitis.  Have a high triglyceride level. What are the signs or symptoms? Fatty liver often does not cause any symptoms. In cases where symptoms develop, they can include:  Fatigue.  Weakness.  Weight loss.  Confusion.  Abdominal pain.  Yellowing of your skin and the white parts of your eyes (jaundice).  Nausea and vomiting. How is this diagnosed? Fatty liver may be diagnosed by:  Physical exam and medical history.  Blood tests.  Imaging tests, such as an ultrasound, CT scan, or MRI.  Liver biopsy. A small sample of liver tissue is removed using a needle. The sample is then looked at under a microscope. How is this treated? Fatty liver is often caused by other health conditions. Treatment for fatty liver may involve medicines and lifestyle changes to manage conditions such as:  Alcoholism.  High cholesterol.  Diabetes.  Being overweight or obese. Follow these instructions at home:  Eat a healthy diet as  directed by your health care provider.  Exercise regularly. This can help you lose weight and control your cholesterol and diabetes. Talk to your health care provider about an exercise plan and which activities are best for you.  Do not drink alcohol.  Take medicines only as directed by your health care provider. Contact a health care provider if: You have difficulty controlling your:  Blood sugar.  Cholesterol.  Alcohol consumption. Get help right away if:  You have abdominal pain.  You have jaundice.  You have nausea and vomiting. This information is not intended to replace advice given to you by your health care provider. Make sure you discuss any questions you have with your health care provider. Document Released: 05/28/2005 Document Revised: 09/18/2015 Document Reviewed: 08/22/2013 Elsevier Interactive Patient Education  2017 [employer]  

## 2016-08-17 LAB — HEPATITIS B SURFACE ANTIGEN: HEP B S AG: NEGATIVE

## 2016-08-17 LAB — HEPATITIS B SURFACE ANTIBODY,QUALITATIVE: Hep B S Ab: NEGATIVE

## 2016-08-17 LAB — HEPATITIS B CORE ANTIBODY, TOTAL: Hep B Core Total Ab: NONREACTIVE

## 2016-08-17 LAB — HEPATITIS A ANTIBODY, TOTAL: Hep A Total Ab: REACTIVE — AB

## 2016-08-17 LAB — HEPATITIS C ANTIBODY: HCV AB: NEGATIVE

## 2016-08-18 NOTE — Addendum Note (Signed)
Addended by: Aviva Signs M on: 08/18/2016 01:23 PM   Modules accepted: Orders

## 2016-08-26 ENCOUNTER — Ambulatory Visit (INDEPENDENT_AMBULATORY_CARE_PROVIDER_SITE_OTHER): Payer: Medicare Other | Admitting: Nurse Practitioner

## 2016-08-26 ENCOUNTER — Encounter: Payer: Self-pay | Admitting: Nurse Practitioner

## 2016-08-26 ENCOUNTER — Ambulatory Visit
Admission: RE | Admit: 2016-08-26 | Discharge: 2016-08-26 | Disposition: A | Discharge status: Home / Self Care | Payer: Medicare Other | Source: Ambulatory Visit | Attending: Internal Medicine | Admitting: Internal Medicine

## 2016-08-26 VITALS — BP 130/70 | HR 75 | Temp 97.6°F | Ht 62.0 in | Wt 136.0 lb

## 2016-08-26 DIAGNOSIS — R945 Abnormal results of liver function studies: Principal | ICD-10-CM

## 2016-08-26 DIAGNOSIS — N3000 Acute cystitis without hematuria: Secondary | ICD-10-CM

## 2016-08-26 DIAGNOSIS — R7989 Other specified abnormal findings of blood chemistry: Secondary | ICD-10-CM

## 2016-08-26 DIAGNOSIS — K760 Fatty (change of) liver, not elsewhere classified: Secondary | ICD-10-CM | POA: Diagnosis not present

## 2016-08-26 LAB — POCT URINALYSIS DIPSTICK
Bilirubin, UA: NEGATIVE
Blood, UA: 10
Glucose, UA: NEGATIVE
Ketones, UA: NEGATIVE
Nitrite, UA: NEGATIVE
Protein, UA: NEGATIVE
Spec Grav, UA: >=1.03 — AB (ref 1.010–1.025)
Urobilinogen, UA: 0.2 E.U./dL
pH, UA: 6 (ref 5.0–8.0)

## 2016-08-26 MED ORDER — NITROFURANTOIN MONOHYD MACRO 100 MG PO CAPS: 100.0000 mg | ORAL_CAPSULE | Freq: Two times a day (BID) | ORAL | 0 refills | Status: DC

## 2016-08-26 MED ORDER — NITROFURANTOIN MONOHYD MACRO 100 MG PO CAPS: 100.0000 mg | ORAL_CAPSULE | Freq: Two times a day (BID) | ORAL | 0 refills | Status: AC

## 2016-08-26 NOTE — Patient Instructions (Signed)

## 2016-08-26 NOTE — Progress Notes (Signed)
Subjective:  Patient ID: Carrie [name], female    DOB: 1946/02/07  Age: 71 y.o. MRN: [id number]  CC: Urinary Tract Infection (frequent urinate going on for 2 days. )   Urinary Tract Infection   This is a new problem. The current episode started in the past 7 days. The problem occurs every urination. The problem has been unchanged. The quality of the pain is described as burning. There has been no fever. She is not sexually active. There is no history of pyelonephritis. Associated symptoms include frequency and urgency. Pertinent negatives include no chills, discharge, flank pain, hematuria, hesitancy, nausea, possible pregnancy, sweats or vomiting. She has tried increased fluids for the symptoms. The treatment provided no relief.    Outpatient Medications Prior to Visit  Medication Sig Dispense Refill  . CALCIUM PO Take by mouth daily.    . Cholecalciferol (VITAMIN D3) 1000 UNITS CAPS Take 1 capsule by mouth daily.      Marland Kitchen ELIQUIS 5 MG TABS tablet TAKE 1 TABLET (5 MG TOTAL) BY MOUTH 2 (TWO) TIMES DAILY. 60 tablet 10  . fish oil-omega-3 fatty acids 1000 MG capsule Take 1 g by mouth daily.      . metoprolol succinate (TOPROL-XL) 50 MG 24 hr tablet Take 1 tablet (50 mg total) by mouth daily. Take with or immediately following a meal. 90 tablet 1  . nitroGLYCERIN (NITROLINGUAL) 0.4 MG/SPRAY spray Place 1 spray under the tongue as directed.      . Thiamine HCl (VITAMIN B-1) 250 MG tablet Take 500 mg by mouth daily.       No facility-administered medications prior to visit.     ROS See HPI  Objective:  BP 130/70   Pulse 75   Temp 97.6 F (36.4 C)   Ht 5\' 2"  (1.575 m)   Wt 136 lb (61.7 kg)   SpO2 97%   BMI 24.87 kg/m   BP Readings from Last 3 Encounters:  08/26/16 130/70  08/16/16 140/70  07/16/16 130/72    Wt Readings from Last 3 Encounters:  08/26/16 136 lb (61.7 kg)  08/16/16 136 lb 4 oz (61.8 kg)  07/16/16 134 lb (60.8 kg)    Physical Exam  Constitutional: She is  oriented to person, place, and time. No distress.  Cardiovascular: Normal rate.   Pulmonary/Chest: Effort normal.  Abdominal: Soft. She exhibits no distension. There is no rebound and no guarding.  suprapubic tenderness  Musculoskeletal: She exhibits no edema.  Neurological: She is alert and oriented to person, place, and time.  Skin: Skin is warm and dry.  Vitals reviewed.   Lab Results  Component Value Date   WBC 7.1 03/16/2016   HGB 15.3 03/16/2016   HCT 45.3 (H) 03/16/2016   PLT 361 03/16/2016   GLUCOSE 86 03/16/2016   CHOL 225 (H) 07/13/2016   TRIG 219 (H) 07/13/2016   HDL 64 07/13/2016   LDLDIRECT 133.8 12/19/2012   LDLCALC 117 (H) 07/13/2016   ALT 41 (H) 08/16/2016   AST 36 08/16/2016   NA 138 03/16/2016   K 4.6 03/16/2016   CL 100 03/16/2016   CREATININE 0.75 03/16/2016   BUN 14 03/16/2016   CO2 29 03/16/2016   TSH 0.79 01/20/2016    US Abdomen Complete  Result Date: 08/26/2016 CLINICAL DATA:  Elevated LFTs EXAM: ABDOMEN ULTRASOUND COMPLETE COMPARISON:  None. FINDINGS: Gallbladder: No gallstones or wall thickening visualized. No sonographic Murphy sign noted by sonographer. Common bile duct: Diameter: Normal caliber, 2-3 mm Liver: Increased  echotexture compatible with fatty infiltration. No focal abnormality or biliary ductal dilatation. IVC: Visualized portions normal. Pancreas: Visualized portion unremarkable. Spleen: Size and appearance within normal limits. Right Kidney: Length: 10.8 cm. Echogenicity within normal limits. No mass or hydronephrosis visualized. Left Kidney: Length: 10.9 cm. Echogenicity within normal limits. No mass or hydronephrosis visualized. Abdominal aorta: No aneurysm visualized. Other findings: None. IMPRESSION: Fatty liver. No acute findings. Electronically Signed   By: Rolm Baptise M.D.   On: 08/26/2016 11:21    Assessment & Plan:   Carrie was seen today for urinary tract infection.  Diagnoses and all orders for this visit:  Acute  cystitis without hematuria -     POCT urinalysis dipstick -     Discontinue: nitrofurantoin, macrocrystal-monohydrate, (MACROBID) 100 MG capsule; Take 1 capsule (100 mg total) by mouth 2 (two) times daily. -     nitrofurantoin, macrocrystal-monohydrate, (MACROBID) 100 MG capsule; Take 1 capsule (100 mg total) by mouth 2 (two) times daily.   I am having [name] maintain her fish oil-omega-3 fatty acids, nitroGLYCERIN, vitamin B-1, Vitamin D3, CALCIUM PO, ELIQUIS, metoprolol succinate, and nitrofurantoin (macrocrystal-monohydrate).  Meds ordered this encounter  Medications  . DISCONTD: nitrofurantoin, macrocrystal-monohydrate, (MACROBID) 100 MG capsule    Sig: Take 1 capsule (100 mg total) by mouth 2 (two) times daily.    Dispense:  14 capsule    Refill:  0    Order Specific Question:   Supervising Provider    Answer:   Cassandria Anger [[id number]]  . nitrofurantoin, macrocrystal-monohydrate, (MACROBID) 100 MG capsule    Sig: Take 1 capsule (100 mg total) by mouth 2 (two) times daily.    Dispense:  10 capsule    Refill:  0    Order Specific Question:   Supervising Provider    Answer:   Cassandria Anger [[id number]]    Follow-up: Return if symptoms worsen or fail to improve.  Wilfred Lacy, NP

## 2016-08-26 NOTE — Progress Notes (Signed)
Pre visit review using our clinic review tool, if applicable. No additional management support is needed unless otherwise documented below in the visit note. 

## 2016-09-03 ENCOUNTER — Encounter: Payer: Self-pay | Admitting: Nurse Practitioner

## 2016-09-03 ENCOUNTER — Other Ambulatory Visit: Payer: Medicare Other

## 2016-09-03 ENCOUNTER — Ambulatory Visit (INDEPENDENT_AMBULATORY_CARE_PROVIDER_SITE_OTHER): Payer: Medicare Other | Admitting: Nurse Practitioner

## 2016-09-03 VITALS — BP 130/72 | HR 72 | Temp 97.8°F | Ht 62.0 in | Wt 136.0 lb

## 2016-09-03 DIAGNOSIS — R10819 Abdominal tenderness, unspecified site: Secondary | ICD-10-CM

## 2016-09-03 LAB — POCT URINALYSIS DIPSTICK
Bilirubin, UA: NEGATIVE
Glucose, UA: NEGATIVE
Ketones, UA: NEGATIVE
LEUKOCYTES UA: NEGATIVE
NITRITE UA: NEGATIVE
PH UA: 6 (ref 5.0–8.0)
PROTEIN UA: NEGATIVE
RBC UA: NEGATIVE
Spec Grav, UA: >=1.03 — AB (ref 1.010–1.025)
UROBILINOGEN UA: 0.2 U/dL

## 2016-09-03 NOTE — Progress Notes (Signed)
Subjective:  Patient ID: Carrie [name], female    DOB: 1945-05-01  Age: 71 y.o. MRN: [id number]  CC: Bloated (feel full, no burning,still has frequent urination but not as much as before. )   Abdominal Pain  This is a new problem. The current episode started yesterday. The onset quality is gradual. The problem occurs constantly. The problem has been unchanged. The pain is located in the suprapubic region. The quality of the pain is a sensation of fullness. The abdominal pain does not radiate. Pertinent negatives include no anorexia, belching, constipation, diarrhea, dysuria, hematochezia, hematuria, melena, nausea, vomiting or weight loss. The pain is aggravated by palpation. The pain is relieved by nothing. She has tried nothing for the symptoms. Her past medical history is significant for GERD.  improvement in urinary symptoms while taking oral abx for UTI.  Outpatient Medications Prior to Visit  Medication Sig Dispense Refill  . CALCIUM PO Take by mouth daily.    . Cholecalciferol (VITAMIN D3) 1000 UNITS CAPS Take 1 capsule by mouth daily.      Marland Kitchen ELIQUIS 5 MG TABS tablet TAKE 1 TABLET (5 MG TOTAL) BY MOUTH 2 (TWO) TIMES DAILY. 60 tablet 10  . fish oil-omega-3 fatty acids 1000 MG capsule Take 1 g by mouth daily.      . metoprolol succinate (TOPROL-XL) 50 MG 24 hr tablet Take 1 tablet (50 mg total) by mouth daily. Take with or immediately following a meal. 90 tablet 1  . nitroGLYCERIN (NITROLINGUAL) 0.4 MG/SPRAY spray Place 1 spray under the tongue as directed.      . Thiamine HCl (VITAMIN B-1) 250 MG tablet Take 500 mg by mouth daily.       No facility-administered medications prior to visit.     ROS See HPI  Objective:  BP 130/72   Pulse 72   Temp 97.8 F (36.6 C)   Ht 5\' 2"  (1.575 m)   Wt 136 lb (61.7 kg)   SpO2 98%   BMI 24.87 kg/m   BP Readings from Last 3 Encounters:  09/03/16 130/72  08/26/16 130/70  08/16/16 140/70    Wt Readings from Last 3 Encounters:    09/03/16 136 lb (61.7 kg)  08/26/16 136 lb (61.7 kg)  08/16/16 136 lb 4 oz (61.8 kg)    Physical Exam  Constitutional: She is oriented to person, place, and time.  Cardiovascular: Normal rate.   Pulmonary/Chest: Effort normal.  Abdominal: Soft. Bowel sounds are normal. She exhibits no distension and no mass. There is tenderness. There is no rebound and no guarding.  Suprapubic tenderness. No CVA tenderness  Musculoskeletal: Normal range of motion. She exhibits no edema.  Neurological: She is alert and oriented to person, place, and time.  Skin: Skin is warm and dry.  Vitals reviewed.   Lab Results  Component Value Date   WBC 7.1 03/16/2016   HGB 15.3 03/16/2016   HCT 45.3 (H) 03/16/2016   PLT 361 03/16/2016   GLUCOSE 86 03/16/2016   CHOL 225 (H) 07/13/2016   TRIG 219 (H) 07/13/2016   HDL 64 07/13/2016   LDLDIRECT 133.8 12/19/2012   LDLCALC 117 (H) 07/13/2016   ALT 41 (H) 08/16/2016   AST 36 08/16/2016   NA 138 03/16/2016   K 4.6 03/16/2016   CL 100 03/16/2016   CREATININE 0.75 03/16/2016   BUN 14 03/16/2016   CO2 29 03/16/2016   TSH 0.79 01/20/2016    US Abdomen Complete  Result Date: 08/26/2016 CLINICAL DATA:  Elevated LFTs EXAM: ABDOMEN ULTRASOUND COMPLETE COMPARISON:  None. FINDINGS: Gallbladder: No gallstones or wall thickening visualized. No sonographic Murphy sign noted by sonographer. Common bile duct: Diameter: Normal caliber, 2-3 mm Liver: Increased echotexture compatible with fatty infiltration. No focal abnormality or biliary ductal dilatation. IVC: Visualized portions normal. Pancreas: Visualized portion unremarkable. Spleen: Size and appearance within normal limits. Right Kidney: Length: 10.8 cm. Echogenicity within normal limits. No mass or hydronephrosis visualized. Left Kidney: Length: 10.9 cm. Echogenicity within normal limits. No mass or hydronephrosis visualized. Abdominal aorta: No aneurysm visualized. Other findings: None. IMPRESSION: Fatty liver. No  acute findings. Electronically Signed   By: Rolm Baptise M.D.   On: 08/26/2016 11:21    Assessment & Plan:   Catlin was seen today for bloated.  Diagnoses and all orders for this visit:  Suprapubic tenderness -     Cancel: Urinalysis, Routine w reflex microscopic; Future -     Urine culture; Future -     POCT urinalysis dipstick   I am having Carrie [name] maintain her fish oil-omega-3 fatty acids, nitroGLYCERIN, vitamin B-1, Vitamin D3, CALCIUM PO, ELIQUIS, and metoprolol succinate.  No orders of the defined types were placed in this encounter.   Follow-up: Return if symptoms worsen or fail to improve.  Wilfred Lacy, NP

## 2016-09-03 NOTE — Patient Instructions (Signed)
Encourage adequate oral hydration.  You will be contacted with urine culture results.

## 2016-09-05 LAB — URINE CULTURE

## 2016-09-07 ENCOUNTER — Ambulatory Visit (INDEPENDENT_AMBULATORY_CARE_PROVIDER_SITE_OTHER): Payer: Medicare Other | Admitting: Internal Medicine

## 2016-09-07 ENCOUNTER — Encounter: Payer: Self-pay | Admitting: Internal Medicine

## 2016-09-07 VITALS — BP 134/76 | HR 63 | Temp 98.2°F | Resp 16 | Wt 135.0 lb

## 2016-09-07 DIAGNOSIS — L237 Allergic contact dermatitis due to plants, except food: Secondary | ICD-10-CM | POA: Diagnosis not present

## 2016-09-07 MED ORDER — PREDNISONE 20 MG PO TABS: ORAL_TABLET | ORAL | 0 refills | Status: DC

## 2016-09-07 MED ORDER — METHYLPREDNISOLONE ACETATE 40 MG/ML IJ SUSP
40.0000 mg | Freq: Once | INTRAMUSCULAR | Status: AC
  Administered 2016-09-07: 40 mg via INTRAMUSCULAR

## 2016-09-07 NOTE — Patient Instructions (Signed)
You received a steroid injection today.  Start the prednisone taper tomorrow morning.  Take with food. Continue the topical medication. You can take an anti-histamine for itching if needed.    If you have any side effects or questions please call.      Poison Ivy Dermatitis Poison ivy dermatitis is inflammation of the skin that is caused by the allergens on the leaves of the poison ivy plant. The skin reaction often involves redness, swelling, blisters, and extreme itching. What are the causes? This condition is caused by a specific chemical (urushiol) found in the sap of the poison ivy plant. This chemical is sticky and can be easily spread to people, animals, and objects. You can get poison ivy dermatitis by:  Having direct contact with a poison ivy plant.  Touching animals, other people, or objects that have come in contact with poison ivy and have the chemical on them. What increases the risk? This condition is more likely to develop in:  People who are outdoors often.  People who go outdoors without wearing protective clothing, such as closed shoes, long pants, and a long-sleeved shirt. What are the signs or symptoms? Symptoms of this condition include:  Redness and itching.  A rash that often includes bumps and blisters. The rash usually appears 48 hours after exposure.  Swelling. This may occur if the reaction is more severe. Symptoms usually last for 1-2 weeks. However, the first time you develop this condition, symptoms may last 3-4 weeks. How is this diagnosed? This condition may be diagnosed based on your symptoms and a physical exam. Your health care provider may also ask you about any recent outdoor activity. How is this treated? Treatment for this condition will vary depending on how severe it is. Treatment may include:  Hydrocortisone creams or calamine lotions to relieve itching.  Oatmeal baths to soothe the skin.  Over-the-counter antihistamine  tablets.  Oral steroid medicine for more severe outbreaks. Follow these instructions at home:  Take or apply over-the-counter and prescription medicines only as told by your health care provider.  Wash exposed skin as soon as possible with soap and cold water.  Use hydrocortisone creams or calamine lotion as needed to soothe the skin and relieve itching.  Take oatmeal baths as needed. Use colloidal oatmeal. You can get this at your local pharmacy or grocery store. Follow the instructions on the packaging.  Do not scratch or rub your skin.  While you have the rash, wash clothes right after you wear them. How is this prevented?  Learn to identify the poison ivy plant and avoid contact with the plant. This plant can be recognized by the number of leaves. Generally, poison ivy has three leaves with flowering branches on a single stem. The leaves are typically glossy, and they have jagged edges that come to a point at the front.  If you have been exposed to poison ivy, thoroughly wash with soap and water right away. You have about 30 minutes to remove the plant resin before it will cause the rash. Be sure to wash under your fingernails because any plant resin there will continue to spread the rash.  When hiking or camping, wear clothes that will help you to avoid exposure on the skin. This includes long pants, a long-sleeved shirt, tall socks, and hiking boots. You can also apply preventive lotion to your skin to help limit exposure.  If you suspect that your clothes or outdoor gear came in contact with poison ivy, rinse  them off outside with a garden hose before you bring them inside your house. Contact a health care provider if:  You have open sores in the rash area.  You have more redness, swelling, or pain in the affected area.  You have redness that spreads beyond the rash area.  You have fluid, blood, or pus coming from the affected area.  You have a fever.  You have a rash over  a large area of your body.  You have a rash on your eyes, mouth, or genitals.  Your rash does not improve after a few days. Get help right away if:  Your face swells or your eyes swell shut.  You have trouble breathing.  You have trouble swallowing. This information is not intended to replace advice given to you by your health care provider. Make sure you discuss any questions you have with your health care provider. Document Released: 04/09/2000 Document Revised: 09/18/2015 Document Reviewed: 09/18/2014 Elsevier Interactive Patient Education  2017 [employer].

## 2016-09-07 NOTE — Assessment & Plan Note (Signed)
Rash is consistent with poison ivy/oak/sumac or something similar Will give depo-medrol 40 mg IM x 1 today Prednisone taper starting tomorrow - will try quick taper given OP Continue oral anti-histamine Ok to continue topical creams Call if no improvement

## 2016-09-07 NOTE — Addendum Note (Signed)
Addended by: Terence Lux B on: 09/07/2016 11:06 AM   Modules accepted: Orders

## 2016-09-07 NOTE — Progress Notes (Signed)
Subjective:    Patient ID: Carrie [name], female    DOB: 12/01/1945, 71 y.o.   MRN: [id number]  HPI She is here for an acute visit.   Rash: She mowed three days ago.  She did pick up some pine needles but did not weed.  She first noticed a rash on her face yesterday. It is incredibly itchy.  She has a rash on both sides of her face, her left arm and left leg. The rash has spread since if first started.  She has applied clear calamine and steroid cream.  The topical creams are not working well enough.  She has taken an anti-histamine a couple of times.    She has had similar episodes in the past.  The itching is driving her crazy.    Medications and allergies reviewed with patient and updated if appropriate.  Patient Active Problem List   Diagnosis Date Noted  . Elevated LFTs 08/16/2016  . Paroxysmal atrial flutter ([place]) 07/08/2016  . Urinary urgency 11/21/2015  . Varicose veins of lower extremities with other complications 82/50/5397  . Allergic rhinitis 12/28/2012  . OAB (overactive bladder) 06/22/2012  . Routine general medical examination at a health care facility 06/22/2012  . Aortic stenosis 09/05/2010  . Coronary atherosclerosis 06/26/2009  . SUPRAVENTRICULAR TACHYCARDIA 06/23/2009  . Hyperlipidemia with target LDL less than 160 03/06/2009  . Osteoporosis 03/06/2009    Current Outpatient Prescriptions on File Prior to Visit  Medication Sig Dispense Refill  . CALCIUM PO Take by mouth daily.    . Cholecalciferol (VITAMIN D3) 1000 UNITS CAPS Take 1 capsule by mouth daily.      Marland Kitchen ELIQUIS 5 MG TABS tablet TAKE 1 TABLET (5 MG TOTAL) BY MOUTH 2 (TWO) TIMES DAILY. 60 tablet 10  . fish oil-omega-3 fatty acids 1000 MG capsule Take 1 g by mouth daily.      . metoprolol succinate (TOPROL-XL) 50 MG 24 hr tablet Take 1 tablet (50 mg total) by mouth daily. Take with or immediately following a meal. 90 tablet 1  . nitroGLYCERIN (NITROLINGUAL) 0.4 MG/SPRAY spray Place 1 spray under the  tongue as directed.      . Thiamine HCl (VITAMIN B-1) 250 MG tablet Take 500 mg by mouth daily.       No current facility-administered medications on file prior to visit.     Past Medical History:  Diagnosis Date  . Aortic stenosis   . Contact lens/glasses fitting    contacts or gl;asses  . Coronary artery disease   . Osteoporosis, unspecified   . Other and unspecified hyperlipidemia   . Other specified cardiac dysrhythmias(427.89)   . Paroxysmal atrial flutter ([place])   . Undiagnosed cardiac murmurs   . Wears hearing aid    left    Past Surgical History:  Procedure Laterality Date  . ATRIAL ABLATION SURGERY  2005  . BREAST SURGERY  1982   Benign tumor removed RT  . BUNIONECTOMY  04/07/2012   Procedure: Lillard Anes;  Surgeon: Alta Corning, MD;  Location: [facility];  Service: Orthopedics;  Laterality: Left;  CHEVRON OSTEOTOMY LEFT FOOT   . CARDIAC CATHETERIZATION  2005  . GANGLION CYST EXCISION     left wrist  . HERNIA REPAIR  1990   rt ing  . TONSILLECTOMY    . TUBAL LIGATION      Social History   Social History  . Marital status: Divorced    Spouse name: N/A  . Number  of children: N/A  . Years of education: N/A   Social History Main Topics  . Smoking status: Former Smoker    Quit date: 04/05/1999  . Smokeless tobacco: Never Used     Comment: Regular Exercise-No  . Alcohol use 4.2 oz/week    7 Glasses of wine per week  . Drug use: No  . Sexual activity: Not Currently   Other Topics Concern  . Not on file   Social History Narrative  . No narrative on file    Family History  Problem Relation Age of Onset  . Heart disease Mother 38       CABG and valve replacement; CHF  . Congestive Heart Failure Mother   . Hypertension Other     Review of Systems  Constitutional: Negative for fever.  HENT: Positive for trouble swallowing.   Eyes: Negative for visual disturbance.  Respiratory: Negative for cough, shortness of breath and wheezing.         Objective:   Vitals:   09/07/16 1036  BP: 134/76  Pulse: 63  Resp: 16  Temp: 98.2 F (36.8 C)   Filed Weights   09/07/16 1036  Weight: 135 lb (61.2 kg)   Body mass index is 24.69 kg/m.  Wt Readings from Last 3 Encounters:  09/07/16 135 lb (61.2 kg)  09/03/16 136 lb (61.7 kg)  08/26/16 136 lb (61.7 kg)     Physical Exam  Constitutional: She appears well-developed and well-nourished. No distress.  HENT:  Head: Normocephalic and atraumatic.  Mouth/Throat: Oropharynx is clear and moist.  Eyes: Conjunctivae are normal.  Musculoskeletal: She exhibits no edema.  Skin: She is diaphoretic.  Patches of mildly raised erythema on face, arm and leg; largest patch on left cheek; no blisters, open wounds or discharge          Assessment & Plan:   See Problem List for Assessment and Plan of chronic medical problems.

## 2016-09-08 ENCOUNTER — Telehealth: Payer: Self-pay

## 2016-09-08 NOTE — Telephone Encounter (Signed)
Advised patient that she is due for prolia injection at her earliest convenience---estimated $220 copay after her $61 deductible is met---she will only have to be responsible for $220 copay here at our office---however, patient was just seen yesterday for dermatitis and placed on steroid therapy---she will need to wait 6 weeks after therapy is stopped in order for prolia to work the best---patient will call back to schedule nurse visit at that time

## 2016-09-09 DIAGNOSIS — L239 Allergic contact dermatitis, unspecified cause: Secondary | ICD-10-CM | POA: Diagnosis not present

## 2016-09-13 ENCOUNTER — Ambulatory Visit (INDEPENDENT_AMBULATORY_CARE_PROVIDER_SITE_OTHER): Payer: Medicare Other | Admitting: Pharmacist

## 2016-09-13 DIAGNOSIS — E785 Hyperlipidemia, unspecified: Secondary | ICD-10-CM | POA: Diagnosis not present

## 2016-09-13 DIAGNOSIS — I4891 Unspecified atrial fibrillation: Secondary | ICD-10-CM

## 2016-09-13 LAB — LIPID PANEL
CHOL/HDL RATIO: 3.8 ratio (ref 0.0–4.4)
Cholesterol, Total: 281 mg/dL — ABNORMAL HIGH (ref 100–199)
HDL: 73 mg/dL (ref >39)
LDL Calculated: 160 mg/dL — ABNORMAL HIGH (ref 0–99)
Triglycerides: 238 mg/dL — ABNORMAL HIGH (ref 0–149)
VLDL CHOLESTEROL CAL: 48 mg/dL — AB (ref 5–40)

## 2016-09-13 LAB — CBC
HEMATOCRIT: 43.8 % (ref 34.0–46.6)
Hemoglobin: 14.9 g/dL (ref 11.1–15.9)
MCH: 30.8 pg (ref 26.6–33.0)
MCHC: 34 g/dL (ref 31.5–35.7)
MCV: 91 fL (ref 79–97)
Platelets: 396 10*3/uL — ABNORMAL HIGH (ref 150–379)
RBC: 4.84 x10E6/uL (ref 3.77–5.28)
RDW: 13 % (ref 12.3–15.4)
WBC: 9.1 10*3/uL (ref 3.4–10.8)

## 2016-09-13 LAB — BASIC METABOLIC PANEL
BUN/Creatinine Ratio: 29 — ABNORMAL HIGH (ref 12–28)
BUN: 19 mg/dL (ref 8–27)
CO2: 21 mmol/L (ref 18–29)
CREATININE: 0.65 mg/dL (ref 0.57–1.00)
Calcium: 9.7 mg/dL (ref 8.7–10.3)
Chloride: 101 mmol/L (ref 96–106)
GFR calc Af Amer: 103 mL/min/{1.73_m2} (ref >59)
GFR calc non Af Amer: 90 mL/min/{1.73_m2} (ref >59)
GLUCOSE: 99 mg/dL (ref 65–99)
Potassium: 4.2 mmol/L (ref 3.5–5.2)
Sodium: 136 mmol/L (ref 134–144)

## 2016-09-13 LAB — HEPATIC FUNCTION PANEL
ALT: 42 IU/L — ABNORMAL HIGH (ref 0–32)
AST: 28 IU/L (ref 0–40)
Albumin: 4 g/dL (ref 3.5–4.8)
Alkaline Phosphatase: 91 IU/L (ref 39–117)
Bilirubin Total: 0.5 mg/dL (ref 0.0–1.2)
Bilirubin, Direct: 0.12 mg/dL (ref 0.00–0.40)
TOTAL PROTEIN: 7.7 g/dL (ref 6.0–8.5)

## 2016-09-13 NOTE — Progress Notes (Signed)
Pt was started on Eliquis 5mg  twice a day for Atrial Flutter with RVR on 02/12/16 per Dr. Aundra Dubin. Per Dr. Aundra Dubin progress note on 02/12/16 pt will follow-up with Dr. Saunders Revel and he will take over as pt primary Cardiologist.   Pt asks about checking her cholesterol and LFTs as her LFTs have previously been elevated. She was advised to f/u with her PCP who tested pt for hepatitis. Pt came back negative and does not have further workup scheduled. See has been off her statin for a few months. Denies excessive alcohol or Tylenol use. Will check LFTs and lipid panel today since next f/u with Dr End is not scheduled until September.  Reviewed patients medication list.  Pt is not currently on any combined P-gp and strong CYP3A4 inhibitors/inducers (ketoconazole, traconazole, ritonavir, carbamazepine, phenytoin, rifampin, St. John's wort).  Reviewed labs.  SCr 0.65, Weight 133 lb 12 oz, age 71 years.  Dose appropriate based on age, weight, and SCr.  Hgb and HCT both wnl at 14.9 and 43.8, respectively.  A full discussion of the nature of anticoagulants has been carried out.  A benefit/risk analysis has been presented to the patient, so that they understand the justification for choosing anticoagulation with Eliquis at this time.  The need for compliance is stressed.  Pt is aware to take the medication twice daily.  Side effects of potential bleeding are discussed, including unusual colored urine or stools, coughing up blood or coffee ground emesis, nose bleeds or serious fall or head trauma.  Discussed signs and symptoms of stroke. The patient should avoid any OTC items containing aspirin or ibuprofen.  Avoid alcohol consumption.   Call if any signs of abnormal bleeding.  Discussed financial obligations and resolved any difficulty in obtaining medication.

## 2016-09-14 ENCOUNTER — Telehealth: Payer: Self-pay | Admitting: Pharmacist

## 2016-09-14 NOTE — Telephone Encounter (Signed)
-----   Message from Nelva Bush, MD sent at 09/14/2016  9:33 AM EDT ----- I think a statin rechallenge is fine, particularly since the ALT elevation is minimal. Thanks for your help.  Gerald Stabs  ----- Message ----- From: Leeroy Bock, [facility] Sent: 09/14/2016   7:13 AM To: Nelva Bush, MD  Sounds good, I'll give her a call today. Her ALT has remained stable off of statin therapy, are you comfortable with a statin rechallenge with close follow up of LFTs?  Hensley Aziz  ----- Message ----- From: Nelva Bush, MD Sent: 09/14/2016   6:49 AM To: [place], RPH  Hi Gustavia Carie,  Thanks for your message. It looks like her LDL is quite elevated now that she has been off treatment for a couple of months. If you are able to see her in the lipid clinic, that would be great. Thank you for your help.  Gerald Stabs  ----- Message ----- From: Leeroy Bock, [facility] Sent: 09/13/2016  10:35 AM To: Nelva Bush, MD  FYI - pt in today for 6 month Eliquis follow up. She had a few questions regarding her LFTs which have previously been elevated. I'm checking a lipid panel and LFTs today in addition to usual Eliquis monitoring labs. She has been off of her statin for a few months and further workup with PCP did not reveal anything unusual. Her next follow up with you is not scheduled until September. I am more than happy to see her in lipid clinic as well pending her lipid panel results from today. Thanks, [employer]

## 2016-09-14 NOTE — Telephone Encounter (Signed)
Pt scheduled in lipid clinic this week.

## 2016-09-16 ENCOUNTER — Encounter: Payer: Self-pay | Admitting: *Deleted

## 2016-09-16 ENCOUNTER — Encounter: Payer: Self-pay | Admitting: Pharmacist

## 2016-09-16 ENCOUNTER — Ambulatory Visit (INDEPENDENT_AMBULATORY_CARE_PROVIDER_SITE_OTHER): Payer: Medicare Other | Admitting: Pharmacist

## 2016-09-16 VITALS — BP 126/68 | HR 88 | Ht 61.0 in | Wt 132.0 lb

## 2016-09-16 DIAGNOSIS — E785 Hyperlipidemia, unspecified: Secondary | ICD-10-CM

## 2016-09-16 DIAGNOSIS — Z006 Encounter for examination for normal comparison and control in clinical research program: Secondary | ICD-10-CM

## 2016-09-16 MED ORDER — ROSUVASTATIN CALCIUM 10 MG PO TABS: 10.0000 mg | ORAL_TABLET | Freq: Every day | ORAL | 3 refills | Status: DC

## 2016-09-16 NOTE — Patient Instructions (Addendum)
It was great to see you today!  Please start taking Crestor 10mg  daily.  Start silver sneakers, or walk at least 30 minutes every other day.  Keep up your healthy diet.  Follow up with liver enzyme check in 1 month on Monday June 25th  Recheck cholesterol in 2-3 months on Monday August 6th - fasting labs

## 2016-09-16 NOTE — Progress Notes (Signed)
Subject met inclusion and exclusion criteria. The informed consent form, study requirements and expectations were reviewed with the subject and questions and concerns were addressed prior to the signing of the consent form. The subject verbalized understanding of the trail requirements. The subject agreed to participate in the [place] Registryand signed the informed consent. The informed consent was obtained prior to performance of any protocol-specific procedures for the subject. A copy of the signed informed consent was given to the subject and a copy was placed in the subject's medical record.  Jake Bathe, RN Sep 16, 2016

## 2016-09-16 NOTE — Progress Notes (Signed)
Patient ID: Carrie [name]                 DOB: [birth year]                    MRN: [id number]     HPI: TRISTIAN BOUSKA is a 71 y.o. female patient referred to lipid clinic by End. Pt has PMH of non-obstructive coronary artery disease, paroxysmal atrial flutter, aortic stenosis (moderate by echo in 01/2016), and mildly elevated LFTs. LHC from 06/2003 showed LMCA heavily calcified, LAD with heavy calcification proximally, 50-60% stenosis distal to D2, and dominant RCA with moderate proximal calcification. She presents today for further lipid management.  Pt was previously on Crestor 20mg  daily + Zetia, although patient's adherence was affected due to irregular stress in her life. She stopped taking statin therapy and her LDL and ALT were found to be elevated. Pt was referred to PCP to rule out alternative causes for LFT elevation. No other causes noted, pt negative for Hepatitis. ALT remains stable in the low 40s, even off of statin therapy. Pt is interested in resuming statin therapy and starting back on an exercise program.   Pt presented with no complaints. She stated as soon as she walked in that she would like to get on oral cholesterol medication as soon as possible if her labs were okay. She has been working hard to walk more and is planning of joining the silver sneakers program.  Current Medications: fish oil 1g daily Intolerances: Crestor 40mg  (fatigue, exhaustion) - tolerated 20mg  Previously tried medications: simvastatin 80mg , Crestor 40mg  (exhaustion), Lipitor 40mg  daily, Lipitor 80mg  daily, Zetia 10mg  (stopped ? LFT potentially) - pt not sure why she was switched from Lipitor, pt said simvastatin 80mg  was stopped because it wasn't being used anymore Risk Factors: CAD and age > 77 LDL goal: < 70mg /dL  Diet: breakfast: cup of coffee with half/half and yogurt, lunch/supper is always in combination - grilled foods usually chicken and salmon. In the evening may have a bowl of cereal with 2%  milk. No fast food.  Exercise: Works around [employer], yard work, and walks a little outside. Goal is to start silver sneakers.  Family History: mother - CABG, valve replacement, CHF, HTN  Social History: 1 glass of wine per week  Labs: 09/13/2016: TC 281, TG 238, HDL 73, LDL 160 - ALT 42 (baseline) 08/16/2016: ALT 41 07/13/2016: TC 225, TG 219, HDL 64, LDL 117 - ALT 57 (off of Crestor and Zetia for at least 6 weeks) 08/18/2015: TG 112, HDL 84, LDL 85 (on Crestor 20mg  and Zetia 10mg  daily)  Past Medical History:  Diagnosis Date  . Aortic stenosis   . Contact lens/glasses fitting    contacts or gl;asses  . Coronary artery disease   . Osteoporosis, unspecified   . Other and unspecified hyperlipidemia   . Other specified cardiac dysrhythmias(427.89)   . Paroxysmal atrial flutter ([place])   . Undiagnosed cardiac murmurs   . Wears hearing aid    left    Current Outpatient Prescriptions on File Prior to Visit  Medication Sig Dispense Refill  . CALCIUM PO Take by mouth daily.    . Cholecalciferol (VITAMIN D3) 1000 UNITS CAPS Take 1 capsule by mouth daily.      Marland Kitchen ELIQUIS 5 MG TABS tablet TAKE 1 TABLET (5 MG TOTAL) BY MOUTH 2 (TWO) TIMES DAILY. 60 tablet 10  . fish oil-omega-3 fatty acids 1000 MG capsule Take 1 g by mouth  daily.      . metoprolol succinate (TOPROL-XL) 50 MG 24 hr tablet Take 1 tablet (50 mg total) by mouth daily. Take with or immediately following a meal. 90 tablet 1  . nitroGLYCERIN (NITROLINGUAL) 0.4 MG/SPRAY spray Place 1 spray under the tongue as directed.      . predniSONE (DELTASONE) 20 MG tablet Take two pills daily for 2 days, 1 tab daily for 2 days, 1/2 tab daily for 2 days 7 tablet 0  . Thiamine HCl (VITAMIN B-1) 250 MG tablet Take 500 mg by mouth daily.       No current facility-administered medications on file prior to visit.     Allergies  Allergen Reactions  . Codeine Nausea And Vomiting  . Crestor [Rosuvastatin] Other (See Comments)    Crestor 40mg   fatigue and exhaustion--pt tolerates crestor 20mg     Assessment/Plan:  1. Dyslipidemia - LDL currently above goal < 70mg /dL. LDL was previously 85 on Crestor 20mg  and Zetia 10mg , however there was question of statin-induced LFT elevation. After statin washout, LDL has increased to 160 and ALT has remained stable in the low 40s. Will resume lower dose of Crestor 10mg  daily with repeat LFTs in 1 month, Dr End is in agreement with resuming statin therapy. Will follow up in 3 months for repeat lipid panel and LFTs.  Pt signed informed consent for [facility] lipid registry.  Pt seen with Cruz Condon, PGY2 pharmacy resident.  Megan E. Supple, PharmD, CPP, [facility] [address], [place], [place] [address] Phone: [phone]; Fax: [phone]

## 2016-10-04 DIAGNOSIS — H2513 Age-related nuclear cataract, bilateral: Secondary | ICD-10-CM | POA: Diagnosis not present

## 2016-10-04 DIAGNOSIS — H524 Presbyopia: Secondary | ICD-10-CM | POA: Diagnosis not present

## 2016-10-18 ENCOUNTER — Other Ambulatory Visit: Payer: Medicare Other

## 2016-10-18 ENCOUNTER — Ambulatory Visit (INDEPENDENT_AMBULATORY_CARE_PROVIDER_SITE_OTHER): Payer: Medicare Other | Admitting: Internal Medicine

## 2016-10-18 ENCOUNTER — Encounter: Payer: Self-pay | Admitting: Internal Medicine

## 2016-10-18 VITALS — BP 128/82 | HR 73 | Temp 98.0°F | Resp 16 | Ht 62.0 in | Wt 137.0 lb

## 2016-10-18 DIAGNOSIS — E785 Hyperlipidemia, unspecified: Secondary | ICD-10-CM

## 2016-10-18 DIAGNOSIS — L237 Allergic contact dermatitis due to plants, except food: Secondary | ICD-10-CM

## 2016-10-18 LAB — HEPATIC FUNCTION PANEL
ALBUMIN: 4.1 g/dL (ref 3.5–4.8)
ALT: 44 IU/L — ABNORMAL HIGH (ref 0–32)
AST: 43 IU/L — ABNORMAL HIGH (ref 0–40)
Alkaline Phosphatase: 109 IU/L (ref 39–117)
BILIRUBIN TOTAL: 0.5 mg/dL (ref 0.0–1.2)
BILIRUBIN, DIRECT: 0.14 mg/dL (ref 0.00–0.40)
TOTAL PROTEIN: 7.1 g/dL (ref 6.0–8.5)

## 2016-10-18 MED ORDER — METHYLPREDNISOLONE ACETATE 80 MG/ML IJ SUSP
120.0000 mg | Freq: Once | INTRAMUSCULAR | Status: AC
  Administered 2016-10-18: 120 mg via INTRAMUSCULAR

## 2016-10-18 MED ORDER — CLOBETASOL PROPIONATE 0.05 % EX OINT: 1.0000 "application " | TOPICAL_OINTMENT | Freq: Two times a day (BID) | CUTANEOUS | 0 refills | Status: DC

## 2016-10-18 NOTE — Patient Instructions (Signed)
Poison Ivy Dermatitis Poison ivy dermatitis is inflammation of the skin that is caused by the allergens on the leaves of the poison ivy plant. The skin reaction often involves redness, swelling, blisters, and extreme itching. What are the causes? This condition is caused by a specific chemical (urushiol) found in the sap of the poison ivy plant. This chemical is sticky and can be easily spread to people, animals, and objects. You can get poison ivy dermatitis by:  Having direct contact with a poison ivy plant.  Touching animals, other people, or objects that have come in contact with poison ivy and have the chemical on them.  What increases the risk? This condition is more likely to develop in:  People who are outdoors often.  People who go outdoors without wearing protective clothing, such as closed shoes, long pants, and a long-sleeved shirt.  What are the signs or symptoms? Symptoms of this condition include:  Redness and itching.  A rash that often includes bumps and blisters. The rash usually appears 48 hours after exposure.  Swelling. This may occur if the reaction is more severe.  Symptoms usually last for 1-2 weeks. However, the first time you develop this condition, symptoms may last 3-4 weeks. How is this diagnosed? This condition may be diagnosed based on your symptoms and a physical exam. Your health care provider may also ask you about any recent outdoor activity. How is this treated? Treatment for this condition will vary depending on how severe it is. Treatment may include:  Hydrocortisone creams or calamine lotions to relieve itching.  Oatmeal baths to soothe the skin.  Over-the-counter antihistamine tablets.  Oral steroid medicine for more severe outbreaks.  Follow these instructions at home:  Take or apply over-the-counter and prescription medicines only as told by your health care provider.  Wash exposed skin as soon as possible with soap and cold  water.  Use hydrocortisone creams or calamine lotion as needed to soothe the skin and relieve itching.  Take oatmeal baths as needed. Use colloidal oatmeal. You can get this at your local pharmacy or grocery store. Follow the instructions on the packaging.  Do not scratch or rub your skin.  While you have the rash, wash clothes right after you wear them. How is this prevented?  Learn to identify the poison ivy plant and avoid contact with the plant. This plant can be recognized by the number of leaves. Generally, poison ivy has three leaves with flowering branches on a single stem. The leaves are typically glossy, and they have jagged edges that come to a point at the front.  If you have been exposed to poison ivy, thoroughly wash with soap and water right away. You have about 30 minutes to remove the plant resin before it will cause the rash. Be sure to wash under your fingernails because any plant resin there will continue to spread the rash.  When hiking or camping, wear clothes that will help you to avoid exposure on the skin. This includes long pants, a long-sleeved shirt, tall socks, and hiking boots. You can also apply preventive lotion to your skin to help limit exposure.  If you suspect that your clothes or outdoor gear came in contact with poison ivy, rinse them off outside with a garden hose before you bring them inside your house. Contact a health care provider if:  You have open sores in the rash area.  You have more redness, swelling, or pain in the affected area.  You have   redness that spreads beyond the rash area.  You have fluid, blood, or pus coming from the affected area.  You have a fever.  You have a rash over a large area of your body.  You have a rash on your eyes, mouth, or genitals.  Your rash does not improve after a few days. Get help right away if:  Your face swells or your eyes swell shut.  You have trouble breathing.  You have trouble  swallowing. This information is not intended to replace advice given to you by your health care provider. Make sure you discuss any questions you have with your health care provider. Document Released: 04/09/2000 Document Revised: 09/18/2015 Document Reviewed: 09/18/2014 Elsevier Interactive Patient Education  2018 [employer]  

## 2016-10-18 NOTE — Progress Notes (Signed)
Subjective:  Patient ID: Carrie [name], female    DOB: April 17, 1946  Age: 71 y.o. MRN: [id number]  CC: Rash   HPI CRIS GIBBY presents for A 3 day history of itchy rash over her left ear and in front of her right elbow. A day before the rash develop she was exposed to aerosolized poison ivy in her yard. She has tried to treat it with Benadryl and topical triamcinolone without much relief from her symptoms.  Outpatient Medications Prior to Visit  Medication Sig Dispense Refill  . CALCIUM PO Take by mouth daily.    . Cholecalciferol (VITAMIN D3) 1000 UNITS CAPS Take 1 capsule by mouth daily.      Marland Kitchen ELIQUIS 5 MG TABS tablet TAKE 1 TABLET (5 MG TOTAL) BY MOUTH 2 (TWO) TIMES DAILY. 60 tablet 10  . fish oil-omega-3 fatty acids 1000 MG capsule Take 1 g by mouth daily.      . rosuvastatin (CRESTOR) 10 MG tablet Take 1 tablet (10 mg total) by mouth daily. 90 tablet 3  . Thiamine HCl (VITAMIN B-1) 250 MG tablet Take 500 mg by mouth daily.      . predniSONE (DELTASONE) 20 MG tablet Take two pills daily for 2 days, 1 tab daily for 2 days, 1/2 tab daily for 2 days 7 tablet 0  . metoprolol succinate (TOPROL-XL) 50 MG 24 hr tablet Take 1 tablet (50 mg total) by mouth daily. Take with or immediately following a meal. 90 tablet 1  . nitroGLYCERIN (NITROLINGUAL) 0.4 MG/SPRAY spray Place 1 spray under the tongue as directed.       No facility-administered medications prior to visit.     ROS Review of Systems  Constitutional: Negative.  Negative for chills, fatigue and fever.  HENT: Negative.  Negative for trouble swallowing and voice change.   Eyes: Negative for visual disturbance.  Respiratory: Negative for cough, chest tightness, shortness of breath and wheezing.   Cardiovascular: Negative for chest pain, palpitations and leg swelling.  Gastrointestinal: Negative for abdominal pain, constipation, diarrhea, nausea and vomiting.  Endocrine: Negative.   Genitourinary: Negative.  Negative for  difficulty urinating.  Musculoskeletal: Negative.  Negative for back pain and myalgias.  Skin: Positive for rash. Negative for color change, pallor and wound.  Allergic/Immunologic: Negative.   Neurological: Negative.  Negative for dizziness.  Hematological: Negative.   Psychiatric/Behavioral: Negative.     Objective:  BP 128/82 (BP Location: Left Arm, Patient Position: Sitting, Cuff Size: Normal)   Pulse 73   Temp 98 F (36.7 C) (Oral)   Resp 16   Ht 5\' 2"  (1.575 m)   Wt 137 lb (62.1 kg)   SpO2 97%   BMI 25.06 kg/m   BP Readings from Last 3 Encounters:  10/18/16 128/82  09/16/16 126/68  09/07/16 134/76    Wt Readings from Last 3 Encounters:  10/18/16 137 lb (62.1 kg)  09/16/16 132 lb (59.9 kg)  09/07/16 135 lb (61.2 kg)    Physical Exam  Constitutional: She is oriented to person, place, and time. No distress.  HENT:  Mouth/Throat: Oropharynx is clear and moist. No oropharyngeal exudate.  Eyes: Conjunctivae are normal. Right eye exhibits no discharge. Left eye exhibits no discharge. No scleral icterus.  Neck: Normal range of motion. Neck supple. No JVD present. No thyromegaly present.  Cardiovascular: Normal rate, regular rhythm and intact distal pulses.  Exam reveals no gallop.   No murmur heard. Pulmonary/Chest: Effort normal and breath sounds normal. No respiratory distress.  She has no wheezes. She has no rales. She exhibits no tenderness.  Abdominal: Soft. Bowel sounds are normal. She exhibits no distension and no mass. There is no tenderness. There is no rebound and no guarding.  Lymphadenopathy:    She has no cervical adenopathy.  Neurological: She is alert and oriented to person, place, and time.  Skin: Skin is warm and dry. Rash noted. No purpura noted. Rash is papular. Rash is not macular, not maculopapular, not nodular, not pustular, not vesicular and not urticarial. She is not diaphoretic. There is erythema. No pallor.  Over the right antecubital fossa there  is a large patch of faint erythema and papules. Over the left outer ear there is confluent area of erythema and papules.  Vitals reviewed.   Lab Results  Component Value Date   WBC 9.1 09/13/2016   HGB 14.9 09/13/2016   HCT 43.8 09/13/2016   PLT 396 (H) 09/13/2016   GLUCOSE 99 09/13/2016   CHOL 281 (H) 09/13/2016   TRIG 238 (H) 09/13/2016   HDL 73 09/13/2016   LDLDIRECT 133.8 12/19/2012   LDLCALC 160 (H) 09/13/2016   ALT 44 (H) 10/18/2016   AST 43 (H) 10/18/2016   NA 136 09/13/2016   K 4.2 09/13/2016   CL 101 09/13/2016   CREATININE 0.65 09/13/2016   BUN 19 09/13/2016   CO2 21 09/13/2016   TSH 0.79 01/20/2016    US Abdomen Complete  Result Date: 08/26/2016 CLINICAL DATA:  Elevated LFTs EXAM: ABDOMEN ULTRASOUND COMPLETE COMPARISON:  None. FINDINGS: Gallbladder: No gallstones or wall thickening visualized. No sonographic Murphy sign noted by sonographer. Common bile duct: Diameter: Normal caliber, 2-3 mm Liver: Increased echotexture compatible with fatty infiltration. No focal abnormality or biliary ductal dilatation. IVC: Visualized portions normal. Pancreas: Visualized portion unremarkable. Spleen: Size and appearance within normal limits. Right Kidney: Length: 10.8 cm. Echogenicity within normal limits. No mass or hydronephrosis visualized. Left Kidney: Length: 10.9 cm. Echogenicity within normal limits. No mass or hydronephrosis visualized. Abdominal aorta: No aneurysm visualized. Other findings: None. IMPRESSION: Fatty liver. No acute findings. Electronically Signed   By: Rolm Baptise M.D.   On: 08/26/2016 11:21    Assessment & Plan:   [name] was seen today for rash.  Diagnoses and all orders for this visit:  Poison ivy dermatitis- will treat with potent, topical steroids and a Depo-Medrol injection. She will continue Benadryl as needed for the itching. -     clobetasol ointment (TEMOVATE) 0.05 %; Apply 1 application topically 2 (two) times daily. -     methylPREDNISolone  acetate (DEPO-MEDROL) injection 120 mg; Inject 1.5 mLs (120 mg total) into the muscle once.   I have discontinued Ms. Macera's predniSONE. I am also having her start on clobetasol ointment. Additionally, I am having her maintain her fish oil-omega-3 fatty acids, nitroGLYCERIN, vitamin B-1, Vitamin D3, CALCIUM PO, ELIQUIS, metoprolol succinate, and rosuvastatin. We administered methylPREDNISolone acetate.  Meds ordered this encounter  Medications  . clobetasol ointment (TEMOVATE) 0.05 %    Sig: Apply 1 application topically 2 (two) times daily.    Dispense:  45 g    Refill:  0  . methylPREDNISolone acetate (DEPO-MEDROL) injection 120 mg     Follow-up: Return in about 3 weeks (around 11/08/2016).  Scarlette Calico, MD

## 2016-10-21 ENCOUNTER — Other Ambulatory Visit: Payer: Self-pay | Admitting: Internal Medicine

## 2016-10-21 ENCOUNTER — Telehealth: Payer: Self-pay | Admitting: Internal Medicine

## 2016-10-21 MED ORDER — PREDNISONE 10 MG PO TABS: ORAL_TABLET | ORAL | 0 refills | Status: DC

## 2016-10-21 NOTE — Telephone Encounter (Signed)
Notes recorded by Leeroy Bock, RPH on 10/20/2016 at 7:45 AM EDT Agree with plan to continue Crestor 10mg  daily - future labs already scheduled for 8/6 to check lipids and LFTs. ------  Notes recorded by End, Harrell Gave, MD on 10/19/2016 at 10:08 PM EDT Slight bump and AST noted. ALT stable with recent addition of statin. I think it is reasonable to continue current dose of rosuvastatin and recheck lipid panel and LFTs in 2 months.    Patient aware and verbalized understanding.  Appt already scheduled for 8/6 for repeat labs.

## 2016-10-21 NOTE — Telephone Encounter (Signed)
New message    Pt is calling about lab results from earlier this week. Please call.

## 2016-10-21 NOTE — Telephone Encounter (Signed)
rx called in and pt informed of same.

## 2016-10-21 NOTE — Telephone Encounter (Signed)
Patient states her poison ivy is no better. It is starting to show up in other places. She had an injection for it on Monday, &she has been using the ointment (Temovate). She would like to know if she needs to come back in or if something else can be called in. Please advise. Thank you.

## 2016-10-21 NOTE — Telephone Encounter (Signed)
Can try an oral steroid taper - pending -- if no improvement she needs to be seen.

## 2016-10-21 NOTE — Telephone Encounter (Signed)
Can you advise in PCP absence.  

## 2016-10-26 ENCOUNTER — Encounter: Payer: Self-pay | Admitting: Family Medicine

## 2016-10-26 ENCOUNTER — Ambulatory Visit (INDEPENDENT_AMBULATORY_CARE_PROVIDER_SITE_OTHER): Payer: Medicare Other | Admitting: Family Medicine

## 2016-10-26 ENCOUNTER — Ambulatory Visit: Payer: Medicare Other | Admitting: Internal Medicine

## 2016-10-26 VITALS — BP 138/72 | HR 95 | Temp 98.2°F | Ht 62.0 in | Wt 137.3 lb

## 2016-10-26 DIAGNOSIS — R350 Frequency of micturition: Secondary | ICD-10-CM | POA: Diagnosis not present

## 2016-10-26 LAB — POCT URINALYSIS DIPSTICK
BILIRUBIN UA: NEGATIVE
GLUCOSE UA: 100
Ketones, UA: NEGATIVE
LEUKOCYTES UA: NEGATIVE
Nitrite, UA: NEGATIVE
PH UA: 5 (ref 5.0–8.0)
Protein, UA: NEGATIVE
RBC UA: NEGATIVE
Spec Grav, UA: 1.015 (ref 1.010–1.025)
Urobilinogen, UA: 0.2 E.U./dL

## 2016-10-26 NOTE — Progress Notes (Deleted)
Subjective:    Patient ID: Carrie [name], female    DOB: 07-26-1945, 71 y.o.   MRN: [id number]  HPI She is here for an acute visit.   ? UTI:    Medications and allergies reviewed with patient and updated if appropriate.  Patient Active Problem List   Diagnosis Date Noted  . Poison ivy dermatitis 09/07/2016  . Elevated LFTs 08/16/2016  . Paroxysmal atrial flutter ([place]) 07/08/2016  . Urinary urgency 11/21/2015  . Varicose veins of lower extremities with other complications 99/83/3825  . Allergic rhinitis 12/28/2012  . OAB (overactive bladder) 06/22/2012  . Routine general medical examination at a health care facility 06/22/2012  . Aortic stenosis 09/05/2010  . Coronary atherosclerosis 06/26/2009  . SUPRAVENTRICULAR TACHYCARDIA 06/23/2009  . Hyperlipidemia 03/06/2009  . Osteoporosis 03/06/2009    Current Outpatient Prescriptions on File Prior to Visit  Medication Sig Dispense Refill  . CALCIUM PO Take by mouth daily.    . Cholecalciferol (VITAMIN D3) 1000 UNITS CAPS Take 1 capsule by mouth daily.      . clobetasol ointment (TEMOVATE) 0.53 % Apply 1 application topically 2 (two) times daily. 45 g 0  . ELIQUIS 5 MG TABS tablet TAKE 1 TABLET (5 MG TOTAL) BY MOUTH 2 (TWO) TIMES DAILY. 60 tablet 10  . fish oil-omega-3 fatty acids 1000 MG capsule Take 1 g by mouth daily.      . metoprolol succinate (TOPROL-XL) 50 MG 24 hr tablet Take 1 tablet (50 mg total) by mouth daily. Take with or immediately following a meal. 90 tablet 1  . nitroGLYCERIN (NITROLINGUAL) 0.4 MG/SPRAY spray Place 1 spray under the tongue as directed.      . predniSONE (DELTASONE) 10 MG tablet Take 4 tabs po qd x 3 days, then 3 tabs po qd x 3 days, then 2 tabs po qd x 3 days, then 1 tab po qd x 3 days 30 tablet 0  . rosuvastatin (CRESTOR) 10 MG tablet Take 1 tablet (10 mg total) by mouth daily. 90 tablet 3  . Thiamine HCl (VITAMIN B-1) 250 MG tablet Take 500 mg by mouth daily.       No current  facility-administered medications on file prior to visit.     Past Medical History:  Diagnosis Date  . Aortic stenosis   . Contact lens/glasses fitting    contacts or gl;asses  . Coronary artery disease   . Osteoporosis, unspecified   . Other and unspecified hyperlipidemia   . Other specified cardiac dysrhythmias(427.89)   . Paroxysmal atrial flutter ([place])   . Undiagnosed cardiac murmurs   . Wears hearing aid    left    Past Surgical History:  Procedure Laterality Date  . ATRIAL ABLATION SURGERY  2005  . BREAST SURGERY  1982   Benign tumor removed RT  . BUNIONECTOMY  04/07/2012   Procedure: Lillard Anes;  Surgeon: Alta Corning, MD;  Location: [facility];  Service: Orthopedics;  Laterality: Left;  CHEVRON OSTEOTOMY LEFT FOOT   . CARDIAC CATHETERIZATION  2005  . GANGLION CYST EXCISION     left wrist  . HERNIA REPAIR  1990   rt ing  . TONSILLECTOMY    . TUBAL LIGATION      Social History   Social History  . Marital status: Divorced    Spouse name: N/A  . Number of children: N/A  . Years of education: N/A   Social History Main Topics  . Smoking status: Former Smoker  Quit date: 04/05/1999  . Smokeless tobacco: Never Used     Comment: Regular Exercise-No  . Alcohol use 4.2 oz/week    7 Glasses of wine per week  . Drug use: No  . Sexual activity: Not Currently   Other Topics Concern  . Not on file   Social History Narrative  . No narrative on file    Family History  Problem Relation Age of Onset  . Heart disease Mother 45       CABG and valve replacement; CHF  . Congestive Heart Failure Mother   . Hypertension Other     Review of Systems     Objective:  There were no vitals filed for this visit. There were no vitals filed for this visit. There is no height or weight on file to calculate BMI.  Wt Readings from Last 3 Encounters:  10/18/16 137 lb (62.1 kg)  09/16/16 132 lb (59.9 kg)  09/07/16 135 lb (61.2 kg)     Physical  Exam        Assessment & Plan:   See Problem List for Assessment and Plan of chronic medical problems.

## 2016-10-26 NOTE — Progress Notes (Signed)
HPI:  Acute visit for dysuria: -started a few days ago, though admits has intermittent symptoms chronically - hx OOB per brief review chart -symptoms: frequency, urgency, mild dysuria - feels pressure -denies: fevers, malaise, NVD, flan or abd pain, hematuria, vaginal symptoms  ROS: See pertinent positives and negatives per HPI.  Past Medical History:  Diagnosis Date  . Aortic stenosis   . Contact lens/glasses fitting    contacts or gl;asses  . Coronary artery disease   . Osteoporosis, unspecified   . Other and unspecified hyperlipidemia   . Other specified cardiac dysrhythmias(427.89)   . Paroxysmal atrial flutter ([place])   . Undiagnosed cardiac murmurs   . Wears hearing aid    left    Past Surgical History:  Procedure Laterality Date  . ATRIAL ABLATION SURGERY  2005  . BREAST SURGERY  1982   Benign tumor removed RT  . BUNIONECTOMY  04/07/2012   Procedure: Lillard Anes;  Surgeon: Alta Corning, MD;  Location: [facility];  Service: Orthopedics;  Laterality: Left;  CHEVRON OSTEOTOMY LEFT FOOT   . CARDIAC CATHETERIZATION  2005  . GANGLION CYST EXCISION     left wrist  . HERNIA REPAIR  1990   rt ing  . TONSILLECTOMY    . TUBAL LIGATION      Family History  Problem Relation Age of Onset  . Heart disease Mother 48       CABG and valve replacement; CHF  . Congestive Heart Failure Mother   . Hypertension Other     Social History   Social History  . Marital status: Divorced    Spouse name: N/A  . Number of children: N/A  . Years of education: N/A   Social History Main Topics  . Smoking status: Former Smoker    Quit date: 04/05/1999  . Smokeless tobacco: Never Used     Comment: Regular Exercise-No  . Alcohol use 4.2 oz/week    7 Glasses of wine per week  . Drug use: No  . Sexual activity: Not Currently   Other Topics Concern  . None   Social History Narrative  . None     Current Outpatient Prescriptions:  .  CALCIUM PO, Take by mouth  daily., Disp: , Rfl:  .  Cholecalciferol (VITAMIN D3) 1000 UNITS CAPS, Take 1 capsule by mouth daily.  , Disp: , Rfl:  .  clobetasol ointment (TEMOVATE) 2.11 %, Apply 1 application topically 2 (two) times daily., Disp: 45 g, Rfl: 0 .  ELIQUIS 5 MG TABS tablet, TAKE 1 TABLET (5 MG TOTAL) BY MOUTH 2 (TWO) TIMES DAILY., Disp: 60 tablet, Rfl: 10 .  fish oil-omega-3 fatty acids 1000 MG capsule, Take 1 g by mouth daily.  , Disp: , Rfl:  .  nitroGLYCERIN (NITROLINGUAL) 0.4 MG/SPRAY spray, Place 1 spray under the tongue as directed.  , Disp: , Rfl:  .  predniSONE (DELTASONE) 10 MG tablet, Take 4 tabs po qd x 3 days, then 3 tabs po qd x 3 days, then 2 tabs po qd x 3 days, then 1 tab po qd x 3 days, Disp: 30 tablet, Rfl: 0 .  rosuvastatin (CRESTOR) 10 MG tablet, Take 1 tablet (10 mg total) by mouth daily., Disp: 90 tablet, Rfl: 3 .  Thiamine HCl (VITAMIN B-1) 250 MG tablet, Take 500 mg by mouth daily.  , Disp: , Rfl:  .  metoprolol succinate (TOPROL-XL) 50 MG 24 hr tablet, Take 1 tablet (50 mg total) by mouth daily. Take  with or immediately following a meal., Disp: 90 tablet, Rfl: 1  EXAM:  Vitals:   10/26/16 1437  BP: 138/72  Pulse: 95  Temp: 98.2 F (36.8 C)    Body mass index is 25.11 kg/m.  GENERAL: vitals reviewed and listed above, alert, oriented, appears well hydrated and in no acute distress  HEENT: atraumatic, conjunttiva clear, no obvious abnormalities on inspection of external nose and ears  NECK: no obvious masses on inspection  LUNGS: clear to auscultation bilaterally, no wheezes, rales or rhonchi, good air movement  CV: SEM  ABD: BS+, soft, NTTP, no CVA TTP  MS: moves all extremities without noticeable abnormality  PSYCH: pleasant and cooperative, no obvious depression or anxiety  ASSESSMENT AND PLAN:  Discussed the following assessment and plan:  Urinary frequency - Plan: POC Urinalysis Dipstick, Culture, Urine  -we discussed possible serious and likely etiologies,  workup and treatment, treatment risks and return precautions -discussed udip with pt, culture pending -advised PCP follow up if worsening or any persistent symptoms -Patient advised to return or notify a doctor immediately if symptoms worsen or new concerns arise.  Declined AVS. There are no Patient Instructions on file for this visit.  Colin Benton R., DO

## 2016-10-28 LAB — URINE CULTURE: Organism ID, Bacteria: NO GROWTH

## 2016-11-10 ENCOUNTER — Encounter (HOSPITAL_COMMUNITY): Payer: Self-pay | Admitting: Emergency Medicine

## 2016-11-10 ENCOUNTER — Emergency Department (HOSPITAL_COMMUNITY): Payer: Medicare Other

## 2016-11-10 DIAGNOSIS — I480 Paroxysmal atrial fibrillation: Secondary | ICD-10-CM | POA: Insufficient documentation

## 2016-11-10 DIAGNOSIS — Z791 Long term (current) use of non-steroidal anti-inflammatories (NSAID): Secondary | ICD-10-CM | POA: Diagnosis not present

## 2016-11-10 DIAGNOSIS — E7800 Pure hypercholesterolemia, unspecified: Secondary | ICD-10-CM | POA: Insufficient documentation

## 2016-11-10 DIAGNOSIS — Z885 Allergy status to narcotic agent status: Secondary | ICD-10-CM | POA: Diagnosis not present

## 2016-11-10 DIAGNOSIS — Z91040 Latex allergy status: Secondary | ICD-10-CM | POA: Insufficient documentation

## 2016-11-10 DIAGNOSIS — I350 Nonrheumatic aortic (valve) stenosis: Secondary | ICD-10-CM | POA: Diagnosis not present

## 2016-11-10 DIAGNOSIS — M810 Age-related osteoporosis without current pathological fracture: Secondary | ICD-10-CM | POA: Insufficient documentation

## 2016-11-10 DIAGNOSIS — Z888 Allergy status to other drugs, medicaments and biological substances status: Secondary | ICD-10-CM | POA: Insufficient documentation

## 2016-11-10 DIAGNOSIS — R079 Chest pain, unspecified: Secondary | ICD-10-CM | POA: Diagnosis not present

## 2016-11-10 DIAGNOSIS — I2510 Atherosclerotic heart disease of native coronary artery without angina pectoris: Secondary | ICD-10-CM | POA: Insufficient documentation

## 2016-11-10 DIAGNOSIS — I4892 Unspecified atrial flutter: Secondary | ICD-10-CM | POA: Insufficient documentation

## 2016-11-10 DIAGNOSIS — Z87891 Personal history of nicotine dependence: Secondary | ICD-10-CM | POA: Insufficient documentation

## 2016-11-10 LAB — CBC
HCT: 41.2 % (ref 36.0–46.0)
Hemoglobin: 13.9 g/dL (ref 12.0–15.0)
MCH: 31.2 pg (ref 26.0–34.0)
MCHC: 33.7 g/dL (ref 30.0–36.0)
MCV: 92.4 fL (ref 78.0–100.0)
PLATELETS: 281 10*3/uL (ref 150–400)
RBC: 4.46 MIL/uL (ref 3.87–5.11)
RDW: 13.1 % (ref 11.5–15.5)
WBC: 10.3 10*3/uL (ref 4.0–10.5)

## 2016-11-10 NOTE — ED Triage Notes (Signed)
Pt presents to ED for assessment of intermittent left sided chest pain since this afternoon.  Pt has a cardiac hx and tried liquid nitro at home without relief.  PT denies any other associated symptoms.

## 2016-11-11 ENCOUNTER — Observation Stay (HOSPITAL_COMMUNITY)
Admission: EM | Admit: 2016-11-11 | Discharge: 2016-11-11 | Disposition: A | Discharge status: Home / Self Care | Payer: Medicare Other | Attending: Nephrology | Admitting: Nephrology

## 2016-11-11 ENCOUNTER — Encounter (HOSPITAL_COMMUNITY): Payer: Self-pay | Admitting: Family Medicine

## 2016-11-11 ENCOUNTER — Observation Stay (HOSPITAL_BASED_OUTPATIENT_CLINIC_OR_DEPARTMENT_OTHER): Payer: Medicare Other

## 2016-11-11 DIAGNOSIS — I360 Nonrheumatic tricuspid (valve) stenosis: Secondary | ICD-10-CM

## 2016-11-11 DIAGNOSIS — I2583 Coronary atherosclerosis due to lipid rich plaque: Secondary | ICD-10-CM

## 2016-11-11 DIAGNOSIS — E785 Hyperlipidemia, unspecified: Secondary | ICD-10-CM | POA: Diagnosis present

## 2016-11-11 DIAGNOSIS — I2510 Atherosclerotic heart disease of native coronary artery without angina pectoris: Secondary | ICD-10-CM | POA: Diagnosis not present

## 2016-11-11 DIAGNOSIS — I4892 Unspecified atrial flutter: Secondary | ICD-10-CM | POA: Diagnosis present

## 2016-11-11 DIAGNOSIS — R079 Chest pain, unspecified: Secondary | ICD-10-CM | POA: Diagnosis not present

## 2016-11-11 DIAGNOSIS — I350 Nonrheumatic aortic (valve) stenosis: Secondary | ICD-10-CM

## 2016-11-11 HISTORY — DX: Personal history of nicotine dependence: Z87.891

## 2016-11-11 HISTORY — DX: Other ill-defined heart diseases: I51.89

## 2016-11-11 HISTORY — DX: Nonrheumatic aortic (valve) insufficiency: I35.1

## 2016-11-11 LAB — ECHOCARDIOGRAM COMPLETE
AOPV: 0.48 m/s
AOVTI: 76 cm
AV Peak grad: 46 mmHg
AV VEL mean LVOT/AV: 0.51
AV area mean vel ind: 0.62 cm2/m2
AV peak Index: 0.59
AV vel: 1.08
AVAREAMEANV: 1.02 cm2
AVAREAVTI: 0.96 cm2
AVAREAVTIIND: 0.66 cm2/m2
AVG: 24 mmHg
AVLVOTPG: 10 mmHg
AVPHT: 491 ms
AVPKVEL: 338 cm/s
Area-P 1/2: 2.37 cm2
CHL CUP DOP CALC LVOT VTI: 40.7 cm
DOP CAL AO MEAN VELOCITY: 225 cm/s
E decel time: 317 msec
E/e' ratio: 28.55
FS: 28 % (ref 28–44)
Height: 62 in
IVS/LV PW RATIO, ED: 1.27
LA ID, A-P, ES: 37 mm
LA diam index: 2.26 cm/m2
LA vol index: 37.1 mL/m2
LA vol: 60.8 mL
LAVOLA4C: 57.8 mL
LEFT ATRIUM END SYS DIAM: 37 mm
LV PW d: 9.76 mm — AB (ref 0.6–1.1)
LV TDI E'MEDIAL: 5.03
LVEEAVG: 28.55
LVEEMED: 28.55
LVELAT: 3.45 cm/s
LVOT SV: 82 mL
LVOT area: 2.01 cm2
LVOT peak vel: 162 cm/s
LVOTD: 16 mm
LVOTVTI: 0.54 cm
MV Dec: 317
MV Peak grad: 4 mmHg
MVPKAVEL: 131 m/s
MVPKEVEL: 98.5 m/s
MVSPHT: 93 ms
PISA EROA: 0.03 cm2
RV LATERAL S' VELOCITY: 13.8 cm/s
RV TAPSE: 16.1 mm
RV sys press: 32 mmHg
Reg peak vel: 271 cm/s
TDI e' lateral: 3.45
TRMAXVEL: 271 cm/s
VTI: 167 cm
Valve area index: 0.66
Valve area: 1.08 cm2
Weight: 2139.2 oz

## 2016-11-11 LAB — BASIC METABOLIC PANEL
ANION GAP: 8 (ref 5–15)
BUN: 18 mg/dL (ref 6–20)
CALCIUM: 9.3 mg/dL (ref 8.9–10.3)
CHLORIDE: 104 mmol/L (ref 101–111)
CO2: 24 mmol/L (ref 22–32)
Creatinine, Ser: 0.65 mg/dL (ref 0.44–1.00)
GFR calc Af Amer: >60 mL/min (ref >60)
GFR calc non Af Amer: >60 mL/min (ref >60)
GLUCOSE: 121 mg/dL — AB (ref 65–99)
Potassium: 3.7 mmol/L (ref 3.5–5.1)
Sodium: 136 mmol/L (ref 135–145)

## 2016-11-11 LAB — TROPONIN I

## 2016-11-11 LAB — I-STAT TROPONIN, ED: Troponin i, poc: 0 ng/mL (ref 0.00–0.08)

## 2016-11-11 MED ORDER — ASPIRIN 81 MG PO CHEW
324.0000 mg | CHEWABLE_TABLET | Freq: Once | ORAL | Status: AC
  Administered 2016-11-11: 324 mg via ORAL
  Filled 2016-11-11: qty 4

## 2016-11-11 MED ORDER — ONDANSETRON HCL 4 MG/2ML IJ SOLN: 4.0000 mg | Freq: Four times a day (QID) | INTRAMUSCULAR | Status: DC | PRN

## 2016-11-11 MED ORDER — GI COCKTAIL ~~LOC~~: 30.0000 mL | Freq: Four times a day (QID) | ORAL | Status: DC | PRN

## 2016-11-11 MED ORDER — APIXABAN 5 MG PO TABS
5.0000 mg | ORAL_TABLET | Freq: Two times a day (BID) | ORAL | Status: DC
  Administered 2016-11-11: 5 mg via ORAL
  Filled 2016-11-11: qty 1

## 2016-11-11 MED ORDER — ASPIRIN EC 81 MG PO TBEC
81.0000 mg | DELAYED_RELEASE_TABLET | Freq: Every day | ORAL | Status: DC
  Filled 2016-11-11: qty 1

## 2016-11-11 MED ORDER — ACETAMINOPHEN 325 MG PO TABS: 650.0000 mg | ORAL_TABLET | ORAL | Status: DC | PRN

## 2016-11-11 MED ORDER — METOPROLOL SUCCINATE ER 50 MG PO TB24
50.0000 mg | ORAL_TABLET | Freq: Every day | ORAL | Status: DC
  Administered 2016-11-11: 50 mg via ORAL
  Filled 2016-11-11: qty 1

## 2016-11-11 MED ORDER — ROSUVASTATIN CALCIUM 10 MG PO TABS
10.0000 mg | ORAL_TABLET | Freq: Every day | ORAL | Status: DC
  Administered 2016-11-11: 10 mg via ORAL
  Filled 2016-11-11: qty 1

## 2016-11-11 NOTE — Progress Notes (Signed)
Per Carolin Sicks hold afternoon dose of aspirin, team will follow up with patient tomorrow Neta Mends RN 4:53 PM 11-11-2016

## 2016-11-11 NOTE — Progress Notes (Signed)
2d echo reviewed with Dr. Debara Pickett who feels not significantly changed. Patient feels well and wishes to go home. Per nurse has been ambulating without any recurrent sx. Will arrange OP f/u to discuss whether further workup is necessary, I.e. OP stress. Patient made aware of plan as well as IM. Dayna Dunn PA-C

## 2016-11-11 NOTE — Consult Note (Signed)
Cardiology Consultation:   Patient ID: Carrie [name]; [id number]; 23-Aug-1945   Admit date: 11/11/2016 Date of Consult: 11/11/2016  Primary Care Provider: [name], MD Primary Cardiologist: Dr. Saunders Revel  Chief Complaint: chest pain  Patient Profile:   Carrie [name] is a 71 y.o. female with a hx of paroxysmal atrial flutter, moderate aortic stenosis, narrow LVOT by echo 01/2016, moderate nonobstructive CAD by cath 2005, former tobacco, hyperlipidemia, osteoporosis, prior elevated LFTs on statin therapy who is being seen today for the evaluation of chest pain at the request of Dr. Loleta Books.  History of Present Illness:   Prior LHC (07/24/03): LMCA heavily calcified without significant stenosis, LAD with heavy calcification proximally. 50-60% stenosis just distal to D2, LCx calcified proximally without significant stenosis, Dominant RCA with moderate proximal calcification and mild luminal irregularities throughout, LVEDP 12, LVEF 70%. Last echo 01/2016 showed mild LVH, grade 1 DD, moderate AS, mild AI, SAM of MV with narrow LVOT; LVOT gradient not well interrogated; severe MAC with mild MR; mild TR (repeat planned in 12/2016).  She presented to [facility] last night with episodic chest pain that began yesterday after lunch. She began having brief episodes of left sided chest discomfort described as a "twitching/burning" that would last 1 minute then ease off spontaneously. This was regardless of activity or rest, not worse with exertion, palpation, movement or inspiration. She last exercised by walking on Sunday 7/15 without any difficulty. She tried both a SL NTG spray and Pepcid but pain continued to come and go intermittently. She denies any SOB, n/v, diaphoresis, palpitations or syncope. Due to persistence of sx and talking to family members, she presented to the ER. She noticed 1-2 more episodes since being here but otherwise feels better. Rec'd 364m ASA. Troponins neg x 3, glucose 121, otherwise  labs benign. CXR NAD, telemetry NSR (personally reviewed).  Past Medical History:  Diagnosis Date  . Aortic stenosis   . Coronary artery disease    a. Prior LHC (07/24/03): LMCA heavily calcified without significant stenosis, LAD with heavy calcification proximally. 50-60% stenosis just distal to D2, LCx calcified proximally without significant stenosis, Dominant RCA with moderate proximal calcification and mild luminal irregularities throughout, LVEDP 12, LVEF 70%  . Diastolic dysfunction without heart failure   . Former tobacco use   . Former tobacco use   . Mild AI (aortic insufficiency)   . Osteoporosis, unspecified   . Other and unspecified hyperlipidemia   . Paroxysmal atrial flutter ([place]   . Wears hearing aid    left    Past Surgical History:  Procedure Laterality Date  . ATRIAL ABLATION SURGERY  2005  . BREAST SURGERY  1982   Benign tumor removed RT  . BUNIONECTOMY  04/07/2012   Procedure: BLillard Anes  Surgeon: JAlta Corning MD;  [place]  Service: Orthopedics;  Laterality: Left;  CHEVRON OSTEOTOMY LEFT FOOT   . CARDIAC CATHETERIZATION  2005  . GANGLION CYST EXCISION     left wrist  . HERNIA REPAIR  1990   rt ing  . TONSILLECTOMY    . TUBAL LIGATION       Inpatient Medications: Scheduled Meds: . apixaban  5 mg Oral BID  . rosuvastatin  10 mg Oral Daily   Continuous Infusions:  PRN Meds: acetaminophen, gi cocktail, ondansetron (ZOFRAN) IV  Allergies:    Allergies  Allergen Reactions  . Codeine Nausea And Vomiting  . Crestor [Rosuvastatin] Other (See Comments)    Crestor 441mfatigue  and exhaustion--pt tolerates crestor 4m  . Latex Rash    Social History:   Social History   Social History  . Marital status: Divorced    Spouse name: N/A  . Number of children: N/A  . Years of education: N/A   Occupational History  . Not on file.   Social History Main Topics  . Smoking status: Former Smoker    Quit date: 04/05/1999   . Smokeless tobacco: Never Used     Comment: Regular Exercise-No  . Alcohol use 4.2 oz/week    7 Glasses of wine per week     Comment: 1 drink per week  . Drug use: No  . Sexual activity: Not Currently   Other Topics Concern  . Not on file   Social History Narrative  . No narrative on file    Family History:   The patient's family history includes Congestive Heart Failure in her mother; Dementia in her mother; Heart disease (age of onset: 614 in her mother; Hypertension in her other; Valvular heart disease in her father.  ROS:  Please see the history of present illness.  No bleeding. All other ROS reviewed and negative.     Physical Exam/Data:   Vitals:   11/11/16 0230 11/11/16 0300 11/11/16 0330 11/11/16 0407  BP: (!) 129/91 (!) 143/54 (!) 141/58 (!) 121/58  Pulse: 64 65 67 63  Resp: (!) 22 (!) 22 (!) 23 (!) 22  Temp:    97.6 F (36.4 C)  TempSrc:    Oral  SpO2: 94% 97% 96% 98%  Weight:    133 lb 11.2 oz (60.6 kg)  Height:    5' 2"  (1.575 m)   No intake or output data in the 24 hours ending 11/11/16 0908 Filed Weights   11/11/16 0407  Weight: 133 lb 11.2 oz (60.6 kg)   Body mass index is 24.45 kg/m.  General: Well developed, well nourished WF, in no acute distress. Head: Normocephalic, atraumatic, sclera non-icteric, no xanthomas, nares are without discharge.  Neck: Bilateral soft carotid bruits. JVD not elevated. Lungs: Clear bilaterally to auscultation without wheezes, rales, or rhonchi. Breathing is unlabored. Heart: RRR, diminished S2 At least moderate 3/6 SEM at RUSB also heard throughout precordium. No rubs or gallops appreciated. Abdomen: Soft, non-tender, non-distended with normoactive bowel sounds. No hepatomegaly. No rebound/guarding. No obvious abdominal masses. Msk:  Strength and tone appear normal for age. Extremities: No clubbing or cyanosis. No edema.  Distal pedal pulses are 2+ and equal bilaterally. Neuro: Alert and oriented X 3. No facial  asymmetry. No focal deficit. Moves all extremities spontaneously. Psych:  Responds to questions appropriately with a normal affect.  EKG:  The EKG was personally reviewed and demonstrates NSR 91bpm, possible LAE, LVHw ith repol abnormality with nonspecific TW changes I, avL. Not markedly changed from prior  Relevant CV Studies: Summarized above  Laboratory Data:  Chemistry  Recent Labs Lab 11/10/16 2331  NA 136  K 3.7  CL 104  CO2 24  GLUCOSE 121*  BUN 18  CREATININE 0.65  CALCIUM 9.3  GFRNONAA >60  GFRAA >60  ANIONGAP 8    No results for input(s): PROT, ALBUMIN, AST, ALT, ALKPHOS, BILITOT in the last 168 hours. Hematology  Recent Labs Lab 11/10/16 2331  WBC 10.3  RBC 4.46  HGB 13.9  HCT 41.2  MCV 92.4  MCH 31.2  MCHC 33.7  RDW 13.1  PLT 281   Cardiac Enzymes  Recent Labs Lab 11/11/16 0442 11/11/16 01829  TROPONINI <0.03 <0.03     Recent Labs Lab 11/10/16 2355  TROPIPOC 0.00    BNPNo results for input(s): BNP, PROBNP in the last 168 hours.  DDimer No results for input(s): DDIMER in the last 168 hours.  Radiology/Studies:  Dg Chest 2 View  Result Date: 11/11/2016 CLINICAL DATA:  Chest pain earlier this evening, now subsided. EXAM: CHEST  2 VIEW COMPARISON:  05/27/2015 CXR FINDINGS: Heart is normal in size. Mild uncoiling of the thoracic aorta with atherosclerosis at the arch and descending portions. Lungs are free of pneumonic consolidations and CHF. No effusion or pneumothorax. Degenerative changes are seen along the dorsal spine as before. Chronic stable mid and lower thoracic vertebral body compressions. IMPRESSION: No active cardiopulmonary disease.  Aortic atherosclerosis. Electronically Signed   By: Ashley Royalty M.D.   On: 11/11/2016 00:35    Assessment and Plan:   1. Chest pain - generally atypical features, but new onset for patient. EKG generally unchanged and troponins negative despite on/off sx from around 1pm until 11pm. I feel 2D echo  would be most helpful test to start with to decide where to go from here. If AS has progressed or increased LVOT obstruction, may need to consider LHC to further evaluate. If valvular disease is stable, may need to consider nuclear. I will review with MD.  2. Moderate AS, prior narrow LVOT - 2D echo pending,  3. Known CAD - see above. Resume home BB. Continue statin. Would continue daily low dose aspirin while this is being evaluated.  4. Hyperlipidemia - followed by lipid clinic given prior h/o elevated LFTs on statin.  5. H/o paroxysmal atrial flutter identified on remote event monitor - no clinical recurrence recently, follow on tele. Continue Eliquis. CHADSVASC 3.   6. Bilateral carotid bruits - suspect radiation of AS murmur but can consider OP carotid duplex to characterize. Last duplex 2007 for right carotid bruit showed nonobstructing plaque.   Signed, Charlie Pitter, PA-C  11/11/2016 9:08 AM

## 2016-11-11 NOTE — H&P (Signed)
History and Physical  Patient Name: Carrie [name]     WGN:[id number]    DOB: June 06, 1945    DOA: 11/11/2016 PCP: [name], MD   Patient coming from: Home     Chief Complaint: Chest pain  HPI: Carrie [name] is a 71 y.o. female with a past medical history significant for pAF on Eliquis, nonobs CAD, and hyperlipidemia who presents with chest pain.  The patient was in her usual state of health until the last few weeks she has noticed some intermittent substernal "fullness" with exertion, relieved with rest. Then today, she was walking around and noticed a more intense chest discomfort, although she describes this as "tingling", again central chest and nonradiating, and relieved with rest. It was not associated with diaphoresis, nausea, sweats, or dyspnea.  ED course: -Afebrile, heart rate 96, respiration pulse ox normal, blood pressure 135/80 -Initial ECG showed sinus tachycardia and troponin was negative. -Na 136, K 3.7, Cr 0.65, WBC 10.3, Hgb 13.9 -Chest x-ray clear -TRH was asked to admit for observation, serial troponins and risk stratification.   She has hyperlipidemia, and was recently restarted on Crestor 10 mg. She is a former smoker, quit 10 years ago. Her father had severe valvular disease, and coronary disease requiring CABG. Her mother had valvular disease, but surgery was deferred because of her dementia.    Review of Systems:  Review of Systems  Respiratory: Negative for cough and shortness of breath.   Cardiovascular: Positive for chest pain. Negative for palpitations and leg swelling.  All other systems reviewed and are negative.    Past Medical History:  Diagnosis Date  . Aortic stenosis   . Contact lens/glasses fitting    contacts or gl;asses  . Coronary artery disease   . Osteoporosis, unspecified   . Other and unspecified hyperlipidemia   . Other specified cardiac dysrhythmias(427.89)   . Paroxysmal atrial [place]utter ([place]   . Undiagnosed cardiac  murmurs   . Wears hearing aid    left    Past Surgical History:  Procedure Laterality Date  . ATRIAL ABLATION SURGERY  2005  . BREAST SURGERY  1982   Benign tumor removed RT  . BUNIONECTOMY  04/07/2012   Procedure: Lillard Anes;  Surgeon: Alta Corning, MD;  Location: [place];  Service: Orthopedics;  Laterality: Left;  CHEVRON OSTEOTOMY LEFT FOOT   . CARDIAC CATHETERIZATION  2005  . GANGLION CYST EXCISION     left wrist  . HERNIA REPAIR  1990   rt ing  . TONSILLECTOMY    . TUBAL LIGATION      Social History: Patient lives Alone.  Patient walks unassisted, she still drives. She works for [employer]. She was a former smoker.  Allergies  Allergen Reactions  . Codeine Nausea And Vomiting  . Crestor [Rosuvastatin] Other (See Comments)    Crestor 40mg  fatigue and exhaustion--pt tolerates crestor 20mg   . Latex Rash    Family history: family history includes Congestive Heart Failure in her mother; Dementia in her mother; Heart disease (age of onset: 40) in her mother; Hypertension in her other; Valvular heart disease in her father.  Prior to Admission medications   Medication Sig Start Date End Date Taking? Authorizing Provider  CALCIUM PO Take by mouth daily.   Yes [provider]  Cholecalciferol (VITAMIN D3) 1000 UNITS CAPS Take 1 capsule by mouth daily.     Yes [provider]  ELIQUIS 5 MG TABS tablet TAKE 1  TABLET (5 MG TOTAL) BY MOUTH 2 (TWO) TIMES DAILY. 04/02/16  Yes Larey Dresser, MD  fish oil-omega-3 fatty acids 1000 MG capsule Take 1 g by mouth daily.     Yes [provider]  metoprolol succinate (TOPROL-XL) 50 MG 24 hr tablet Take 1 tablet (50 mg total) by mouth daily. Take with or immediately following a meal. 07/08/16 11/11/16 Yes End, Harrell Gave, MD  nitroGLYCERIN (NITROLINGUAL) 0.4 MG/SPRAY spray Place 1 spray under the tongue every 5 (five) minutes x 3 doses as needed for chest pain.    Yes [provider]  rosuvastatin (CRESTOR) 10 MG tablet Take 1 tablet (10 mg total) by mouth daily. 09/16/16 12/15/16 Yes End, Harrell Gave, MD  Thiamine HCl (VITAMIN B-1) 250 MG tablet Take 500 mg by mouth daily.     Yes [provider]       Physical Exam: BP (!) 129/91   Pulse 64   Temp (!) 97.4 F (36.3 C) (Oral)   Resp (!) 22   SpO2 94%  General appearance: Thin elderly, adult female, alert and in no acute distress.   Eyes: Anicteric, conjunctiva pink, lids and lashes normal.     ENT: No nasal deformity, discharge, or epistaxis.  OP moist without lesions.   Skin: Warm and dry.   Cardiac: RRR, nl V7-O1, harsh systolic murmur, long and radiating to carotids.  Capillary refill is brisk.  JVP not visible.  No LE edema.  Radial and DP pulses 2+ and symmetric.  No carotid bruits. Respiratory: Normal respiratory rate and rhythm.  CTAB without rales or wheezes. GI: Abdomen soft without rigidity.  No TTP. No ascites, distension.   MSK: No deformities or effusions.   Pain not reproduced with palpation of precordium.  No pain with arm movement. Neuro: Sensorium intact and responding to questions, attention normal.  Speech is fluent.  Moves all extremities equally and with normal coordination.    Psych: Behavior appropriate.  Affect pleasant.  No evidence of aural or visual hallucinations or delusions.       Labs on Admission:  The metabolic panel shows normal electrolytes and renal function. The complete blood count shows no anemia, thrombocytopenia, leukocytosis. The initial troponin is negative.  Radiological Exams on Admission: Personally reviewed chest x-ray shows no focal opacities or airspace disease: Dg Chest 2 View  Result Date: 11/11/2016 CLINICAL DATA:  Chest pain earlier this evening, now subsided. EXAM: CHEST  2 VIEW COMPARISON:  05/27/2015 CXR FINDINGS: Heart is normal in size. Mild uncoiling of the thoracic aorta with atherosclerosis at the arch and descending portions.  Lungs are free of pneumonic consolidations and CHF. No effusion or pneumothorax. Degenerative changes are seen along the dorsal spine as before. Chronic stable mid and lower thoracic vertebral body compressions. IMPRESSION: No active cardiopulmonary disease.  Aortic atherosclerosis. Electronically Signed   By: Ashley Royalty M.D.   On: 11/11/2016 00:35    EKG: Independently reviewed. Rate 91, QTC 447, no ST changes.  Echocardiogram October 2017: Report reviewed EF normal Grade 1 diastolic dysfunction Mild AR, moderate left ear, gradient 29 Mild MR Mild LVH  LHC 2005: Reportedly in the last cardiology pharmacist note, had proximal LAD calcifications, distal LAD 50% lesion          Assessment/Plan  1. Chest pain: This is new.  The patient has HEART score of 5. Angina is atypical in character, but still exertional and somewhat dynamic.  Other potential causes of chest pain (PE, dissection, pancreatitis, pneumonia/effusion, pericarditis)  are doubted.  We have been asked to admit the patient for observation and etiology consultation with Cardiology tomorrow.  -Serial troponins are ordered -Telemetry -Echocardiogram ordered given hx of AS -Consult to cardiology, appreciate recommendations   2. pAF:  CHADS2-VASc 4. -Continue Elquis -Hold metoprolol given possible stress  3. Hyperlipidemia:  -Continue Crestor  4. Aortic stenosis:  -Obtain echo -Consult to Cardiology       DVT prophylaxis: N/A Diet: NPO after 4am for anticipated stress testing Code Status: FULL  Family Communication: Son at bedside  Disposition Plan: Anticipate overnight observation for arrhythmia on telemetry, serial troponins and subsequent risk stratification by Cardiology.  If testing negative, home after. Consults called: Cardiology via Inbasket Admission status: Telemetry   Medical decision making: Patient seen at 2:45 AM on 11/11/2016.  The patient was discussed with Dr. Leonides Schanz. What exists of the  patient's chart was reviewed in depth.  Clinical condition: stable.      Edwin Dada [facility] Pager [phone]

## 2016-11-11 NOTE — Progress Notes (Addendum)
The patient was seen and examined at bedside. She was admitted earlier today for the evaluation of chest pain. Troponin negative, EKG negative for ischemic change. Patient has no chest pain or shortness of breath this morning. She has a history of aortic stenosis. Cardiology consult appreciated. Waiting for echocardiogram for the evaluation of aortic valve. Cardiology is evaluating if patient will need cardiac cath. Resume aspirin, metoprolol. Continue eliquis.   Chart reviewed. Blood pressure 121/58, heart rate 66 Lungs clear bilaterally, no wheezing or crackle Cardiovascular: Regular rate rhythm, S1-S2 normal. No lower extremity edema.

## 2016-11-11 NOTE — Discharge Summary (Signed)
Physician Discharge Summary  Carrie [name] XLK:[id number] DOB: 1945/09/21 DOA: 11/11/2016  PCP: [name], MD  Admit date: 11/11/2016 Discharge date: 11/11/2016  Admitted From:home Disposition:home  Recommendations for Outpatient Follow-up:  1. Follow up with PCP in 1-2 weeks 2. Please obtain BMP/CBC in one week   Home Health:no Equipment/Devices:no Discharge Condition:stable CODE STATUS:full code Diet recommendation:heart healthy  Brief/Interim Summary: 71 y/o female with h/o paroxysmal Aflutter, moderate AS, moderate nonobstructive CAD by cath 2005, HLD, admitted for chest pain evaluation. Troponin and EKG unremarkable. 2D echo unchanged from prior. Chest pain resolved, its atypical chest pain. Pt is able to ambulate without any symptoms. Spoke with Cardiology team recommended to discharge patient with current medications and outpatient follow up. Pt is already on eliquis. Medically stable to discharge with outpatient follow up.  Discharge Diagnoses:  Principal Problem:   Chest pain Active Problems:   Hyperlipidemia   Coronary artery disease due to lipid rich plaque   Aortic stenosis   Paroxysmal atrial flutter [facility])    Discharge Instructions  Discharge Instructions    Call MD for:  difficulty breathing, headache or visual disturbances    Complete by:  As directed    Call MD for:  extreme fatigue    Complete by:  As directed    Call MD for:  hives    Complete by:  As directed    Call MD for:  persistant dizziness or light-headedness    Complete by:  As directed    Call MD for:  persistant nausea and vomiting    Complete by:  As directed    Call MD for:  severe uncontrolled pain    Complete by:  As directed    Call MD for:  temperature >100.4    Complete by:  As directed    Diet - low sodium heart healthy    Complete by:  As directed    Increase activity slowly    Complete by:  As directed      Allergies as of 11/11/2016      Reactions   Codeine Nausea  And Vomiting   Crestor [rosuvastatin] Other (See Comments)   Crestor 40mg  fatigue and exhaustion--pt tolerates crestor 20mg    Latex Rash      Medication List    TAKE these medications   CALCIUM PO Take by mouth daily.   ELIQUIS 5 MG Tabs tablet Generic drug:  apixaban TAKE 1 TABLET (5 MG TOTAL) BY MOUTH 2 (TWO) TIMES DAILY.   fish oil-omega-3 fatty acids 1000 MG capsule Take 1 g by mouth daily.   metoprolol succinate 50 MG 24 hr tablet Commonly known as:  TOPROL-XL Take 1 tablet (50 mg total) by mouth daily. Take with or immediately following a meal.   nitroGLYCERIN 0.4 MG/SPRAY spray Commonly known as:  NITROLINGUAL Place 1 spray under the tongue every 5 (five) minutes x 3 doses as needed for chest pain.   rosuvastatin 10 MG tablet Commonly known as:  CRESTOR Take 1 tablet (10 mg total) by mouth daily.   vitamin B-1 250 MG tablet Take 500 mg by mouth daily.   Vitamin D3 1000 units Caps Take 1 capsule by mouth daily.      Follow-up Information    End, Harrell Gave, MD Follow up.   Specialty:  Cardiology Why:  Office will call you for your follow-up appointment. Call if you have not heard back within 3 days. Contact information: [address] [place] [address] [phone]  [name], MD. Schedule an appointment as soon as possible for a visit in 1 week(s).   Specialty:  Internal Medicine Contact information: [address] [place] [address] [phone]          Allergies  Allergen Reactions  . Codeine Nausea And Vomiting  . Crestor [Rosuvastatin] Other (See Comments)    Crestor 40mg  fatigue and exhaustion--pt tolerates crestor 20mg   . Latex Rash    Consultations: cardiology  Procedures/Studies: echo  Subjective: Seen and examined at bedside. Denied chest pain, SOB, nausea, vomiting, abdomen pain, dizziness, headache.  Discharge Exam: Vitals:   11/11/16 0407 11/11/16 1341  BP: (!) 121/58  128/62  Pulse: 63 64  Resp: (!) 22 18  Temp: 97.6 F (36.4 C) 98 F (36.7 C)   Vitals:   11/11/16 0300 11/11/16 0330 11/11/16 0407 11/11/16 1341  BP: (!) 143/54 (!) 141/58 (!) 121/58 128/62  Pulse: 65 67 63 64  Resp: (!) 22 (!) 23 (!) 22 18  Temp:   97.6 F (36.4 C) 98 F (36.7 C)  TempSrc:   Oral Oral  SpO2: 97% 96% 98% 98%  Weight:   60.6 kg (133 lb 11.2 oz)   Height:   5\' 2"  (1.575 m)     General: Pt is alert, awake, not in acute distress Cardiovascular: RRR, S1/S2 +, no rubs, no gallops Respiratory: CTA bilaterally, no wheezing, no rhonchi Abdominal: Soft, NT, ND, bowel sounds + Extremities: no edema, no cyanosis    The results of significant diagnostics from this hospitalization (including imaging, microbiology, ancillary and laboratory) are listed below for reference.     Microbiology: No results found for this or any previous visit (from the past 240 hour(s)).   Labs: BNP (last 3 results) No results for input(s): BNP in the last 8760 hours. Basic Metabolic Panel:  Recent Labs Lab 11/10/16 2331  NA 136  K 3.7  CL 104  CO2 24  GLUCOSE 121*  BUN 18  CREATININE 0.65  CALCIUM 9.3   Liver Function Tests: No results for input(s): AST, ALT, ALKPHOS, BILITOT, PROT, ALBUMIN in the last 168 hours. No results for input(s): LIPASE, AMYLASE in the last 168 hours. No results for input(s): AMMONIA in the last 168 hours. CBC:  Recent Labs Lab 11/10/16 2331  WBC 10.3  HGB 13.9  HCT 41.2  MCV 92.4  PLT 281   Cardiac Enzymes:  Recent Labs Lab 11/11/16 0442 11/11/16 0712 11/11/16 1052  TROPONINI <0.03 <0.03 <0.03   BNP: Invalid input(s): POCBNP CBG: No results for input(s): GLUCAP in the last 168 hours. D-Dimer No results for input(s): DDIMER in the last 72 hours. Hgb A1c No results for input(s): HGBA1C in the last 72 hours. Lipid Profile No results for input(s): CHOL, HDL, LDLCALC, TRIG, CHOLHDL, LDLDIRECT in the last 72 hours. Thyroid function  studies No results for input(s): TSH, T4TOTAL, T3FREE, THYROIDAB in the last 72 hours.  Invalid input(s): FREET3 Anemia work up No results for input(s): VITAMINB12, FOLATE, FERRITIN, TIBC, IRON, RETICCTPCT in the last 72 hours. Urinalysis    Component Value Date/Time   COLORURINE YELLOW 07/22/2014 0908   APPEARANCEUR CLEAR 07/22/2014 0908   LABSPEC 1.020 07/22/2014 0908   PHURINE 5.5 07/22/2014 0908   GLUCOSEU NEGATIVE 07/22/2014 0908   HGBUR TRACE-LYSED (A) 07/22/2014 0908   BILIRUBINUR negative 10/26/2016 1451   KETONESUR NEGATIVE 07/22/2014 0908   PROTEINUR negative 10/26/2016 1451   UROBILINOGEN 0.2 10/26/2016 1451   UROBILINOGEN 0.2 07/22/2014 0908  NITRITE negative 10/26/2016 1451   NITRITE NEGATIVE 07/22/2014 0908   LEUKOCYTESUR Negative 10/26/2016 1451   Sepsis Labs Invalid input(s): PROCALCITONIN,  WBC,  LACTICIDVEN Microbiology No results found for this or any previous visit (from the past 240 hour(s)).   Time coordinating discharge: 28 minutes  SIGNED:   Rosita Fire, MD  [facility] 11/11/2016, 5:36 PM  If 7PM-7AM, please contact night-coverage [email or web address] Password [email or web address]

## 2016-11-11 NOTE — Progress Notes (Signed)
NURSING PROGRESS NOTE  Carrie [name] [id number] Admission Data: 11/11/2016 6:50 AM Attending Provider: Rosita Fire, MD EBR:AXENM, Arvid Right, MD Code Status: Full Code   Carrie [name] is a 71 y.o. female patient admitted from ED:  -No acute distress noted.  -No complaints of shortness of breath.  -No complaints of chest pain.   Cardiac Monitoring: Box # 05 in place. Cardiac monitor yields:normal sinus rhythm.  Blood pressure (!) 121/58, pulse 63, temperature 97.6 F (36.4 C), temperature source Oral, resp. rate (!) 22, height 5\' 2"  (1.575 m), weight 60.6 kg (133 lb 11.2 oz), SpO2 98 %.   IV Fluids:  IV in place, occlusive dsg intact without redness, IV cath wrist right, condition patent and no redness none.   Allergies:  Codeine; Crestor [rosuvastatin]; and Latex  Past Medical History:   has a past medical history of Aortic stenosis; Contact lens/glasses fitting; Coronary artery disease; Osteoporosis, unspecified; Other and unspecified hyperlipidemia; Other specified cardiac dysrhythmias(427.89); Paroxysmal atrial flutter ([place]); Undiagnosed cardiac murmurs; and Wears hearing aid.  Past Surgical History:   has a past surgical history that includes Breast surgery (1982); Hernia repair (1990); Ganglion cyst excision; Tonsillectomy; Tubal ligation; Cardiac catheterization (2005); Atrial ablation surgery (2005); and Bunionectomy (04/07/2012).  Social History:   reports that she quit smoking about 17 years ago. She has never used smokeless tobacco. She reports that she drinks about 4.2 oz of alcohol per week . She reports that she does not use drugs.  Skin: Intact  Patient/Family orientated to room. Information packet given to patient/family. Admission inpatient armband information verified with patient/family to include name and date of birth and placed on patient arm. Side rails up x 2, fall assessment and education completed with patient/family. Patient/family able to  verbalize understanding of risk associated with falls and verbalized understanding to call for assistance before getting out of bed. Call light within reach. Patient/family able to voice and demonstrate understanding of unit orientation instructions.    Will continue to evaluate and treat per MD orders.

## 2016-11-11 NOTE — ED Provider Notes (Signed)
By signing my name below, I, Ephriam Jenkins, attest that this documentation has been prepared under the direction and in the presence of Masiah Woody, Delice Bison, DO. Electronically signed, Ephriam Jenkins, ED Scribe. 11/11/16. 2:09 AM.   TIME SEEN: 2:00 AM  CHIEF COMPLAINT: Chest Pain  HPI:  HPI Comments: Carrie [name] is a 71 y.o. female with Hx of A-fib, HLD, Cardiac ablation,  who presents to the Emergency Department complaining of intermittent left sided chest pain that started around 1400. Pt describes the pain as "burning" and as a "twinge". During onset, the pain lasted less than one minute and then subsided. She has had this pain intermittently since her symptoms started this evening. This chest pain is not brought on during exertion. Pt also notes an episode of diaphoresis today but states that this was not brought on by her chest pain. She further notes that she has had intermittent episodes of "cold sweats" during her sleep. Pt further notes a "tightness" across her chest that has been occurring intermittently for the past month, specifically during exertion. She is not experiencing this chest tightness currently. No Hx of stress test. She has had a previous cardiac catheterization over 10 years ago without stents. She is currently taking Metoprolol, Eliquis and Crestor. No SOB, nausea or vomiting. No dizziness.    Cardiologist: Dr. Ronnald Ramp with [facility]   ROS: See HPI Constitutional: no fever  Eyes: no drainage  ENT: no runny nose   Cardiovascular:   chest pain  Resp: no SOB  GI: no vomiting GU: no dysuria Integumentary: no rash  Allergy: no hives  Musculoskeletal: no leg swelling  Neurological: no slurred speech ROS otherwise negative  PAST MEDICAL HISTORY/PAST SURGICAL HISTORY:  Past Medical History:  Diagnosis Date  . Aortic stenosis   . Contact lens/glasses fitting    contacts or gl;asses  . Coronary artery disease   . Osteoporosis, unspecified   . Other and unspecified  hyperlipidemia   . Other specified cardiac dysrhythmias(427.89)   . Paroxysmal atrial flutter ([place])   . Undiagnosed cardiac murmurs   . Wears hearing aid    left    MEDICATIONS:  Prior to Admission medications   Medication Sig Start Date End Date Taking? Authorizing Provider  CALCIUM PO Take by mouth daily.    [provider]  Cholecalciferol (VITAMIN D3) 1000 UNITS CAPS Take 1 capsule by mouth daily.      [provider]  clobetasol ointment (TEMOVATE) 6.96 % Apply 1 application topically 2 (two) times daily. 10/18/16   [name], MD  ELIQUIS 5 MG TABS tablet TAKE 1 TABLET (5 MG TOTAL) BY MOUTH 2 (TWO) TIMES DAILY. 04/02/16   Larey Dresser, MD  fish oil-omega-3 fatty acids 1000 MG capsule Take 1 g by mouth daily.      [provider]  metoprolol succinate (TOPROL-XL) 50 MG 24 hr tablet Take 1 tablet (50 mg total) by mouth daily. Take with or immediately following a meal. 07/08/16 10/06/16  End, Harrell Gave, MD  nitroGLYCERIN (NITROLINGUAL) 0.4 MG/SPRAY spray Place 1 spray under the tongue as directed.      [provider]  predniSONE (DELTASONE) 10 MG tablet Take 4 tabs po qd x 3 days, then 3 tabs po qd x 3 days, then 2 tabs po qd x 3 days, then 1 tab po qd x 3 days 10/21/16   Binnie Rail, MD  rosuvastatin (CRESTOR) 10 MG tablet Take 1 tablet (10 mg total) by mouth daily. 09/16/16  12/15/16  End, Harrell Gave, MD  Thiamine HCl (VITAMIN B-1) 250 MG tablet Take 500 mg by mouth daily.      [provider]    ALLERGIES:  Allergies  Allergen Reactions  . Codeine Nausea And Vomiting  . Crestor [Rosuvastatin] Other (See Comments)    Crestor 40mg  fatigue and exhaustion--pt tolerates crestor 20mg     SOCIAL HISTORY:  Social History  Substance Use Topics  . Smoking status: Former Smoker    Quit date: 04/05/1999  . Smokeless tobacco: Never Used     Comment: Regular Exercise-No  . Alcohol use 4.2 oz/week    7 Glasses of wine per week     FAMILY HISTORY: Family History  Problem Relation Age of Onset  . Heart disease Mother 63       CABG and valve replacement; CHF  . Congestive Heart Failure Mother   . Hypertension Other     EXAM: BP 135/80 (BP Location: Left Arm)   Pulse 96   Temp (!) 97.4 F (36.3 C) (Oral)   Resp 18   SpO2 95%  CONSTITUTIONAL: Elderly, alert and oriented and responds appropriately to questions. Well-appearing; well-nourished HEAD: Normocephalic EYES: Conjunctivae clear, pupils appear equal, EOMI ENT: normal nose; moist mucous membranes NECK: Supple, no meningismus, no nuchal rigidity, no LAD  CARD: RRR; S1 and S2 appreciated; no murmurs, no clicks, no rubs, no gallops RESP: Normal chest excursion without splinting or tachypnea; breath sounds clear and equal bilaterally; no wheezes, no rhonchi, no rales, no hypoxia or respiratory distress, speaking full sentences ABD/GI: Normal bowel sounds; non-distended; soft, non-tender, no rebound, no guarding, no peritoneal signs, no hepatosplenomegaly BACK:  The back appears normal and is non-tender to palpation, there is no CVA tenderness EXT: Normal ROM in all joints; non-tender to palpation; no edema; normal capillary refill; no cyanosis, no calf tenderness or swelling    SKIN: Normal color for age and race; warm; no rash NEURO: Moves all extremities equally PSYCH: The patient's mood and manner are appropriate. Grooming and personal hygiene are appropriate.  MEDICAL DECISION MAKING: Patient here with complaints of very atypical "twinge" and "burning" chest pain that only last for several seconds at a time but also exertional chest tightness intermittently for the past month. Did have diaphoresis today. Currently asymptomatic. Concern that this exertional chest tightness could be her anginal equivalent. Her first troponin is negative and chest x-ray is clear. She is a sinus rhythm and is rate controlled. I recommended admission for chest pain rule out.  Patient and family comfortable with this plan. We'll give aspirin. Primary care physician is with [facility].  ED PROGRESS:    2:34 AM Discussed patient's case with hospitalist, Dr. Loleta Books.  I have recommended admission and patient (and family if present) agree with this plan. Admitting physician will place admission orders.   I reviewed all nursing notes, vitals, pertinent previous records, EKGs, lab and urine results, imaging (as available).    EKG Interpretation  Date/Time:  Wednesday November 10 2016 23:25:43 EDT Ventricular Rate:  91 PR Interval:  128 QRS Duration: 102 QT Interval:  364 QTC Calculation: 447 R Axis:   -18 Text Interpretation:  Normal sinus rhythm Possible Left atrial enlargement Left ventricular hypertrophy with repolarization abnormality Abnormal ECG Confirmed by Pryor Curia [phone]) on 11/11/2016 1:38:58 AM        This chart was scribed in my presence and reviewed by me personally.    Kevina Piloto, Delice Bison, DO 11/11/16 [id number]

## 2016-11-11 NOTE — Discharge Instructions (Signed)

## 2016-11-11 NOTE — Progress Notes (Signed)
  Echocardiogram 2D Echocardiogram has been performed.  Carrie [name] Carrie [name] 11/11/2016, 12:13 PM

## 2016-11-12 ENCOUNTER — Telehealth: Payer: Self-pay | Admitting: *Deleted

## 2016-11-12 NOTE — Telephone Encounter (Signed)
Transition Care Management Follow-up Telephone Call   Date discharged? 11/11/16   How have you been since you were released from the hospital? Pt states she is doing ok   Do you understand why you were in the hospital? YES   Do you understand the discharge instructions? YES   Where were you discharged to? Home   Items Reviewed:  Medications reviewed: YES  Allergies reviewed: YES  Dietary changes reviewed: NO  Referrals reviewed: YES, cardiology appt on 11/22/16   Functional Questionnaire:   Activities of Daily Living (ADLs):   She states she are independent in the following: ambulation, bathing and hygiene, feeding, continence, grooming, toileting and dressing States she doesn't require assistance    Any transportation issues/concerns?: NO   Any patient concerns? NO   Confirmed importance and date/time of follow-up visits scheduled YES, appt 11/17/16  Provider Appointment booked with Dr. Ronnald Ramp  Confirmed with patient if condition begins to worsen call PCP or go to the ER.  Patient was given the office number and encouraged to call back with question or concerns.  : YES

## 2016-11-17 ENCOUNTER — Ambulatory Visit (INDEPENDENT_AMBULATORY_CARE_PROVIDER_SITE_OTHER): Payer: Medicare Other | Admitting: Internal Medicine

## 2016-11-17 ENCOUNTER — Encounter: Payer: Self-pay | Admitting: Internal Medicine

## 2016-11-17 VITALS — BP 130/80 | HR 64 | Temp 98.3°F | Resp 16 | Ht 62.0 in | Wt 135.0 lb

## 2016-11-17 DIAGNOSIS — I4892 Unspecified atrial flutter: Secondary | ICD-10-CM | POA: Diagnosis not present

## 2016-11-17 DIAGNOSIS — I2583 Coronary atherosclerosis due to lipid rich plaque: Secondary | ICD-10-CM

## 2016-11-17 DIAGNOSIS — J301 Allergic rhinitis due to pollen: Secondary | ICD-10-CM | POA: Diagnosis not present

## 2016-11-17 DIAGNOSIS — I2510 Atherosclerotic heart disease of native coronary artery without angina pectoris: Secondary | ICD-10-CM

## 2016-11-17 MED ORDER — AZELASTINE HCL 0.1 % NA SOLN: 2.0000 | Freq: Two times a day (BID) | NASAL | 5 refills | Status: DC

## 2016-11-17 NOTE — Progress Notes (Signed)
Subjective:  Patient ID: Carrie [name], female    DOB: [birth year]  Age: 71 y.o. MRN: [id number]  CC: Allergic Rhinitis    HPI Carrie [name] presents for hosp f/up - She was admitted for chest pain and ruled out for ischemia. Since discharge she said no recurrences of chest pain and denies shortness of breath, diaphoresis, or fatigue. She complains of chronic runny nose with postnasal drip. She has not tried anything to control the symptoms.  Outpatient Medications Prior to Visit  Medication Sig Dispense Refill  . CALCIUM PO Take by mouth daily.    . Cholecalciferol (VITAMIN D3) 1000 UNITS CAPS Take 1 capsule by mouth daily.      Marland Kitchen ELIQUIS 5 MG TABS tablet TAKE 1 TABLET (5 MG TOTAL) BY MOUTH 2 (TWO) TIMES DAILY. 60 tablet 10  . fish oil-omega-3 fatty acids 1000 MG capsule Take 1 g by mouth daily.      . rosuvastatin (CRESTOR) 10 MG tablet Take 1 tablet (10 mg total) by mouth daily. 90 tablet 3  . Thiamine HCl (VITAMIN B-1) 250 MG tablet Take 500 mg by mouth daily.      . metoprolol succinate (TOPROL-XL) 50 MG 24 hr tablet Take 1 tablet (50 mg total) by mouth daily. Take with or immediately following a meal. 90 tablet 1  . nitroGLYCERIN (NITROLINGUAL) 0.4 MG/SPRAY spray Place 1 spray under the tongue every 5 (five) minutes x 3 doses as needed for chest pain.      No facility-administered medications prior to visit.     ROS Review of Systems  Constitutional: Negative.  Negative for chills, diaphoresis and fatigue.  HENT: Positive for postnasal drip and rhinorrhea. Negative for congestion, ear pain, facial swelling, nosebleeds, sinus pain, sinus pressure, sneezing, sore throat and tinnitus.   Eyes: Negative.   Respiratory: Negative.  Negative for cough, chest tightness, shortness of breath and wheezing.   Cardiovascular: Negative for chest pain, palpitations and leg swelling.  Gastrointestinal: Negative for abdominal pain, constipation, diarrhea, nausea and vomiting.  Endocrine:  Negative.   Genitourinary: Negative.  Negative for difficulty urinating.  Musculoskeletal: Negative.   Skin: Negative.   Allergic/Immunologic: Negative.   Neurological: Negative.  Negative for dizziness, weakness and headaches.  Hematological: Negative for adenopathy. Does not bruise/bleed easily.  Psychiatric/Behavioral: Negative.     Objective:  BP 130/80 (BP Location: Left Arm, Patient Position: Sitting, Cuff Size: Normal)   Pulse 64   Temp 98.3 F (36.8 C) (Oral)   Resp 16   Ht 5\' 2"  (1.575 m)   Wt 135 lb (61.2 kg)   SpO2 98%   BMI 24.69 kg/m   BP Readings from Last 3 Encounters:  11/17/16 130/80  11/11/16 128/62  10/26/16 138/72    Wt Readings from Last 3 Encounters:  11/17/16 135 lb (61.2 kg)  11/11/16 133 lb 11.2 oz (60.6 kg)  10/26/16 137 lb 4.8 oz (62.3 kg)    Physical Exam  Constitutional: She is oriented to person, place, and time. No distress.  HENT:  Nose: Mucosal edema present. No rhinorrhea or sinus tenderness. No epistaxis. Right sinus exhibits no maxillary sinus tenderness and no frontal sinus tenderness. Left sinus exhibits no maxillary sinus tenderness and no frontal sinus tenderness.  Mouth/Throat: Oropharynx is clear and moist. No oropharyngeal exudate.  Eyes: Conjunctivae are normal. Right eye exhibits no discharge. Left eye exhibits no discharge. No scleral icterus.  Neck: Normal range of motion. Neck supple. No JVD present. No thyromegaly present.  Cardiovascular:  Normal rate, regular rhythm, S1 normal, S2 normal and intact distal pulses.  Exam reveals no gallop and no friction rub.   Murmur heard.  Systolic murmur is present with a grade of 3/6   No diastolic (3/6 SEM) murmur is present  Pulmonary/Chest: Effort normal and breath sounds normal. No respiratory distress. She has no wheezes. She has no rales. She exhibits no tenderness.  Abdominal: Soft. Bowel sounds are normal. She exhibits no distension. There is no tenderness. There is no rebound  and no guarding.  Musculoskeletal: Normal range of motion. She exhibits no edema, tenderness or deformity.  Lymphadenopathy:    She has no cervical adenopathy.  Neurological: She is alert and oriented to person, place, and time.  Skin: Skin is warm and dry. No rash noted. She is not diaphoretic. No erythema. No pallor.  Vitals reviewed.   Lab Results  Component Value Date   WBC 10.3 11/10/2016   HGB 13.9 11/10/2016   HCT 41.2 11/10/2016   PLT 281 11/10/2016   GLUCOSE 121 (H) 11/10/2016   CHOL 281 (H) 09/13/2016   TRIG 238 (H) 09/13/2016   HDL 73 09/13/2016   LDLDIRECT 133.8 12/19/2012   LDLCALC 160 (H) 09/13/2016   ALT 44 (H) 10/18/2016   AST 43 (H) 10/18/2016   NA 136 11/10/2016   K 3.7 11/10/2016   CL 104 11/10/2016   CREATININE 0.65 11/10/2016   BUN 18 11/10/2016   CO2 24 11/10/2016   TSH 0.79 01/20/2016    No results found.  Assessment & Plan:   Carrie [name] was seen today for allergic rhinitis .  Diagnoses and all orders for this visit:  Non-seasonal allergic rhinitis due to pollen -     azelastine (ASTELIN) 0.1 % nasal spray; Place 2 sprays into both nostrils 2 (two) times daily. Use in each nostril as directed  Coronary artery disease due to lipid rich plaque- she's had no recurrences of chest pain or any other symptoms suspicious of angina. Will continue risk factor modification with the statin. She will use nitroglycerin as needed. Will also continue the beta blocker.  Paroxysmal atrial flutter ([place])- she has had no recent palpitations and is maintaining sinus rhythm. Will continue anticoagulation with Eliquis.   I am having Carrie [name] start on azelastine. I am also having her maintain her fish oil-omega-3 fatty acids, nitroGLYCERIN, vitamin B-1, Vitamin D3, CALCIUM PO, ELIQUIS, metoprolol succinate, and rosuvastatin.  Meds ordered this encounter  Medications  . azelastine (ASTELIN) 0.1 % nasal spray    Sig: Place 2 sprays into both nostrils 2 (two) times  daily. Use in each nostril as directed    Dispense:  30 mL    Refill:  5     Follow-up: Return if symptoms worsen or fail to improve.  Scarlette Calico, MD

## 2016-11-17 NOTE — Patient Instructions (Signed)
Allergic Rhinitis Allergic rhinitis is when the mucous membranes in the nose respond to allergens. Allergens are particles in the air that cause your body to have an allergic reaction. This causes you to release allergic antibodies. Through a chain of events, these eventually cause you to release histamine into the blood stream. Although meant to protect the body, it is this release of histamine that causes your discomfort, such as frequent sneezing, congestion, and an itchy, runny nose. What are the causes? Seasonal allergic rhinitis (hay fever) is caused by pollen allergens that may come from grasses, trees, and weeds. Year-round allergic rhinitis (perennial allergic rhinitis) is caused by allergens such as house dust mites, pet dander, and mold spores. What are the signs or symptoms?  Nasal stuffiness (congestion).  Itchy, runny nose with sneezing and tearing of the eyes. How is this diagnosed? Your health care provider can help you determine the allergen or allergens that trigger your symptoms. If you and your health care provider are unable to determine the allergen, skin or blood testing may be used. Your health care provider will diagnose your condition after taking your health history and performing a physical exam. Your health care provider may assess you for other related conditions, such as asthma, pink eye, or an ear infection. How is this treated? Allergic rhinitis does not have a cure, but it can be controlled by:  Medicines that block allergy symptoms. These may include allergy shots, nasal sprays, and oral antihistamines.  Avoiding the allergen. Hay fever may often be treated with antihistamines in pill or nasal spray forms. Antihistamines block the effects of histamine. There are over-the-counter medicines that may help with nasal congestion and swelling around the eyes. Check with your health care provider before taking or giving this medicine. If avoiding the allergen or the  medicine prescribed do not work, there are many new medicines your health care provider can prescribe. Stronger medicine may be used if initial measures are ineffective. Desensitizing injections can be used if medicine and avoidance does not work. Desensitization is when a patient is given ongoing shots until the body becomes less sensitive to the allergen. Make sure you follow up with your health care provider if problems continue. Follow these instructions at home: It is not possible to completely avoid allergens, but you can reduce your symptoms by taking steps to limit your exposure to them. It helps to know exactly what you are allergic to so that you can avoid your specific triggers. Contact a health care provider if:  You have a fever.  You develop a cough that does not stop easily (persistent).  You have shortness of breath.  You start wheezing.  Symptoms interfere with normal daily activities. This information is not intended to replace advice given to you by your health care provider. Make sure you discuss any questions you have with your health care provider. Document Released: 01/05/2001 Document Revised: 12/12/2015 Document Reviewed: 12/18/2012 Elsevier Interactive Patient Education  2017 [employer]  

## 2016-11-22 ENCOUNTER — Other Ambulatory Visit: Payer: Medicare Other | Admitting: *Deleted

## 2016-11-22 ENCOUNTER — Ambulatory Visit (INDEPENDENT_AMBULATORY_CARE_PROVIDER_SITE_OTHER): Payer: Medicare Other | Admitting: Internal Medicine

## 2016-11-22 ENCOUNTER — Encounter: Payer: Self-pay | Admitting: Internal Medicine

## 2016-11-22 VITALS — BP 116/64 | HR 75 | Ht 62.0 in | Wt 136.3 lb

## 2016-11-22 DIAGNOSIS — I4892 Unspecified atrial flutter: Secondary | ICD-10-CM

## 2016-11-22 DIAGNOSIS — I25119 Atherosclerotic heart disease of native coronary artery with unspecified angina pectoris: Secondary | ICD-10-CM

## 2016-11-22 DIAGNOSIS — E785 Hyperlipidemia, unspecified: Secondary | ICD-10-CM

## 2016-11-22 DIAGNOSIS — I421 Obstructive hypertrophic cardiomyopathy: Secondary | ICD-10-CM | POA: Insufficient documentation

## 2016-11-22 DIAGNOSIS — I422 Other hypertrophic cardiomyopathy: Secondary | ICD-10-CM | POA: Insufficient documentation

## 2016-11-22 DIAGNOSIS — I350 Nonrheumatic aortic (valve) stenosis: Secondary | ICD-10-CM

## 2016-11-22 LAB — HEPATIC FUNCTION PANEL
ALBUMIN: 4 g/dL (ref 3.5–4.8)
ALT: 17 IU/L (ref 0–32)
AST: 20 IU/L (ref 0–40)
Alkaline Phosphatase: 72 IU/L (ref 39–117)
BILIRUBIN TOTAL: 0.4 mg/dL (ref 0.0–1.2)
Bilirubin, Direct: 0.09 mg/dL (ref 0.00–0.40)
TOTAL PROTEIN: 6.9 g/dL (ref 6.0–8.5)

## 2016-11-22 LAB — LIPID PANEL
CHOL/HDL RATIO: 2.4 ratio (ref 0.0–4.4)
CHOLESTEROL TOTAL: 192 mg/dL (ref 100–199)
HDL: 81 mg/dL (ref >39)
LDL CALC: 89 mg/dL (ref 0–99)
Triglycerides: 111 mg/dL (ref 0–149)
VLDL Cholesterol Cal: 22 mg/dL (ref 5–40)

## 2016-11-22 NOTE — Progress Notes (Signed)
Follow-up Outpatient Visit Date: 11/22/2016  Primary Care Provider: [name], MD [address] [address]  Chief Complaint: Follow-up recent hospitalization for chest pain.  HPI:  Ms. Balzarini is a 71 y.o. year-old female with history of non-obstructive coronary artery disease, paroxysmal atrial flutter, aortic stenosis (moderate by echo in 01/2016), who presents for follow-up of aortic stenosis and hyperlipidemia. I last saw her on 07/16/16, at which time we reviewed her elevated ALT in the setting of statin therapy. Mild elevation in ALT persisted despite statin holiday. Workup for causes of transaminitis by Ms. Bowe's PCP was unrevealing. She was restarted on rosuvastatin 10 mg daily by the lipid clinic in May.  Ms. Hannen was hospitalized overnight on 11/11/16 for chest pain. She was experiencing intermittent "twinges" of left sided chest pain that she describes as burning and fluttering. He daughter checked Ms. Maffett's heart rate at home and noted it to be ~100 bpm. She proceeded to the ED and was observed overnight. Repeat echo showed stable moderate aortic stenosis and asymmetric left ventricular thickening with dynamic intracavitary gradient. Her symptoms resolved spontaneously and additional testing was deferred. Since leaving the hospital, Ms. Buxbaum has felt well without further episodes of chest pain or flutters. She denies shortness of breath, lightheadedness, edema, and bleeding. She remains compliant with her medications, though she is concerned about the cost of apixaban as she approaches the donut hole. She is tolerating rosuvastatin 10 mg daily without any myalgias or other side effects.  --------------------------------------------------------------------------------------------------  Cardiovascular History & Procedures: Cardiovascular Problems:  Paroxysmal atrial flutter  Non-obstructive coronary artery disease  Aortic stenosis  Risk  Factors:  Known CAD and age > 16  Cath/PCI:  LHC (07/24/03): LMCA heavily calcified without significant stenosis. LAD with heavy calcification proximally. 50-60% stenosis just distal to D2. LCx calcified proximally without significant stenosis. Dominant RCA with moderate proximal calcification and mild luminal irregularities throughout. LVEDP 12. LVEF 70%.  CV Surgery:  None  EP Procedures and Devices:  30-day event monitor (12/17/15): Predominantly sinus rhythm with episode of atrial flutter with rate ~100 bpm.  Non-Invasive Evaluation(s):  TTE (11/11/16): Normal LV size with mild focal basal hypertrophy and severe hypertrophy of the inferior wall. LVEF 55-60% with dynamic obstruction. Moderate aortic stenosis (mean gradient 25 mmHg). Mild MR. Moderate left atrial enlargement. Normal RV size and wall thickness. Mild TR. Upper normal PA pressure.  TTE (02/10/16): Normal LV size with mild LVH and focal basal hypertrophy of the septum. LVEF 55-60% with grade 1 diastolic dysfunction and elevated filling pressure. Calcified aortic valve with moderate stenosis (mean gradient 29 mmHg, AVA 1.01 cm^2). MAC with chordal SAM and mild MR. Mild TR. Normal RV size and function.  Recent CV Pertinent Labs: Lab Results  Component Value Date   CHOL 281 (H) 09/13/2016   HDL 73 09/13/2016   LDLCALC 160 (H) 09/13/2016   LDLDIRECT 133.8 12/19/2012   TRIG 238 (H) 09/13/2016   CHOLHDL 3.8 09/13/2016   CHOLHDL 2.3 08/18/2015   K 3.7 11/10/2016   BUN 18 11/10/2016   BUN 19 09/13/2016   CREATININE 0.65 11/10/2016   CREATININE 0.75 03/16/2016    Past medical and surgical history were reviewed and updated in EPIC.  Outpatient Encounter Prescriptions as of 11/22/2016  Medication Sig  . azelastine (ASTELIN) 0.1 % nasal spray Place 2 sprays into both nostrils 2 (two) times daily. Use in each nostril as directed  . CALCIUM PO Take by mouth daily.  Marland Kitchen  Cholecalciferol (VITAMIN D3) 1000 UNITS CAPS Take 1  capsule by mouth daily.    Marland Kitchen ELIQUIS 5 MG TABS tablet TAKE 1 TABLET (5 MG TOTAL) BY MOUTH 2 (TWO) TIMES DAILY.  . fish oil-omega-3 fatty acids 1000 MG capsule Take 1 g by mouth daily.    . nitroGLYCERIN (NITROLINGUAL) 0.4 MG/SPRAY spray Place 1 spray under the tongue every 5 (five) minutes x 3 doses as needed for chest pain.   . rosuvastatin (CRESTOR) 10 MG tablet Take 1 tablet (10 mg total) by mouth daily.  . Thiamine HCl (VITAMIN B-1) 250 MG tablet Take 500 mg by mouth daily.    . metoprolol succinate (TOPROL-XL) 50 MG 24 hr tablet Take 1 tablet (50 mg total) by mouth daily. Take with or immediately following a meal.   No facility-administered encounter medications on file as of 11/22/2016.     Allergies: Codeine; Crestor [rosuvastatin]; and Latex  Social History   Social History  . Marital status: Divorced    Spouse name: N/A  . Number of children: N/A  . Years of education: N/A   Occupational History  . Not on file.   Social History Main Topics  . Smoking status: Former Smoker    Quit date: 04/05/1999  . Smokeless tobacco: Never Used     Comment: Regular Exercise-No  . Alcohol use 4.2 oz/week    7 Glasses of wine per week     Comment: 1 drink per week  . Drug use: No  . Sexual activity: Not Currently   Other Topics Concern  . Not on file   Social History Narrative  . No narrative on file    Family History  Problem Relation Age of Onset  . Heart disease Mother 34  . Congestive Heart Failure Mother   . Dementia Mother   . Hypertension Other   . Valvular heart disease Father        Had valves replaced    Review of Systems: Abdominal fullness/tightness with eating and sometimes with walking. Otherwise, a 12-system ROS was performed and was negative except as noted in the HPI.  --------------------------------------------------------------------------------------------------  Physical Exam: BP 116/64 (BP Location: Left Arm, Patient Position: Sitting, Cuff Size:  Normal)   Pulse 75   Ht _0  (1.575 m)   Wt 136 lb 4.8 oz (61.8 kg)   SpO2 97%   BMI 24.93 kg/m    General:  Well-developed, well-nourished woman, seated comfortably in the exam room. HEENT: No conjunctival pallor or scleral icterus.  Moist mucous membranes.  OP clear. Neck: Supple without lymphadenopathy, thyromegaly, JVD, or HJR.  Lungs: Normal work of breathing.  Clear to auscultation bilaterally without wheezes or crackles. Heart: Regular rate and rhythm with 3/6 crescendo-decrescendo murmur loudest at the RUSB with radiation across the precordium and to the carotids. No rubs or gallops.  Non-displaced PMI. Abd: Bowel sounds present.  Soft, NT/ND without hepatosplenomegaly Ext: No lower extremity edema.  Radial, PT, and DP pulses are 2+ bilaterally. Skin: Warm and dry without rash.  EKG: Sinus bradycardia (HR 58 bpm). No significant abnormalities.  Lab Results  Component Value Date   WBC 10.3 11/10/2016   HGB 13.9 11/10/2016   HCT 41.2 11/10/2016   MCV 92.4 11/10/2016   PLT 281 11/10/2016    Lab Results  Component Value Date   NA 136 11/10/2016   K 3.7 11/10/2016   CL 104 11/10/2016   CO2 24 11/10/2016   BUN 18 11/10/2016   CREATININE 0.65 11/10/2016  GLUCOSE 121 (H) 11/10/2016   ALT 44 (H) 10/18/2016    Lab Results  Component Value Date   CHOL 281 (H) 09/13/2016   HDL 73 09/13/2016   LDLCALC 160 (H) 09/13/2016   LDLDIRECT 133.8 12/19/2012   TRIG 238 (H) 09/13/2016   CHOLHDL 3.8 09/13/2016    --------------------------------------------------------------------------------------------------  ASSESSMENT AND PLAN: Coronary artery disease with atypical angina Recent episode of chest pain was atypical, with fluttering and burning that would come and go. It was not associated with dyspnea or activity. Hospital workup was unrevealing, though ischemia evaluation was not performed. Given the nature of her symptoms, report of elevated heart rate, and history of  paroxysmal atrial flutter, I am most suspicious for an arrhythmic cause of her symptoms. We discussed ischemia evaluation, including MPI and cardiac catheterization, and have agreed to defer both. In the setting of dynamic LV intracavitary gradient and moderate AS, I am somewhat hesitant to perform exercise and pharmacologic stress testing, though I suspect that she would tolerate regadenoson. If she has recurrent symptoms, we will need to readdress ischemia testing.  Aortic stenosis No symptoms to suggest severe AS. Echo during recent hospitalization showed stable moderate AS.  HOCM No worrisome symptoms reported other than atypical chest pain. Ms. Trouten has not had any lightheadedness, syncope, or heart failure symptoms. We will continue her current dose of metoprolol, as HR on EKG today is 58 bpm.  Paroxysmal atrial flutter I suspect that her recent "twinges" of chest discomfort and fluttering may have been due to an episode of a-flutter, though no arrhythmias were noted during her hospitalization. We will continue with apixaban, given CHADSVASC score of 3.  Hyperlipidemia No side effects from rosuvastatin 10 mg daily. We will check fasting lipid panel and ALT today.  Follow-up: Return to clinic in 3 months.  Nelva Bush, MD 11/22/2016 8:25 AM

## 2016-11-22 NOTE — Progress Notes (Signed)
D

## 2016-11-22 NOTE — Patient Instructions (Addendum)
Medication Instructions:  Your physician recommends that you continue on your current medications as directed. Please refer to the Current Medication list given to you today.   Labwork: Your physician recommends that you return for a FASTING lipid profile /ALT today.   Testing/Procedures: None   Follow-Up: Your physician recommends that you schedule a follow-up appointment in: 3 months --this is scheduled for Thursday February 24, 2017 at 10:20 AM.        If you need a refill on your cardiac medications before your next appointment, please call your pharmacy.

## 2016-11-23 ENCOUNTER — Telehealth: Payer: Self-pay | Admitting: Internal Medicine

## 2016-11-23 NOTE — Telephone Encounter (Signed)
Follow Up:; ° ° °Returning your call. °

## 2016-11-23 NOTE — Telephone Encounter (Signed)
Spoke with patient about recent lab results 

## 2016-11-25 ENCOUNTER — Telehealth: Payer: Self-pay

## 2016-11-25 NOTE — Telephone Encounter (Signed)
Patient is requesting reverfication of benefits for prolia injection---she has had several doctor office visits since I last verified benefits in may/2018---need to see if deductible has been met---I will reverify and call patient back with findings

## 2016-11-29 ENCOUNTER — Other Ambulatory Visit: Payer: Medicare Other

## 2016-12-22 ENCOUNTER — Telehealth: Payer: Self-pay

## 2016-12-22 NOTE — Telephone Encounter (Signed)
Left message advising patient that her insurance has been re-run per her request----looks like her deductible $183 has been satisfied, she will owe estimated $220 copay to get injection---can schedule anytime at her earliest convenience--can talk with Balin Vandegrift if any questions

## 2016-12-23 NOTE — Telephone Encounter (Signed)
Patient requesting call back in regard at [phone].

## 2016-12-24 NOTE — Telephone Encounter (Signed)
Pt called back with some questions regarding the injection.

## 2016-12-29 ENCOUNTER — Other Ambulatory Visit (HOSPITAL_COMMUNITY): Payer: Medicare Other

## 2016-12-29 NOTE — Telephone Encounter (Signed)
Patient had a few more questions about prolia, I have answered those questions

## 2016-12-29 NOTE — Telephone Encounter (Signed)
Pt called regarding this please call back

## 2017-01-06 ENCOUNTER — Other Ambulatory Visit: Payer: Self-pay | Admitting: Internal Medicine

## 2017-01-06 DIAGNOSIS — E785 Hyperlipidemia, unspecified: Secondary | ICD-10-CM

## 2017-01-06 DIAGNOSIS — I350 Nonrheumatic aortic (valve) stenosis: Secondary | ICD-10-CM

## 2017-01-10 ENCOUNTER — Ambulatory Visit: Payer: Medicare Other | Admitting: Internal Medicine

## 2017-01-13 ENCOUNTER — Ambulatory Visit (INDEPENDENT_AMBULATORY_CARE_PROVIDER_SITE_OTHER): Payer: Medicare Other | Admitting: General Practice

## 2017-01-13 DIAGNOSIS — Z5181 Encounter for therapeutic drug level monitoring: Secondary | ICD-10-CM | POA: Diagnosis not present

## 2017-01-13 DIAGNOSIS — Z79899 Other long term (current) drug therapy: Secondary | ICD-10-CM | POA: Diagnosis not present

## 2017-01-13 MED ORDER — DENOSUMAB 60 MG/ML ~~LOC~~ SOLN
60.0000 mg | Freq: Once | SUBCUTANEOUS | Status: AC
  Administered 2017-01-13: 60 mg via SUBCUTANEOUS

## 2017-01-18 NOTE — Progress Notes (Signed)
Pre visit review using our clinic review tool, if applicable. No additional management support is needed unless otherwise documented below in the visit note. 

## 2017-01-18 NOTE — Progress Notes (Addendum)
Subjective:   Carrie [name] is a 71 y.o. female who presents for Medicare Annual (Subsequent) preventive examination.  Review of Systems:  No ROS.  Medicare Wellness Visit. Additional risk factors are reflected in the social history.  Cardiac Risk Factors include: advanced age (>14men, >70 women);dyslipidemia;hypertension Sleep patterns: feels rested on waking, gets up 1-2 times nightly to void and sleeps 7-8 hours nightly.    Home Safety/Smoke Alarms: Feels safe in home. Smoke alarms in place.  Living environment; residence and Firearm Safety: 1-story house/ trailer, no firearms. Lives alone, no needs for DME, good support system Seat Belt Safety/Bike Helmet: Wears seat belt.     Objective:     Vitals: BP 134/60   Pulse 63   Resp 20   Ht 5\' 2"  (1.575 m)   Wt 136 lb (61.7 kg)   SpO2 97%   BMI 24.87 kg/m   Body mass index is 24.87 kg/m.   Tobacco History  Smoking Status  . Former Smoker  . Quit date: 04/05/1999  Smokeless Tobacco  . Never Used    Comment: Regular Exercise-No     Counseling given: Not Answered   Past Medical History:  Diagnosis Date  . Aortic stenosis   . Coronary artery disease    a. Prior LHC (07/24/03): LMCA heavily calcified without significant stenosis, LAD with heavy calcification proximally. 50-60% stenosis just distal to D2, LCx calcified proximally without significant stenosis, Dominant RCA with moderate proximal calcification and mild luminal irregularities throughout, LVEDP 12, LVEF 70%  . Diastolic dysfunction without heart failure   . Former tobacco use   . Former tobacco use   . HOCM (hypertrophic obstructive cardiomyopathy) ([place])   . Mild AI (aortic insufficiency)   . Osteoporosis, unspecified   . Other and unspecified hyperlipidemia   . Paroxysmal atrial flutter ([place])   . Wears hearing aid    left   Past Surgical History:  Procedure Laterality Date  . ATRIAL ABLATION SURGERY  2005  . BREAST SURGERY  1982   Benign tumor  removed RT  . BUNIONECTOMY  04/07/2012   Procedure: Lillard Anes;  Surgeon: Alta Corning, MD;  Location: [place];  Service: Orthopedics;  Laterality: Left;  CHEVRON OSTEOTOMY LEFT FOOT   . CARDIAC CATHETERIZATION  2005  . GANGLION CYST EXCISION     left wrist  . HERNIA REPAIR  1990   rt ing  . TONSILLECTOMY    . TUBAL LIGATION     Family History  Problem Relation Age of Onset  . Heart disease Mother 17  . Congestive Heart Failure Mother   . Dementia Mother   . Hypertension Other   . Valvular heart disease Father        Had valves replaced   History  Sexual Activity  . Sexual activity: Not Currently    Outpatient Encounter Prescriptions as of 01/19/2017  Medication Sig  . CALCIUM PO Take by mouth daily.  . Cholecalciferol (VITAMIN D3) 1000 UNITS CAPS Take 1 capsule by mouth daily.    Marland Kitchen ELIQUIS 5 MG TABS tablet TAKE 1 TABLET (5 MG TOTAL) BY MOUTH 2 (TWO) TIMES DAILY.  . fish oil-omega-3 fatty acids 1000 MG capsule Take 1 g by mouth daily.    . metoprolol succinate (TOPROL-XL) 50 MG 24 hr tablet TAKE 1 TABLET BY MOUTH DAILY. TAKE WITH OR IMMEDIATELY FOLLOWING A MEAL.  . nitroGLYCERIN (NITROLINGUAL) 0.4 MG/SPRAY spray Place 1 spray under the tongue every 5 (five) minutes x 3 doses  as needed for chest pain.   . Thiamine HCl (VITAMIN B-1) 250 MG tablet Take 500 mg by mouth daily.    . rosuvastatin (CRESTOR) 10 MG tablet Take 1 tablet (10 mg total) by mouth daily.  . [DISCONTINUED] azelastine (ASTELIN) 0.1 % nasal spray Place 2 sprays into both nostrils 2 (two) times daily. Use in each nostril as directed (Patient not taking: Reported on 01/19/2017)   No facility-administered encounter medications on file as of 01/19/2017.     Activities of Daily Living In your present state of health, do you have any difficulty performing the following activities: 01/19/2017 11/11/2016  Hearing? Tempie Donning  Vision? N N  Difficulty concentrating or making decisions? N N  Walking or  climbing stairs? N N  Dressing or bathing? N N  Doing errands, shopping? N N  Preparing Food and eating ? N -  Using the Toilet? N -  In the past six months, have you accidently leaked urine? N -  Do you have problems with loss of bowel control? N -  Managing your Medications? N -  Managing your Finances? N -  Housekeeping or managing your Housekeeping? N -  Some recent data might be hidden    Patient Care Team: [name], MD as PCP - General (Internal Medicine)    Assessment:    Physical assessment deferred to PCP.  Exercise Activities and Dietary recommendations Current Exercise Habits: Home exercise routine (discussed silver sneakers), Type of exercise: walking, Time (Minutes): 30, Frequency (Times/Week): 3, Weekly Exercise (Minutes/Week): 90, Intensity: Mild, Exercise limited by: None identified  Diet (meal preparation, eat out, water intake, caffeinated beverages, dairy products, fruits and vegetables): in general, a "healthy" diet  , well balanced, eats a variety of fruits and vegetables daily, limits salt, fat/cholesterol, sugar, caffeine, drinks 6-8 glasses of water daily.  Discussed weight loss tips, eating higher protein foods and decreasing carbohydrates. Diet education was attached to patient's AVS and via handout.   Goals      Patient Stated   . patient (pt-stated)          Wants to stay healthy; keep working some; Keep friends and may travel some;       Other   . Increase physical activity and start to play piano again          Look into joining [employer], do chair exercises by watching the program on TV, and start to play my son's piano for fun.      Fall Risk Fall Risk  01/19/2017 05/01/2016 04/22/2015  Falls in the past year? No No No   Depression Screen PHQ 2/9 Scores 01/19/2017 04/22/2015  PHQ - 2 Score 1 0  PHQ- 9 Score 1 -     Cognitive Function MMSE - Mini Mental State Exam 01/19/2017 04/22/2015  Not completed: - (No Data)    Orientation to time 5 -  Orientation to Place 5 -  Registration 3 -  Attention/ Calculation 5 -  Recall 2 -  Language- name 2 objects 2 -  Language- repeat 1 -  Language- follow 3 step command 3 -  Language- read & follow direction 1 -  Write a sentence 1 -  Copy design 1 -  Total score 29 -        Immunization History  Administered Date(s) Administered  . Influenza Whole 01/06/2010, 01/19/2012  . Influenza, High Dose Seasonal PF 01/23/2015, 01/20/2016  . Influenza,inj,Quad PF,6+ Mos 02/08/2014  . Pneumococcal Conjugate-13 04/22/2015  .  Pneumococcal Polysaccharide-23 06/22/2012  . Td 12/25/2009  . Zoster 12/25/2009   Screening Tests Health Maintenance  Topic Date Due  . INFLUENZA VACCINE  11/24/2016  . MAMMOGRAM  02/23/2018  . TETANUS/TDAP  12/26/2019  . COLONOSCOPY  01/17/2022  . DEXA SCAN  Completed  . Hepatitis C Screening  Completed  . PNA vac Low Risk Adult  Completed      Plan:     Continue doing brain stimulating activities (puzzles, reading, adult coloring books, staying active) to keep memory sharp.   Continue to eat heart healthy diet (full of fruits, vegetables, whole grains, lean protein, water--limit salt, fat, and sugar intake) and increase physical activity as tolerated.  Nurse will place Cologuard order as requested per patient for colon screening, patient declines colonoscopy.  I have personally reviewed and noted the following in the patient's chart:   . Medical and social history . Use of alcohol, tobacco or illicit drugs  . Current medications and supplements . Functional ability and status . Nutritional status . Physical activity . Advanced directives . List of other physicians . Vitals . Screenings to include cognitive, depression, and falls . Referrals and appointments  In addition, I have reviewed and discussed with patient certain preventive protocols, quality metrics, and best practice recommendations. A written personalized care  plan for preventive services as well as general preventive health recommendations were provided to patient.     Michiel Cowboy, RN  01/19/2017  Medical screening examination/treatment/procedure(s) were performed by non-physician practitioner and as supervising physician I was immediately available for consultation/collaboration. I agree with above. Scarlette Calico, MD

## 2017-01-19 ENCOUNTER — Ambulatory Visit (INDEPENDENT_AMBULATORY_CARE_PROVIDER_SITE_OTHER): Payer: Medicare Other | Admitting: *Deleted

## 2017-01-19 VITALS — BP 134/60 | HR 63 | Resp 20 | Ht 62.0 in | Wt 136.0 lb

## 2017-01-19 DIAGNOSIS — E2839 Other primary ovarian failure: Secondary | ICD-10-CM

## 2017-01-19 DIAGNOSIS — Z23 Encounter for immunization: Secondary | ICD-10-CM

## 2017-01-19 DIAGNOSIS — Z0000 Encounter for general adult medical examination without abnormal findings: Secondary | ICD-10-CM

## 2017-01-19 NOTE — Patient Instructions (Addendum)
Continue doing brain stimulating activities (puzzles, reading, adult coloring books, staying active) to keep memory sharp.   Continue to eat heart healthy diet (full of fruits, vegetables, whole grains, lean protein, water--limit salt, fat, and sugar intake) and increase physical activity as tolerated.   [name] , Thank you for taking time to come for your Medicare Wellness Visit. I appreciate your ongoing commitment to your health goals. Please review the following plan we discussed and let me know if I can assist you in the future.   These are the goals we discussed: Goals      Patient Stated   . patient (pt-stated)          Wants to stay healthy; keep working some; Keep friends and may travel some;       Other   . Increase physical activity and start to play piano again          Look into joining [employer], do chair exercise by watching the program on TV, and start to play my son's piano for fun.       This is a list of the screening recommended for you and due dates:  Health Maintenance  Topic Date Due  . Flu Shot  11/24/2016  . Mammogram  02/23/2018  . Tetanus Vaccine  12/26/2019  . Colon Cancer Screening  01/17/2022  . DEXA scan (bone density measurement)  Completed  .  Hepatitis C: One time screening is recommended by Center for Disease Control  (CDC) for  adults born from [birth year] through 1965.   Completed  . Pneumonia vaccines  Completed    Protein Content in Foods Generally, most healthy people need around 50 grams of protein each day. Depending on your overall health, you may need more or less protein in your diet. Talk to your health care provider or dietitian about how much protein you need. See the following list for the protein content of some common foods. High-protein foods High-protein foods contain 4 grams (4 g) or more of protein per serving. They include:  Beef, ground sirloin (cooked) - 3 oz have 24 g of protein.  Cheese (hard) - 1 oz has 7 g of  protein.  Chicken breast, boneless and skinless (cooked) - 3 oz have 13.4 g of protein.  Cottage cheese - 1/2 cup has 13.4 g of protein.  Egg - 1 egg has 6 g of protein.  Fish, filet (cooked) - 1 oz has 6-7 g of protein.  Garbanzo beans (canned or cooked) - 1/2 cup has 6-7 g of protein.  Kidney beans (canned or cooked) - 1/2 cup has 6-7 g of protein.  Lamb (cooked) - 3 oz has 24 g of protein.  Milk - 1 cup (8 oz) has 8 g of protein.  Nuts (peanuts, pistachios, almonds) - 1 oz has 6 g of protein.  Peanut butter - 1 oz has 7-8 g of protein.  Pork tenderloin (cooked) - 3 oz has 18.4 g of protein.  Pumpkin seeds - 1 oz has 8.5 g of protein.  Soybeans (roasted) - 1 oz has 8 g of protein.  Soybeans (cooked) - 1/2 cup has 11 g of protein.  Soy milk - 1 cup (8 oz) has 5-10 g of protein.  Soy or vegetable patty - 1 patty has 11 g of protein.  Sunflower seeds - 1 oz has 5.5 g of protein.  Tofu (firm) - 1/2 cup has 20 g of protein.  Tuna (canned in water) -  3 oz has 20 g of protein.  Yogurt - 6 oz has 8 g of protein.  Low-protein foods Low-protein foods contain 3 grams (3 g) or less of protein per serving. They include:  Beets (raw or cooked) - 1/2 cup has 1.5 g of protein.  Bran cereal - 1/2 cup has 2-3 g of protein.  Bread - 1 slice has 2.5 g of protein.  Broccoli (raw or cooked) - 1/2 cup has 2 g of protein.  Collard greens (raw or cooked) - 1/2 cup has 2 g of protein.  Corn (fresh or cooked) - 1/2 cup has 2 g of protein.  Cream cheese - 1 oz has 2 g of protein.  Creamer (half-and-half) - 1 oz has 1 g of protein.  Flour tortilla - 1 tortilla has 2.5 g of protein  Frozen yogurt - 1/2 cup has 3 g of protein.  Fruit or vegetable juice - 1/2 cup has 1 g of protein.  Green beans (raw or cooked) - 1/2 cup has 1 g of protein.  Green peas (canned) - 1/2 cup has 3.5 g of protein.  Muffins - 1 small muffin (2 oz) has 3 g of protein.  Oatmeal (cooked) - 1/2 cup  has 3 g of protein.  Potato (baked with skin) - 1 medium potato has 3 g of protein.  Rice (cooked) - 1/2 cup has 2.5-3.5 g of protein.  Sour cream - 1/2 cup has 2.5 g of protein.  Spinach (cooked) - 1/2 cup has 3 g of protein.  Squash (cooked) - 1/2 cup has 1.5 g of protein.  Actual amounts of protein may be different depending on processing. Talk with your health care provider or dietitian about what foods are recommended for you. This information is not intended to replace advice given to you by your health care provider. Make sure you discuss any questions you have with your health care provider. Document Released: 07/12/2015 Document Revised: 12/22/2015 Document Reviewed: 12/22/2015 Elsevier Interactive Patient Education  2018 [employer]. Influenza Virus Vaccine injection What is this medicine? INFLUENZA VIRUS VACCINE (in floo EN zuh VAHY ruhs vak SEEN) helps to reduce the risk of getting influenza also known as the flu. The vaccine only helps protect you against some strains of the flu. This medicine may be used for other purposes; ask your health care provider or pharmacist if you have questions. COMMON BRAND NAME(S): Afluria, Agriflu, Alfuria, FLUAD, Fluarix, Fluarix Quadrivalent, Flublok, Flublok Quadrivalent, FLUCELVAX, Flulaval, Fluvirin, Fluzone, Fluzone High-Dose, Fluzone Intradermal What should I tell my health care provider before I take this medicine? They need to know if you have any of these conditions: -bleeding disorder like hemophilia -fever or infection -Guillain-Barre syndrome or other neurological problems -immune system problems -infection with the human immunodeficiency virus (HIV) or AIDS -low blood platelet counts -multiple sclerosis -an unusual or allergic reaction to influenza virus vaccine, latex, other medicines, foods, dyes, or preservatives. Different brands of vaccines contain different allergens. Some may contain latex or eggs. Talk to your doctor  about your allergies to make sure that you get the right vaccine. -pregnant or trying to get pregnant -breast-feeding How should I use this medicine? This vaccine is for injection into a muscle or under the skin. It is given by a health care professional. A copy of Vaccine Information Statements will be given before each vaccination. Read this sheet carefully each time. The sheet may change frequently. Talk to your healthcare provider to see which vaccines are right for  you. Some vaccines should not be used in all age groups. Overdosage: If you think you have taken too much of this medicine contact a poison control center or emergency room at once. NOTE: This medicine is only for you. Do not share this medicine with others. What if I miss a dose? This does not apply. What may interact with this medicine? -chemotherapy or radiation therapy -medicines that lower your immune system like etanercept, anakinra, infliximab, and adalimumab -medicines that treat or prevent blood clots like warfarin -phenytoin -steroid medicines like prednisone or cortisone -theophylline -vaccines This list may not describe all possible interactions. Give your health care provider a list of all the medicines, herbs, non-prescription drugs, or dietary supplements you use. Also tell them if you smoke, drink alcohol, or use illegal drugs. Some items may interact with your medicine. What should I watch for while using this medicine? Report any side effects that do not go away within 3 days to your doctor or health care professional. Call your health care provider if any unusual symptoms occur within 6 weeks of receiving this vaccine. You may still catch the flu, but the illness is not usually as bad. You cannot get the flu from the vaccine. The vaccine will not protect against colds or other illnesses that may cause fever. The vaccine is needed every year. What side effects may I notice from receiving this medicine? Side  effects that you should report to your doctor or health care professional as soon as possible: -allergic reactions like skin rash, itching or hives, swelling of the face, lips, or tongue Side effects that usually do not require medical attention (report to your doctor or health care professional if they continue or are bothersome): -fever -headache -muscle aches and pains -pain, tenderness, redness, or swelling at the injection site -tiredness This list may not describe all possible side effects. Call your doctor for medical advice about side effects. You may report side effects to FDA at 1-800-FDA-1088. Where should I keep my medicine? The vaccine will be given by a health care professional in a clinic, pharmacy, doctor's office, or other health care setting. You will not be given vaccine doses to store at home. NOTE: This sheet is a summary. It may not cover all possible information. If you have questions about this medicine, talk to your doctor, pharmacist, or health care provider.  2018 Elsevier/Gold Standard (2014-11-01 10:07:28)

## 2017-01-26 ENCOUNTER — Ambulatory Visit (INDEPENDENT_AMBULATORY_CARE_PROVIDER_SITE_OTHER)
Admission: RE | Admit: 2017-01-26 | Discharge: 2017-01-26 | Disposition: A | Discharge status: Home / Self Care | Payer: Medicare Other | Source: Ambulatory Visit | Attending: Internal Medicine | Admitting: Internal Medicine

## 2017-01-26 DIAGNOSIS — E2839 Other primary ovarian failure: Secondary | ICD-10-CM

## 2017-02-02 DIAGNOSIS — Z1211 Encounter for screening for malignant neoplasm of colon: Secondary | ICD-10-CM | POA: Diagnosis not present

## 2017-02-02 DIAGNOSIS — Z1212 Encounter for screening for malignant neoplasm of rectum: Secondary | ICD-10-CM | POA: Diagnosis not present

## 2017-02-02 LAB — COLOGUARD: COLOGUARD: POSITIVE

## 2017-02-06 LAB — HM DEXA SCAN: HM Dexa Scan: -3.1

## 2017-02-24 ENCOUNTER — Other Ambulatory Visit: Payer: Self-pay | Admitting: Internal Medicine

## 2017-02-24 ENCOUNTER — Encounter: Payer: Self-pay | Admitting: Internal Medicine

## 2017-02-24 ENCOUNTER — Ambulatory Visit (INDEPENDENT_AMBULATORY_CARE_PROVIDER_SITE_OTHER): Payer: Medicare Other | Admitting: Internal Medicine

## 2017-02-24 ENCOUNTER — Telehealth: Payer: Self-pay

## 2017-02-24 VITALS — BP 102/64 | HR 68 | Resp 16 | Ht 62.0 in | Wt 138.8 lb

## 2017-02-24 DIAGNOSIS — E7800 Pure hypercholesterolemia, unspecified: Secondary | ICD-10-CM | POA: Diagnosis not present

## 2017-02-24 DIAGNOSIS — R200 Anesthesia of skin: Secondary | ICD-10-CM

## 2017-02-24 DIAGNOSIS — I422 Other hypertrophic cardiomyopathy: Secondary | ICD-10-CM | POA: Diagnosis not present

## 2017-02-24 DIAGNOSIS — I350 Nonrheumatic aortic (valve) stenosis: Secondary | ICD-10-CM | POA: Diagnosis not present

## 2017-02-24 DIAGNOSIS — I4892 Unspecified atrial flutter: Secondary | ICD-10-CM | POA: Diagnosis not present

## 2017-02-24 DIAGNOSIS — I2510 Atherosclerotic heart disease of native coronary artery without angina pectoris: Secondary | ICD-10-CM

## 2017-02-24 DIAGNOSIS — R195 Other fecal abnormalities: Secondary | ICD-10-CM | POA: Insufficient documentation

## 2017-02-24 MED ORDER — APIXABAN 5 MG PO TABS: 5.0000 mg | ORAL_TABLET | Freq: Two times a day (BID) | ORAL | 6 refills | Status: DC

## 2017-02-24 NOTE — Progress Notes (Signed)
Follow-up Outpatient Visit Date: 02/24/2017  Primary Care Provider: [name], MD [address] [address]  Chief Complaint: Follow-up chest pain  HPI:  Carrie [name] is a 71 y.o. year-old female with history of non-obstructive coronary artery disease, paroxysmal atrial flutter, and aortic stenosis (moderate by echo in 01/2016), who presents for follow-up of chest pain. I last saw her in late July following an overnight hospitalization for chest pain. She described "twinges" of chest pain and occasional flutters in her chest. Given only a single severe episode, we agreed to defer further testing (echo during hospitalization showed stable moderate AS and asymmetric inferior wall hypertrophy with dynamic obstruction).  Today, Carrie [name] reports that she has been feeling well.  Her only complaints are of inability to lose weight and occasional numbness in her hands.  The hand numbness seems to be positional, particularly when bending at the elbow and wrist.  Carrie [name] denies any further episodes of chest pain, shortness of breath, edema, palpitations, and lightheadedness.  She has been trying to do some walking at home and is looking to enroll in the [employer] program.  She has not needed to use any sublingual nitroglycerin.  She is tolerating her current medication regimen well.  --------------------------------------------------------------------------------------------------  Cardiovascular History & Procedures: Cardiovascular Problems:  Paroxysmal atrial flutter  Non-obstructive coronary artery disease  Aortic stenosis  Risk Factors:  Known CAD and age > 70  Cath/PCI:  LHC (07/24/03): LMCA heavily calcified without significant stenosis. LAD with heavy calcification proximally. 50-60% stenosis just distal to D2. LCx calcified proximally without significant stenosis. Dominant RCA with moderate proximal calcification and mild luminal irregularities  throughout. LVEDP 12. LVEF 70%.  CV Surgery:  None  EP Procedures and Devices:  30-day event monitor (12/17/15): Predominantly sinus rhythm with episode of atrial flutter with rate ~100 bpm.  Non-Invasive Evaluation(s):  TTE (11/11/16): Normal LV size with mild focal basal hypertrophy and severe hypertrophy of the inferior wall. LVEF 55-60% with dynamic obstruction. Moderate aortic stenosis (mean gradient 25 mmHg). Mild MR. Moderate left atrial enlargement. Normal RV size and wall thickness. Mild TR. Upper normal PA pressure.  TTE (02/10/16): Normal LV size with mild LVH and focal basal hypertrophy of the septum. LVEF 55-60% with grade 1 diastolic dysfunction and elevated filling pressure. Calcified aortic valve with moderate stenosis (mean gradient 29 mmHg, AVA 1.01 cm^2). MAC with chordal SAM and mild MR. Mild TR. Normal RV size and function.  Recent CV Pertinent Labs: Lab Results  Component Value Date   CHOL 192 11/22/2016   HDL 81 11/22/2016   LDLCALC 89 11/22/2016   LDLDIRECT 133.8 12/19/2012   TRIG 111 11/22/2016   CHOLHDL 2.4 11/22/2016   CHOLHDL 2.3 08/18/2015   K 3.7 11/10/2016   BUN 18 11/10/2016   BUN 19 09/13/2016   CREATININE 0.65 11/10/2016   CREATININE 0.75 03/16/2016    Past medical and surgical history were reviewed and updated in EPIC.  Current Meds  Medication Sig  . CALCIUM PO Take by mouth daily.  . Cholecalciferol (VITAMIN D3) 1000 UNITS CAPS Take 1 capsule by mouth daily.    Marland Kitchen ELIQUIS 5 MG TABS tablet TAKE 1 TABLET (5 MG TOTAL) BY MOUTH 2 (TWO) TIMES DAILY.  . fish oil-omega-3 fatty acids 1000 MG capsule Take 1 g by mouth daily.    . metoprolol succinate (TOPROL-XL) 50 MG 24 hr tablet TAKE 1 TABLET BY MOUTH DAILY. TAKE WITH OR IMMEDIATELY FOLLOWING A MEAL.  Marland Kitchen  nitroGLYCERIN (NITROLINGUAL) 0.4 MG/SPRAY spray Place 1 spray under the tongue every 5 (five) minutes x 3 doses as needed for chest pain.   . rosuvastatin (CRESTOR) 10 MG tablet Take 1 tablet  (10 mg total) by mouth daily.  . Thiamine HCl (VITAMIN B-1) 250 MG tablet Take 500 mg by mouth daily.      Allergies: Codeine; Crestor [rosuvastatin]; and Latex  Social History   Social History  . Marital status: Divorced    Spouse name: N/A  . Number of children: N/A  . Years of education: N/A   Occupational History  . Not on file.   Social History Main Topics  . Smoking status: Former Smoker    Quit date: 04/05/1999  . Smokeless tobacco: Never Used     Comment: Regular Exercise-No  . Alcohol use 4.2 oz/week    7 Glasses of wine per week     Comment: 1 drink per week  . Drug use: No  . Sexual activity: Not Currently   Other Topics Concern  . Not on file   Social History Narrative  . No narrative on file    Family History  Problem Relation Age of Onset  . Heart disease Mother 13  . Congestive Heart Failure Mother   . Dementia Mother   . Hypertension Other   . Valvular heart disease Father        Had valves replaced    Review of Systems: Carrie [name] question some hearing loss.  Otherwise, a 12-system review of systems was performed and was negative except as noted in the HPI.  --------------------------------------------------------------------------------------------------  Physical Exam: BP 102/64   Pulse 68   Resp 16   Ht _0  (1.575 m)   Wt 138 lb 12.8 oz (63 kg)   SpO2 92%   BMI 25.39 kg/m   General: Well-developed, well-nourished woman, seated comfortably in the exam room. HEENT: No conjunctival pallor or scleral icterus. Moist mucous membranes.  OP clear. Neck: Supple without lymphadenopathy, thyromegaly, JVD, or HJR.  Lungs: Normal work of breathing. Clear to auscultation bilaterally without wheezes or crackles. Heart: Regular rate and rhythm with 3/6 crescendo-decrescendo systolic murmur loudest at the right upper sternal border but radiating across the precordium.  No rubs or gallops.  Nondisplaced PMI. Abd: Bowel sounds present. Soft, NT/ND  without hepatosplenomegaly Ext: No lower extremity edema. Radial, PT, and DP pulses are 2+ bilaterally. Skin: Warm and dry without rash.  Lab Results  Component Value Date   WBC 10.3 11/10/2016   HGB 13.9 11/10/2016   HCT 41.2 11/10/2016   MCV 92.4 11/10/2016   PLT 281 11/10/2016    Lab Results  Component Value Date   NA 136 11/10/2016   K 3.7 11/10/2016   CL 104 11/10/2016   CO2 24 11/10/2016   BUN 18 11/10/2016   CREATININE 0.65 11/10/2016   GLUCOSE 121 (H) 11/10/2016   ALT 17 11/22/2016    Lab Results  Component Value Date   CHOL 192 11/22/2016   HDL 81 11/22/2016   LDLCALC 89 11/22/2016   LDLDIRECT 133.8 12/19/2012   TRIG 111 11/22/2016   CHOLHDL 2.4 11/22/2016    --------------------------------------------------------------------------------------------------  ASSESSMENT AND PLAN: Coronary artery disease without angina No further episodes of chest pain since our last visit.  We will defer further ischemia evaluation at this time and focus on medical therapy to prevent progression of previously noted nonobstructive CAD.  Carrie [name] is not currently on aspirin due to long-term anticoagulation with apixaban.  Aortic stenosis Moderate aortic stenosis noted by echo this summer, though some of the transvalvular gradient may have been related to dynamic LVOT obstruction from asymmetric inferior wall thickening.  Carrie [name] has not had any symptoms to suggest worsening stenosis.  We will plan to repeat an echocardiogram next summer.  Hypertrophic cardiomyopathy No symptoms to suggest significant outflow obstruction, though a dynamic intracavitary gradient was noted by echo.  We will continue with aggressive beta-blockade.  Heart rate today is 68 bpm.  Paroxysmal atrial flutter No palpitations or other symptoms to suggest recurrence.  We will continue Carrie [name]'s current dose of metoprolol, as well as apixaban, given CHADSVASC score of 3.  Hyperlipidemia Carrie [name] is  tolerating rosuvastatin 10 mg daily quite well.  Most recent LDL in July was 89.  While this is not ideal, she has been intolerant of higher doses of rosuvastatin in the past.  I will not make any medication changes today.  I encouraged her to continue exercising.  Hand numbness Most likely from positional nerve compression.  Symptoms are not consistent with atypical angina.  I advised Carrie [name] to speak with her PCP if the symptoms worsen.  Follow-up: Return to clinic in 6 months.  Nelva Bush, MD 02/24/2017 10:57 AM

## 2017-02-24 NOTE — Telephone Encounter (Signed)
Pt called back and gave her the results and informed of the referral.

## 2017-02-24 NOTE — Telephone Encounter (Signed)
Contacted [facility] will be faxing results to [phone]

## 2017-02-24 NOTE — Patient Instructions (Addendum)
Medication Instructions:  Your physician recommends that you continue on your current medications as directed. Please refer to the Current Medication list given to you today.    Labwork: None   Testing/Procedures: None   Follow-Up: Your physician wants you to follow-up in: 6 months with Dr End. (May 2019).  You will receive a reminder letter in the mail two months in advance. If you don't receive a letter, please call our office to schedule the follow-up appointment.     If you need a refill on your cardiac medications before your next appointment, please call your pharmacy.

## 2017-02-24 NOTE — Telephone Encounter (Signed)
Cologuard results came back positive.   LVM for pt to call back as soon as possible.  RE: Referral for GI has been entered.

## 2017-02-25 ENCOUNTER — Encounter: Payer: Self-pay | Admitting: Internal Medicine

## 2017-02-25 NOTE — Telephone Encounter (Signed)
Pt called in and has questions about this test, she wants to know why it is positive ?

## 2017-02-28 ENCOUNTER — Encounter: Payer: Self-pay | Admitting: Gastroenterology

## 2017-02-28 NOTE — Telephone Encounter (Signed)
Pt contacted and she stated that she has an appt with GI on Wednesday.  Pt informed what could make the results positive and stated understanding.

## 2017-03-02 ENCOUNTER — Ambulatory Visit: Payer: Medicare Other | Admitting: Gastroenterology

## 2017-03-02 ENCOUNTER — Telehealth: Payer: Self-pay | Admitting: *Deleted

## 2017-03-02 ENCOUNTER — Encounter: Payer: Self-pay | Admitting: Gastroenterology

## 2017-03-02 VITALS — BP 126/64 | HR 60 | Ht 62.0 in | Wt 137.0 lb

## 2017-03-02 DIAGNOSIS — R140 Abdominal distension (gaseous): Secondary | ICD-10-CM

## 2017-03-02 DIAGNOSIS — R195 Other fecal abnormalities: Secondary | ICD-10-CM

## 2017-03-02 MED ORDER — NA SULFATE-K SULFATE-MG SULF 17.5-3.13-1.6 GM/177ML PO SOLN: ORAL | 0 refills | Status: DC

## 2017-03-02 NOTE — Telephone Encounter (Signed)
  03/02/2017   RE: Carrie [name] DOB: 17-May-1945 MRN: [id number]   Dear  Dr End    We have scheduled the above patient for an endoscopic procedure. Our records show that she is on anticoagulation therapy.   Please advise as to how long the patient may come off her therapy of  Eliquis prior to the procedure, which is scheduled for 04/06/2017.  Please fax back/ or route the completed form to [facility] at [phone].   Sincerely,    Genella Mech ,CMA AAMA

## 2017-03-02 NOTE — Telephone Encounter (Signed)
Apixaban (Eliquis) should be held for 48 hours prior to endoscopy and restarted when it is felt safe to do so from a periprocedural standpoint.  Nelva Bush, MD [facility] Pager: [phone]

## 2017-03-02 NOTE — Progress Notes (Signed)
Carrie [name]    [id number]    1945-12-29  Primary Care Physician:Jones, Arvid Right, MD  Referring Physician: [name], MD [address], [place] [address]  Chief complaint: Positive Colo guard HPI:  71 year old female here to discuss positive cologaurd results.  She never had colonoscopy or any other colorectal cancer screening previously.  On review of system complains of feeling of fullness associated with bloating in the lower abdomen otherwise no specific GI complaints.Denies any nausea, vomiting, abdominal pain, melena or bright red blood per rectum No family history of colon cancer or IBD.   Outpatient Encounter Medications as of 03/02/2017  Medication Sig  . apixaban (ELIQUIS) 5 MG TABS tablet Take 1 tablet (5 mg total) by mouth 2 (two) times daily.  Marland Kitchen CALCIUM PO Take by mouth daily.  . Cholecalciferol (VITAMIN D3) 1000 UNITS CAPS Take 1 capsule by mouth daily.    . fish oil-omega-3 fatty acids 1000 MG capsule Take 1 g by mouth daily.    . metoprolol succinate (TOPROL-XL) 50 MG 24 hr tablet TAKE 1 TABLET BY MOUTH DAILY. TAKE WITH OR IMMEDIATELY FOLLOWING A MEAL.  . nitroGLYCERIN (NITROLINGUAL) 0.4 MG/SPRAY spray Place 1 spray under the tongue every 5 (five) minutes x 3 doses as needed for chest pain.   . Thiamine HCl (VITAMIN B-1) 250 MG tablet Take 500 mg by mouth daily.    . rosuvastatin (CRESTOR) 10 MG tablet Take 1 tablet (10 mg total) by mouth daily.   No facility-administered encounter medications on file as of 03/02/2017.     Allergies as of 03/02/2017 - Review Complete 03/02/2017  Allergen Reaction Noted  . Codeine Nausea And Vomiting 03/11/2010  . Crestor [rosuvastatin] Other (See Comments) 06/23/2012  . Latex Rash     Past Medical History:  Diagnosis Date  . Aortic stenosis   . Coronary artery disease    a. Prior LHC (07/24/03): LMCA heavily calcified without significant stenosis, LAD with heavy calcification  proximally. 50-60% stenosis just distal to D2, LCx calcified proximally without significant stenosis, Dominant RCA with moderate proximal calcification and mild luminal irregularities throughout, LVEDP 12, LVEF 70%  . Diastolic dysfunction without heart failure   . Former tobacco use   . Former tobacco use   . HOCM (hypertrophic obstructive cardiomyopathy) ([place])   . Mild AI (aortic insufficiency)   . Osteoporosis, unspecified   . Other and unspecified hyperlipidemia   . Paroxysmal atrial flutter ([place])   . Wears hearing aid    left    Past Surgical History:  Procedure Laterality Date  . ATRIAL ABLATION SURGERY  2005  . BREAST SURGERY  1982   Benign tumor removed RT  . CARDIAC CATHETERIZATION  2005  . GANGLION CYST EXCISION     left wrist  . HERNIA REPAIR  1990   rt ing  . TONSILLECTOMY    . TUBAL LIGATION      Family History  Problem Relation Age of Onset  . Heart disease Mother 79  . Congestive Heart Failure Mother   . Dementia Mother   . Hypertension Other   . Valvular heart disease Father        Had valves replaced    Social History   Socioeconomic History  . Marital status: Divorced    Spouse name: Not on file  . Number of children: Not on file  . Years of education: Not on file  . Highest  education level: Not on file  Social Needs  . Financial resource strain: Not on file  . Food insecurity - worry: Not on file  . Food insecurity - inability: Not on file  . Transportation needs - medical: Not on file  . Transportation needs - non-medical: Not on file  Occupational History  . Not on file  Tobacco Use  . Smoking status: Former Smoker    Last attempt to quit: 04/05/1999    Years since quitting: 17.9  . Smokeless tobacco: Never Used  . Tobacco comment: Regular Exercise-No  Substance and Sexual Activity  . Alcohol use: Yes    Alcohol/week: 4.2 oz    Types: 7 Glasses of wine per week    Comment: 1 drink per week  . Drug use: No  . Sexual activity: Not  Currently  Other Topics Concern  . Not on file  Social History Narrative  . Not on file      Review of systems: Review of Systems  Constitutional: Negative for fever and chills.  HENT: Negative.   Eyes: Negative for blurred vision.  Respiratory: Negative for cough, shortness of breath and wheezing.   Cardiovascular: Negative for chest pain and palpitations.  Gastrointestinal: as per HPI Genitourinary: Negative for dysuria, urgency, frequency and hematuria.  Musculoskeletal: Negative for myalgias, back pain and joint pain.  Skin: Negative for itching and rash.  Neurological: Negative for dizziness, tremors, focal weakness, seizures and loss of consciousness.  Endo/Heme/Allergies: Positive for seasonal allergies.  Psychiatric/Behavioral: Negative for depression, suicidal ideas and hallucinations.  All other systems reviewed and are negative.   Physical Exam: Vitals:   03/02/17 1412  BP: 126/64  Pulse: 60   Body mass index is 25.06 kg/m. Gen:      No acute distress HEENT:  EOMI, sclera anicteric Neck:     No masses; no thyromegaly Lungs:    Clear to auscultation bilaterally; normal respiratory effort CV:         irregular rate and rhythm; + murmurs Abd:      + bowel sounds; soft, non-tender; no palpable masses, no distension Ext:    No edema; adequate peripheral perfusion Skin:      Warm and dry; no rash Neuro: alert and oriented x 3 Psych: normal mood and affect  Data Reviewed:  Reviewed labs, radiology imaging, old records and pertinent past GI work up   Assessment and Plan/Recommendations: 70 year old female with history of CAD, moderate aortic stenosis, cardiomyopathy, EF 55-60% on most recent echo July 2018 here to discuss positive cologaurd Will schedule for colonoscopy to further evaluate and exclude colorectal cancer or advanced precancerous lesions The risks and benefits as well as alternatives of endoscopic procedure(s) have been discussed and reviewed. All  questions answered. The patient agrees to proceed. Will discuss with cardiology if okay to hold Eliquis 2 days prior to the procedure  Return as needed  K. Denzil Magnuson , MD [phone] Mon-Fri 8a-5p [phone] after 5p, weekends, holidays  CC: [name], MD

## 2017-03-02 NOTE — Patient Instructions (Signed)
You have been scheduled for a colonoscopy. Please follow written instructions given to you at your visit today.  Please pick up your prep supplies at the pharmacy within the next 1-3 days. If you use inhalers (even only as needed), please bring them with you on the day of your procedure. Your physician has requested that you go to www.startemmi.com and enter the access code given to you at your visit today. This web site gives a general overview about your procedure. However, you should still follow specific instructions given to you by our office regarding your preparation for the procedure.  You will be contacted by our office prior to your procedure for directions on holding your Eliquis.  If you do not hear from our office 1 week prior to your scheduled procedure, please call [phone] to discuss.

## 2017-03-03 NOTE — Telephone Encounter (Signed)
LM for pt to call the office.

## 2017-03-04 NOTE — Addendum Note (Signed)
Addended by: Mauri Pole on: 03/04/2017 11:07 PM   Modules accepted: Level of Service

## 2017-03-07 DIAGNOSIS — Z1231 Encounter for screening mammogram for malignant neoplasm of breast: Secondary | ICD-10-CM | POA: Diagnosis not present

## 2017-03-07 LAB — HM MAMMOGRAPHY

## 2017-03-18 ENCOUNTER — Encounter: Payer: Self-pay | Admitting: Internal Medicine

## 2017-04-04 ENCOUNTER — Encounter: Payer: Self-pay | Admitting: Gastroenterology

## 2017-04-06 ENCOUNTER — Encounter: Payer: Medicare Other | Admitting: Gastroenterology

## 2017-04-12 ENCOUNTER — Telehealth: Payer: Self-pay | Admitting: Gastroenterology

## 2017-04-12 NOTE — Telephone Encounter (Signed)
Patient calls back. Acknowledges the information was received.

## 2017-04-12 NOTE — Telephone Encounter (Signed)
Called twice. Voicemail both times. Left message to hold the Eliquis 48 hours before her procedure. Specifically no Eliquis after today. She will be instructed after the procedure on when to restart it.

## 2017-04-14 LAB — HM COLONOSCOPY

## 2017-04-15 ENCOUNTER — Encounter: Payer: Self-pay | Admitting: Gastroenterology

## 2017-04-15 ENCOUNTER — Other Ambulatory Visit: Payer: Self-pay

## 2017-04-15 ENCOUNTER — Ambulatory Visit (AMBULATORY_SURGERY_CENTER): Payer: Medicare Other | Admitting: Gastroenterology

## 2017-04-15 VITALS — BP 148/66 | HR 140 | Temp 97.5°F | Resp 15 | Ht 62.0 in | Wt 137.0 lb

## 2017-04-15 DIAGNOSIS — D122 Benign neoplasm of ascending colon: Secondary | ICD-10-CM | POA: Diagnosis not present

## 2017-04-15 DIAGNOSIS — R195 Other fecal abnormalities: Secondary | ICD-10-CM | POA: Diagnosis present

## 2017-04-15 DIAGNOSIS — D123 Benign neoplasm of transverse colon: Secondary | ICD-10-CM | POA: Diagnosis not present

## 2017-04-15 DIAGNOSIS — D120 Benign neoplasm of cecum: Secondary | ICD-10-CM | POA: Diagnosis not present

## 2017-04-15 DIAGNOSIS — D124 Benign neoplasm of descending colon: Secondary | ICD-10-CM

## 2017-04-15 MED ORDER — SODIUM CHLORIDE 0.9 % IV SOLN: 500.0000 mL | Freq: Once | INTRAVENOUS | Status: DC

## 2017-04-15 NOTE — Progress Notes (Signed)
Report to PACU, RN, vss, BBS= Clear.  

## 2017-04-15 NOTE — Op Note (Addendum)
[facility] Patient Name: Carrie [name] Procedure Date: 04/15/2017 11:10 AM MRN: [id number] Endoscopist: Mauri Pole , MD Age: 71 Referring MD:  Date of Birth: April 01, 1946 Gender: Female Account #: [id number] Procedure:                Colonoscopy Indications:              Positive Cologuard test Medicines:                Monitored Anesthesia Care Procedure:                Pre-Anesthesia Assessment:                           - Prior to the procedure, a History and Physical                            was performed, and patient medications and                            allergies were reviewed. The patient's tolerance of                            previous anesthesia was also reviewed. The risks                            and benefits of the procedure and the sedation                            options and risks were discussed with the patient.                            All questions were answered, and informed consent                            was obtained. Prior Anticoagulants: The patient                            last took Eliquis (apixaban) 2 days prior to the                            procedure. ASA Grade Assessment: III - A patient                            with severe systemic disease. After reviewing the                            risks and benefits, the patient was deemed in                            satisfactory condition to undergo the procedure.                           After obtaining informed consent, the colonoscope  was passed under direct vision. Throughout the                            procedure, the patient's blood pressure, pulse, and                            oxygen saturations were monitored continuously. The                            Model PCF-[id number]) scope was introduced                            through the anus and advanced to the the cecum,                            identified by appendiceal  orifice and ileocecal                            valve. The colonoscopy was performed without                            difficulty. The patient tolerated the procedure                            well. The quality of the bowel preparation was                            excellent. The ileocecal valve, appendiceal                            orifice, and rectum were photographed. Scope In: 11:23:13 AM Scope Out: 11:47:31 AM Scope Withdrawal Time: 0 hours 20 minutes 32 seconds  Total Procedure Duration: 0 hours 24 minutes 18 seconds  Findings:                 The perianal and digital rectal examinations were                            normal.                           A 2 mm polyp was found in the ascending colon. The                            polyp was sessile. The polyp was removed with a                            cold biopsy forceps. Resection and retrieval were                            complete.                           A 11 mm polyp was found in the ileocecal valve. The  polyp was granular lateral spreading. The polyp was                            removed with a cold snare. Resection and retrieval                            were complete.                           Five sessile polyps were found in the descending                            colon, transverse colon and ascending colon. The                            polyps were 5 to 9 mm in size. These polyps were                            removed with a cold snare. Resection and retrieval                            were complete.                           Multiple small and large-mouthed diverticula were                            found in the sigmoid colon, descending colon,                            transverse colon and ascending colon.                           Non-bleeding internal hemorrhoids were found during                            retroflexion. The hemorrhoids were small. Complications:             No immediate complications. Estimated Blood Loss:     Estimated blood loss was minimal. Impression:               - One 2 mm polyp in the ascending colon, removed                            with a cold biopsy forceps. Resected and retrieved.                           - One 11 mm polyp at the ileocecal valve, removed                            with a cold snare. Resected and retrieved.                           - Five 5 to 9 mm polyps in the descending colon, in  the transverse colon and in the ascending colon,                            removed with a cold snare. Resected and retrieved.                           - Moderate diverticulosis in the sigmoid colon, in                            the descending colon, in the transverse colon and                            in the ascending colon.                           - Non-bleeding internal hemorrhoids. Recommendation:           - Patient has a contact number available for                            emergencies. The signs and symptoms of potential                            delayed complications were discussed with the                            patient. Return to normal activities tomorrow.                            Written discharge instructions were provided to the                            patient.                           - Resume previous diet.                           - Continue present medications.                           - Await pathology results.                           - Repeat colonoscopy in 3 years for surveillance                            based on pathology results.                           - Resume Eliquis (apixaban) at prior dose in 2                            days. Refer to managing physician for further  adjustment of therapy. Mauri Pole, MD 04/15/2017 12:00:27 PM This report has been signed electronically.

## 2017-04-15 NOTE — Patient Instructions (Addendum)
**  Handouts given on Polyps, diverticulosis and hemorrhoids**  **Start Eliquis back on Sunday**   YOU HAD AN ENDOSCOPIC PROCEDURE TODAY: Refer to the procedure report and other information in the discharge instructions given to you for any specific questions about what was found during the examination. If this information does not answer your questions, please call [facility] office at [phone] to clarify.   YOU SHOULD EXPECT: Some feelings of bloating in the abdomen. Passage of more gas than usual. Walking can help get rid of the air that was put into your GI tract during the procedure and reduce the bloating. If you had a lower endoscopy (such as a colonoscopy or flexible sigmoidoscopy) you may notice spotting of blood in your stool or on the toilet paper. Some abdominal soreness may be present for a day or two, also.  DIET: Your first meal following the procedure should be a light meal and then it is ok to progress to your normal diet. A half-sandwich or bowl of soup is an example of a good first meal. Heavy or fried foods are harder to digest and may make you feel nauseous or bloated. Drink plenty of fluids but you should avoid alcoholic beverages for 24 hours. If you had a esophageal dilation, please see attached instructions for diet.    ACTIVITY: Your care partner should take you home directly after the procedure. You should plan to take it easy, moving slowly for the rest of the day. You can resume normal activity the day after the procedure however YOU SHOULD NOT DRIVE, use power tools, machinery or perform tasks that involve climbing or major physical exertion for 24 hours (because of the sedation medicines used during the test).   SYMPTOMS TO REPORT IMMEDIATELY: A gastroenterologist can be reached at any hour. Please call [phone]  for any of the following symptoms:  Following lower endoscopy (colonoscopy, flexible sigmoidoscopy) Excessive amounts of blood in the stool  Significant  tenderness, worsening of abdominal pains  Swelling of the abdomen that is new, acute  Fever of 100 or higher    FOLLOW UP:  If any biopsies were taken you will be contacted by phone or by letter within the next 1-3 weeks. Call [phone]  if you have not heard about the biopsies in 3 weeks.  Please also call with any specific questions about appointments or follow up tests.

## 2017-04-15 NOTE — Progress Notes (Signed)
Called to room to assist during endoscopic procedure.  Patient ID and intended procedure confirmed with present staff. Received instructions for my participation in the procedure from the performing physician.  

## 2017-04-18 ENCOUNTER — Telehealth: Payer: Self-pay | Admitting: *Deleted

## 2017-04-18 NOTE — Telephone Encounter (Signed)
  Follow up Call-  Call back number 04/15/2017  Post procedure Call Back phone  # [phone]  Permission to leave phone message Yes  Some recent data might be hidden     Patient questions:  Do you have a fever, pain , or abdominal swelling? No. Pain Score  0 *  Have you tolerated food without any problems? Yes.    Have you been able to return to your normal activities? Yes.    Do you have any questions about your discharge instructions: Diet   No. Medications  No. Follow up visit  No.  Do you have questions or concerns about your Care? No.  Actions: * If pain score is 4 or above: No action needed, pain <4.

## 2017-04-21 ENCOUNTER — Ambulatory Visit: Payer: Medicare Other | Admitting: Family Medicine

## 2017-04-21 ENCOUNTER — Encounter: Payer: Self-pay | Admitting: Gastroenterology

## 2017-04-21 DIAGNOSIS — J181 Lobar pneumonia, unspecified organism: Secondary | ICD-10-CM | POA: Diagnosis not present

## 2017-04-21 DIAGNOSIS — R079 Chest pain, unspecified: Secondary | ICD-10-CM | POA: Diagnosis not present

## 2017-04-21 DIAGNOSIS — Z7901 Long term (current) use of anticoagulants: Secondary | ICD-10-CM | POA: Diagnosis not present

## 2017-04-21 DIAGNOSIS — J690 Pneumonitis due to inhalation of food and vomit: Secondary | ICD-10-CM | POA: Diagnosis not present

## 2017-04-21 DIAGNOSIS — R109 Unspecified abdominal pain: Secondary | ICD-10-CM | POA: Diagnosis not present

## 2017-04-21 DIAGNOSIS — I5033 Acute on chronic diastolic (congestive) heart failure: Secondary | ICD-10-CM | POA: Diagnosis not present

## 2017-04-21 DIAGNOSIS — E871 Hypo-osmolality and hyponatremia: Secondary | ICD-10-CM | POA: Diagnosis not present

## 2017-04-21 DIAGNOSIS — K5730 Diverticulosis of large intestine without perforation or abscess without bleeding: Secondary | ICD-10-CM | POA: Diagnosis not present

## 2017-04-21 DIAGNOSIS — E785 Hyperlipidemia, unspecified: Secondary | ICD-10-CM | POA: Diagnosis not present

## 2017-04-21 DIAGNOSIS — J9601 Acute respiratory failure with hypoxia: Secondary | ICD-10-CM | POA: Diagnosis not present

## 2017-04-21 DIAGNOSIS — I2510 Atherosclerotic heart disease of native coronary artery without angina pectoris: Secondary | ICD-10-CM | POA: Diagnosis not present

## 2017-04-21 DIAGNOSIS — M4856XA Collapsed vertebra, not elsewhere classified, lumbar region, initial encounter for fracture: Secondary | ICD-10-CM | POA: Diagnosis not present

## 2017-04-21 DIAGNOSIS — J9811 Atelectasis: Secondary | ICD-10-CM | POA: Diagnosis not present

## 2017-04-21 DIAGNOSIS — R1012 Left upper quadrant pain: Secondary | ICD-10-CM | POA: Diagnosis not present

## 2017-04-21 DIAGNOSIS — R0689 Other abnormalities of breathing: Secondary | ICD-10-CM | POA: Diagnosis not present

## 2017-04-21 DIAGNOSIS — I110 Hypertensive heart disease with heart failure: Secondary | ICD-10-CM | POA: Diagnosis not present

## 2017-04-21 DIAGNOSIS — I480 Paroxysmal atrial fibrillation: Secondary | ICD-10-CM | POA: Diagnosis not present

## 2017-04-21 DIAGNOSIS — R1032 Left lower quadrant pain: Secondary | ICD-10-CM | POA: Diagnosis not present

## 2017-04-21 DIAGNOSIS — Z79899 Other long term (current) drug therapy: Secondary | ICD-10-CM | POA: Diagnosis not present

## 2017-04-22 ENCOUNTER — Telehealth: Payer: Self-pay | Admitting: Gastroenterology

## 2017-04-22 DIAGNOSIS — M4856XA Collapsed vertebra, not elsewhere classified, lumbar region, initial encounter for fracture: Secondary | ICD-10-CM | POA: Diagnosis not present

## 2017-04-22 DIAGNOSIS — I480 Paroxysmal atrial fibrillation: Secondary | ICD-10-CM | POA: Diagnosis not present

## 2017-04-22 DIAGNOSIS — J690 Pneumonitis due to inhalation of food and vomit: Secondary | ICD-10-CM | POA: Diagnosis not present

## 2017-04-22 DIAGNOSIS — E785 Hyperlipidemia, unspecified: Secondary | ICD-10-CM | POA: Diagnosis not present

## 2017-04-22 DIAGNOSIS — J9601 Acute respiratory failure with hypoxia: Secondary | ICD-10-CM | POA: Diagnosis not present

## 2017-04-22 DIAGNOSIS — I5033 Acute on chronic diastolic (congestive) heart failure: Secondary | ICD-10-CM | POA: Diagnosis not present

## 2017-04-25 NOTE — Telephone Encounter (Signed)
Patient is scheduled for 05/05/16. Tried to call to get an update. No answer.

## 2017-04-25 NOTE — Telephone Encounter (Signed)
Patient returning phone call best call back # [phone].

## 2017-04-25 NOTE — Telephone Encounter (Signed)
This number is disconnected 

## 2017-04-27 ENCOUNTER — Emergency Department (HOSPITAL_BASED_OUTPATIENT_CLINIC_OR_DEPARTMENT_OTHER): Payer: Medicare HMO

## 2017-04-27 ENCOUNTER — Emergency Department (HOSPITAL_BASED_OUTPATIENT_CLINIC_OR_DEPARTMENT_OTHER)
Admission: EM | Admit: 2017-04-27 | Discharge: 2017-04-28 | Disposition: A | Discharge status: Home / Self Care | Payer: Medicare HMO | Attending: Emergency Medicine | Admitting: Emergency Medicine

## 2017-04-27 ENCOUNTER — Encounter (HOSPITAL_BASED_OUTPATIENT_CLINIC_OR_DEPARTMENT_OTHER): Payer: Self-pay

## 2017-04-27 ENCOUNTER — Other Ambulatory Visit: Payer: Self-pay

## 2017-04-27 DIAGNOSIS — Z7901 Long term (current) use of anticoagulants: Secondary | ICD-10-CM | POA: Diagnosis not present

## 2017-04-27 DIAGNOSIS — Z87891 Personal history of nicotine dependence: Secondary | ICD-10-CM | POA: Diagnosis not present

## 2017-04-27 DIAGNOSIS — Z79899 Other long term (current) drug therapy: Secondary | ICD-10-CM | POA: Insufficient documentation

## 2017-04-27 DIAGNOSIS — J181 Lobar pneumonia, unspecified organism: Secondary | ICD-10-CM | POA: Diagnosis not present

## 2017-04-27 DIAGNOSIS — J189 Pneumonia, unspecified organism: Secondary | ICD-10-CM

## 2017-04-27 DIAGNOSIS — Z91040 Latex allergy status: Secondary | ICD-10-CM | POA: Insufficient documentation

## 2017-04-27 DIAGNOSIS — I2510 Atherosclerotic heart disease of native coronary artery without angina pectoris: Secondary | ICD-10-CM | POA: Insufficient documentation

## 2017-04-27 DIAGNOSIS — R079 Chest pain, unspecified: Secondary | ICD-10-CM | POA: Diagnosis not present

## 2017-04-27 DIAGNOSIS — R0789 Other chest pain: Secondary | ICD-10-CM | POA: Diagnosis not present

## 2017-04-27 HISTORY — DX: Pneumonia, unspecified organism: J18.9

## 2017-04-27 LAB — TROPONIN I

## 2017-04-27 LAB — BASIC METABOLIC PANEL
Anion gap: 8 (ref 5–15)
BUN: 10 mg/dL (ref 6–20)
CALCIUM: 9 mg/dL (ref 8.9–10.3)
CO2: 24 mmol/L (ref 22–32)
CREATININE: 0.55 mg/dL (ref 0.44–1.00)
Chloride: 103 mmol/L (ref 101–111)
GFR calc Af Amer: >60 mL/min (ref >60)
GLUCOSE: 99 mg/dL (ref 65–99)
Potassium: 3.6 mmol/L (ref 3.5–5.1)
Sodium: 135 mmol/L (ref 135–145)

## 2017-04-27 LAB — CBC
HCT: 38.8 % (ref 36.0–46.0)
Hemoglobin: 13.1 g/dL (ref 12.0–15.0)
MCH: 30.8 pg (ref 26.0–34.0)
MCHC: 33.8 g/dL (ref 30.0–36.0)
MCV: 91.1 fL (ref 78.0–100.0)
Platelets: 367 10*3/uL (ref 150–400)
RBC: 4.26 MIL/uL (ref 3.87–5.11)
RDW: 12.2 % (ref 11.5–15.5)
WBC: 10.6 10*3/uL — ABNORMAL HIGH (ref 4.0–10.5)

## 2017-04-27 MED ORDER — IOPAMIDOL (ISOVUE-370) INJECTION 76%
100.0000 mL | Freq: Once | INTRAVENOUS | Status: AC | PRN
  Administered 2017-04-27: 100 mL via INTRAVENOUS

## 2017-04-27 NOTE — ED Notes (Signed)
Pt has had persistent CP that started this AM. Area is localized to below L breast. Pain is worse with inspiration and is tender to palpation. Pt denies ShOB, nausea, or diaphoresis.

## 2017-04-27 NOTE — ED Triage Notes (Signed)
C/o pain to left rib area x today-states she was in [facility] for PNE last week-NAD-steady gait

## 2017-04-27 NOTE — ED Provider Notes (Signed)
[facility] Provider Note   CSN: [id number] Arrival date & time: 04/27/17  1929     History   Chief Complaint Chief Complaint  Patient presents with  . Chest Pain    HPI Carrie [name] is a 72 y.o. female.  72yo F w/ PMH including CAD, HOCM, A flutter on Eliquis who p/w chest pain. Today she began having sharp intermittent L-sided chest pain under her left breast that is worse with deep breathing and coughing. She has never had this before. No SOB, DOE, nausea/vomiting, or diaphoresis. She was hospitalized last week for pneumonia, discharged with augmentin, mucinex, and probiotics which she has been taking. Cough is much improved. No vomiting, diarrhea, or fevers. No h/o blood clots or cancer. She is compliant with medications. She did have to go off Eliquis from 12/19-12/21 for colonoscopy.    The history is provided by the patient.  Chest Pain      Past Medical History:  Diagnosis Date  . Aortic stenosis   . Coronary artery disease    a. Prior LHC (07/24/03): LMCA heavily calcified without significant stenosis, LAD with heavy calcification proximally. 50-60% stenosis just distal to D2, LCx calcified proximally without significant stenosis, Dominant RCA with moderate proximal calcification and mild luminal irregularities throughout, LVEDP 12, LVEF 70%  . Diastolic dysfunction without heart failure   . Former tobacco use   . Former tobacco use   . HOCM (hypertrophic obstructive cardiomyopathy) ([place])   . Mild AI (aortic insufficiency)   . Osteoporosis, unspecified   . Other and unspecified hyperlipidemia   . Paroxysmal atrial flutter ([place])   . Pneumonia   . Wears hearing aid    left    Patient Active Problem List   Diagnosis Date Noted  . Positive colorectal cancer screening using Cologuard test 02/24/2017  . Coronary artery disease involving native coronary artery of native heart without angina pectoris 02/24/2017  . Pure  hypercholesterolemia 02/24/2017  . Hypertrophic cardiomyopathy ([place]) 11/22/2016  . Elevated LFTs 08/16/2016  . Atrial flutter ([place]) 07/08/2016  . Varicose veins of lower extremities with other complications 10/24/1599  . Allergic rhinitis 12/28/2012  . OAB (overactive bladder) 06/22/2012  . Routine general medical examination at a health care facility 06/22/2012  . Moderate aortic stenosis 09/05/2010  . Coronary artery disease due to lipid rich plaque 06/26/2009  . SUPRAVENTRICULAR TACHYCARDIA 06/23/2009  . Hyperlipidemia 03/06/2009  . Osteoporosis 03/06/2009    Past Surgical History:  Procedure Laterality Date  . ATRIAL ABLATION SURGERY  2005  . BREAST SURGERY  1982   Benign tumor removed RT  . BUNIONECTOMY  04/07/2012   Procedure: Lillard Anes;  Surgeon: Alta Corning, MD;  Location: [facility];  Service: Orthopedics;  Laterality: Left;  CHEVRON OSTEOTOMY LEFT FOOT   . CARDIAC CATHETERIZATION  2005  . GANGLION CYST EXCISION     left wrist  . HERNIA REPAIR  1990   rt ing  . TONSILLECTOMY    . TUBAL LIGATION      OB History    No data available       Home Medications    Prior to Admission medications   Medication Sig Start Date End Date Taking? Authorizing Provider  GuaiFENesin (MUCINEX PO) Take by mouth.   Yes [provider]  apixaban (ELIQUIS) 5 MG TABS tablet Take 1 tablet (5 mg total) by mouth 2 (two) times daily. 02/24/17   End, Harrell Gave, MD  CALCIUM PO Take by mouth daily.  [provider]  Cholecalciferol (VITAMIN D3) 1000 UNITS CAPS Take 1 capsule by mouth daily.      [provider]  doxycycline (VIBRAMYCIN) 100 MG capsule Take 1 capsule (100 mg total) by mouth 2 (two) times daily. 04/28/17   Partick Musselman, Wenda Overland, MD  fish oil-omega-3 fatty acids 1000 MG capsule Take 1 g by mouth daily.      [provider]  metoprolol succinate (TOPROL-XL) 50 MG 24 hr tablet TAKE 1 TABLET BY MOUTH DAILY. TAKE WITH OR  IMMEDIATELY FOLLOWING A MEAL. 01/06/17   End, Harrell Gave, MD  nitroGLYCERIN (NITROLINGUAL) 0.4 MG/SPRAY spray Place 1 spray under the tongue every 5 (five) minutes x 3 doses as needed for chest pain.     [provider]  nitroGLYCERIN (NITROLINGUAL) 0.4 MG/SPRAY spray Place 1 spray under the tongue 3 times/day as needed-between meals & bedtime.    [provider]  rosuvastatin (CRESTOR) 10 MG tablet Take 1 tablet (10 mg total) by mouth daily. 09/16/16 02/24/17  End, Harrell Gave, MD  Thiamine HCl (VITAMIN B-1) 250 MG tablet Take 500 mg by mouth daily.      [provider]    Family History Family History  Problem Relation Age of Onset  . Heart disease Mother 41  . Congestive Heart Failure Mother   . Dementia Mother   . Hypertension Other   . Valvular heart disease Father        Had valves replaced  . Colon cancer Neg Hx   . Rectal cancer Neg Hx     Social History Social History   Tobacco Use  . Smoking status: Former Smoker    Last attempt to quit: 04/05/1999    Years since quitting: 18.0  . Smokeless tobacco: Never Used  . Tobacco comment: Regular Exercise-No  Substance Use Topics  . Alcohol use: Yes    Alcohol/week: 4.2 oz    Types: 7 Glasses of wine per week    Comment: 1 drink per week  . Drug use: No     Allergies   Codeine; Crestor [rosuvastatin]; and Latex   Review of Systems Review of Systems  Cardiovascular: Positive for chest pain.   All other systems reviewed and are negative except that which was mentioned in HPI   Physical Exam Updated Vital Signs BP 104/87   Pulse 87   Temp 97.8 F (36.6 C) (Oral)   Resp (!) 22   Ht 5\' 4"  (1.626 m)   Wt 60.8 kg (134 lb)   SpO2 94%   BMI 23.00 kg/m   Physical Exam  Constitutional: She is oriented to person, place, and time. She appears well-developed and well-nourished. No distress.  HENT:  Head: Normocephalic and atraumatic.  Moist mucous membranes  Eyes: Conjunctivae are normal.  Pupils are equal, round, and reactive to light.  Neck: Neck supple.  Cardiovascular: Normal rate and regular rhythm.  Murmur heard.  Systolic murmur is present with a grade of 3/6. Pulmonary/Chest: Effort normal and breath sounds normal.  Abdominal: Soft. Bowel sounds are normal. She exhibits no distension. There is no tenderness.  Musculoskeletal: She exhibits no edema.  Neurological: She is alert and oriented to person, place, and time.  Fluent speech  Skin: Skin is warm and dry.  Psychiatric: She has a normal mood and affect. Judgment normal.  Nursing note and vitals reviewed.    ED Treatments / Results  Labs (all labs ordered are listed, but only abnormal results are displayed) Labs Reviewed  CBC -  Abnormal; Notable for the following components:      Result Value   WBC 10.6 (*)    All other components within normal limits  BASIC METABOLIC PANEL  TROPONIN I  TROPONIN I    EKG  EKG Interpretation  Date/Time:  Wednesday April 27 2017 19:39:35 EST Ventricular Rate:  84 PR Interval:  126 QRS Duration: 110 QT Interval:  374 QTC Calculation: 441 R Axis:   38 Text Interpretation:  Normal sinus rhythm Possible Left atrial enlargement Cannot rule out Anterior infarct , age undetermined ST & T wave abnormality, consider lateral ischemia Abnormal ECG Interpretation limited secondary to artifact looks similar to previous Confirmed by Theotis Burrow [phone]) on 04/27/2017 8:11:37 PM       Radiology Dg Chest 2 View  Result Date: 04/27/2017 CLINICAL DATA:  Left-sided chest pain under the left lower rib cage today. Patient was at [facility] for pneumonia last week. Former smoker. EXAM: CHEST  2 VIEW COMPARISON:  11/10/2016 FINDINGS: Shallow inspiration with atelectasis in the lung bases. Blunting of the left costophrenic angle with more pronounced infiltration, likely representing residual pneumonia and effusion. Right lung is clear. Heart size and pulmonary vascularity are  normal for technique. Calcification of the aorta. No pneumothorax. Midthoracic vertebral compression deformities without change since prior study. IMPRESSION: Shallow inspiration. Probable residual infiltration and effusion in the left base. Aortic atherosclerosis. Electronically Signed   By: Lucienne Capers M.D.   On: 04/27/2017 20:32   Ct Angio Chest Pe W/cm &/or Wo Cm  Result Date: 04/27/2017 CLINICAL DATA:  Persistent chest pain starting this morning. Pain below the left breast, worse with inspiration. History of coronary artery disease. Former tobacco user. EXAM: CT ANGIOGRAPHY CHEST WITH CONTRAST TECHNIQUE: Multidetector CT imaging of the chest was performed using the standard protocol during bolus administration of intravenous contrast. Multiplanar CT image reconstructions and MIPs were obtained to evaluate the vascular anatomy. CONTRAST:  71 mL ISOVUE-370 IOPAMIDOL (ISOVUE-370) INJECTION 76% COMPARISON:  Chest radiograph 04/27/2017 FINDINGS: Cardiovascular: Good opacification of the central and segmental pulmonary arteries. No focal filling defects demonstrated. No evidence of significant pulmonary embolus. Normal heart size. No pericardial effusion. Coronary artery calcifications. Normal caliber thoracic aorta with scattered calcification. No aortic dissection. Great vessel origins are patent. Suggestion of prominent calcific stenosis of the left subclavian artery origin. Mediastinum/Nodes: No enlarged mediastinal, hilar, or axillary lymph nodes. Thyroid gland, trachea, and esophagus demonstrate no significant findings. Lungs/Pleura: Atelectasis or consolidation in the left lung base with small left pleural effusion. This may represent pneumonia. Right lung is clear. No pneumothorax. Upper Abdomen: No acute abnormality. Musculoskeletal: Multiple thoracic compression deformities likely indicating osteoporosis. No destructive bone lesions. Focal areas of sclerosis demonstrated in the L1 vertebral body are  nonspecific but could represent metastatic disease. If there is a history of primary neoplasm, bone scan may be useful in further evaluation. Review of the MIP images confirms the above findings. IMPRESSION: 1. No evidence of significant pulmonary embolus. 2. Aortic atherosclerosis.  Coronary artery calcifications. 3. Focal atelectasis or consolidation in the left lung base with small left pleural effusion possibly representing pneumonia. 4. Multiple thoracic vertebral compression deformities likely indicating osteoporosis. 5. Small focal areas of sclerosis demonstrated in the L1 vertebral body are nonspecific but could represent metastatic disease. Consider bone scan for further evaluation if there is a history of primary neoplasm. Aortic Atherosclerosis (ICD10-I70.0). Electronically Signed   By: Lucienne Capers M.D.   On: 04/27/2017 22:09    Procedures Procedures (including critical  care time)  Medications Ordered in ED Medications  iopamidol (ISOVUE-370) 76 % injection 100 mL (100 mLs Intravenous Contrast Given 04/27/17 2148)     Initial Impression / Assessment and Plan / ED Course  I have reviewed the triage vital signs and the nursing notes.  Pertinent labs & imaging results that were available during my care of the patient were reviewed by me and considered in my medical decision making (see chart for details).     Pt w/ sharp L lower chest pain worse w/ cough and inspiration, hospitalized last week for pneumonia.  Well- appearing on exam with normal vital signs.  EKG similar to previous.  Serial troponins and basic lab work reassuring.  Obtain CTA of chest because of patient's recent hospitalization and pleuritic pain.  CTA was negative for PE, does redemonstrate consolidation in left lower lung corresponding with pneumonia.  Because this is the area of her pain, I suspect that her chest pain is related to the pneumonia itself rather than ACS or other acute life-threatening process.   Incidental finding of sclerosis at L1 vertebral body with recommendation of bone scan.  I discussed this finding with the patient and need for follow-up.  Regarding her pneumonia, because she is having this new pain and has persistence of infection on CT, I recommended switching her to doxycycline which is a more appropriate choice for CAP than augmentin.  Recommended close PCP follow-up.  Extensively reviewed return precautions.  She voiced understanding and was discharged in satisfactory condition.  Final Clinical Impressions(s) / ED Diagnoses   Final diagnoses:  Chest pain, unspecified type  Pneumonia of left lower lobe due to infectious organism [facility])    ED Discharge Orders        Ordered    doxycycline (VIBRAMYCIN) 100 MG capsule  2 times daily     04/28/17 0008       Peyton Rossner, Wenda Overland, MD 04/28/17 [phone]

## 2017-04-28 ENCOUNTER — Inpatient Hospital Stay: Payer: Medicare Other | Admitting: Internal Medicine

## 2017-04-28 MED ORDER — DOXYCYCLINE HYCLATE 100 MG PO CAPS: 100.0000 mg | ORAL_CAPSULE | Freq: Two times a day (BID) | ORAL | 0 refills | Status: DC

## 2017-05-04 ENCOUNTER — Encounter: Payer: Self-pay | Admitting: Internal Medicine

## 2017-05-04 ENCOUNTER — Ambulatory Visit (INDEPENDENT_AMBULATORY_CARE_PROVIDER_SITE_OTHER)
Admission: RE | Admit: 2017-05-04 | Discharge: 2017-05-04 | Disposition: A | Discharge status: Home / Self Care | Payer: Medicare HMO | Source: Ambulatory Visit | Attending: Internal Medicine | Admitting: Internal Medicine

## 2017-05-04 ENCOUNTER — Ambulatory Visit (INDEPENDENT_AMBULATORY_CARE_PROVIDER_SITE_OTHER): Payer: Medicare HMO | Admitting: Internal Medicine

## 2017-05-04 VITALS — BP 130/70 | HR 71 | Temp 97.8°F | Resp 16 | Ht 64.0 in | Wt 136.2 lb

## 2017-05-04 DIAGNOSIS — J189 Pneumonia, unspecified organism: Secondary | ICD-10-CM

## 2017-05-04 DIAGNOSIS — J181 Lobar pneumonia, unspecified organism: Secondary | ICD-10-CM | POA: Diagnosis not present

## 2017-05-04 DIAGNOSIS — R079 Chest pain, unspecified: Secondary | ICD-10-CM | POA: Diagnosis not present

## 2017-05-04 DIAGNOSIS — J90 Pleural effusion, not elsewhere classified: Secondary | ICD-10-CM | POA: Diagnosis not present

## 2017-05-04 NOTE — Progress Notes (Signed)
Subjective:  Patient ID: Carrie [name], female    DOB: June 27, 1945  Age: 72 y.o. MRN: [id number]  CC: Chest Pain   HPI [name] presents for f/up - she was recently seen in the emergency department and was diagnosed with left lower lobe pneumonia.  She has completed a course of doxycycline and feels much better.  Her cough has resolved.  Her only complaint now is intermittent left lateral lower chest wall pain that occurs with deep breath.  She denies hemoptysis, shortness of breath, fever, chills, or night sweats.  A CT scan was negative for pulmonary embolus.  Outpatient Medications Prior to Visit  Medication Sig Dispense Refill  . apixaban (ELIQUIS) 5 MG TABS tablet Take 1 tablet (5 mg total) by mouth 2 (two) times daily. 60 tablet 6  . CALCIUM PO Take by mouth daily.    . Cholecalciferol (VITAMIN D3) 1000 UNITS CAPS Take 1 capsule by mouth daily.      Marland Kitchen doxycycline (VIBRAMYCIN) 100 MG capsule Take 1 capsule (100 mg total) by mouth 2 (two) times daily. 14 capsule 0  . fish oil-omega-3 fatty acids 1000 MG capsule Take 1 g by mouth daily.      . GuaiFENesin (MUCINEX PO) Take by mouth.    . metoprolol succinate (TOPROL-XL) 50 MG 24 hr tablet TAKE 1 TABLET BY MOUTH DAILY. TAKE WITH OR IMMEDIATELY FOLLOWING A MEAL. 90 tablet 2  . nitroGLYCERIN (NITROLINGUAL) 0.4 MG/SPRAY spray Place 1 spray under the tongue every 5 (five) minutes x 3 doses as needed for chest pain.     . nitroGLYCERIN (NITROLINGUAL) 0.4 MG/SPRAY spray Place 1 spray under the tongue 3 times/day as needed-between meals & bedtime.    . Thiamine HCl (VITAMIN B-1) 250 MG tablet Take 500 mg by mouth daily.      . rosuvastatin (CRESTOR) 10 MG tablet Take 1 tablet (10 mg total) by mouth daily. 90 tablet 3   Facility-Administered Medications Prior to Visit  Medication Dose Route Frequency Provider Last Rate Last Dose  . 0.9 %  sodium chloride infusion  500 mL Intravenous Once Nandigam, Kavitha V, MD        ROS Review of  Systems  Constitutional: Negative for chills, diaphoresis, fatigue and fever.  HENT: Negative.  Negative for sore throat and trouble swallowing.   Respiratory: Negative for cough, chest tightness, shortness of breath and wheezing.   Cardiovascular: Positive for chest pain. Negative for palpitations and leg swelling.  Gastrointestinal: Negative for abdominal pain, diarrhea, nausea and vomiting.  Endocrine: Negative.   Genitourinary: Negative.   Musculoskeletal: Negative.  Negative for back pain.  Skin: Negative.  Negative for rash.  Allergic/Immunologic: Negative.   Neurological: Negative.  Negative for dizziness, weakness and light-headedness.  Hematological: Negative for adenopathy. Does not bruise/bleed easily.  Psychiatric/Behavioral: Negative.     Objective:  BP 130/70 (BP Location: Left Arm, Patient Position: Sitting, Cuff Size: Normal)   Pulse 71   Temp 97.8 F (36.6 C) (Oral)   Resp 16   Ht 5\' 4"  (1.626 m)   Wt 136 lb 4 oz (61.8 kg)   SpO2 97%   BMI 23.39 kg/m   BP Readings from Last 3 Encounters:  05/05/17 130/80  05/04/17 130/70  04/27/17 104/87    Wt Readings from Last 3 Encounters:  05/05/17 136 lb 9.6 oz (62 kg)  05/04/17 136 lb 4 oz (61.8 kg)  04/27/17 134 lb (60.8 kg)    Physical Exam  Constitutional: She is  oriented to person, place, and time. No distress.  HENT:  Mouth/Throat: Oropharynx is clear and moist. No oropharyngeal exudate.  Eyes: Conjunctivae are normal. Left eye exhibits no discharge. No scleral icterus.  Neck: Normal range of motion. Neck supple. No JVD present. No thyromegaly present.  Cardiovascular: Normal rate, regular rhythm, S1 normal and S2 normal. Exam reveals no gallop and no friction rub.  Murmur heard.  Systolic murmur is present with a grade of 3/6. Pulmonary/Chest: Effort normal and breath sounds normal. No respiratory distress. She has no wheezes. She has no rales.  Abdominal: Soft. Bowel sounds are normal. She exhibits no  distension and no mass. There is no tenderness.  Musculoskeletal: Normal range of motion. She exhibits no edema or tenderness.  Lymphadenopathy:    She has no cervical adenopathy.  Neurological: She is alert and oriented to person, place, and time.  Skin: Skin is warm and dry. No rash noted. She is not diaphoretic. No erythema. No pallor.  Vitals reviewed.   Lab Results  Component Value Date   WBC 10.6 (H) 04/27/2017   HGB 13.1 04/27/2017   HCT 38.8 04/27/2017   PLT 367 04/27/2017   GLUCOSE 99 04/27/2017   CHOL 192 11/22/2016   TRIG 111 11/22/2016   HDL 81 11/22/2016   LDLDIRECT 133.8 12/19/2012   LDLCALC 89 11/22/2016   ALT 17 11/22/2016   AST 20 11/22/2016   NA 135 04/27/2017   K 3.6 04/27/2017   CL 103 04/27/2017   CREATININE 0.55 04/27/2017   BUN 10 04/27/2017   CO2 24 04/27/2017   TSH 0.79 01/20/2016    Dg Chest 2 View  Result Date: 04/27/2017 CLINICAL DATA:  Left-sided chest pain under the left lower rib cage today. Patient was at [facility] for pneumonia last week. Former smoker. EXAM: CHEST  2 VIEW COMPARISON:  11/10/2016 FINDINGS: Shallow inspiration with atelectasis in the lung bases. Blunting of the left costophrenic angle with more pronounced infiltration, likely representing residual pneumonia and effusion. Right lung is clear. Heart size and pulmonary vascularity are normal for technique. Calcification of the aorta. No pneumothorax. Midthoracic vertebral compression deformities without change since prior study. IMPRESSION: Shallow inspiration. Probable residual infiltration and effusion in the left base. Aortic atherosclerosis. Electronically Signed   By: Lucienne Capers M.D.   On: 04/27/2017 20:32   Ct Angio Chest Pe W/cm &/or Wo Cm  Result Date: 04/27/2017 CLINICAL DATA:  Persistent chest pain starting this morning. Pain below the left breast, worse with inspiration. History of coronary artery disease. Former tobacco user. EXAM: CT ANGIOGRAPHY CHEST WITH  CONTRAST TECHNIQUE: Multidetector CT imaging of the chest was performed using the standard protocol during bolus administration of intravenous contrast. Multiplanar CT image reconstructions and MIPs were obtained to evaluate the vascular anatomy. CONTRAST:  71 mL ISOVUE-370 IOPAMIDOL (ISOVUE-370) INJECTION 76% COMPARISON:  Chest radiograph 04/27/2017 FINDINGS: Cardiovascular: Good opacification of the central and segmental pulmonary arteries. No focal filling defects demonstrated. No evidence of significant pulmonary embolus. Normal heart size. No pericardial effusion. Coronary artery calcifications. Normal caliber thoracic aorta with scattered calcification. No aortic dissection. Great vessel origins are patent. Suggestion of prominent calcific stenosis of the left subclavian artery origin. Mediastinum/Nodes: No enlarged mediastinal, hilar, or axillary lymph nodes. Thyroid gland, trachea, and esophagus demonstrate no significant findings. Lungs/Pleura: Atelectasis or consolidation in the left lung base with small left pleural effusion. This may represent pneumonia. Right lung is clear. No pneumothorax. Upper Abdomen: No acute abnormality. Musculoskeletal: Multiple thoracic compression deformities  likely indicating osteoporosis. No destructive bone lesions. Focal areas of sclerosis demonstrated in the L1 vertebral body are nonspecific but could represent metastatic disease. If there is a history of primary neoplasm, bone scan may be useful in further evaluation. Review of the MIP images confirms the above findings. IMPRESSION: 1. No evidence of significant pulmonary embolus. 2. Aortic atherosclerosis.  Coronary artery calcifications. 3. Focal atelectasis or consolidation in the left lung base with small left pleural effusion possibly representing pneumonia. 4. Multiple thoracic vertebral compression deformities likely indicating osteoporosis. 5. Small focal areas of sclerosis demonstrated in the L1 vertebral body  are nonspecific but could represent metastatic disease. Consider bone scan for further evaluation if there is a history of primary neoplasm. Aortic Atherosclerosis (ICD10-I70.0). Electronically Signed   By: Lucienne Capers M.D.   On: 04/27/2017 22:09    Dg Chest 2 View  Result Date: 05/04/2017 CLINICAL DATA:  Left anterior chest pain.  Pneumonia. EXAM: CHEST  2 VIEW COMPARISON:  CT scan and chest x-ray dated 04/27/2017 FINDINGS: The infiltrate at the left lung base has resolved. There is a small residual left pleural effusion. Right lung is clear except for minimal linear atelectasis at the right lung base posteriorly. Heart size and pulmonary vascularity are normal. Again noted are multiple old thoracic compression fractures, unchanged. Aortic atherosclerosis. IMPRESSION: 1. Resolution of the small infiltrate at the left lower lobe posteriorly. 2. Small residual left pleural effusion. Electronically Signed   By: Lorriane Shire M.D.   On: 05/04/2017 15:25    Assessment & Plan:   Landon was seen today for chest pain.  Diagnoses and all orders for this visit:  Pneumonia of left lower lobe due to infectious organism [facility])- Based on her symptoms and chest x-ray this has improved.  The only remaining finding is a small effusion. -     DG Chest 2 View; Future  Pleurisy with effusion- She tells me she is getting symptom relief with over-the-counter doses of Tylenol.  She did not want me to write a prescription for stronger pain relief.  She will let me know if she develops any worsening or new symptoms.   I am having [place]. Rewerts maintain her fish oil-omega-3 fatty acids, nitroGLYCERIN, vitamin B-1, Vitamin D3, CALCIUM PO, rosuvastatin, metoprolol succinate, apixaban, nitroGLYCERIN, GuaiFENesin (MUCINEX PO), and doxycycline. We will continue to administer sodium chloride.  No orders of the defined types were placed in this encounter.    Follow-up: Return if symptoms worsen or fail to  improve.  Scarlette Calico, MD

## 2017-05-04 NOTE — Patient Instructions (Signed)
Pleurisy Pleurisy, also called pleuritis, is irritation and swelling (inflammation) of the linings of the lungs. The linings of the lungs are called pleura. They cover the outside of the lungs and the inside of the chest wall. There is a small amount of fluid (pleural fluid) between the pleura that allows the lungs to move in and out smoothly when you breathe. Pleurisy causes the pleura to be rough and dry and to rub together when you breathe, which is painful. In some cases, pleurisy can cause pleural fluid to build up between the pleura (pleural effusion). What are the causes? Common causes of this condition include:  A lung infection caused by bacteria or a virus.  A blood clot that travels to the lung (pulmonary embolism).  Air leaking into the pleural space (pneumothorax).  Lung cancer or a lung tumor.  A chest injury.  Diseases that can cause lung inflammation. These include rheumatoid arthritis, lupus, sickle cell disease, inflammatory bowel disease, and pancreatitis.  Heart or chest surgery.  Lung damage from inhaling asbestos.  A lung reaction to certain medicines.  Sometimes the cause is unknown. What are the signs or symptoms? Chest pain is the main symptom of this condition. The pain is usually on one side. Chest pain may start suddenly and be sharp or stabbing. It may become a constant dull ache. You may also feel pain in your back or shoulder. The pain may get worse when you cough, take deep breaths, or make sudden movements. Other symptoms may include:  Shortness of breath.  Noisy breathing (wheezing).  Cough.  Chills.  Fever.  How is this diagnosed? This condition may be diagnosed based on:  Your medical history.  Your symptoms.  A physical exam. Your health care provider will listen to your breathing with a stethoscope to check for a rough, rubbing sound (friction rub). If you have pleural effusion, your breathing sounds may be muffled.  Tests, such  as: ? Blood tests to check for infections or diseases and to measure the oxygen in your blood. ? Imaging studies of your lungs. These may include a chest X-ray, ultrasound, MRI, or CT scan. ? A procedure to remove pleural fluid with a needle for testing (thoracentesis).  How is this treated? Treatment for this condition depends on the cause. Pleurisy that was caused by a virus usually clears up within 2 weeks. Treatment for pleurisy may include:  NSAIDs to help relieve pain and swelling.  Antibiotic medicines, if your condition was caused by a bacterial infection.  Prescription pain or cough medicine.  Medicines to dissolve a blood clot, if your condition was caused by pulmonary embolism.  Removal of pleural fluid or air.  Follow these instructions at home: Medicines  Take over-the-counter and prescription medicines only as told by your health care provider.  If you were prescribed an antibiotic, take it as told by your health care provider. Do not stop taking the antibiotic even if you start to feel better. Activity  Rest and return to your normal activities as told by your health care provider. Ask your health care provider what activities are safe for you.  Do not drive or use heavy machinery while taking prescription pain medicine. General instructions  Monitor your pleurisy for any changes.  Take deep breaths often, even if it is painful. This can help prevent lung infection (pneumonia) and collapse of lung tissue (atelectasis).  When lying down, lie on your painful side. This may reduce pain.  Do not smoke. If   you need help quitting, ask your health care provider.  Keep all follow-up visits as told by your health care provider. This is important. Contact a health care provider if:  You have pain that: ? Gets worse. ? Does not get better with medicine. ? Lasts for more than 1 week.  You have a fever or chills.  Your cough or shortness of breath is not improving  at home.  You cough up pus-like (purulent) secretions. Get help right away if:  Your lips, fingernails, or toenails darken or turn blue.  You cough up blood.  You have any of the following symptoms that get worse: ? Difficulty breathing. ? Shortness of breath. ? Wheezing.  You have pain that spreads into your neck, arms, or jaw.  You develop a rash.  You vomit.  You faint. Summary  Pleurisy is inflammation of the linings of the lungs (pleura).  Pleurisy causes pain that makes it difficult for you to breathe or cough.  Pleurisy is often caused by an underlying infection or disease.  Treatment of pleurisy depends on the cause, and it often includes medicines. This information is not intended to replace advice given to you by your health care provider. Make sure you discuss any questions you have with your health care provider. Document Released: 04/12/2005 Document Revised: 01/05/2016 Document Reviewed: 01/05/2016 Elsevier Interactive Patient Education  2017 [employer] Inc[employer]  

## 2017-05-05 ENCOUNTER — Encounter: Payer: Self-pay | Admitting: Gastroenterology

## 2017-05-05 ENCOUNTER — Ambulatory Visit: Payer: Medicare Other | Admitting: Gastroenterology

## 2017-05-05 VITALS — BP 130/80 | HR 68 | Ht 64.0 in | Wt 136.6 lb

## 2017-05-05 DIAGNOSIS — K625 Hemorrhage of anus and rectum: Secondary | ICD-10-CM | POA: Diagnosis not present

## 2017-05-05 NOTE — Progress Notes (Signed)
05/05/2017 Carrie [name] [id number] 22-Feb-1946   HISTORY OF PRESENT ILLNESS:  This is a 72 year old female who is known to Dr. Silverio Decamp for recent colonoscopy.  That was performed on 04/15/2017 at which time she was found to have several polyps that were removed, moderate diverticulosis throughout the colon, and small internal hemorrhoids.  One polyp was 11 mm and was located at the IC valve.  All polyps were tubular adenomas.  Anyway, she is here for a hospital follow-up.  She was hospitalized at [facility] 6 days after her colonoscopy for PNA and while she was there she was having a moderate amount of rectal bleeding.  She was apparently seen by GI there but nothing was done and bleeding resolved.  They recommended follow-up here.  She has not had any further bleeding since that time.  Is moving her bowels well.  No abdominal pain.  Appetite is good.  Is finally recovering well from the PNA as well.     Past Medical History:  Diagnosis Date  . Aortic stenosis   . Coronary artery disease    a. Prior LHC (07/24/03): LMCA heavily calcified without significant stenosis, LAD with heavy calcification proximally. 50-60% stenosis just distal to D2, LCx calcified proximally without significant stenosis, Dominant RCA with moderate proximal calcification and mild luminal irregularities throughout, LVEDP 12, LVEF 70%  . Diastolic dysfunction without heart failure   . Former tobacco use   . Former tobacco use   . HOCM (hypertrophic obstructive cardiomyopathy) ([place])   . Mild AI (aortic insufficiency)   . Osteoporosis, unspecified   . Other and unspecified hyperlipidemia   . Paroxysmal atrial flutter ([place])   . Pneumonia   . Wears hearing aid    left   Past Surgical History:  Procedure Laterality Date  . ATRIAL ABLATION SURGERY  2005  . BREAST SURGERY  1982   Benign tumor removed RT  . BUNIONECTOMY  04/07/2012   Procedure: Lillard Anes;  Surgeon: Alta Corning, MD;  Location: [facility];  Service: Orthopedics;  Laterality: Left;  CHEVRON OSTEOTOMY LEFT FOOT   . CARDIAC CATHETERIZATION  2005  . GANGLION CYST EXCISION     left wrist  . HERNIA REPAIR  1990   rt ing  . TONSILLECTOMY    . TUBAL LIGATION      reports that she quit smoking about 18 years ago. she has never used smokeless tobacco. She reports that she drinks about 4.2 oz of alcohol per week. She reports that she does not use drugs. family history includes Congestive Heart Failure in her mother; Dementia in her mother; Heart disease (age of onset: 49) in her mother; Hypertension in her other; Valvular heart disease in her father. Allergies  Allergen Reactions  . Codeine Nausea And Vomiting  . Crestor [Rosuvastatin] Other (See Comments)    Crestor 40mg  fatigue and exhaustion--pt tolerates crestor 20mg   . Latex Rash      Outpatient Encounter Medications as of 05/05/2017  Medication Sig  . apixaban (ELIQUIS) 5 MG TABS tablet Take 1 tablet (5 mg total) by mouth 2 (two) times daily.  Marland Kitchen CALCIUM PO Take by mouth daily.  . Cholecalciferol (VITAMIN D3) 1000 UNITS CAPS Take 1 capsule by mouth daily.    Marland Kitchen doxycycline (VIBRAMYCIN) 100 MG capsule Take 1 capsule (100 mg total) by mouth 2 (two) times daily.  . fish oil-omega-3 fatty acids 1000 MG capsule Take 1 g by mouth daily.    Marland Kitchen  GuaiFENesin (MUCINEX PO) Take by mouth.  . metoprolol succinate (TOPROL-XL) 50 MG 24 hr tablet TAKE 1 TABLET BY MOUTH DAILY. TAKE WITH OR IMMEDIATELY FOLLOWING A MEAL.  . nitroGLYCERIN (NITROLINGUAL) 0.4 MG/SPRAY spray Place 1 spray under the tongue every 5 (five) minutes x 3 doses as needed for chest pain.   . nitroGLYCERIN (NITROLINGUAL) 0.4 MG/SPRAY spray Place 1 spray under the tongue 3 times/day as needed-between meals & bedtime.  . Thiamine HCl (VITAMIN B-1) 250 MG tablet Take 500 mg by mouth daily.    . rosuvastatin (CRESTOR) 10 MG tablet Take 1 tablet (10 mg total) by mouth daily.   Facility-Administered Encounter  Medications as of 05/05/2017  Medication  . 0.9 %  sodium chloride infusion     REVIEW OF SYSTEMS  : All other systems reviewed and negative except where noted in the History of Present Illness.   PHYSICAL EXAM: BP 130/80   Pulse 68   Ht 5\' 4"  (1.626 m)   Wt 136 lb 9.6 oz (62 kg)   SpO2 96%   BMI 23.45 kg/m  General: Well developed white female in no acute distress Head: Normocephalic and atraumatic Eyes:  Sclerae anicteric, conjunctiva pink. Ears: Normal auditory acuity Lungs: Clear throughout to auscultation; no increased WOB. Heart: Regular rate and rhythm; 3/6 SEM noted. Abdomen: Soft, non-distended. Normal bowel sounds.  Non-tender. Musculoskeletal: Symmetrical with no gross deformities  Skin: No lesions on visible extremities Extremities: No edema  Neurological: Alert oriented x 4, grossly non-focal Psychological:  Alert and cooperative. Normal mood and affect  ASSESSMENT AND PLAN: *Rectal bleeding:  Self-limited and occurred 6-7 days following colonoscopy.  Possibly related to polypectomy, but has resolved and has not recurred.  Hgb is normal.  No evaluation needed at this time.  Monitor and follow-up prn.   CC:  [name], MD

## 2017-05-09 NOTE — Progress Notes (Signed)
Reviewed and agree with documentation and assessment and plan. K. Veena Nandigam , MD   

## 2017-06-07 DIAGNOSIS — R69 Illness, unspecified: Secondary | ICD-10-CM | POA: Diagnosis not present

## 2017-06-23 ENCOUNTER — Ambulatory Visit (INDEPENDENT_AMBULATORY_CARE_PROVIDER_SITE_OTHER): Payer: Medicare HMO | Admitting: Family Medicine

## 2017-06-23 ENCOUNTER — Encounter: Payer: Self-pay | Admitting: Family Medicine

## 2017-06-23 VITALS — BP 142/80 | HR 61 | Temp 98.0°F | Ht 64.0 in | Wt 133.0 lb

## 2017-06-23 DIAGNOSIS — J069 Acute upper respiratory infection, unspecified: Secondary | ICD-10-CM

## 2017-06-23 NOTE — Patient Instructions (Signed)
Please continue to monitor your symptoms  Please follow up if your symptoms.

## 2017-06-23 NOTE — Progress Notes (Signed)
Carrie [name] - 72 y.o. female MRN [id number]  Date of birth: [birth year]  SUBJECTIVE:  Including CC & ROS.  Chief Complaint  Patient presents with  . Hoarse    Carrie [name] is a 72 y.o. female that is presenting with hoarseness. Ongoing for two days. Admits to fatigue. Denies fevers or body aches. Treated for pneumonia in December. Denies shortness of breath or wheezing. Denies sore throat. Denies chest pain.    Review of Systems  Constitutional: Negative for fever.  HENT: Positive for voice change.   Respiratory: Negative for cough.   Cardiovascular: Negative for chest pain.  Gastrointestinal: Negative for abdominal pain.  Musculoskeletal: Negative for back pain.  Skin: Negative for color change.  Neurological: Negative for weakness.  Hematological: Negative for adenopathy.  Psychiatric/Behavioral: Negative for agitation.    HISTORY: Past Medical, Surgical, Social, and Family History Reviewed & Updated per EMR.   Pertinent Historical Findings include:  Past Medical History:  Diagnosis Date  . Aortic stenosis   . Coronary artery disease    a. Prior LHC (07/24/03): LMCA heavily calcified without significant stenosis, LAD with heavy calcification proximally. 50-60% stenosis just distal to D2, LCx calcified proximally without significant stenosis, Dominant RCA with moderate proximal calcification and mild luminal irregularities throughout, LVEDP 12, LVEF 70%  . Diastolic dysfunction without heart failure   . Former tobacco use   . Former tobacco use   . HOCM (hypertrophic obstructive cardiomyopathy) ([place])   . Mild AI (aortic insufficiency)   . Osteoporosis, unspecified   . Other and unspecified hyperlipidemia   . Paroxysmal atrial flutter ([place])   . Pneumonia   . Wears hearing aid    left    Past Surgical History:  Procedure Laterality Date  . ATRIAL ABLATION SURGERY  2005  . BREAST SURGERY  1982   Benign tumor removed RT  . BUNIONECTOMY  04/07/2012   Procedure:  Lillard Anes;  Surgeon: Alta Corning, MD;  Location: [facility];  Service: Orthopedics;  Laterality: Left;  CHEVRON OSTEOTOMY LEFT FOOT   . CARDIAC CATHETERIZATION  2005  . GANGLION CYST EXCISION     left wrist  . HERNIA REPAIR  1990   rt ing  . TONSILLECTOMY    . TUBAL LIGATION      Allergies  Allergen Reactions  . Codeine Nausea And Vomiting  . Crestor [Rosuvastatin] Other (See Comments)    Crestor 40mg  fatigue and exhaustion--pt tolerates crestor 20mg   . Latex Rash    Family History  Problem Relation Age of Onset  . Heart disease Mother 83  . Congestive Heart Failure Mother   . Dementia Mother   . Hypertension Other   . Valvular heart disease Father        Had valves replaced  . Colon cancer Neg Hx   . Rectal cancer Neg Hx      Social History   Socioeconomic History  . Marital status: Divorced    Spouse name: Not on file  . Number of children: Not on file  . Years of education: Not on file  . Highest education level: Not on file  Social Needs  . Financial resource strain: Not on file  . Food insecurity - worry: Not on file  . Food insecurity - inability: Not on file  . Transportation needs - medical: Not on file  . Transportation needs - non-medical: Not on file  Occupational History  . Not on file  Tobacco Use  . Smoking status: Former  Smoker    Last attempt to quit: 04/05/1999    Years since quitting: 18.2  . Smokeless tobacco: Never Used  . Tobacco comment: Regular Exercise-No  Substance and Sexual Activity  . Alcohol use: Yes    Alcohol/week: 4.2 oz    Types: 7 Glasses of wine per week    Comment: 1 drink per week  . Drug use: No  . Sexual activity: Not on file  Other Topics Concern  . Not on file  Social History Narrative  . Not on file     PHYSICAL EXAM:  VS: BP (!) 142/80 (BP Location: Left Arm, Patient Position: Sitting, Cuff Size: Normal)   Pulse 61   Temp 98 F (36.7 C) (Oral)   Ht 5\' 4"  (1.626 m)   Wt 133 lb (60.3  kg)   SpO2 96%   BMI 22.83 kg/m  Physical Exam Gen: NAD, alert, cooperative with exam, well-appearing ENT: normal lips, normal nasal mucosa, tympanic membranes clear and intact bilaterally, normal oropharynx, no cervical lymphadenopathy Eye: normal EOM, normal conjunctiva and lids CV:  no edema, +2 pedal pulses, regular rate and rhythm, S1-S2   Resp: no accessory muscle use, non-labored, clear to auscultation bilaterally, no crackles or wheezes Skin: no rashes, no areas of induration  Neuro: normal tone, normal sensation to touch Psych:  normal insight, alert and oriented MSK: Normal gait, normal strength       ASSESSMENT & PLAN:   Upper respiratory tract infection Symptoms likely related to a viral origin.  - counseled on supportive care - given indications to follow up.

## 2017-06-24 DIAGNOSIS — J069 Acute upper respiratory infection, unspecified: Secondary | ICD-10-CM | POA: Insufficient documentation

## 2017-06-24 NOTE — Assessment & Plan Note (Signed)
Symptoms likely related to a viral origin.  - counseled on supportive care - given indications to follow up.

## 2017-08-19 ENCOUNTER — Ambulatory Visit (INDEPENDENT_AMBULATORY_CARE_PROVIDER_SITE_OTHER): Payer: Medicare HMO | Admitting: Family

## 2017-08-19 ENCOUNTER — Encounter: Payer: Self-pay | Admitting: Family

## 2017-08-19 ENCOUNTER — Ambulatory Visit (INDEPENDENT_AMBULATORY_CARE_PROVIDER_SITE_OTHER)
Admission: RE | Admit: 2017-08-19 | Discharge: 2017-08-19 | Disposition: A | Discharge status: Home / Self Care | Payer: Medicare HMO | Source: Ambulatory Visit | Attending: Family | Admitting: Family

## 2017-08-19 VITALS — BP 130/80 | HR 95 | Temp 98.7°F | Ht 64.0 in | Wt 130.0 lb

## 2017-08-19 DIAGNOSIS — R05 Cough: Secondary | ICD-10-CM

## 2017-08-19 DIAGNOSIS — R058 Other specified cough: Secondary | ICD-10-CM

## 2017-08-19 MED ORDER — FLUTICASONE PROPIONATE 50 MCG/ACT NA SUSP: 2.0000 | Freq: Every day | NASAL | 6 refills | Status: DC

## 2017-08-19 MED ORDER — BENZONATATE 100 MG PO CAPS: 100.0000 mg | ORAL_CAPSULE | Freq: Three times a day (TID) | ORAL | 0 refills | Status: DC | PRN

## 2017-08-19 MED ORDER — AZITHROMYCIN 250 MG PO TABS: ORAL_TABLET | ORAL | 0 refills | Status: DC

## 2017-08-19 NOTE — Progress Notes (Signed)
Carrie [name] is a 72 y.o. female with the following history as recorded in EpicCare:  Patient Active Problem List   Diagnosis Date Noted  . Upper respiratory tract infection 06/24/2017  . Rectal bleeding 05/05/2017  . Pneumonia of left lower lobe due to infectious organism ([place]) 05/04/2017  . Positive colorectal cancer screening using Cologuard test 02/24/2017  . Coronary artery disease involving native coronary artery of native heart without angina pectoris 02/24/2017  . Pure hypercholesterolemia 02/24/2017  . Hypertrophic cardiomyopathy ([place]) 11/22/2016  . Elevated LFTs 08/16/2016  . Atrial flutter ([name]) 07/08/2016  . Varicose veins of lower extremities with other complications 97/35/3299  . Allergic rhinitis 12/28/2012  . OAB (overactive bladder) 06/22/2012  . Routine general medical examination at a health care facility 06/22/2012  . Moderate aortic stenosis 09/05/2010  . Coronary artery disease due to lipid rich plaque 06/26/2009  . SUPRAVENTRICULAR TACHYCARDIA 06/23/2009  . Hyperlipidemia 03/06/2009  . Osteoporosis 03/06/2009    Current Outpatient Medications  Medication Sig Dispense Refill  . apixaban (ELIQUIS) 5 MG TABS tablet Take 1 tablet (5 mg total) by mouth 2 (two) times daily. 60 tablet 6  . CALCIUM PO Take by mouth daily.    . Cholecalciferol (VITAMIN D3) 1000 UNITS CAPS Take 1 capsule by mouth daily.      . fish oil-omega-3 fatty acids 1000 MG capsule Take 1 g by mouth daily.      . GuaiFENesin (MUCINEX PO) Take by mouth.    . metoprolol succinate (TOPROL-XL) 50 MG 24 hr tablet TAKE 1 TABLET BY MOUTH DAILY. TAKE WITH OR IMMEDIATELY FOLLOWING A MEAL. 90 tablet 2  . nitroGLYCERIN (NITROLINGUAL) 0.4 MG/SPRAY spray Place 1 spray under the tongue 3 times/day as needed-between meals & bedtime.    . rosuvastatin (CRESTOR) 10 MG tablet Take 10 mg by mouth daily.  3  . Thiamine HCl (VITAMIN B-1) 250 MG tablet Take 500 mg by mouth daily.      Marland Kitchen azithromycin (ZITHROMAX)  250 MG tablet 2 tabs po qd x 1 day; 1 tablet per day x 4 days; 6 tablet 0  . benzonatate (TESSALON) 100 MG capsule Take 1 capsule (100 mg total) by mouth 3 (three) times daily as needed. 20 capsule 0  . fluticasone (FLONASE) 50 MCG/ACT nasal spray Place 2 sprays into both nostrils daily. 16 g 6   Current Facility-Administered Medications  Medication Dose Route Frequency Provider Last Rate Last Dose  . 0.9 %  sodium chloride infusion  500 mL Intravenous Once Nandigam, Venia Minks, MD        Allergies: Codeine; Crestor [rosuvastatin]; and Latex  Past Medical History:  Diagnosis Date  . Aortic stenosis   . Coronary artery disease    a. Prior LHC (07/24/03): LMCA heavily calcified without significant stenosis, LAD with heavy calcification proximally. 50-60% stenosis just distal to D2, LCx calcified proximally without significant stenosis, Dominant RCA with moderate proximal calcification and mild luminal irregularities throughout, LVEDP 12, LVEF 70%  . Diastolic dysfunction without heart failure   . Former tobacco use   . Former tobacco use   . HOCM (hypertrophic obstructive cardiomyopathy) ([name])   . Mild AI (aortic insufficiency)   . Osteoporosis, unspecified   . Other and unspecified hyperlipidemia   . Paroxysmal atrial flutter ([name])   . Pneumonia   . Wears hearing aid    left    Past Surgical History:  Procedure Laterality Date  . ATRIAL ABLATION SURGERY  2005  . [place]  Benign tumor removed RT  . BUNIONECTOMY  04/07/2012   Procedure: Lillard Anes;  Surgeon: Alta Corning, MD;  Location: [facility];  Service: Orthopedics;  Laterality: Left;  CHEVRON OSTEOTOMY LEFT FOOT   . CARDIAC CATHETERIZATION  2005  . GANGLION CYST EXCISION     left wrist  . HERNIA REPAIR  1990   rt ing  . TONSILLECTOMY    . TUBAL LIGATION      Family History  Problem Relation Age of Onset  . Heart disease Mother 64  . Congestive Heart Failure Mother   . Dementia Mother   .  Hypertension Other   . Valvular heart disease Father        Had valves replaced  . Colon cancer Neg Hx   . Rectal cancer Neg Hx     Social History   Tobacco Use  . Smoking status: Former Smoker    Last attempt to quit: 04/05/1999    Years since quitting: 18.3  . Smokeless tobacco: Never Used  . Tobacco comment: Regular Exercise-No  Substance Use Topics  . Alcohol use: Yes    Alcohol/week: 4.2 oz    Types: 7 Glasses of wine per week    Comment: 1 drink per week    Subjective:  Patient presents with concerns for 1 day history of cough/ congestion- "scratchy sore throat." Had pneumonia in early January; seen for similar symptoms in early February- cleared with no issues; no fever; tried OTC Mucinex on Wednesday pm; cannot tolerate antihistamine- feels like causes her heart rate to elevate;   Objective:  Vitals:   08/19/17 1008  BP: 130/80  Pulse: 95  Temp: 98.7 F (37.1 C)  TempSrc: Oral  SpO2: 95%  Weight: 130 lb (59 kg)  Height: 5\' 4"  (1.626 m)    General: Well developed, well nourished, in no acute distress  Skin : Warm and dry.  Head: Normocephalic and atraumatic  Eyes: Sclera and conjunctiva clear; pupils round and reactive to light; extraocular movements intact  Ears: External normal; canals clear; tympanic membranes congested bilaterally Oropharynx: Pink, supple. No suspicious lesions  Neck: Supple without thyromegaly, adenopathy  Lungs: Respirations unlabored; clear to auscultation bilaterally without wheeze, rales, rhonchi  Neurologic: Alert and oriented; speech intact; face symmetrical; moves all extremities well; CNII-XII intact without focal deficit  Assessment:  1. Recurrent cough     Plan:  Suspect allergic component; trial of Flonase and Tessalon Per[place]; she is given Rx for Zithromax to hold and fill if symptoms worsening in the next 48 hours; increase fluids, rest; Will update CXR today due to mention of persistent pleural effusion on last CXR and  recurrent episodes of cough; may need to refer to pulmonology.   No follow-ups on file.  Orders Placed This Encounter  Procedures  . DG Chest 2 View    Standing Status:   Future    Standing Expiration Date:   10/20/2018    Order Specific Question:   Reason for Exam (SYMPTOM  OR DIAGNOSIS REQUIRED)    Answer:   recurrent cough    Order Specific Question:   Preferred imaging location?    Answer:   [facility]    Order Specific Question:   Radiology Contrast Protocol - do NOT remove file path    Answer:   [email or web address]    Requested Prescriptions   Signed Prescriptions Disp Refills  . fluticasone (FLONASE) 50 MCG/ACT nasal spray 16 g 6    Sig: Place 2 sprays into both  nostrils daily.  . benzonatate (TESSALON) 100 MG capsule 20 capsule 0    Sig: Take 1 capsule (100 mg total) by mouth 3 (three) times daily as needed.  Marland Kitchen azithromycin (ZITHROMAX) 250 MG tablet 6 tablet 0    Sig: 2 tabs po qd x 1 day; 1 tablet per day x 4 days;

## 2017-08-22 ENCOUNTER — Encounter: Payer: Self-pay | Admitting: Internal Medicine

## 2017-08-22 ENCOUNTER — Ambulatory Visit: Payer: Medicare HMO | Admitting: Internal Medicine

## 2017-08-22 VITALS — BP 122/68 | HR 76 | Ht 62.0 in | Wt 130.4 lb

## 2017-08-22 DIAGNOSIS — I25118 Atherosclerotic heart disease of native coronary artery with other forms of angina pectoris: Secondary | ICD-10-CM

## 2017-08-22 DIAGNOSIS — I350 Nonrheumatic aortic (valve) stenosis: Secondary | ICD-10-CM | POA: Diagnosis not present

## 2017-08-22 DIAGNOSIS — I480 Paroxysmal atrial fibrillation: Secondary | ICD-10-CM

## 2017-08-22 DIAGNOSIS — E785 Hyperlipidemia, unspecified: Secondary | ICD-10-CM

## 2017-08-22 MED ORDER — METOPROLOL SUCCINATE ER 50 MG PO TB24: 50.0000 mg | ORAL_TABLET | Freq: Every day | ORAL | 3 refills | Status: DC

## 2017-08-22 NOTE — Progress Notes (Signed)
Follow-up Outpatient Visit Date: 08/22/2017  Primary Care Provider: [name], MD [address] [address]  Chief Complaint: Follow-up CAD, HCM, PAF, and AS.  HPI:  Carrie [name] is a 72 y.o. year-old female with history of nonobstructive CAD, hypertrophic cardiomyopathy, PAF, and aortic stenosis (moderate by echo in 01/2016), who presents for follow-up of chest pain.  I last saw Carrie [name] in November, at which time she denied any further episodes of chest pain.  Her only complaints at that time were of difficulty losing weight as well as numbness in her hands.  She was seen in the emergency department in January due to left-sided chest pain that was worse with deep inspiration and coughing in the setting of preceding pneumonia.  Day, Carrie [name]'s only complaint is of seasonal allergies over the last week.  She has otherwise been doing well and has recovered from her pneumonia in 03/2017.  She denies chest pain, shortness of breath, palpitations, lightheadedness, and edema.  She is remaining active around the house and exercising when possible.  --------------------------------------------------------------------------------------------------  Cardiovascular History & Procedures: Cardiovascular Problems:  Paroxysmal atrial flutter  Non-obstructive coronary artery disease  Aortic stenosis  Risk Factors:  Known CAD and age > 78  Cath/PCI:  LHC (07/24/03): LMCA heavily calcified without significant stenosis. LAD with heavy calcification proximally. 50-60% stenosis just distal to D2. LCx calcified proximally without significant stenosis. Dominant RCA with moderate proximal calcification and mild luminal irregularities throughout. LVEDP 12. LVEF 70%.  CV Surgery:  None  EP Procedures and Devices:  30-day event monitor (12/17/15): Predominantly sinus rhythm with episode of atrial flutter with rate ~100 bpm.  Non-Invasive Evaluation(s):  TTE  (11/11/16): Normal LV size with mild focal basal hypertrophy and severe hypertrophy of the inferior wall. LVEF 55-60% with dynamic obstruction. Moderate aortic stenosis (mean gradient 25 mmHg). Mild MR. Moderate left atrial enlargement. Normal RV size and wall thickness. Mild TR. Upper normal PA pressure.  TTE (02/10/16): Normal LV size with mild LVH and focal basal hypertrophy of the septum. LVEF 55-60% with grade 1 diastolic dysfunction and elevated filling pressure. Calcified aortic valve with moderate stenosis (mean gradient 29 mmHg, AVA 1.01 cm^2). MAC with chordal SAM and mild MR. Mild TR. Normal RV size and function.  Recent CV Pertinent Labs: Lab Results  Component Value Date   CHOL 192 11/22/2016   HDL 81 11/22/2016   LDLCALC 89 11/22/2016   LDLDIRECT 133.8 12/19/2012   TRIG 111 11/22/2016   CHOLHDL 2.4 11/22/2016   CHOLHDL 2.3 08/18/2015   K 3.6 04/27/2017   BUN 10 04/27/2017   BUN 19 09/13/2016   CREATININE 0.55 04/27/2017   CREATININE 0.75 03/16/2016    Past medical and surgical history were reviewed and updated in EPIC.  Current Meds  Medication Sig  . apixaban (ELIQUIS) 5 MG TABS tablet Take 1 tablet (5 mg total) by mouth 2 (two) times daily.  Marland Kitchen CALCIUM PO Take 1 tablet by mouth daily.   . Cholecalciferol (VITAMIN D3) 1000 UNITS CAPS Take 1 capsule by mouth daily.    . fish oil-omega-3 fatty acids 1000 MG capsule Take 1 g by mouth daily.    . metoprolol succinate (TOPROL-XL) 50 MG 24 hr tablet TAKE 1 TABLET BY MOUTH DAILY. TAKE WITH OR IMMEDIATELY FOLLOWING A MEAL.  . nitroGLYCERIN (NITROLINGUAL) 0.4 MG/SPRAY spray Place 1 spray under the tongue 3 times/day as needed-between meals & bedtime.  . rosuvastatin (CRESTOR) 10 MG tablet Take 10  mg by mouth daily.  . Thiamine HCl (VITAMIN B-1) 250 MG tablet Take 500 mg by mouth daily.     Current Facility-Administered Medications for the 08/22/17 encounter (Office Visit) with Khanh Cordner, Harrell Gave, MD  Medication  . 0.9 %  sodium  chloride infusion    Allergies: Codeine; Crestor [rosuvastatin]; and Latex  Social History   Tobacco Use  . Smoking status: Former Smoker    Last attempt to quit: 04/05/1999    Years since quitting: 18.3  . Smokeless tobacco: Never Used  . Tobacco comment: Regular Exercise-No  Substance Use Topics  . Alcohol use: Yes    Alcohol/week: 4.2 oz    Types: 7 Glasses of wine per week    Comment: 1 drink per week  . Drug use: No    Family History  Problem Relation Age of Onset  . Heart disease Mother 59  . Congestive Heart Failure Mother   . Dementia Mother   . Hypertension Other   . Valvular heart disease Father        Had valves replaced  . Colon cancer Neg Hx   . Rectal cancer Neg Hx     Review of Systems: A 12-system review of systems was performed and was negative except as noted in the HPI.  --------------------------------------------------------------------------------------------------  Physical Exam: BP 122/68   Pulse 76   Ht 5' 2"  (1.575 m)   Wt 130 lb 6.4 oz (59.1 kg)   BMI 23.85 kg/m   General: NAD. HEENT: No conjunctival pallor or scleral icterus. Moist mucous membranes.  OP clear. Neck: Supple without lymphadenopathy, thyromegaly, JVD, or HJR. Lungs: Normal work of breathing. Clear to auscultation bilaterally without wheezes or crackles. Heart: Regular rate and rhythm with 2/6 systolic crescendo-decrescendo murmur loudest at the left upper sternal border.  No rubs or gallops. Abd: Bowel sounds present. Soft, NT/ND without hepatosplenomegaly Ext: No lower extremity edema. Radial, PT, and DP pulses are 2+ bilaterally. Skin: Warm and dry without rash.  EKG:  NSR with biatrial enlargement.  Lab Results  Component Value Date   WBC 10.6 (H) 04/27/2017   HGB 13.1 04/27/2017   HCT 38.8 04/27/2017   MCV 91.1 04/27/2017   PLT 367 04/27/2017    Lab Results  Component Value Date   NA 135 04/27/2017   K 3.6 04/27/2017   CL 103 04/27/2017   CO2 24  04/27/2017   BUN 10 04/27/2017   CREATININE 0.55 04/27/2017   GLUCOSE 99 04/27/2017   ALT 17 11/22/2016    Lab Results  Component Value Date   CHOL 192 11/22/2016   HDL 81 11/22/2016   LDLCALC 89 11/22/2016   LDLDIRECT 133.8 12/19/2012   TRIG 111 11/22/2016   CHOLHDL 2.4 11/22/2016    --------------------------------------------------------------------------------------------------  ASSESSMENT AND PLAN: Coronary artery disease with stable angina No significant chest pain since our last visit.  Angina well controlled with metoprolol succinate.  Continue with medical therapy to prevent progression of disease.  Paroxysmal atrial fibrillation No palpitations or other symptoms to suggest recurrence.  Given CHADSVASC score of at least 3, we will continue with indefinite anticoagulation with apixaban.  Aortic stenosis No symptoms to suggest critical AS.  Echo last July showed moderate aortic stenosis.  We will continue close clinical follow-up with repeat echo shortly before Carrie [name] returns to see me in 6 months.  Hyperlipidemia Continue rosuvastatin.  Defer additional up titration, given history of statin intolerance in the past.  Encouraged lifestyle modifications to help achieve LDL less than  70.  Follow-up: Return to clinic in 6 months.  Nelva Bush, MD 08/22/2017 1:30 PM

## 2017-08-22 NOTE — Patient Instructions (Addendum)
Medication Instructions:  Your physician recommends that you continue on your current medications as directed. Please refer to the Current Medication list given to you today.  -- If you need a refill on your cardiac medications before your next appointment, please call your pharmacy. --  Labwork: None ordered  Testing/Procedures: PLEASE Schedule to be done in 6 MONTHS prior to f/u visit   Your physician has requested that you have an echocardiogram. Echocardiography is a painless test that uses sound waves to create images of your heart. It provides your doctor with information about the size and shape of your heart and how well your heart's chambers and valves are working. This procedure takes approximately one hour. There are no restrictions for this procedure.   Follow-Up: Your physician wants you to follow-up in: 6 months with Dr. Saunders Revel.      Thank you for choosing [facility]!!    Any Other Special Instructions Will Be Listed Below (If Applicable).

## 2017-08-23 ENCOUNTER — Other Ambulatory Visit: Payer: Self-pay

## 2017-08-23 ENCOUNTER — Encounter: Payer: Self-pay | Admitting: Internal Medicine

## 2017-08-23 DIAGNOSIS — I480 Paroxysmal atrial fibrillation: Secondary | ICD-10-CM | POA: Insufficient documentation

## 2017-08-23 MED ORDER — ROSUVASTATIN CALCIUM 10 MG PO TABS: 10.0000 mg | ORAL_TABLET | Freq: Every day | ORAL | 3 refills | Status: DC

## 2017-08-23 NOTE — Telephone Encounter (Signed)
Fine to refill. Thanks.  Nelva Bush, MD [facility] Pager: [phone]

## 2017-08-23 NOTE — Telephone Encounter (Signed)
LPM regarding her Crestor refill.

## 2017-08-24 ENCOUNTER — Telehealth: Payer: Self-pay | Admitting: Internal Medicine

## 2017-08-24 NOTE — Telephone Encounter (Signed)
Outpatient Medication Detail    Disp Refills Start End   metoprolol succinate (TOPROL-XL) 50 MG 24 hr tablet 90 tablet 3 08/22/2017    Sig - Route: Take 1 tablet (50 mg total) by mouth daily. Take with or immediately following a meal. - Oral   Sent to pharmacy as: metoprolol succinate (TOPROL-XL) 50 MG 24 hr tablet   E-Prescribing Status: Receipt confirmed by pharmacy (08/22/2017 1:36 PM EDT)   Associated Diagnoses   Nonrheumatic aortic valve stenosis     Hyperlipidemia LDL goal <70     Pharmacy   [id number] - [place], [place] - [address]

## 2017-08-24 NOTE — Telephone Encounter (Signed)
Rx was sent to pharmacy as below. I called patient but did not get an answer and vm was unidentified.

## 2017-08-24 NOTE — Telephone Encounter (Signed)
°*  STAT* If patient is at the pharmacy, call can be transferred to refill team.   1. Which medications need to be refilled? (please list name of each medication and dose if known)  metoprolol succinate (TOPROL-XL) 50 MG 24 hr tablet Take 1 tablet (50 mg total) by mouth daily. Take with or immediately following a meal.   2. Which pharmacy/location (including street and city if local pharmacy) is medication to be sent to?  [id number] - [place], [place] [phone] (Phone) [phone] (Fax)   3. Do they need a 30 day or 90 day supply? [place]

## 2017-08-31 ENCOUNTER — Ambulatory Visit: Payer: Medicare HMO

## 2017-09-02 ENCOUNTER — Ambulatory Visit (INDEPENDENT_AMBULATORY_CARE_PROVIDER_SITE_OTHER): Payer: Medicare HMO

## 2017-09-02 DIAGNOSIS — M810 Age-related osteoporosis without current pathological fracture: Secondary | ICD-10-CM | POA: Diagnosis not present

## 2017-09-02 MED ORDER — DENOSUMAB 60 MG/ML ~~LOC~~ SOSY
60.0000 mg | PREFILLED_SYRINGE | Freq: Once | SUBCUTANEOUS | Status: AC
  Administered 2017-09-02: 60 mg via SUBCUTANEOUS

## 2017-09-02 NOTE — Progress Notes (Signed)
Medical screening examination/treatment/procedure(s) were performed by non-physician practitioner and as supervising physician I was immediately available for consultation/collaboration. I agree with above. Baeleigh Devincent, MD   

## 2017-09-16 ENCOUNTER — Encounter: Payer: Self-pay | Admitting: Internal Medicine

## 2017-09-16 ENCOUNTER — Ambulatory Visit: Payer: Medicare HMO | Admitting: Family

## 2017-09-16 ENCOUNTER — Ambulatory Visit (INDEPENDENT_AMBULATORY_CARE_PROVIDER_SITE_OTHER): Payer: Medicare HMO | Admitting: Internal Medicine

## 2017-09-16 DIAGNOSIS — R21 Rash and other nonspecific skin eruption: Secondary | ICD-10-CM | POA: Diagnosis not present

## 2017-09-16 MED ORDER — TRIAMCINOLONE ACETONIDE 0.1 % EX CREA: 1.0000 "application " | TOPICAL_CREAM | Freq: Two times a day (BID) | CUTANEOUS | 0 refills | Status: DC

## 2017-09-16 NOTE — Assessment & Plan Note (Signed)
Rx for triamcinolone cream for the rash. Suspect some of the redness is from itching. Advised oral benadryl or gel/ointment benadryl.

## 2017-09-16 NOTE — Patient Instructions (Signed)
We have sent in a cream to use on the rash twice a day for 1 week or so.   It is okay to use benadryl gel/ointment on the itchy spots to help you not itch.

## 2017-09-16 NOTE — Progress Notes (Signed)
   Subjective:    Patient ID: Carrie [name], female    DOB: Nov 28, 1945, 72 y.o.   MRN: [id number]  HPI The patient is a 72 YO female coming in for rash. Started about 5 days ago on her face and left side. She was not outside. Denies new soaps, lotions, shampoo, detergent. She states they feel itchy and some burning. Overall stable since onset. Has put makeup over her face today because she had to be out with friends. She is trying not to itch the areas. They are not spreading, no pain associated with them.   Review of Systems  Constitutional: Negative.   Respiratory: Negative for cough, chest tightness and shortness of breath.   Cardiovascular: Negative for chest pain, palpitations and leg swelling.  Gastrointestinal: Negative for abdominal distention, abdominal pain, constipation, diarrhea, nausea and vomiting.  Musculoskeletal: Negative.   Skin: Positive for rash.  Neurological: Negative.       Objective:   Physical Exam  Constitutional: She is oriented to person, place, and time. She appears well-developed and well-nourished.  HENT:  Head: Normocephalic and atraumatic.  Eyes: EOM are normal.  Neck: Normal range of motion.  Cardiovascular: Normal rate and regular rhythm.  Pulmonary/Chest: Effort normal and breath sounds normal. No respiratory distress. She has no wheezes. She has no rales.  Abdominal: Soft. Bowel sounds are normal. She exhibits no distension. There is no tenderness. There is no rebound.  Musculoskeletal: She exhibits no edema.  Neurological: She is alert and oriented to person, place, and time. Coordination normal.  Skin: Skin is warm and dry. Rash noted.  Rash on the left flank without blisters or macular lesions, no satellite lesions.   Psychiatric: She has a normal mood and affect.   Vitals:   09/16/17 1447  BP: 100/60  Pulse: 73  Temp: 98.1 F (36.7 C)  TempSrc: Oral  SpO2: 96%  Weight: 129 lb (58.5 kg)  Height: 5\' 2"  (1.575 m)      Assessment &  Plan:

## 2017-09-27 ENCOUNTER — Telehealth: Payer: Self-pay | Admitting: Emergency Medicine

## 2017-09-27 DIAGNOSIS — L249 Irritant contact dermatitis, unspecified cause: Secondary | ICD-10-CM | POA: Diagnosis not present

## 2017-09-27 NOTE — Telephone Encounter (Signed)
Called patient to schedule AWV. Patient will call back at a later date to schedule.

## 2017-10-07 ENCOUNTER — Other Ambulatory Visit: Payer: Self-pay | Admitting: Internal Medicine

## 2017-10-07 DIAGNOSIS — I4892 Unspecified atrial flutter: Secondary | ICD-10-CM

## 2017-10-07 NOTE — Telephone Encounter (Signed)
Please review for refill, Thanks !  

## 2017-10-10 DIAGNOSIS — H2513 Age-related nuclear cataract, bilateral: Secondary | ICD-10-CM | POA: Diagnosis not present

## 2017-10-10 DIAGNOSIS — H524 Presbyopia: Secondary | ICD-10-CM | POA: Diagnosis not present

## 2017-10-10 DIAGNOSIS — H25013 Cortical age-related cataract, bilateral: Secondary | ICD-10-CM | POA: Diagnosis not present

## 2017-10-19 DIAGNOSIS — H2513 Age-related nuclear cataract, bilateral: Secondary | ICD-10-CM | POA: Diagnosis not present

## 2017-10-19 DIAGNOSIS — H25013 Cortical age-related cataract, bilateral: Secondary | ICD-10-CM | POA: Diagnosis not present

## 2017-11-02 DIAGNOSIS — H25011 Cortical age-related cataract, right eye: Secondary | ICD-10-CM | POA: Diagnosis not present

## 2017-11-02 DIAGNOSIS — H25012 Cortical age-related cataract, left eye: Secondary | ICD-10-CM | POA: Diagnosis not present

## 2017-11-02 DIAGNOSIS — H2511 Age-related nuclear cataract, right eye: Secondary | ICD-10-CM | POA: Diagnosis not present

## 2017-11-02 DIAGNOSIS — H2512 Age-related nuclear cataract, left eye: Secondary | ICD-10-CM | POA: Diagnosis not present

## 2017-11-11 ENCOUNTER — Encounter: Payer: Self-pay | Admitting: Family

## 2017-11-11 ENCOUNTER — Ambulatory Visit (INDEPENDENT_AMBULATORY_CARE_PROVIDER_SITE_OTHER): Payer: Medicare HMO | Admitting: Family

## 2017-11-11 VITALS — BP 110/70 | HR 67 | Temp 97.3°F | Ht 62.0 in | Wt 130.1 lb

## 2017-11-11 DIAGNOSIS — L509 Urticaria, unspecified: Secondary | ICD-10-CM

## 2017-11-11 MED ORDER — PREDNISONE 10 MG PO TABS
ORAL_TABLET | ORAL | 0 refills | Status: DC
Start: 2017-11-11 — End: 2017-11-25

## 2017-11-11 NOTE — Progress Notes (Signed)
Carrie [name] is a 72 y.o. female with the following history as recorded in EpicCare:  Patient Active Problem List   Diagnosis Date Noted  . Rash 09/16/2017  . Paroxysmal atrial fibrillation ([place]) 08/23/2017  . Upper respiratory tract infection 06/24/2017  . Rectal bleeding 05/05/2017  . Pneumonia of left lower lobe due to infectious organism ([place]) 05/04/2017  . Positive colorectal cancer screening using Cologuard test 02/24/2017  . Coronary artery disease of native artery of native heart with stable angina pectoris ([place]) 02/24/2017  . Pure hypercholesterolemia 02/24/2017  . Hypertrophic cardiomyopathy ([place]) 11/22/2016  . Elevated LFTs 08/16/2016  . Atrial flutter ([place]) 07/08/2016  . Varicose veins of lower extremities with other complications 79/89/2119  . Allergic rhinitis 12/28/2012  . OAB (overactive bladder) 06/22/2012  . Routine general medical examination at a health care facility 06/22/2012  . Nonrheumatic aortic valve stenosis 09/05/2010  . Coronary artery disease due to lipid rich plaque 06/26/2009  . SUPRAVENTRICULAR TACHYCARDIA 06/23/2009  . Hyperlipidemia LDL goal <70 03/06/2009  . Osteoporosis 03/06/2009    Current Outpatient Medications  Medication Sig Dispense Refill  . CALCIUM PO Take 1 tablet by mouth daily.     . Cholecalciferol (VITAMIN D3) 1000 UNITS CAPS Take 1 capsule by mouth daily.      . Difluprednate 0.05 % EMUL Instil 1 drop in operative eye(s) BID for 3 weeks following surgery    . DUREZOL 0.05 % EMUL INSTIL 1 DROP IN OPERATIVE EYE(S) TWICE DAILY FOR 3 WEEKS FOLLOWING SURGERY  1  . ELIQUIS 5 MG TABS tablet TAKE 1 TABLET BY MOUTH TWICE A DAY 60 tablet 6  . fish oil-omega-3 fatty acids 1000 MG capsule Take 1 g by mouth daily.      . metoprolol succinate (TOPROL-XL) 50 MG 24 hr tablet Take 1 tablet (50 mg total) by mouth daily. Take with or immediately following a meal. 90 tablet 3  . nitroGLYCERIN (NITROLINGUAL) 0.4 MG/SPRAY spray Place 1 spray under  the tongue 3 times/day as needed-between meals & bedtime.    Marland Kitchen ofloxacin (OCUFLOX) 0.3 % ophthalmic solution Instill one drop BID in operative eye(s) starting 2 days prior to surgery and 3 weeks following surgery    . rosuvastatin (CRESTOR) 10 MG tablet Take 1 tablet (10 mg total) by mouth daily. 90 tablet 3  . Thiamine HCl (VITAMIN B-1) 250 MG tablet Take 500 mg by mouth daily.      Marland Kitchen triamcinolone cream (KENALOG) 0.1 % Apply 1 application topically 2 (two) times daily. 30 g 0  . predniSONE (DELTASONE) 10 MG tablet 3 tablets per day x 3 days; then 2 tablets per day x 3 days; then 1 tablet per day x 3 day; 18 tablet 0   No current facility-administered medications for this visit.     Allergies: Codeine; Crestor [rosuvastatin]; and Latex  Past Medical History:  Diagnosis Date  . Aortic stenosis   . Coronary artery disease    a. Prior LHC (07/24/03): LMCA heavily calcified without significant stenosis, LAD with heavy calcification proximally. 50-60% stenosis just distal to D2, LCx calcified proximally without significant stenosis, Dominant RCA with moderate proximal calcification and mild luminal irregularities throughout, LVEDP 12, LVEF 70%  . Diastolic dysfunction without heart failure   . Former tobacco use   . Former tobacco use   . HOCM (hypertrophic obstructive cardiomyopathy) ([place])   . Mild AI (aortic insufficiency)   . Osteoporosis, unspecified   . Other and unspecified hyperlipidemia   . Paroxysmal atrial  flutter ([place])   . Pneumonia   . Wears hearing aid    left    Past Surgical History:  Procedure Laterality Date  . ATRIAL ABLATION SURGERY  2005  . BREAST SURGERY  1982   Benign tumor removed RT  . BUNIONECTOMY  04/07/2012   Procedure: Lillard Anes;  Surgeon: Alta Corning, MD;  Location: [facility];  Service: Orthopedics;  Laterality: Left;  CHEVRON OSTEOTOMY LEFT FOOT   . CARDIAC CATHETERIZATION  2005  . GANGLION CYST EXCISION     left wrist  . HERNIA  REPAIR  1990   rt ing  . TONSILLECTOMY    . TUBAL LIGATION      Family History  Problem Relation Age of Onset  . Heart disease Mother 31  . Congestive Heart Failure Mother   . Dementia Mother   . Hypertension Other   . Valvular heart disease Father        Had valves replaced  . Colon cancer Neg Hx   . Rectal cancer Neg Hx     Social History   Tobacco Use  . Smoking status: Former Smoker    Last attempt to quit: 04/05/1999    Years since quitting: 18.6  . Smokeless tobacco: Never Used  . Tobacco comment: Regular Exercise-No  Substance Use Topics  . Alcohol use: Yes    Alcohol/week: 4.2 oz    Types: 7 Glasses of wine per week    Comment: 1 drink per week    Subjective:  Patient presents with concerns for episodes of "recurrent rashes" on her neck/ chest and arms; symptoms have ben present "on and off" x 2 months; was seen at the end of May with similar symptoms- responded well to topical treatment given at that time but seems to have re-flared in the past week; denies any new soaps, foods, detergents or medications. Describes rash as "very itchy" Admits stress level higher recently;    Objective:  Vitals:   11/11/17 1342  BP: 110/70  Pulse: 67  Temp: (!) 97.3 F (36.3 C)  TempSrc: Oral  SpO2: 97%  Weight: 130 lb 1.3 oz (59 kg)  Height: 5\' 2"  (1.575 m)    General: Well developed, well nourished, in no acute distress  Skin : Warm and dry. No symptoms seen today but description c/w urticaria Head: Normocephalic and atraumatic  Eyes: Sclera and conjunctiva clear; pupils round and reactive to light; extraocular movements intact  Ears: External normal; canals clear; tympanic membranes normal  Oropharynx: Pink, supple. No suspicious lesions  Neck: Supple without thyromegaly, adenopathy  Lungs: Respirations unlabored; clear to auscultation bilaterally without wheeze, rales, rhonchi  CVS exam: normal rate and regular rhythm.  Neurologic: Alert and oriented; speech intact;  face symmetrical; moves all extremities well; CNII-XII intact without focal deficit   Assessment:  1. Urticaria     Plan:  Explained that difficult to determine source of symptoms- may need to refer to an allergist; Rx for Prednisone 10 mg- taper as directed; use Zantac and Claritin; follow-up if symptoms persist.   No follow-ups on file.  No orders of the defined types were placed in this encounter.   Requested Prescriptions   Signed Prescriptions Disp Refills  . predniSONE (DELTASONE) 10 MG tablet 18 tablet 0    Sig: 3 tablets per day x 3 days; then 2 tablets per day x 3 days; then 1 tablet per day x 3 day;

## 2017-11-11 NOTE — Patient Instructions (Signed)
For the antihistamine:  Please pick up Claritin 10 mg ( Loratidine) and Zantac 75 mg; take the Zantac twice a day and Claritin 10 mg;    Hives Hives (urticaria) are itchy, red, swollen areas on your skin. Hives can show up on any part of your body, and they can vary in size. They can be as small as the tip of a pen or much larger. Hives often fade within 24 hours (acute hives). In other cases, new hives show up after old ones fade. This can continue for many days or weeks (chronic hives). Hives are caused by your body's reaction to an irritant or to something that you are allergic to (trigger). You can get hives right after being around a trigger or hours later. Hives do not spread from person to person (are not contagious). Hives may get worse if you scratch them, if you exercise, or if you have worries (emotional stress). Follow these instructions at home: Medicines  Take or apply over-the-counter and prescription medicines only as told by your doctor.  If you were prescribed an antibiotic medicine, use it as told by your doctor. Do not stop taking the antibiotic even if you start to feel better. Skin Care  Apply cool, wet cloths (cool compresses) to the itchy, red, swollen areas.  Do not scratch your skin. Do not rub your skin. General instructions  Do not take hot showers or baths. This can make itching worse.  Do not wear tight clothes.  Use sunscreen and wear clothing that covers your skin when you are outside.  Avoid any triggers that cause your hives. Keep a journal to help you keep track of what causes your hives. Write down: ? What medicines you take. ? What you eat and drink. ? What products you use on your skin.  Keep all follow-up visits as told by your doctor. This is important. Contact a doctor if:  Your symptoms are not better with medicine.  Your joints are painful or swollen. Get help right away if:  You have a fever.  You have belly pain.  Your tongue or  lips are swollen.  Your eyelids are swollen.  Your chest or throat feels tight.  You have trouble breathing or swallowing. These symptoms may be an emergency. Do not wait to see if the symptoms will go away. Get medical help right away. Call your local emergency services (911 in the [place] Do not drive yourself to the hospital. This information is not intended to replace advice given to you by your health care provider. Make sure you discuss any questions you have with your health care provider. Document Released: 01/20/2008 Document Revised: 09/18/2015 Document Reviewed: 01/29/2015 Elsevier Interactive Patient Education  2018 [employer].

## 2017-11-25 ENCOUNTER — Ambulatory Visit (INDEPENDENT_AMBULATORY_CARE_PROVIDER_SITE_OTHER): Payer: Medicare HMO | Admitting: Family

## 2017-11-25 ENCOUNTER — Encounter: Payer: Self-pay | Admitting: Family

## 2017-11-25 VITALS — BP 108/70 | HR 69 | Temp 97.5°F | Ht 62.0 in | Wt 130.0 lb

## 2017-11-25 DIAGNOSIS — R350 Frequency of micturition: Secondary | ICD-10-CM | POA: Diagnosis not present

## 2017-11-25 DIAGNOSIS — R3911 Hesitancy of micturition: Secondary | ICD-10-CM

## 2017-11-25 MED ORDER — SULFAMETHOXAZOLE-TRIMETHOPRIM 800-160 MG PO TABS: 1.0000 | ORAL_TABLET | Freq: Two times a day (BID) | ORAL | 0 refills | Status: DC

## 2017-11-25 NOTE — Progress Notes (Signed)
Carrie [name] is a 72 y.o. female with the following history as recorded in EpicCare:  Patient Active Problem List   Diagnosis Date Noted  . Rash 09/16/2017  . Paroxysmal atrial fibrillation ([place]) 08/23/2017  . Upper respiratory tract infection 06/24/2017  . Rectal bleeding 05/05/2017  . Pneumonia of left lower lobe due to infectious organism ([place]) 05/04/2017  . Positive colorectal cancer screening using Cologuard test 02/24/2017  . Coronary artery disease of native artery of native heart with stable angina pectoris ([place]) 02/24/2017  . Pure hypercholesterolemia 02/24/2017  . Hypertrophic cardiomyopathy ([place]) 11/22/2016  . Elevated LFTs 08/16/2016  . Atrial flutter ([place]) 07/08/2016  . Varicose veins of lower extremities with other complications 08/65/7846  . Allergic rhinitis 12/28/2012  . OAB (overactive bladder) 06/22/2012  . Routine general medical examination at a health care facility 06/22/2012  . Nonrheumatic aortic valve stenosis 09/05/2010  . Coronary artery disease due to lipid rich plaque 06/26/2009  . SUPRAVENTRICULAR TACHYCARDIA 06/23/2009  . Hyperlipidemia LDL goal <70 03/06/2009  . Osteoporosis 03/06/2009    Current Outpatient Medications  Medication Sig Dispense Refill  . CALCIUM PO Take 1 tablet by mouth daily.     . Cholecalciferol (VITAMIN D3) 1000 UNITS CAPS Take 1 capsule by mouth daily.      . Difluprednate 0.05 % EMUL Instil 1 drop in operative eye(s) BID for 3 weeks following surgery    . DUREZOL 0.05 % EMUL INSTIL 1 DROP IN OPERATIVE EYE(S) TWICE DAILY FOR 3 WEEKS FOLLOWING SURGERY  1  . ELIQUIS 5 MG TABS tablet TAKE 1 TABLET BY MOUTH TWICE A DAY 60 tablet 6  . fish oil-omega-3 fatty acids 1000 MG capsule Take 1 g by mouth daily.      . metoprolol succinate (TOPROL-XL) 50 MG 24 hr tablet Take 1 tablet (50 mg total) by mouth daily. Take with or immediately following a meal. 90 tablet 3  . nitroGLYCERIN (NITROLINGUAL) 0.4 MG/SPRAY spray Place 1 spray under  the tongue 3 times/day as needed-between meals & bedtime.    Marland Kitchen ofloxacin (OCUFLOX) 0.3 % ophthalmic solution Instill one drop BID in operative eye(s) starting 2 days prior to surgery and 3 weeks following surgery    . rosuvastatin (CRESTOR) 10 MG tablet Take 1 tablet (10 mg total) by mouth daily. 90 tablet 3  . Thiamine HCl (VITAMIN B-1) 250 MG tablet Take 500 mg by mouth daily.      Marland Kitchen triamcinolone cream (KENALOG) 0.1 % Apply 1 application topically 2 (two) times daily. 30 g 0  . vitamin B-12 (CYANOCOBALAMIN) 1000 MCG tablet Take 1,000 mcg by mouth daily.    Marland Kitchen sulfamethoxazole-trimethoprim (BACTRIM DS,SEPTRA DS) 800-160 MG tablet Take 1 tablet by mouth 2 (two) times daily. 10 tablet 0   No current facility-administered medications for this visit.     Allergies: Codeine; Crestor [rosuvastatin]; and Latex  Past Medical History:  Diagnosis Date  . Aortic stenosis   . Coronary artery disease    a. Prior LHC (07/24/03): LMCA heavily calcified without significant stenosis, LAD with heavy calcification proximally. 50-60% stenosis just distal to D2, LCx calcified proximally without significant stenosis, Dominant RCA with moderate proximal calcification and mild luminal irregularities throughout, LVEDP 12, LVEF 70%  . Diastolic dysfunction without heart failure   . Former tobacco use   . Former tobacco use   . HOCM (hypertrophic obstructive cardiomyopathy) ([place])   . Mild AI (aortic insufficiency)   . Osteoporosis, unspecified   . Other and unspecified hyperlipidemia   .  Paroxysmal atrial flutter ([place])   . Pneumonia   . Wears hearing aid    left    Past Surgical History:  Procedure Laterality Date  . ATRIAL ABLATION SURGERY  2005  . BREAST SURGERY  1982   Benign tumor removed RT  . BUNIONECTOMY  04/07/2012   Procedure: Lillard Anes;  Surgeon: Alta Corning, MD;  Location: [facility];  Service: Orthopedics;  Laterality: Left;  CHEVRON OSTEOTOMY LEFT FOOT   . CARDIAC  CATHETERIZATION  2005  . GANGLION CYST EXCISION     left wrist  . HERNIA REPAIR  1990   rt ing  . TONSILLECTOMY    . TUBAL LIGATION      Family History  Problem Relation Age of Onset  . Heart disease Mother 67  . Congestive Heart Failure Mother   . Dementia Mother   . Hypertension Other   . Valvular heart disease Father        Had valves replaced  . Colon cancer Neg Hx   . Rectal cancer Neg Hx     Social History   Tobacco Use  . Smoking status: Former Smoker    Last attempt to quit: 04/05/1999    Years since quitting: 18.6  . Smokeless tobacco: Never Used  . Tobacco comment: Regular Exercise-No  Substance Use Topics  . Alcohol use: Yes    Alcohol/week: 4.2 oz    Types: 7 Glasses of wine per week    Comment: 1 drink per week    Subjective: Patient presents with concerns for possible UTI; complaining of urinary frequency x 24 hours; sense of incomplete emptying; + urgency; no blood in the urine; unfortunately she is not able to give a urine sample today.    Objective:  Vitals:   11/25/17 1452  BP: 108/70  Pulse: 69  Temp: (!) 97.5 F (36.4 C)  TempSrc: Oral  SpO2: 97%  Weight: 130 lb (59 kg)  Height: 5\' 2"  (1.575 m)    General: Well developed, well nourished, in no acute distress  Skin : Warm and dry.  Lungs: Respirations unlabored;  Abdomen: Soft; nontender; nondistended; normoactive bowel sounds; no masses or hepatosplenomegaly  Musculoskeletal: No deformities; no active joint inflammation; negative CVA tenderness Extremities: No edema, cyanosis, clubbing  Vessels: Symmetric bilaterally  Neurologic: Alert and oriented; speech intact; face symmetrical; moves all extremities well; CNII-XII intact without focal deficit   Assessment:  1. Urinary frequency   2. Urinary hesitancy     Plan:  Unable to give sample in office today; will treat based on symptoms; Rx for Bactrim DS bid x 5 days; follow-up symptoms worse, no better.     No follow-ups on file.   No orders of the defined types were placed in this encounter.   Requested Prescriptions   Signed Prescriptions Disp Refills  . sulfamethoxazole-trimethoprim (BACTRIM DS,SEPTRA DS) 800-160 MG tablet 10 tablet 0    Sig: Take 1 tablet by mouth 2 (two) times daily.

## 2017-12-06 DIAGNOSIS — R69 Illness, unspecified: Secondary | ICD-10-CM | POA: Diagnosis not present

## 2017-12-19 IMAGING — US US ABDOMEN COMPLETE
1 series · 14 of 25 positions shown · non-contrast
Comparison: None.

CLINICAL DATA: Elevated LFTs

EXAM:
ABDOMEN ULTRASOUND COMPLETE

[Series 1: us abdomen complete · 0.15mm/px · 14 of 73 slices shown]
[im 1/73]
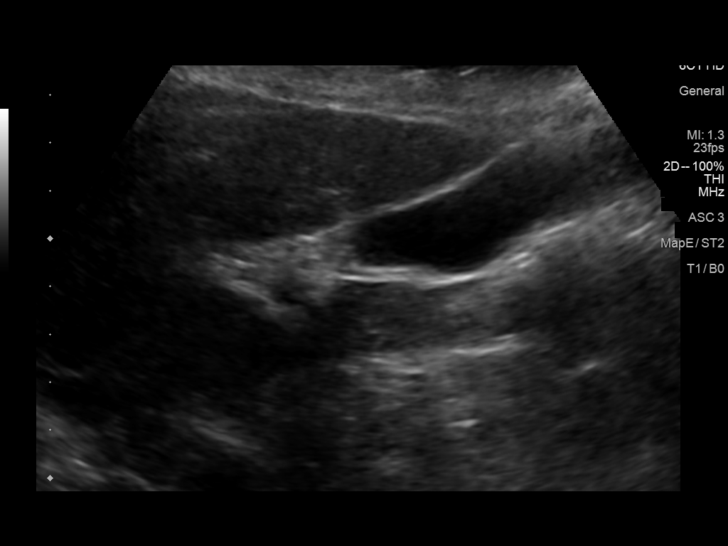
[im 7/73]
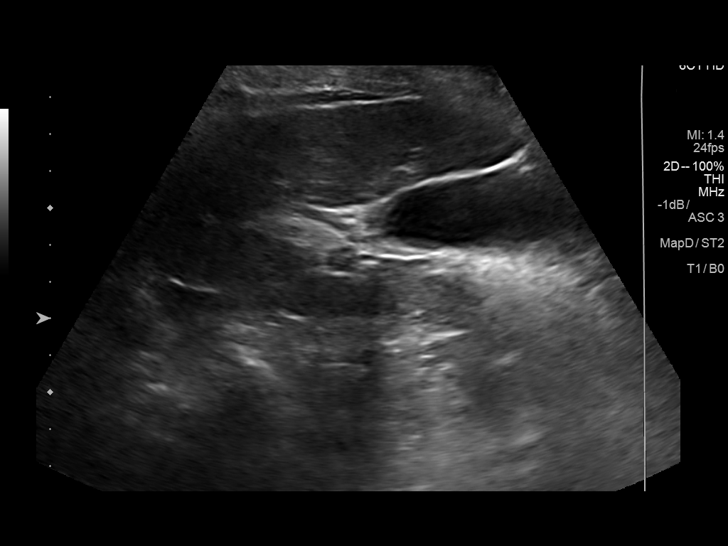
[im 13/73]
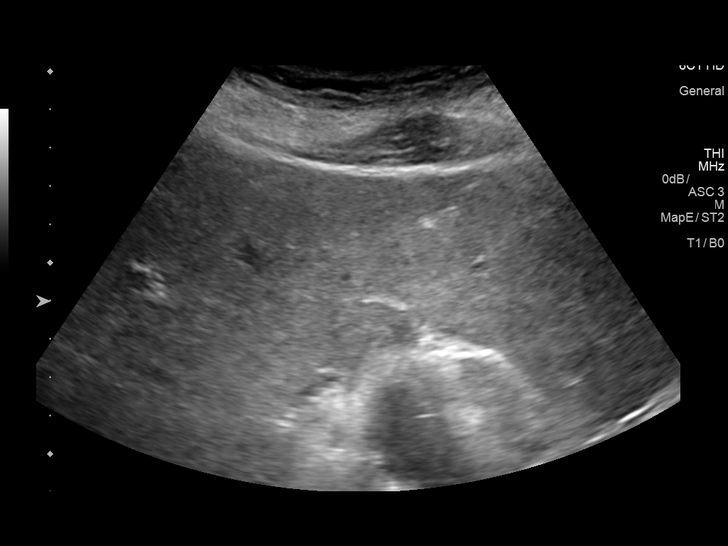
[im 19/73]
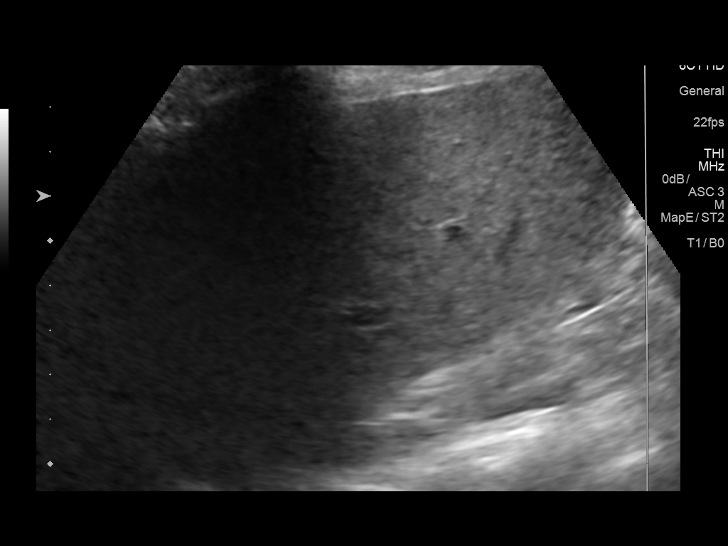
[im 25/73]
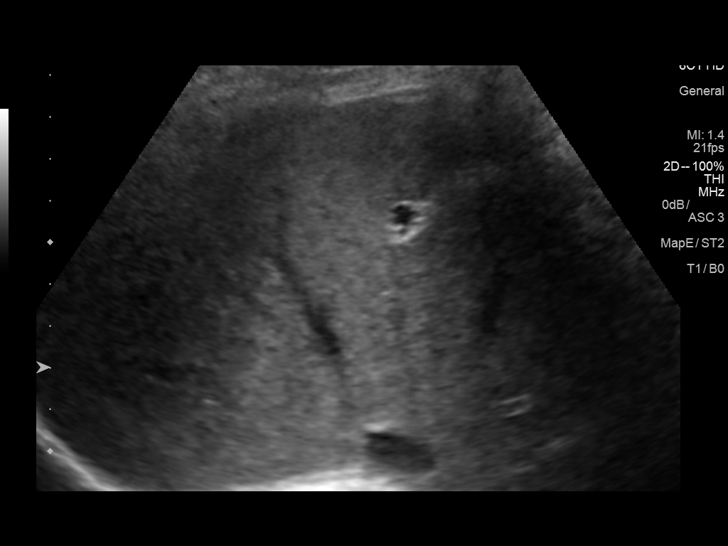
[im 28/73]
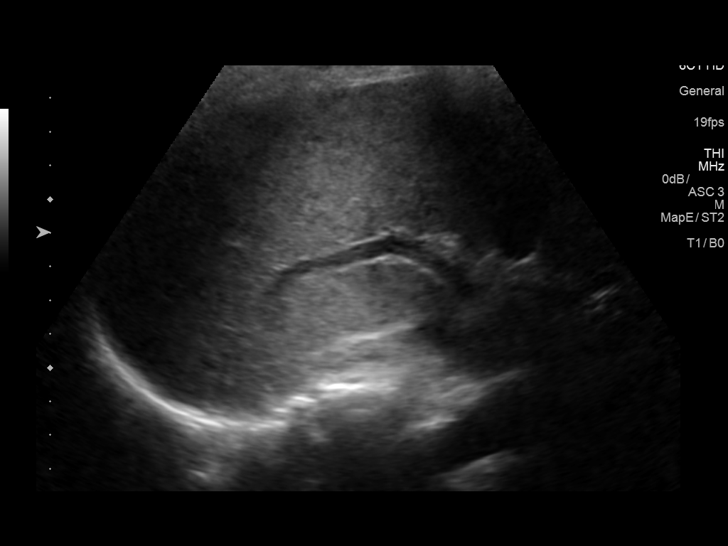
[im 34/73]
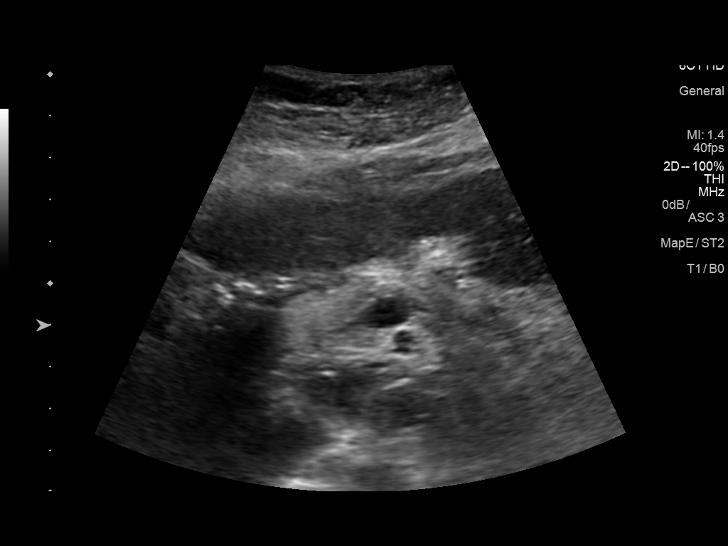
[im 40/73]
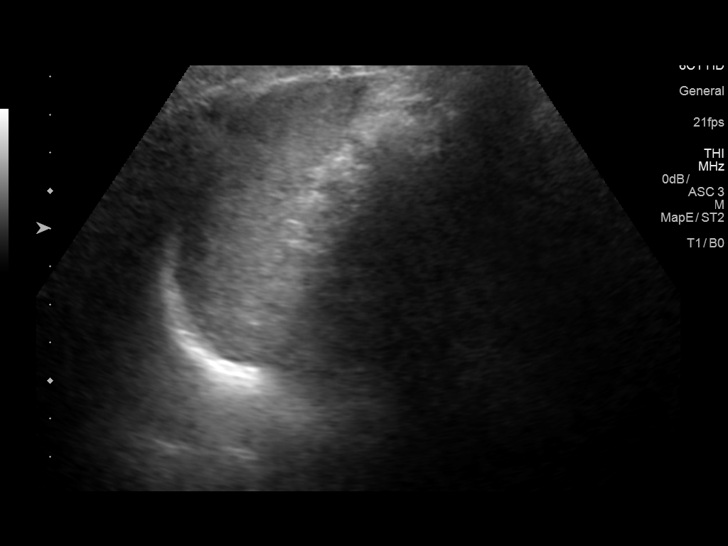
[im 46/73]
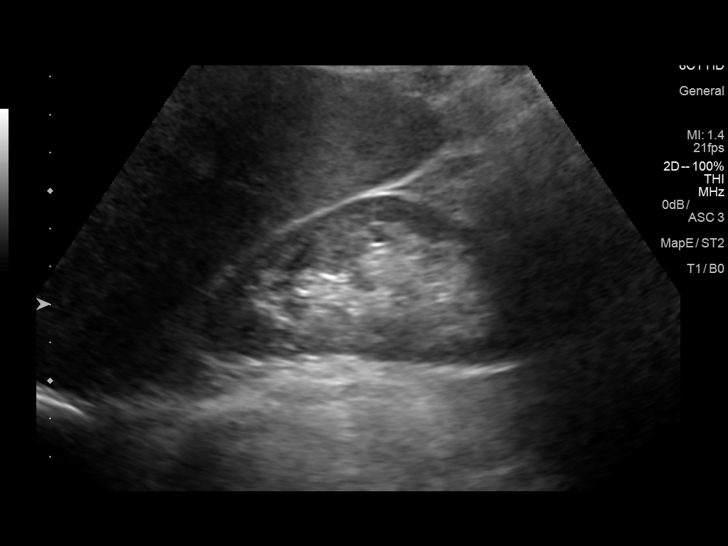
[im 49/73]
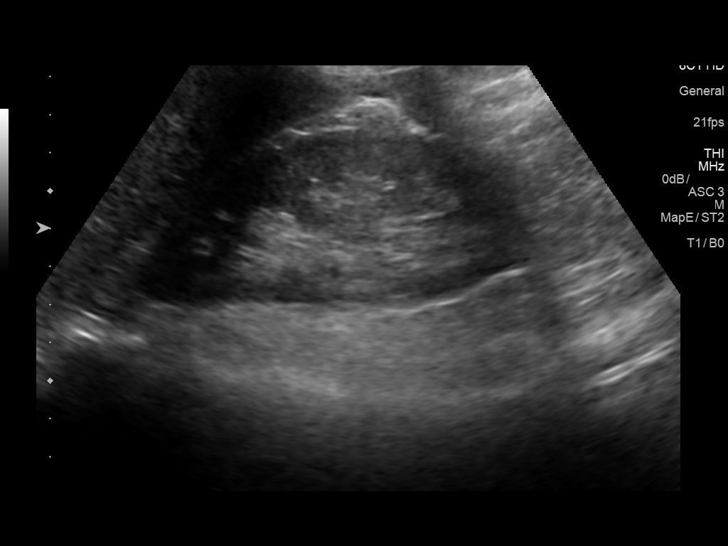
[im 55/73]
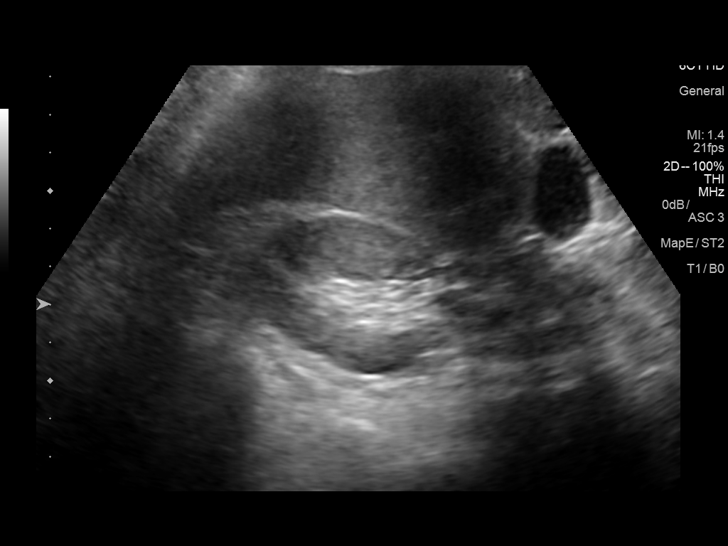
[im 61/73]
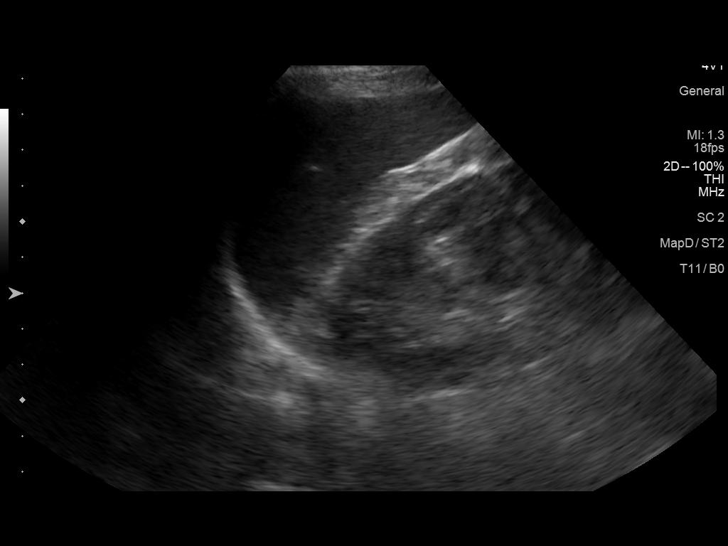
[im 67/73]
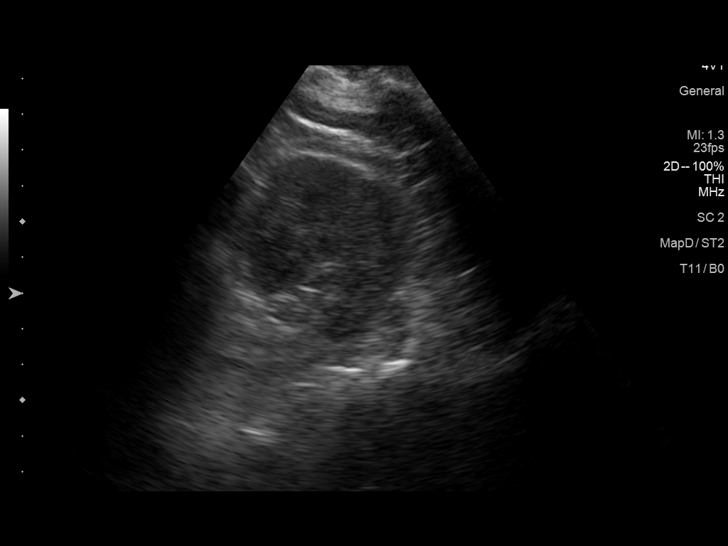
[im 73/73]
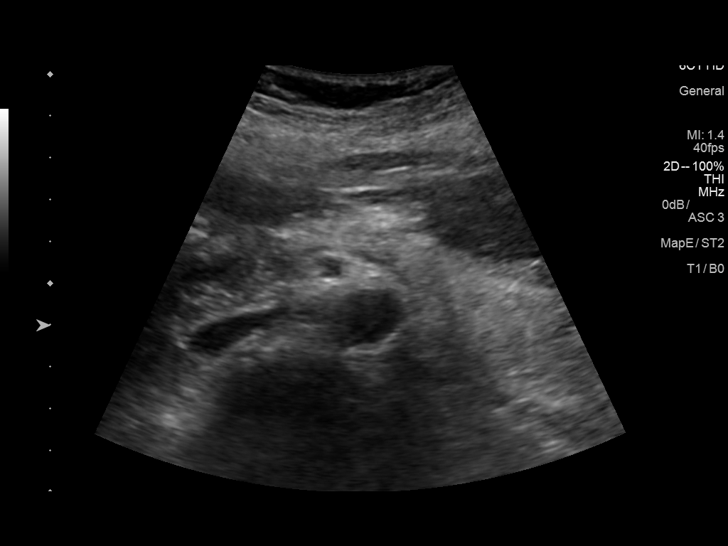

[14 of 25 positions shown; findings below may reference images not displayed]

FINDINGS: Gallbladder: No gallstones or wall thickening visualized. No
sonographic Murphy sign noted by sonographer.

Common bile duct: Diameter: Normal caliber, 2-3 mm

Liver: Increased echotexture compatible with fatty infiltration. No
focal abnormality or biliary ductal dilatation.

IVC: Visualized portions normal.

Pancreas: Visualized portion unremarkable.

Spleen: Size and appearance within normal limits.

Right Kidney: Length: 10.8 cm. Echogenicity within normal limits. No
mass or hydronephrosis visualized.

Left Kidney: Length: 10.9 cm. Echogenicity within normal limits. No
mass or hydronephrosis visualized.

Abdominal aorta: No aneurysm visualized.

Other findings: None.
IMPRESSION: Fatty liver.

No acute findings.

## 2017-12-22 ENCOUNTER — Telehealth: Payer: Self-pay | Admitting: Internal Medicine

## 2017-12-22 NOTE — Telephone Encounter (Signed)
° °  Patient made aware Dr End will be at [facility] only, starting Jan 2020

## 2018-01-03 ENCOUNTER — Ambulatory Visit (INDEPENDENT_AMBULATORY_CARE_PROVIDER_SITE_OTHER): Payer: Medicare HMO | Admitting: Family

## 2018-01-03 ENCOUNTER — Encounter: Payer: Self-pay | Admitting: Family

## 2018-01-03 ENCOUNTER — Other Ambulatory Visit (INDEPENDENT_AMBULATORY_CARE_PROVIDER_SITE_OTHER): Payer: Medicare HMO

## 2018-01-03 VITALS — BP 114/72 | HR 79 | Temp 97.8°F | Ht 62.0 in | Wt 129.8 lb

## 2018-01-03 DIAGNOSIS — R109 Unspecified abdominal pain: Secondary | ICD-10-CM

## 2018-01-03 DIAGNOSIS — R140 Abdominal distension (gaseous): Secondary | ICD-10-CM | POA: Diagnosis not present

## 2018-01-03 DIAGNOSIS — Z23 Encounter for immunization: Secondary | ICD-10-CM | POA: Diagnosis not present

## 2018-01-03 LAB — COMPREHENSIVE METABOLIC PANEL
ALK PHOS: 54 U/L (ref 39–117)
ALT: 13 U/L (ref 0–35)
AST: 19 U/L (ref 0–37)
Albumin: 4.4 g/dL (ref 3.5–5.2)
BUN: 14 mg/dL (ref 6–23)
CO2: 28 meq/L (ref 19–32)
Calcium: 9.8 mg/dL (ref 8.4–10.5)
Chloride: 103 mEq/L (ref 96–112)
Creatinine, Ser: 0.63 mg/dL (ref 0.40–1.20)
GFR: 98.54 mL/min (ref >60.00)
GLUCOSE: 86 mg/dL (ref 70–99)
POTASSIUM: 4.3 meq/L (ref 3.5–5.1)
Sodium: 139 mEq/L (ref 135–145)
Total Bilirubin: 0.7 mg/dL (ref 0.2–1.2)
Total Protein: 8.3 g/dL (ref 6.0–8.3)

## 2018-01-03 LAB — CBC WITH DIFFERENTIAL/PLATELET
Basophils Absolute: 0.1 10*3/uL (ref 0.0–0.1)
Basophils Relative: 0.9 % (ref 0.0–3.0)
EOS PCT: 0.6 % (ref 0.0–5.0)
Eosinophils Absolute: 0 10*3/uL (ref 0.0–0.7)
HCT: 42.6 % (ref 36.0–46.0)
Hemoglobin: 14.6 g/dL (ref 12.0–15.0)
LYMPHS ABS: 2.3 10*3/uL (ref 0.7–4.0)
Lymphocytes Relative: 29.6 % (ref 12.0–46.0)
MCHC: 34.2 g/dL (ref 30.0–36.0)
MCV: 91.9 fl (ref 78.0–100.0)
MONOS PCT: 9 % (ref 3.0–12.0)
Monocytes Absolute: 0.7 10*3/uL (ref 0.1–1.0)
NEUTROS PCT: 59.9 % (ref 43.0–77.0)
Neutro Abs: 4.8 10*3/uL (ref 1.4–7.7)
Platelets: 344 10*3/uL (ref 150.0–400.0)
RBC: 4.64 Mil/uL (ref 3.87–5.11)
RDW: 12.9 % (ref 11.5–15.5)
WBC: 7.9 10*3/uL (ref 4.0–10.5)

## 2018-01-03 MED ORDER — PANTOPRAZOLE SODIUM 40 MG PO TBEC: 40.0000 mg | DELAYED_RELEASE_TABLET | Freq: Every day | ORAL | 0 refills | Status: DC

## 2018-01-03 NOTE — Progress Notes (Signed)
Carrie [name] is a 72 y.o. female with the following history as recorded in EpicCare:  Patient Active Problem List   Diagnosis Date Noted  . Rash 09/16/2017  . Paroxysmal atrial fibrillation ([place]) 08/23/2017  . Upper respiratory tract infection 06/24/2017  . Rectal bleeding 05/05/2017  . Pneumonia of left lower lobe due to infectious organism ([address]) 05/04/2017  . Positive colorectal cancer screening using Cologuard test 02/24/2017  . Coronary artery disease of native artery of native heart with stable angina pectoris ([place]) 02/24/2017  . Pure hypercholesterolemia 02/24/2017  . Hypertrophic cardiomyopathy ([place]) 11/22/2016  . Elevated LFTs 08/16/2016  . Atrial flutter ([place]) 07/08/2016  . Varicose veins of lower extremities with other complications 79/89/2119  . Allergic rhinitis 12/28/2012  . OAB (overactive bladder) 06/22/2012  . Routine general medical examination at a health care facility 06/22/2012  . Nonrheumatic aortic valve stenosis 09/05/2010  . Coronary artery disease due to lipid rich plaque 06/26/2009  . SUPRAVENTRICULAR TACHYCARDIA 06/23/2009  . Hyperlipidemia LDL goal <70 03/06/2009  . Osteoporosis 03/06/2009    Current Outpatient Medications  Medication Sig Dispense Refill  . CALCIUM PO Take 1 tablet by mouth daily.     . Cholecalciferol (VITAMIN D3) 1000 UNITS CAPS Take 1 capsule by mouth daily.      . Difluprednate 0.05 % EMUL Instil 1 drop in operative eye(s) BID for 3 weeks following surgery    . DUREZOL 0.05 % EMUL INSTIL 1 DROP IN OPERATIVE EYE(S) TWICE DAILY FOR 3 WEEKS FOLLOWING SURGERY  1  . ELIQUIS 5 MG TABS tablet TAKE 1 TABLET BY MOUTH TWICE A DAY 60 tablet 6  . fish oil-omega-3 fatty acids 1000 MG capsule Take 1 g by mouth daily.      . metoprolol succinate (TOPROL-XL) 50 MG 24 hr tablet Take 1 tablet (50 mg total) by mouth daily. Take with or immediately following a meal. 90 tablet 3  . nitroGLYCERIN (NITROLINGUAL) 0.4 MG/SPRAY spray Place 1 spray under  the tongue 3 times/day as needed-between meals & bedtime.    Marland Kitchen ofloxacin (OCUFLOX) 0.3 % ophthalmic solution Instill one drop BID in operative eye(s) starting 2 days prior to surgery and 3 weeks following surgery    . rosuvastatin (CRESTOR) 10 MG tablet Take 1 tablet (10 mg total) by mouth daily. 90 tablet 3  . sulfamethoxazole-trimethoprim (BACTRIM DS,SEPTRA DS) 800-160 MG tablet Take 1 tablet by mouth 2 (two) times daily. 10 tablet 0  . Thiamine HCl (VITAMIN B-1) 250 MG tablet Take 500 mg by mouth daily.      Marland Kitchen triamcinolone cream (KENALOG) 0.1 % Apply 1 application topically 2 (two) times daily. 30 g 0  . vitamin B-12 (CYANOCOBALAMIN) 1000 MCG tablet Take 1,000 mcg by mouth daily.    . pantoprazole (PROTONIX) 40 MG tablet Take 1 tablet (40 mg total) by mouth daily. 30 tablet 0   No current facility-administered medications for this visit.     Allergies: Codeine; Crestor [rosuvastatin]; and Latex  Past Medical History:  Diagnosis Date  . Aortic stenosis   . Coronary artery disease    a. Prior LHC (07/24/03): LMCA heavily calcified without significant stenosis, LAD with heavy calcification proximally. 50-60% stenosis just distal to D2, LCx calcified proximally without significant stenosis, Dominant RCA with moderate proximal calcification and mild luminal irregularities throughout, LVEDP 12, LVEF 70%  . Diastolic dysfunction without heart failure   . Former tobacco use   . Former tobacco use   . HOCM (hypertrophic obstructive cardiomyopathy) ([place])   .  Mild AI (aortic insufficiency)   . Osteoporosis, unspecified   . Other and unspecified hyperlipidemia   . Paroxysmal atrial flutter ([place])   . Pneumonia   . Wears hearing aid    left    Past Surgical History:  Procedure Laterality Date  . ATRIAL ABLATION SURGERY  2005  . BREAST SURGERY  1982   Benign tumor removed RT  . BUNIONECTOMY  04/07/2012   Procedure: Lillard Anes;  Surgeon: Alta Corning, MD;  Location: [facility];  Service: Orthopedics;  Laterality: Left;  CHEVRON OSTEOTOMY LEFT FOOT   . CARDIAC CATHETERIZATION  2005  . GANGLION CYST EXCISION     left wrist  . HERNIA REPAIR  1990   rt ing  . TONSILLECTOMY    . TUBAL LIGATION      Family History  Problem Relation Age of Onset  . Heart disease Mother 36  . Congestive Heart Failure Mother   . Dementia Mother   . Hypertension Other   . Valvular heart disease Father        Had valves replaced  . Colon cancer Neg Hx   . Rectal cancer Neg Hx     Social History   Tobacco Use  . Smoking status: Former Smoker    Last attempt to quit: 04/05/1999    Years since quitting: 18.7  . Smokeless tobacco: Never Used  . Tobacco comment: Regular Exercise-No  Substance Use Topics  . Alcohol use: Yes    Alcohol/week: 7.0 standard drinks    Types: 7 Glasses of wine per week    Comment: 1 drink per week    Subjective:  Patient presents with concerns for sensation of "feeling bloated" in the pelvic area/ abdominal tightness x 2 weeks; no change in appetite- feels like she never has a good appetite; no changes in bowel movement- no blood in stool; had colonoscopy done last year; no history of hysterectomy; no prior history of heartburn or reflux; in general, just "don't feel good"- I feel tired, have no energy; denies any back pain or sensation that pain is radiating from low back to pelvic area;   Objective:  Vitals:   01/03/18 1039  BP: 114/72  Pulse: 79  Temp: 97.8 F (36.6 C)  TempSrc: Oral  SpO2: 98%  Weight: 129 lb 12.8 oz (58.9 kg)  Height: 5' 2"  (1.575 m)    General: Well developed, well nourished, in no acute distress  Skin : Warm and dry.  Head: Normocephalic and atraumatic  Eyes: Sclera and conjunctiva clear; pupils round and reactive to light; extraocular movements intact  Lungs: Respirations unlabored; clear to auscultation bilaterally without wheeze, rales, rhonchi  Abdomen: Soft; nontender; nondistended; normoactive bowel  sounds; no masses or hepatosplenomegaly  Musculoskeletal: No deformities; no active joint inflammation  Extremities: No edema, cyanosis, clubbing  Vessels: Symmetric bilaterally  Neurologic: Alert and oriented; speech intact; face symmetrical; moves all extremities well; CNII-XII intact without focal deficit   Assessment:  1. Abdominal pain, unspecified abdominal location   2. Abdominal bloating   3. Need for influenza vaccination     Plan:  ? Etiology; will update abdominal and pelvic ultrasound; will give trial of Protonix to rule out GERD type symptoms; update CBC, CMP today; follow-up to be determined. If ultrasound is normal, consider updating imaging of lumbar spine and/or seeing sports medicine.  Flu shot given today;   No follow-ups on file.  Orders Placed This Encounter  Procedures  . US Abdomen Complete  Standing Status:   Future    Standing Expiration Date:   03/06/2019    Scheduling Instructions:     Schedule with pelvic ultrasound    Order Specific Question:   Reason for Exam (SYMPTOM  OR DIAGNOSIS REQUIRED)    Answer:   abdominal pain/ bloating    Order Specific Question:   Preferred imaging location?    Answer:   [facility]  . US Pelvis Complete    Standing Status:   Future    Standing Expiration Date:   03/06/2019    Scheduling Instructions:     Schedule with abdominal ultrasound    Order Specific Question:   Reason for Exam (SYMPTOM  OR DIAGNOSIS REQUIRED)    Answer:   pelvic bloating    Order Specific Question:   Preferred imaging location?    Answer:   [facility]  . Flu vaccine HIGH DOSE PF  . CBC w/Diff    Standing Status:   Future    Number of Occurrences:   1    Standing Expiration Date:   01/03/2019  . Comp Met (CMET)    Standing Status:   Future    Number of Occurrences:   1    Standing Expiration Date:   01/03/2019    Requested Prescriptions   Signed Prescriptions Disp Refills  . pantoprazole (PROTONIX) 40 MG tablet 30  tablet 0    Sig: Take 1 tablet (40 mg total) by mouth daily.

## 2018-01-05 ENCOUNTER — Telehealth: Payer: Self-pay | Admitting: Internal Medicine

## 2018-01-05 ENCOUNTER — Telehealth: Payer: Self-pay | Admitting: Family

## 2018-01-05 NOTE — Telephone Encounter (Signed)
Insurance has been submitted and verified for Prolia. Patient is responsible for a $250 copay. Due 03/06/18. Left message for patient to call back to schedule.  Okay to schedule... Visit Note: Prolia ($250 copay - okay to give per Gareth Eagle) Visit Type: Nurse Provider: Nurse

## 2018-01-05 NOTE — Telephone Encounter (Signed)
Pt given information per Jodi Mourning; she verbalizes understanding; the pt says that she has not heard from [facility]; verified that [facility] will contact her to set up the appointment; pt can be contacted at [phone]; also see CRM # [phone]; will route to office for notification of this encounter.

## 2018-01-09 ENCOUNTER — Other Ambulatory Visit: Payer: Self-pay | Admitting: Family

## 2018-01-16 ENCOUNTER — Other Ambulatory Visit: Payer: Medicare HMO

## 2018-01-23 ENCOUNTER — Other Ambulatory Visit: Payer: Self-pay | Admitting: Family

## 2018-01-23 ENCOUNTER — Ambulatory Visit
Admission: RE | Admit: 2018-01-23 | Discharge: 2018-01-23 | Disposition: A | Discharge status: Home / Self Care | Payer: Medicare HMO | Source: Ambulatory Visit | Attending: Family | Admitting: Family

## 2018-01-23 DIAGNOSIS — M549 Dorsalgia, unspecified: Secondary | ICD-10-CM

## 2018-01-23 DIAGNOSIS — R140 Abdominal distension (gaseous): Secondary | ICD-10-CM

## 2018-01-23 DIAGNOSIS — K7689 Other specified diseases of liver: Secondary | ICD-10-CM | POA: Diagnosis not present

## 2018-01-23 DIAGNOSIS — N281 Cyst of kidney, acquired: Secondary | ICD-10-CM

## 2018-01-24 ENCOUNTER — Telehealth: Payer: Self-pay | Admitting: Internal Medicine

## 2018-01-24 NOTE — Telephone Encounter (Signed)
Pt given results per notes of Jodi Mourning NP  on 01/23/18. Unable to document in result note due to result note not being routed to [facility].

## 2018-01-24 NOTE — Telephone Encounter (Signed)
Pt given results per notes of Jodi Mourning FNP on 01/23/18. Unable to document in result note due to result note not being routed to [facility].

## 2018-01-25 ENCOUNTER — Ambulatory Visit (HOSPITAL_COMMUNITY): Payer: Medicare HMO | Attending: Cardiology

## 2018-01-25 ENCOUNTER — Other Ambulatory Visit: Payer: Self-pay

## 2018-01-25 DIAGNOSIS — I2510 Atherosclerotic heart disease of native coronary artery without angina pectoris: Secondary | ICD-10-CM | POA: Diagnosis not present

## 2018-01-25 DIAGNOSIS — E785 Hyperlipidemia, unspecified: Secondary | ICD-10-CM

## 2018-01-25 DIAGNOSIS — I080 Rheumatic disorders of both mitral and aortic valves: Secondary | ICD-10-CM | POA: Diagnosis not present

## 2018-01-25 DIAGNOSIS — I350 Nonrheumatic aortic (valve) stenosis: Secondary | ICD-10-CM | POA: Diagnosis not present

## 2018-01-25 DIAGNOSIS — I4891 Unspecified atrial fibrillation: Secondary | ICD-10-CM | POA: Diagnosis not present

## 2018-01-25 DIAGNOSIS — I4892 Unspecified atrial flutter: Secondary | ICD-10-CM | POA: Diagnosis not present

## 2018-01-27 ENCOUNTER — Ambulatory Visit: Payer: Medicare HMO | Admitting: Family

## 2018-01-27 ENCOUNTER — Telehealth: Payer: Self-pay

## 2018-01-27 NOTE — Telephone Encounter (Signed)
Notes recorded by Frederik Schmidt, RN on 01/27/2018 at 8:18 AM EDT Informed patient of Echo results. She verbalized understanding.

## 2018-01-27 NOTE — Telephone Encounter (Signed)
-----   Message from Nelva Bush, MD sent at 01/27/2018  7:23 AM EDT ----- Please let Carrie [name] know that her heart is contracting normally.  Aortic valve remains moderately narrowed, similar to or minimally worse worse than last year.  We will discuss this further at her f/u visit later this month.

## 2018-01-30 ENCOUNTER — Ambulatory Visit (INDEPENDENT_AMBULATORY_CARE_PROVIDER_SITE_OTHER)
Admission: RE | Admit: 2018-01-30 | Discharge: 2018-01-30 | Disposition: A | Discharge status: Home / Self Care | Payer: Medicare HMO | Source: Ambulatory Visit | Attending: Family | Admitting: Family

## 2018-01-30 DIAGNOSIS — S32000S Wedge compression fracture of unspecified lumbar vertebra, sequela: Secondary | ICD-10-CM | POA: Diagnosis not present

## 2018-01-30 DIAGNOSIS — M549 Dorsalgia, unspecified: Secondary | ICD-10-CM | POA: Diagnosis not present

## 2018-01-30 DIAGNOSIS — S22000A Wedge compression fracture of unspecified thoracic vertebra, initial encounter for closed fracture: Secondary | ICD-10-CM | POA: Diagnosis not present

## 2018-02-08 ENCOUNTER — Ambulatory Visit (INDEPENDENT_AMBULATORY_CARE_PROVIDER_SITE_OTHER)
Admission: RE | Admit: 2018-02-08 | Discharge: 2018-02-08 | Disposition: A | Discharge status: Home / Self Care | Payer: Medicare HMO | Source: Ambulatory Visit | Attending: Family | Admitting: Family

## 2018-02-08 DIAGNOSIS — N281 Cyst of kidney, acquired: Secondary | ICD-10-CM | POA: Diagnosis not present

## 2018-02-08 MED ORDER — IOPAMIDOL (ISOVUE-300) INJECTION 61%
100.0000 mL | Freq: Once | INTRAVENOUS | Status: AC | PRN
  Administered 2018-02-08: 100 mL via INTRAVENOUS

## 2018-02-13 ENCOUNTER — Ambulatory Visit: Payer: Medicare HMO | Admitting: Internal Medicine

## 2018-02-13 ENCOUNTER — Encounter: Payer: Self-pay | Admitting: Internal Medicine

## 2018-02-13 VITALS — BP 122/70 | HR 64 | Ht 62.0 in | Wt 131.4 lb

## 2018-02-13 DIAGNOSIS — I2510 Atherosclerotic heart disease of native coronary artery without angina pectoris: Secondary | ICD-10-CM | POA: Diagnosis not present

## 2018-02-13 DIAGNOSIS — I4892 Unspecified atrial flutter: Secondary | ICD-10-CM

## 2018-02-13 DIAGNOSIS — I350 Nonrheumatic aortic (valve) stenosis: Secondary | ICD-10-CM

## 2018-02-13 DIAGNOSIS — E785 Hyperlipidemia, unspecified: Secondary | ICD-10-CM | POA: Diagnosis not present

## 2018-02-13 DIAGNOSIS — I10 Essential (primary) hypertension: Secondary | ICD-10-CM

## 2018-02-13 DIAGNOSIS — I422 Other hypertrophic cardiomyopathy: Secondary | ICD-10-CM

## 2018-02-13 NOTE — Progress Notes (Signed)
Follow-up Outpatient Visit Date: 02/13/2018  Primary Care Provider: [name], MD [address] [address]  Chief Complaint: Follow-up CAD, HCM, AS, and PAF  HPI:  Carrie [name] is a 71 y.o. year-old female with history of nonobstructive CAD, hypertrophic cardiomyopathy, paroxysmal atrial flutter, and aortic stenosis (moderate by echo in 01/2016), who presents for follow-up of chest pain.  I last saw Carrie [name] in April, at which time she was doing well other than seasonal allergies.  Repeat echo earlier this month showed normal LVEF with moderate AS, mild AI, and mild to moderate MR.  Today, Carrie [name] reports that she has been feeling well.  She denies chest pain, shortness of breath, palpitations, lightheadedness, orthopnea, and edema.  She is trying to walk at least 1/2 to 1 mile a day most days.  She is tolerating her medications well.  --------------------------------------------------------------------------------------------------  Cardiovascular History & Procedures: Cardiovascular Problems:  Paroxysmal atrial flutter  Non-obstructive coronary artery disease  Aortic stenosis  Risk Factors:  Known CAD and age > 44  Cath/PCI:  LHC (07/24/03): LMCA heavily calcified without significant stenosis. LAD with heavy calcification proximally. 50-60% stenosis just distal to D2. LCx calcified proximally without significant stenosis. Dominant RCA with moderate proximal calcification and mild luminal irregularities throughout. LVEDP 12. LVEF 70%.  CV Surgery:  None  EP Procedures and Devices:  30-day event monitor (12/17/15): Predominantly sinus rhythm with episode of atrial flutter with rate ~100 bpm.  Non-Invasive Evaluation(s):  TTE (01/25/18): Normal LV size with mild LVH.  LVEF 55-60% with normal wall motion.  Grade 1 diastolic dysfunction with elevated filling pressure.  Calcified aortic valve with moderate stenosis (mean gradient 30 mmHg)  and AI.  Mild to moderate MR with chordal SAM.  Moderate LAE.  TTE (11/11/16): Normal LV size with mild focal basal hypertrophy and severe hypertrophy of the inferior wall. LVEF 55-60% with dynamic obstruction. Moderate aortic stenosis (mean gradient 25 mmHg). Mild MR. Moderate left atrial enlargement. Normal RV size and wall thickness. Mild TR. Upper normal PA pressure.  TTE (02/10/16): Normal LV size with mild LVH and focal basal hypertrophy of the septum. LVEF 55-60% with grade 1 diastolic dysfunction and elevated filling pressure. Calcified aortic valve with moderate stenosis (mean gradient 29 mmHg, AVA 1.01 cm^2). MAC with chordal SAM and mild MR. Mild TR. Normal RV size and function.  Recent CV Pertinent Labs: Lab Results  Component Value Date   CHOL 192 11/22/2016   HDL 81 11/22/2016   LDLCALC 89 11/22/2016   LDLDIRECT 133.8 12/19/2012   TRIG 111 11/22/2016   CHOLHDL 2.4 11/22/2016   CHOLHDL 2.3 08/18/2015   K 4.3 01/03/2018   BUN 14 01/03/2018   BUN 19 09/13/2016   CREATININE 0.63 01/03/2018   CREATININE 0.75 03/16/2016    Past medical and surgical history were reviewed and updated in EPIC.  Current Meds  Medication Sig  . CALCIUM PO Take 1 tablet by mouth daily.   . Cholecalciferol (VITAMIN D3) 1000 UNITS CAPS Take 1 capsule by mouth daily.    . Difluprednate 0.05 % EMUL Instil 1 drop in operative eye(s) BID for 3 weeks following surgery  . DUREZOL 0.05 % EMUL INSTIL 1 DROP IN OPERATIVE EYE(S) TWICE DAILY FOR 3 WEEKS FOLLOWING SURGERY  . ELIQUIS 5 MG TABS tablet TAKE 1 TABLET BY MOUTH TWICE A DAY  . fish oil-omega-3 fatty acids 1000 MG capsule Take 1 g by mouth daily.    . metoprolol succinate (  TOPROL-XL) 50 MG 24 hr tablet Take 1 tablet (50 mg total) by mouth daily. Take with or immediately following a meal.  . nitroGLYCERIN (NITROLINGUAL) 0.4 MG/SPRAY spray Place 1 spray under the tongue 3 times/day as needed-between meals & bedtime.  Marland Kitchen ofloxacin (OCUFLOX) 0.3 %  ophthalmic solution Instill one drop BID in operative eye(s) starting 2 days prior to surgery and 3 weeks following surgery  . pantoprazole (PROTONIX) 40 MG tablet Take 1 tablet (40 mg total) by mouth daily.  . rosuvastatin (CRESTOR) 10 MG tablet Take 1 tablet (10 mg total) by mouth daily.  Marland Kitchen sulfamethoxazole-trimethoprim (BACTRIM DS,SEPTRA DS) 800-160 MG tablet Take 1 tablet by mouth 2 (two) times daily.  . Thiamine HCl (VITAMIN B-1) 250 MG tablet Take 500 mg by mouth daily.    Marland Kitchen triamcinolone cream (KENALOG) 0.1 % Apply 1 application topically 2 (two) times daily.  . vitamin B-12 (CYANOCOBALAMIN) 1000 MCG tablet Take 1,000 mcg by mouth daily.    Allergies: Codeine; Crestor [rosuvastatin]; and Latex  Social History   Tobacco Use  . Smoking status: Former Smoker    Last attempt to quit: 04/05/1999    Years since quitting: 18.8  . Smokeless tobacco: Never Used  . Tobacco comment: Regular Exercise-No  Substance Use Topics  . Alcohol use: Yes    Alcohol/week: 7.0 standard drinks    Types: 7 Glasses of wine per week    Comment: 1 drink per week  . Drug use: No    Family History  Problem Relation Age of Onset  . Heart disease Mother 37  . Congestive Heart Failure Mother   . Dementia Mother   . Hypertension Other   . Valvular heart disease Father        Had valves replaced  . Colon cancer Neg Hx   . Rectal cancer Neg Hx     Review of Systems: A 12-system review of systems was performed and was negative except as noted in the HPI.  --------------------------------------------------------------------------------------------------  Physical Exam: BP 122/70   Pulse 64   Ht _0  (1.575 m)   Wt 131 lb 6.4 oz (59.6 kg)   BMI 24.03 kg/m   General: NAD. HEENT: No conjunctival pallor or scleral icterus. Moist mucous membranes.  OP clear. Neck: Supple without lymphadenopathy, thyromegaly, JVD, or HJR. No carotid bruit. Lungs: Normal work of breathing. Clear to auscultation  bilaterally without wheezes or crackles. Heart: Regular rate and rhythm with 3/6 crescendo-decrescendo systolic murmur loudest at the right upper sternal border. No rubs or gallops. Non-displaced PMI. Abd: Bowel sounds present. Soft, NT/ND without hepatosplenomegaly Ext: No lower extremity edema.  Skin: Warm and dry without rash.  EKG: Normal sinus rhythm with left atrial enlargement and LVH.  Lab Results  Component Value Date   WBC 7.9 01/03/2018   HGB 14.6 01/03/2018   HCT 42.6 01/03/2018   MCV 91.9 01/03/2018   PLT 344.0 01/03/2018    Lab Results  Component Value Date   NA 139 01/03/2018   K 4.3 01/03/2018   CL 103 01/03/2018   CO2 28 01/03/2018   BUN 14 01/03/2018   CREATININE 0.63 01/03/2018   GLUCOSE 86 01/03/2018   ALT 13 01/03/2018    Lab Results  Component Value Date   CHOL 192 11/22/2016   HDL 81 11/22/2016   LDLCALC 89 11/22/2016   LDLDIRECT 133.8 12/19/2012   TRIG 111 11/22/2016   CHOLHDL 2.4 11/22/2016    --------------------------------------------------------------------------------------------------  ASSESSMENT AND PLAN: Nonobstructive coronary artery disease No  symptoms to suggest worsening coronary insufficiency.  Continue medical therapy to prevent progression of disease.  I will have Carrie [name] return for a lipid panel at her convenience.  If LDL has risen further, we will need to consider escalation of rosuvastatin (though she has been intolerant of high doses) versus addition of ezetimibe.  Atrial flutter No symptoms to suggest recurrence.  EKG today demonstrates sinus rhythm.  Continue metoprolol and apixaban.  Aortic stenosis Moderate AS noted on recent echocardiogram, minimally increased from prior study in 10/2016 (mean gradient 25 -> 30 mmHg).  Carrie [name] is asymptomatic.  We will continue close clinical follow-up.  Hypertrophic cardiomyopathy No syncope or other symptoms.  I recommend continued beta-blockade with target heart rate in the  60s.  No further intervention at this time.  Hypertension Blood pressure well controlled.  Continue current medications.  Hyperlipidemia We will recheck a lipid panel at the patient's convenience.  Continue rosuvastatin 10 mg daily for the time being.  Follow-up: Given my transition to [facility], I will have the patient follow-up with Dr. Meda Coffee in 6 months.  Nelva Bush, MD 02/13/2018 11:46 AM

## 2018-02-13 NOTE — Patient Instructions (Signed)
Medication Instructions:  none If you need a refill on your cardiac medications before your next appointment, please call your pharmacy.   Lab work:TOMORROW Fasting Lipid Panel If you have labs (blood work) drawn today and your tests are completely normal, you will receive your results only by: Marland Kitchen MyChart Message (if you have MyChart) OR . A paper copy in the mail If you have any lab test that is abnormal or we need to change your treatment, we will call you to review the results.  Testing/Procedures: none  Follow-Up: At [facility], you and your health needs are our priority.  As part of our continuing mission to provide you with exceptional heart care, we have created designated Provider Care Teams.  These Care Teams include your primary Cardiologist (physician) and Advanced Practice Providers (APPs -  Physician Assistants and Nurse Practitioners) who all work together to provide you with the care you need, when you need it. You will need a follow up appointment in 6 months.  Please call our office 2 months in advance to schedule this appointment.  You may see [name], MD or one of the following Advanced Practice Providers on your designated Care Team:   [place], PA-C Melina Copa, PA-C . Ermalinda Barrios, PA-C  Any Other Special Instructions Will Be Listed Below (If Applicable).

## 2018-02-13 NOTE — Addendum Note (Signed)
Addended by: Berit Raczkowski A on: 02/13/2018 01:11 PM   Modules accepted: Level of Service

## 2018-02-14 ENCOUNTER — Other Ambulatory Visit: Payer: Medicare HMO

## 2018-02-14 DIAGNOSIS — E785 Hyperlipidemia, unspecified: Secondary | ICD-10-CM | POA: Diagnosis not present

## 2018-02-14 DIAGNOSIS — I4892 Unspecified atrial flutter: Secondary | ICD-10-CM | POA: Diagnosis not present

## 2018-02-14 LAB — LIPID PANEL
CHOLESTEROL TOTAL: 209 mg/dL — AB (ref 100–199)
Chol/HDL Ratio: 2.7 ratio (ref 0.0–4.4)
HDL: 77 mg/dL (ref >39)
LDL CALC: 93 mg/dL (ref 0–99)
TRIGLYCERIDES: 197 mg/dL — AB (ref 0–149)
VLDL Cholesterol Cal: 39 mg/dL (ref 5–40)

## 2018-02-15 ENCOUNTER — Ambulatory Visit: Payer: Medicare HMO | Admitting: Internal Medicine

## 2018-02-15 ENCOUNTER — Other Ambulatory Visit: Payer: Self-pay | Admitting: *Deleted

## 2018-02-15 DIAGNOSIS — E785 Hyperlipidemia, unspecified: Secondary | ICD-10-CM

## 2018-02-15 MED ORDER — ROSUVASTATIN CALCIUM 20 MG PO TABS: 20.0000 mg | ORAL_TABLET | Freq: Every day | ORAL | 3 refills | Status: DC

## 2018-02-15 NOTE — Progress Notes (Signed)
Reviewed with patient. She agrees to increase Crestor to 20 mg daily and will come on 05/18/18 for fasting lipids/ALT.

## 2018-03-01 NOTE — Telephone Encounter (Signed)
I have prior auth number [id number] valid 07/27/17-4/06-2018---I will have margaret/prolia to double check on this information, but patient has been approved to get prolia with the information I have, documentation attached to patient's card

## 2018-03-01 NOTE — Telephone Encounter (Signed)
Pt called stating he insurance is not covering the other part of the injection, please advise and call patient

## 2018-03-06 DIAGNOSIS — Z961 Presence of intraocular lens: Secondary | ICD-10-CM | POA: Diagnosis not present

## 2018-03-06 DIAGNOSIS — H2512 Age-related nuclear cataract, left eye: Secondary | ICD-10-CM | POA: Diagnosis not present

## 2018-03-06 DIAGNOSIS — H25012 Cortical age-related cataract, left eye: Secondary | ICD-10-CM | POA: Diagnosis not present

## 2018-03-07 ENCOUNTER — Ambulatory Visit (INDEPENDENT_AMBULATORY_CARE_PROVIDER_SITE_OTHER): Payer: Medicare HMO | Admitting: *Deleted

## 2018-03-07 DIAGNOSIS — M810 Age-related osteoporosis without current pathological fracture: Secondary | ICD-10-CM

## 2018-03-07 MED ORDER — DENOSUMAB 60 MG/ML ~~LOC~~ SOSY
60.0000 mg | PREFILLED_SYRINGE | Freq: Once | SUBCUTANEOUS | Status: AC
  Administered 2018-03-07: 60 mg via SUBCUTANEOUS

## 2018-03-08 DIAGNOSIS — Z1231 Encounter for screening mammogram for malignant neoplasm of breast: Secondary | ICD-10-CM | POA: Diagnosis not present

## 2018-03-08 LAB — HM MAMMOGRAPHY

## 2018-03-09 ENCOUNTER — Telehealth: Payer: Self-pay | Admitting: Internal Medicine

## 2018-03-09 NOTE — Telephone Encounter (Signed)
Call returned to pt.  Questioned if she has received recommendations from her surgeon re: holding Eliquis and Metoprolol pre-op.  Stated she will be having a cataract removed from left eye 11/27, and has a call placed to Dr. Kellie Moor office to ask about these recommendations.  Advised that the surgeon will typically state how long the blood thinner needs to be held prior to the surgery, and then the PCP can make any further recommendations re: alternative therapy.  Pt. Verb. Understanding, and stated, when she receives a call back, she will have the surgeon's office call re: this matter.

## 2018-03-09 NOTE — Telephone Encounter (Signed)
Copied from [place] [id number]. Topic: General - Other >> Mar 09, 2018  8:41 AM Leward Quan A wrote: Reason for CRM: Patient called to say that she is having surgery on 03/22/18 and need to know what to do about her ELIQUIS 5 MG TABS tablet and metoprolol succinate (TOPROL-XL) 50 MG 24 hr tablet would like a call back to inform her of what to do on that date. Ph# [phone]

## 2018-03-16 ENCOUNTER — Encounter: Payer: Self-pay | Admitting: Internal Medicine

## 2018-03-16 NOTE — Progress Notes (Signed)
Outside notes received. Information abstracted. Notes sent to scan.  

## 2018-03-22 DIAGNOSIS — H25012 Cortical age-related cataract, left eye: Secondary | ICD-10-CM | POA: Diagnosis not present

## 2018-03-22 DIAGNOSIS — H2512 Age-related nuclear cataract, left eye: Secondary | ICD-10-CM | POA: Diagnosis not present

## 2018-05-04 ENCOUNTER — Other Ambulatory Visit: Payer: Self-pay | Admitting: Internal Medicine

## 2018-05-04 DIAGNOSIS — I4892 Unspecified atrial flutter: Secondary | ICD-10-CM

## 2018-05-04 NOTE — Telephone Encounter (Signed)
Please review for refill. Thank You.

## 2018-05-08 ENCOUNTER — Other Ambulatory Visit: Payer: Self-pay | Admitting: *Deleted

## 2018-05-08 ENCOUNTER — Ambulatory Visit (INDEPENDENT_AMBULATORY_CARE_PROVIDER_SITE_OTHER): Payer: Medicare HMO | Admitting: Internal Medicine

## 2018-05-08 ENCOUNTER — Encounter: Payer: Self-pay | Admitting: Internal Medicine

## 2018-05-08 VITALS — BP 140/80 | HR 72 | Temp 97.9°F | Resp 16 | Ht 62.0 in | Wt 130.2 lb

## 2018-05-08 DIAGNOSIS — N950 Postmenopausal bleeding: Secondary | ICD-10-CM | POA: Insufficient documentation

## 2018-05-08 DIAGNOSIS — I10 Essential (primary) hypertension: Secondary | ICD-10-CM | POA: Diagnosis not present

## 2018-05-08 DIAGNOSIS — I4892 Unspecified atrial flutter: Secondary | ICD-10-CM

## 2018-05-08 DIAGNOSIS — J040 Acute laryngitis: Secondary | ICD-10-CM | POA: Diagnosis not present

## 2018-05-08 DIAGNOSIS — J301 Allergic rhinitis due to pollen: Secondary | ICD-10-CM | POA: Diagnosis not present

## 2018-05-08 MED ORDER — METHYLPREDNISOLONE 4 MG PO TBPK: ORAL_TABLET | ORAL | 0 refills | Status: AC

## 2018-05-08 MED ORDER — APIXABAN 5 MG PO TABS: 5.0000 mg | ORAL_TABLET | Freq: Two times a day (BID) | ORAL | 8 refills | Status: DC

## 2018-05-08 MED ORDER — LEVOCETIRIZINE DIHYDROCHLORIDE 5 MG PO TABS: 5.0000 mg | ORAL_TABLET | Freq: Every evening | ORAL | 1 refills | Status: DC

## 2018-05-08 NOTE — Patient Instructions (Signed)
Laryngitis    Laryngitis is inflammation of the vocal cords that causes symptoms such as hoarseness or loss of voice. The vocal cords are two bands of muscles in your throat. When you speak, these cords come together and vibrate. The vibrations come out through your mouth as sound. When your vocal cords are inflamed, your voice sounds different.  Laryngitis can be temporary (acute) or long-term (chronic). Most cases of acute laryngitis improve with time. Chronic laryngitis is laryngitis that lasts for more than 3 weeks.  What are the causes?  Acute laryngitis may be caused by:   A viral infection.   Lots of talking, yelling, or singing. This is also called vocal strain.   A bacterial infection.  Chronic laryngitis may be caused by:   Vocal strain.   Injury to your vocal cords.   Acid reflux (gastroesophageal reflux disease, or GERD).   Allergies.   A sinus infection.   Smoking.   Alcohol abuse.   Breathing in chemicals or dust.   Growths on the vocal cords.  What increases the risk?  The following factors may make you more likely to develop this condition:   Smoking.   Alcohol abuse.   Having allergies.   Chronic irritants in the workplace, such as toxic fumes.  What are the signs or symptoms?  Symptoms of this condition may include:   Low, hoarse voice.   Loss of voice.   Dry cough.   Sore or dry throat.   Stuffy nose.  How is this diagnosed?  This condition may be diagnosed based on:   Your symptoms and a physical exam.   Throat culture.   Blood test.   A procedure in which your health care provider looks at your vocal cords with a mirror or viewing tube (laryngoscopy).  How is this treated?  Treatment for laryngitis depends on what is causing it.   Usually, treatment involves resting your voice and using medicines to soothe your throat.   If your laryngitis is caused by a bacterial infection, you may need to take antibiotic medicine.   If your laryngitis is caused by a growth, you may  need to have a procedure to remove it.  Follow these instructions at home:  Medicines   Take over-the-counter and prescription medicines only as told by your health care provider.   If you were prescribed an antibiotic medicine, take it as told by your health care provider. Do not stop taking the antibiotic even if you start to feel better.  General instructions   Talk as little as possible. Also avoid whispering, which can cause vocal strain.   Write instead of talking. Do this until your voice is back to normal.   Drink enough fluid to keep your urine pale yellow.   Breathe in moist air. Use a humidifier if you live in a dry climate.   Do not use any products that contain nicotine or tobacco, such as cigarettes and e-cigarettes. If you need help quitting, ask your health care provider.  Contact a health care provider if:   You have a fever.   You have increasing pain.   Your symptoms do not get better in 2 weeks.  Get help right away if:   You cough up blood.   You have difficulty swallowing.   You have trouble breathing.  Summary   Laryngitis is inflammation of the vocal cords that causes symptoms such as hoarseness or loss of voice.   Laryngitis can be temporary (  Chart[employer] certified [employer] Patient Education  [employer].

## 2018-05-08 NOTE — Telephone Encounter (Signed)
Eliquis 5mg  refill request received; pt is 73 yrs old, wt-59.6kg, Crea-0.63 on 01/03/2018, last seen by Dr. Saunders Revel on 02/13/2018; will send in refill to requested pharmacy.

## 2018-05-08 NOTE — Progress Notes (Signed)
Subjective:  Patient ID: Carrie [name], female    DOB: [birth year]  Age: 73 y.o. MRN: [id number]  CC: URI and Allergic Rhinitis    HPI Carrie [name] presents for a one-week history of nasal congestion, postnasal drip, and laryngitis.  Outpatient Medications Prior to Visit  Medication Sig Dispense Refill  . apixaban (ELIQUIS) 5 MG TABS tablet Take 1 tablet (5 mg total) by mouth 2 (two) times daily. 60 tablet 8  . CALCIUM PO Take 1 tablet by mouth daily.     . Cholecalciferol (VITAMIN D3) 1000 UNITS CAPS Take 1 capsule by mouth daily.      . Difluprednate 0.05 % EMUL Instil 1 drop in operative eye(s) BID for 3 weeks following surgery    . DUREZOL 0.05 % EMUL INSTIL 1 DROP IN OPERATIVE EYE(S) TWICE DAILY FOR 3 WEEKS FOLLOWING SURGERY  1  . ELIQUIS 5 MG TABS tablet TAKE 1 TABLET BY MOUTH TWICE A DAY 60 tablet 6  . fish oil-omega-3 fatty acids 1000 MG capsule Take 1 g by mouth daily.      . metoprolol succinate (TOPROL-XL) 50 MG 24 hr tablet Take 1 tablet (50 mg total) by mouth daily. Take with or immediately following a meal. 90 tablet 3  . nitroGLYCERIN (NITROLINGUAL) 0.4 MG/SPRAY spray Place 1 spray under the tongue 3 times/day as needed-between meals & bedtime.    Marland Kitchen ofloxacin (OCUFLOX) 0.3 % ophthalmic solution Instill one drop BID in operative eye(s) starting 2 days prior to surgery and 3 weeks following surgery    . pantoprazole (PROTONIX) 40 MG tablet Take 1 tablet (40 mg total) by mouth daily. 30 tablet 0  . rosuvastatin (CRESTOR) 20 MG tablet Take 1 tablet (20 mg total) by mouth daily. 90 tablet 3  . sulfamethoxazole-trimethoprim (BACTRIM DS,SEPTRA DS) 800-160 MG tablet Take 1 tablet by mouth 2 (two) times daily. 10 tablet 0  . Thiamine HCl (VITAMIN B-1) 250 MG tablet Take 500 mg by mouth daily.      Marland Kitchen triamcinolone cream (KENALOG) 0.1 % Apply 1 application topically 2 (two) times daily. 30 g 0  . vitamin B-12 (CYANOCOBALAMIN) 1000 MCG tablet Take 1,000 mcg by mouth daily.      No facility-administered medications prior to visit.     ROS Review of Systems  Constitutional: Negative for chills, fatigue and fever.  HENT: Positive for congestion, postnasal drip, rhinorrhea and voice change. Negative for facial swelling, sinus pressure, sinus pain, sneezing and trouble swallowing.   Respiratory: Negative for cough, chest tightness, shortness of breath and wheezing.   Cardiovascular: Negative.  Negative for chest pain, palpitations and leg swelling.  Gastrointestinal: Negative for abdominal pain, diarrhea, nausea and vomiting.  Genitourinary: Negative.  Negative for difficulty urinating.  Musculoskeletal: Negative.  Negative for arthralgias.  Skin: Negative.  Negative for color change and rash.  Neurological: Negative.  Negative for dizziness, weakness, light-headedness and headaches.  Hematological: Negative for adenopathy. Does not bruise/bleed easily.  Psychiatric/Behavioral: Negative.     Objective:  BP 140/80 (BP Location: Left Arm, Patient Position: Sitting, Cuff Size: Normal)   Pulse 72   Temp 97.9 F (36.6 C) (Oral)   Resp 16   Ht 5\' 2"  (1.575 m)   Wt 130 lb 4 oz (59.1 kg)   SpO2 99%   BMI 23.82 kg/m   BP Readings from Last 3 Encounters:  05/08/18 140/80  02/13/18 122/70  01/03/18 114/72    Wt Readings from Last 3 Encounters:  05/08/18 130 lb  4 oz (59.1 kg)  02/13/18 131 lb 6.4 oz (59.6 kg)  01/03/18 129 lb 12.8 oz (58.9 kg)    Physical Exam Vitals signs reviewed.  Constitutional:      General: She is not in acute distress.    Appearance: She is not ill-appearing, toxic-appearing or diaphoretic.  HENT:     Nose: Mucosal edema, congestion and rhinorrhea present.     Right Nostril: No epistaxis.     Left Nostril: No epistaxis.     Right Turbinates: Swollen. Not enlarged or pale.     Left Turbinates: Swollen. Not enlarged or pale.     Right Sinus: No maxillary sinus tenderness or frontal sinus tenderness.     Left Sinus: No maxillary  sinus tenderness or frontal sinus tenderness.     Mouth/Throat:     Pharynx: Oropharynx is clear. No posterior oropharyngeal erythema.  Eyes:     General: No scleral icterus.    Conjunctiva/sclera: Conjunctivae normal.  Neck:     Musculoskeletal: Normal range of motion and neck supple. No muscular tenderness.  Cardiovascular:     Rate and Rhythm: Normal rate and regular rhythm.     Heart sounds: No murmur. No gallop.   Pulmonary:     Effort: Pulmonary effort is normal.     Breath sounds: No stridor. No wheezing, rhonchi or rales.  Abdominal:     General: Abdomen is flat.     Palpations: There is no mass.     Tenderness: There is no abdominal tenderness.  Musculoskeletal: Normal range of motion.        General: No swelling.  Skin:    General: Skin is warm and dry.     Coloration: Skin is not pale.     Findings: No rash.  Neurological:     General: No focal deficit present.     Mental Status: She is oriented to person, place, and time. Mental status is at baseline.     Lab Results  Component Value Date   WBC 7.9 01/03/2018   HGB 14.6 01/03/2018   HCT 42.6 01/03/2018   PLT 344.0 01/03/2018   GLUCOSE 86 01/03/2018   CHOL 209 (H) 02/14/2018   TRIG 197 (H) 02/14/2018   HDL 77 02/14/2018   LDLDIRECT 133.8 12/19/2012   LDLCALC 93 02/14/2018   ALT 13 01/03/2018   AST 19 01/03/2018   NA 139 01/03/2018   K 4.3 01/03/2018   CL 103 01/03/2018   CREATININE 0.63 01/03/2018   BUN 14 01/03/2018   CO2 28 01/03/2018   TSH 0.79 01/20/2016    Ct Abdomen W Wo Contrast  Result Date: 02/09/2018 CLINICAL DATA:  73 year old female with history of complex cyst in the left kidney noted on prior ultrasound examination. Follow-up evaluation. EXAM: CT ABDOMEN WITHOUT AND WITH CONTRAST TECHNIQUE: Multidetector CT imaging of the abdomen was performed following the standard protocol before and following the bolus administration of intravenous contrast. CONTRAST:  160mL ISOVUE-300 IOPAMIDOL  (ISOVUE-300) INJECTION 61% COMPARISON:  No priors.  Abdominal ultrasound 01/23/2018. FINDINGS: Lower chest: Mild cardiomegaly. Severe calcifications of the aortic valve and mitral annulus. Atherosclerotic calcifications in the descending thoracic aorta as well as the left circumflex and right coronary arteries. Hepatobiliary: No suspicious cystic or solid hepatic lesions. No intra or extrahepatic biliary ductal dilatation. Gallbladder is normal in appearance. Pancreas: No pancreatic mass. No pancreatic ductal dilatation. No pancreatic or peripancreatic fluid or inflammatory changes. Spleen: Unremarkable. Adrenals/Urinary Tract: No calcifications are identified within the collecting system of  either kidney. Subcentimeter low-attenuation lesions are noted in the kidneys bilaterally measuring 5 mm or less in size, too small to characterize, but statistically likely to represent tiny cysts. No larger more suspicious appearing renal lesions. No hydroureteronephrosis in the visualized portions of the abdomen. Bilateral adrenal glands are normal in appearance. Stomach/Bowel: Normal appearance of the stomach. No pathologic dilatation of visualized portions of small bowel or colon. Vascular/Lymphatic: Aortic atherosclerosis, without evidence of aneurysm or dissection in the abdominal vasculature. No lymphadenopathy noted in the abdomen. Other: No significant volume of ascites and no pneumoperitoneum noted in the visualized portions of the peritoneal cavity. Musculoskeletal: Multiple chronic appearing vertebral body compression fractures at T8, T11, L1, L2, L3, L4 and L5, most severe at T8 where there is 70% loss of anterior vertebral body height. There are no aggressive appearing lytic or blastic lesions noted in the visualized portions of the skeleton. IMPRESSION: 1. The only renal lesions identified measure 5 mm or less in size and are too small to characterize by CT, but have an appearance most compatible with tiny cysts,  strongly favored to be benign. If there is a history of hematuria, these could be definitively characterized with MRI of the abdomen with and without IV gadolinium if clinically appropriate. 2. Aortic atherosclerosis, in addition to least 2 vessel coronary artery disease. Assessment for potential risk factor modification, dietary therapy or pharmacologic therapy may be warranted, if clinically indicated. 3. There are severe calcifications of the aortic valve and mitral annulus. Echocardiographic correlation for evaluation of potential valvular dysfunction may be warranted if clinically indicated. 4. Additional incidental findings, as above. Electronically Signed   By: Vinnie Langton M.D.   On: 02/09/2018 07:26    Assessment & Plan:   Jasalyn was seen today for uri and allergic rhinitis .  Diagnoses and all orders for this visit:  Allergic rhinitis due to pollen, unspecified seasonality- She is having a flareup of symptoms.  Will treat with a nonsedating antihistamine and a course of methylprednisolone. -     levocetirizine (XYZAL) 5 MG tablet; Take 1 tablet (5 mg total) by mouth every evening. -     methylPREDNISolone (MEDROL DOSEPAK) 4 MG TBPK tablet; TAKE AS DIRECTED  Essential hypertension- Her blood pressure is well controlled.  Laryngitis, acute- I offered her reassurance and told her to rest her voice for the next 3 days.   I am having Home. Wendell start on levocetirizine and methylPREDNISolone. I am also having her maintain her fish oil-omega-3 fatty acids, vitamin B-1, Vitamin D3, CALCIUM PO, nitroGLYCERIN, metoprolol succinate, triamcinolone cream, ofloxacin, DUREZOL, Difluprednate, sulfamethoxazole-trimethoprim, vitamin B-12, pantoprazole, rosuvastatin, ELIQUIS, and apixaban.  Meds ordered this encounter  Medications  . levocetirizine (XYZAL) 5 MG tablet    Sig: Take 1 tablet (5 mg total) by mouth every evening.    Dispense:  90 tablet    Refill:  1  . methylPREDNISolone  (MEDROL DOSEPAK) 4 MG TBPK tablet    Sig: TAKE AS DIRECTED    Dispense:  21 tablet    Refill:  0     Follow-up: Return in about 3 weeks (around 05/29/2018).  Scarlette Calico, MD

## 2018-05-15 ENCOUNTER — Encounter: Payer: Self-pay | Admitting: Internal Medicine

## 2018-05-15 ENCOUNTER — Ambulatory Visit (INDEPENDENT_AMBULATORY_CARE_PROVIDER_SITE_OTHER): Payer: Medicare HMO | Admitting: Internal Medicine

## 2018-05-15 VITALS — BP 144/78 | HR 73 | Temp 98.4°F | Resp 16 | Ht 62.0 in | Wt 132.0 lb

## 2018-05-15 DIAGNOSIS — I10 Essential (primary) hypertension: Secondary | ICD-10-CM

## 2018-05-15 NOTE — Patient Instructions (Signed)
Continue your current medications.    Continue to monitor your BP and if your continue to see elevated numbers please contact us.

## 2018-05-15 NOTE — Assessment & Plan Note (Signed)
Blood pressure at home elevated over the past 3 days for no obvious reason-she had a headache the first night, but was asymptomatic since then Blood pressure here slightly elevated, but reasonable No change in medications Advised her to continue to monitor her blood pressure at home and to let us know if it remains elevated

## 2018-05-15 NOTE — Progress Notes (Signed)
Subjective:    Patient ID: Carrie [name], female    DOB: 1945/11/01, 73 y.o.   MRN: [id number]  HPI The patient is here for an acute visit.  Elevated BP: She is taking her blood pressure medication daily as prescribed.  She monitors her blood pressure at home on a regular basis.  3 nights ago she woke up with a headache and her blood pressure was elevated-150s/104.  She denies any other symptoms.  Her blood pressure has been high since then and she is unsure why.  She wanted to come in because she was concerned this could affect her heart.  Saturday: 145/105, 130/90,140/80 and Sunday 156/91  Her BP at home is typically well controlled.  She has not taken anything over the counter.  She denies increased salt intake or anything different.  She denies any chest pain, palpitations or leg edema.  She denies any lightheadedness or dizziness.  She denies cold symptoms or fever.   BP Readings from Last 3 Encounters:  05/15/18 (!) 144/78  05/08/18 140/80  02/13/18 122/70     Medications and allergies reviewed with patient and updated if appropriate.  Patient Active Problem List   Diagnosis Date Noted  . Laryngitis, acute 05/08/2018  . Essential hypertension 02/13/2018  . Paroxysmal atrial fibrillation ([place]) 08/23/2017  . Positive colorectal cancer screening using Cologuard test 02/24/2017  . Coronary artery disease involving native coronary artery of native heart without angina pectoris 02/24/2017  . Pure hypercholesterolemia 02/24/2017  . Hypertrophic cardiomyopathy ([place]) 11/22/2016  . Paroxysmal atrial flutter ([employer] [facility]) 07/08/2016  . Varicose veins of lower extremities with other complications 38/88/2800  . Allergic rhinitis 12/28/2012  . OAB (overactive bladder) 06/22/2012  . Routine general medical examination at a health care facility 06/22/2012  . Moderate aortic stenosis 09/05/2010  . Coronary artery disease due to lipid rich plaque 06/26/2009  . SUPRAVENTRICULAR TACHYCARDIA  06/23/2009  . Hyperlipidemia 03/06/2009  . Osteoporosis 03/06/2009    Current Outpatient Medications on File Prior to Visit  Medication Sig Dispense Refill  . apixaban (ELIQUIS) 5 MG TABS tablet Take 1 tablet (5 mg total) by mouth 2 (two) times daily. 60 tablet 8  . CALCIUM PO Take 1 tablet by mouth daily.     . Cholecalciferol (VITAMIN D3) 1000 UNITS CAPS Take 1 capsule by mouth daily.      . Difluprednate 0.05 % EMUL Instil 1 drop in operative eye(s) BID for 3 weeks following surgery    . DUREZOL 0.05 % EMUL INSTIL 1 DROP IN OPERATIVE EYE(S) TWICE DAILY FOR 3 WEEKS FOLLOWING SURGERY  1  . ELIQUIS 5 MG TABS tablet TAKE 1 TABLET BY MOUTH TWICE A DAY 60 tablet 6  . fish oil-omega-3 fatty acids 1000 MG capsule Take 1 g by mouth daily.      Marland Kitchen levocetirizine (XYZAL) 5 MG tablet Take 1 tablet (5 mg total) by mouth every evening. 90 tablet 1  . metoprolol succinate (TOPROL-XL) 50 MG 24 hr tablet Take 1 tablet (50 mg total) by mouth daily. Take with or immediately following a meal. 90 tablet 3  . nitroGLYCERIN (NITROLINGUAL) 0.4 MG/SPRAY spray Place 1 spray under the tongue 3 times/day as needed-between meals & bedtime.    Marland Kitchen ofloxacin (OCUFLOX) 0.3 % ophthalmic solution Instill one drop BID in operative eye(s) starting 2 days prior to surgery and 3 weeks following surgery    . pantoprazole (PROTONIX) 40 MG tablet Take 1 tablet (40 mg total) by mouth daily. 30 tablet  0  . rosuvastatin (CRESTOR) 20 MG tablet Take 1 tablet (20 mg total) by mouth daily. 90 tablet 3  . Thiamine HCl (VITAMIN B-1) 250 MG tablet Take 500 mg by mouth daily.      Marland Kitchen triamcinolone cream (KENALOG) 0.1 % Apply 1 application topically 2 (two) times daily. 30 g 0  . vitamin B-12 (CYANOCOBALAMIN) 1000 MCG tablet Take 1,000 mcg by mouth daily.     No current facility-administered medications on file prior to visit.     Past Medical History:  Diagnosis Date  . Aortic stenosis   . Coronary artery disease    a. Prior LHC  (07/24/03): LMCA heavily calcified without significant stenosis, LAD with heavy calcification proximally. 50-60% stenosis just distal to D2, LCx calcified proximally without significant stenosis, Dominant RCA with moderate proximal calcification and mild luminal irregularities throughout, LVEDP 12, LVEF 70%  . Diastolic dysfunction without heart failure   . Former tobacco use   . Former tobacco use   . HOCM (hypertrophic obstructive cardiomyopathy) ([place])   . Mild AI (aortic insufficiency)   . Osteoporosis, unspecified   . Other and unspecified hyperlipidemia   . Paroxysmal atrial flutter ([place])   . Pneumonia   . Wears hearing aid    left    Past Surgical History:  Procedure Laterality Date  . ATRIAL ABLATION SURGERY  2005  . BREAST SURGERY  1982   Benign tumor removed RT  . BUNIONECTOMY  04/07/2012   Procedure: Lillard Anes;  Surgeon: Alta Corning, MD;  Location: [facility];  Service: Orthopedics;  Laterality: Left;  CHEVRON OSTEOTOMY LEFT FOOT   . CARDIAC CATHETERIZATION  2005  . GANGLION CYST EXCISION     left wrist  . HERNIA REPAIR  1990   rt ing  . TONSILLECTOMY    . TUBAL LIGATION      Social History   Socioeconomic History  . Marital status: Divorced    Spouse name: Not on file  . Number of children: Not on file  . Years of education: Not on file  . Highest education level: Not on file  Occupational History  . Not on file  Social Needs  . Financial resource strain: Not on file  . Food insecurity:    Worry: Not on file    Inability: Not on file  . Transportation needs:    Medical: Not on file    Non-medical: Not on file  Tobacco Use  . Smoking status: Former Smoker    Last attempt to quit: 04/05/1999    Years since quitting: 19.1  . Smokeless tobacco: Never Used  . Tobacco comment: Regular Exercise-No  Substance and Sexual Activity  . Alcohol use: Yes    Alcohol/week: 7.0 standard drinks    Types: 7 Glasses of wine per week    Comment: 1  drink per week  . Drug use: No  . Sexual activity: Not on file  Lifestyle  . Physical activity:    Days per week: Not on file    Minutes per session: Not on file  . Stress: Not on file  Relationships  . Social connections:    Talks on phone: Not on file    Gets together: Not on file    Attends religious service: Not on file    Active member of club or organization: Not on file    Attends meetings of clubs or organizations: Not on file    Relationship status: Not on file  Other Topics Concern  .  Not on file  Social History Narrative  . Not on file    Family History  Problem Relation Age of Onset  . Heart disease Mother 67  . Congestive Heart Failure Mother   . Dementia Mother   . Hypertension Other   . Valvular heart disease Father        Had valves replaced  . Colon cancer Neg Hx   . Rectal cancer Neg Hx     Review of Systems  Constitutional: Negative for fever.  Respiratory: Negative for cough, shortness of breath and wheezing.   Cardiovascular: Negative for chest pain and leg swelling.  Neurological: Positive for headaches (over the past few days, none today). Negative for dizziness and light-headedness.       Objective:   Vitals:   05/15/18 1549  BP: (!) 144/78  Pulse: 73  Resp: 16  Temp: 98.4 F (36.9 C)  SpO2: 97%   BP Readings from Last 3 Encounters:  05/15/18 (!) 144/78  05/08/18 140/80  02/13/18 122/70   Wt Readings from Last 3 Encounters:  05/15/18 132 lb (59.9 kg)  05/08/18 130 lb 4 oz (59.1 kg)  02/13/18 131 lb 6.4 oz (59.6 kg)   Body mass index is 24.14 kg/m.   Physical Exam    Constitutional: Appears well-developed and well-nourished. No distress.  HENT:  Head: Normocephalic and atraumatic.  Neck: Neck supple. No tracheal deviation present. No thyromegaly present.  No cervical lymphadenopathy Cardiovascular: Normal rate, regular rhythm and normal heart sounds.   3/6 systolic murmur heard. No carotid bruit .  No  edema Pulmonary/Chest: Effort normal and breath sounds normal. No respiratory distress. No has no wheezes. No rales.  Skin: Skin is warm and dry. Not diaphoretic.  Psychiatric: Normal mood and affect. Behavior is normal.       Assessment & Plan:    See Problem List for Assessment and Plan of chronic medical problems.

## 2018-05-18 ENCOUNTER — Other Ambulatory Visit: Payer: Medicare HMO | Admitting: *Deleted

## 2018-05-18 DIAGNOSIS — Z01419 Encounter for gynecological examination (general) (routine) without abnormal findings: Secondary | ICD-10-CM | POA: Diagnosis not present

## 2018-05-18 DIAGNOSIS — E785 Hyperlipidemia, unspecified: Secondary | ICD-10-CM | POA: Diagnosis not present

## 2018-05-18 LAB — LIPID PANEL
CHOL/HDL RATIO: 2.8 ratio (ref 0.0–4.4)
Cholesterol, Total: 214 mg/dL — ABNORMAL HIGH (ref 100–199)
HDL: 77 mg/dL (ref >39)
LDL Calculated: 102 mg/dL — ABNORMAL HIGH (ref 0–99)
Triglycerides: 177 mg/dL — ABNORMAL HIGH (ref 0–149)
VLDL CHOLESTEROL CAL: 35 mg/dL (ref 5–40)

## 2018-05-18 LAB — ALT: ALT: 25 IU/L (ref 0–32)

## 2018-05-29 ENCOUNTER — Telehealth: Payer: Self-pay

## 2018-05-29 DIAGNOSIS — L720 Epidermal cyst: Secondary | ICD-10-CM | POA: Diagnosis not present

## 2018-05-29 DIAGNOSIS — E785 Hyperlipidemia, unspecified: Secondary | ICD-10-CM

## 2018-05-29 MED ORDER — EZETIMIBE 10 MG PO TABS: 10.0000 mg | ORAL_TABLET | Freq: Every day | ORAL | 3 refills | Status: DC

## 2018-05-29 NOTE — Telephone Encounter (Signed)
Notes recorded by Frederik Schmidt, RN on 05/29/2018 at 9:06 AM EST The patient has been notified of the LAB results and verbalized understanding. Labs have been scheduled for 08/24/18. All questions (if any) were answered. Frederik Schmidt, RN 05/29/2018 9:06 AM   ------

## 2018-05-29 NOTE — Telephone Encounter (Signed)
-----   Message from Nelva Bush, MD sent at 05/29/2018  6:57 AM EST ----- Please let Carrie [name] know that her cholesterol is relatively stable with LDL a little bit above goal at 102 (goal < 70).  If she is willing to try escalation of statin therapy, I suggest increasing rosuvastatin to 20 mg daily.  If she is concerned about this even history of myalgias with high intensity statin therapy in the past, we could try adding ezetimibe 10 mg daily instead.  In either case, we should repeat a lipid panel and ALT in 3 months.

## 2018-05-30 DIAGNOSIS — Z0100 Encounter for examination of eyes and vision without abnormal findings: Secondary | ICD-10-CM | POA: Diagnosis not present

## 2018-05-30 DIAGNOSIS — Z961 Presence of intraocular lens: Secondary | ICD-10-CM | POA: Diagnosis not present

## 2018-05-30 DIAGNOSIS — H532 Diplopia: Secondary | ICD-10-CM | POA: Diagnosis not present

## 2018-06-01 ENCOUNTER — Ambulatory Visit: Payer: Self-pay | Admitting: *Deleted

## 2018-06-01 MED ORDER — OSELTAMIVIR PHOSPHATE 75 MG PO CAPS: 75.0000 mg | ORAL_CAPSULE | Freq: Every day | ORAL | 0 refills | Status: DC

## 2018-06-01 NOTE — Telephone Encounter (Signed)
Pt calling to request a prescription for Tamiflu. Pt states she picked up her grandson today and yesterday from school and her grandson was confirmed to have the flu on today. Pt also states that her grandson's sister also had the flu. Pt states the only symptoms she is experiencing at this time is a runny nose and cough. pt states she has received flu vaccine this year. If pt is able to get a prescription for Tamiflu she would like it sent to [facility] in [place].   Reason for Disposition . [1] Influenza EXPOSURE (Close Contact) within last 48 hours (2 days) AND [2] exposed person is HIGH RISK (e.g., age > 42 years, pregnant, HIV+, chronic medical condition)  Answer Assessment - Initial Assessment Questions 1. TYPE of EXPOSURE: "How were you exposed?" (e.g., close contact, not a close contact)     By grandson 2. DATE of EXPOSURE: "When did the exposure occur?" (e.g., hour, days, weeks)     Grandson was confirmed today 3. HIGH RISK for COMPLICATIONS: "Do you have any heart or lung problems? Do you have a weakened immune system?" (e.g., CHF, COPD, asthma, HIV positive, chemotherapy, renal failure, diabetes mellitus, sickle cell anemia)     Hx of a-fib and heart murmur 4. SYMPTOMS: "Do you have any symptoms?" (e.g., cough, fever, sore throat, difficulty breathing).     Runny nose and cough  Protocols used: INFLUENZA EXPOSURE-A-AH

## 2018-06-01 NOTE — Telephone Encounter (Signed)
Pt informed rx was sent.  

## 2018-06-06 DIAGNOSIS — R69 Illness, unspecified: Secondary | ICD-10-CM | POA: Diagnosis not present

## 2018-06-30 ENCOUNTER — Other Ambulatory Visit: Payer: Medicare HMO

## 2018-06-30 ENCOUNTER — Encounter: Payer: Self-pay | Admitting: Internal Medicine

## 2018-06-30 ENCOUNTER — Ambulatory Visit (INDEPENDENT_AMBULATORY_CARE_PROVIDER_SITE_OTHER): Payer: Medicare HMO | Admitting: Internal Medicine

## 2018-06-30 VITALS — BP 126/70 | HR 85 | Temp 97.9°F | Ht 62.0 in | Wt 130.0 lb

## 2018-06-30 DIAGNOSIS — R399 Unspecified symptoms and signs involving the genitourinary system: Secondary | ICD-10-CM

## 2018-06-30 LAB — POCT URINALYSIS DIPSTICK
Bilirubin, UA: NEGATIVE
Glucose, UA: NEGATIVE
Ketones, UA: NEGATIVE
Leukocytes, UA: NEGATIVE
Nitrite, UA: NEGATIVE
Protein, UA: NEGATIVE
Spec Grav, UA: >=1.03 — AB (ref 1.010–1.025)
UROBILINOGEN UA: 0.2 U/dL
pH, UA: 6 (ref 5.0–8.0)

## 2018-06-30 MED ORDER — NITROFURANTOIN MONOHYD MACRO 100 MG PO CAPS: 100.0000 mg | ORAL_CAPSULE | Freq: Two times a day (BID) | ORAL | 0 refills | Status: DC

## 2018-06-30 NOTE — Progress Notes (Signed)
   Subjective:   Patient ID: Carrie [name], female    DOB: [birth year], 73 y.o.   MRN: [id number]  HPI The patient is a 73 y.o. female coming in for uti symptoms. Started last night with urgency and frequency. Main symptoms are: also having some burning and urgency. Denies fevers or chills. Mild nausea and poor appetite just since this started. Still drinking liquids. Overall it is worsening. Has tried nothing. Denies blood in urine.   Review of Systems  Constitutional: Positive for appetite change.  Respiratory: Negative.   Cardiovascular: Negative.   Gastrointestinal: Positive for abdominal pain and nausea. Negative for abdominal distention, constipation, diarrhea and vomiting.  Genitourinary: Positive for dysuria, frequency and urgency.  Musculoskeletal: Negative.   Skin: Negative.     Objective:  Physical Exam Constitutional:      Appearance: She is well-developed.  HENT:     Head: Normocephalic and atraumatic.  Neck:     Musculoskeletal: Normal range of motion.  Cardiovascular:     Rate and Rhythm: Normal rate and regular rhythm.  Pulmonary:     Effort: Pulmonary effort is normal. No respiratory distress.     Breath sounds: Normal breath sounds. No wheezing or rales.  Abdominal:     General: Bowel sounds are normal. There is no distension.     Palpations: Abdomen is soft.     Tenderness: There is no abdominal tenderness. There is no rebound.  Skin:    General: Skin is warm and dry.  Neurological:     Mental Status: She is alert and oriented to person, place, and time.     Coordination: Coordination normal.     Vitals:   06/30/18 1311  BP: 126/70  Pulse: 85  Temp: 97.9 F (36.6 C)  TempSrc: Oral  SpO2: 97%  Weight: 130 lb (59 kg)  Height: 5\' 2"  (1.575 m)    Assessment & Plan:

## 2018-06-30 NOTE — Patient Instructions (Signed)
We have sent in macrobid (nitrofurantoin) to take 1 pill twice a day for 1 week.   We will let you know Monday about the urine culture.

## 2018-06-30 NOTE — Assessment & Plan Note (Addendum)
With some blood in urine. Could be early infection. Will send culture and cover with macrobid until culture returns. No gross blood or history of that. If no bacteria may need random surveillance and if persistent hematuria may need urology referral. She is on eliquis. Recent CT abdomen 01/2018 without stones or other abnormality except renal cyst which is not likely to be causing this problem.

## 2018-07-01 LAB — URINE CULTURE
MICRO NUMBER:: 287147
Result:: NO GROWTH
SPECIMEN QUALITY:: ADEQUATE

## 2018-08-02 ENCOUNTER — Telehealth: Payer: Self-pay | Admitting: Cardiology

## 2018-08-02 NOTE — Telephone Encounter (Signed)
Virtual Visit Pre-Appointment Phone Call  Steps For Call:  1. Confirm consent - "In the setting of the current Covid19 crisis, you are scheduled for a (phone ) visit with Dr. Meda Coffee on 08/23/18 at 8 am.  Just as we do with many in-office visits, in order for you to participate in this visit, we must obtain consent.  If you'd like, I can send this to your mychart (if signed up) or email for you to review.  Otherwise, I can obtain your verbal consent now.  All virtual visits are billed to your insurance company just like a normal visit would be.  By agreeing to a virtual visit, we'd like you to understand that the technology does not allow for your provider to perform an examination, and thus may limit your provider's ability to fully assess your condition.  Finally, though the technology is pretty good, we cannot assure that it will always work on either your or our end, and in the setting of a video visit, we may have to convert it to a phone-only visit.  In either situation, we cannot ensure that we have a secure connection.  Are you willing to proceed?"   2. Advise patient to be prepared with any vital sign or heart rhythm information, their current medicines, and a piece of paper and pen handy for any instructions they may receive the day of their visit  3. Inform patient they will receive a phone call 15 minutes prior to their appointment time (may be from unknown caller ID) so they should be prepared to answer  4. Confirm that appointment type is correct in Epic appointment notes ( telephone with Dr. Meda Coffee on 08/23/18 at 8 am)    Carrie [name] has been deemed a candidate for a follow-up tele-health visit to limit community exposure during the Covid-19 pandemic. I spoke with the patient via phone to ensure availability of phone/video source, confirm preferred email & phone number, and discuss instructions and expectations.  I reminded Carrie [name] to be prepared  with any vital sign and/or heart rhythm information that could potentially be obtained via home monitoring, at the time of her visit. I reminded Carrie [name] to expect a phone call at the time of her visit if her visit.  Did the patient verbally acknowledge consent to treatment? PATIENT DID GIVE VERBAL CONSENT OVER THE PHONE [facility] NOT ACTIVATED YET BUT PATIENT WILL WORK ON THIS-LINK SENT TO HER EMAIL TO ACTIVATE) FOR DR NELSON TO TREAT HER AS A REGULAR TELEPHONE VISIT ON 08/23/18 AT 3 AM. [name], LPN 06/29/1441 1:54 PM  CONSENT FOR TELE-HEALTH VISIT - PLEASE REVIEW  I hereby voluntarily request, consent and authorize [facility] and its employed or contracted physicians, Engineer, materials, nurse practitioners or other licensed health care professionals (the Practitioner), to provide me with telemedicine health care services (the Services") as deemed necessary by the treating Practitioner. I acknowledge and consent to receive the Services by the Practitioner via telemedicine. I understand that the telemedicine visit will involve communicating with the Practitioner through live audiovisual communication technology and the disclosure of certain medical information by electronic transmission. I acknowledge that I have been given the opportunity to request an in-person assessment or other available alternative prior to the telemedicine visit and am voluntarily participating in the telemedicine visit.  I understand that I have the right to withhold or withdraw my consent to the use of telemedicine in the course of my  care at any time, without affecting my right to future care or treatment, and that the Practitioner or I may terminate the telemedicine visit at any time. I understand that I have the right to inspect all information obtained and/or recorded in the course of the telemedicine visit and may receive copies of available information for a reasonable fee.  I understand that some of the  potential risks of receiving the Services via telemedicine include:   Delay or interruption in medical evaluation due to technological equipment failure or disruption;  Information transmitted may not be sufficient (e.g. poor resolution of images) to allow for appropriate medical decision making by the Practitioner; and/or   In rare instances, security protocols could fail, causing a breach of personal health information.  Furthermore, I acknowledge that it is my responsibility to provide information about my medical history, conditions and care that is complete and accurate to the best of my ability. I acknowledge that Practitioner's advice, recommendations, and/or decision may be based on factors not within their control, such as incomplete or inaccurate data provided by me or distortions of diagnostic images or specimens that may result from electronic transmissions. I understand that the practice of medicine is not an exact science and that Practitioner makes no warranties or guarantees regarding treatment outcomes. I acknowledge that I will receive a copy of this consent concurrently upon execution via email to the email address I last provided but may also request a printed copy by calling the office of [place].    I understand that my insurance will be billed for this visit.   I have read or had this consent read to me.  I understand the contents of this consent, which adequately explains the benefits and risks of the Services being provided via telemedicine.   I have been provided ample opportunity to ask questions regarding this consent and the Services and have had my questions answered to my satisfaction.  I give my informed consent for the services to be provided through the use of telemedicine in my medical care  By participating in this telemedicine visit I agree to the above.

## 2018-08-02 NOTE — Telephone Encounter (Signed)
I spoke to the patient who has a virtual visit by Phone with Dr Meda Coffee on 4/29 and labs to follow 4/30.  She would like to discuss the cost of Zetia and how it is making her feel (lack luster and no energy) at this appointment.  She feels ok to wait until this date and is curious to see her lab values.  She questions whether the Zetia is working.

## 2018-08-02 NOTE — Telephone Encounter (Signed)
New Message   Pt c/o medication issue:  1. Name of Medication: ezetimibe (ZETIA) 10 MG tablet    2. How are you currently taking this medication (dosage and times per day)? Take 1 tablet (10 mg total) by mouth daily.  3. Are you having a reaction (difficulty breathing--STAT)?   4. What is your medication issue? Patient states that this medication drains her energy. She said its not working and it too expensive.   I also changed patients appt she is wanting to know what she should do about her lab work. Please call to discuss.

## 2018-08-02 NOTE — Telephone Encounter (Signed)
I spoke with the pt as well and she is scheduled for a Virtual visit with Dr Meda Coffee on 4/29 at 0800.  Pt states she wants to do a regular TELEPHONE VISIT with Dr Meda Coffee and not the video visit, for that's to much for her.  Explained the easy process of this with the pt, and she still declined to be seen via Video.  Did inform the pt that we did send her a message to her email on getting her mychart active.  She states she will have her daughter who works in healthcare, help her with getting this set up.  Pt did give verbal consent for [place] to treat her via TELEPHONE VISIT ON 4/29 with Dr Meda Coffee at 0800.  Informed the pt that when her mychart gets activated, we will also send her the consent information to her that way.  Pt verbalized understanding and agrees with this plan.

## 2018-08-23 ENCOUNTER — Encounter: Payer: Self-pay | Admitting: Cardiology

## 2018-08-23 ENCOUNTER — Telehealth (INDEPENDENT_AMBULATORY_CARE_PROVIDER_SITE_OTHER): Payer: Medicare HMO | Admitting: Cardiology

## 2018-08-23 ENCOUNTER — Other Ambulatory Visit: Payer: Self-pay

## 2018-08-23 DIAGNOSIS — I10 Essential (primary) hypertension: Secondary | ICD-10-CM

## 2018-08-23 DIAGNOSIS — I2583 Coronary atherosclerosis due to lipid rich plaque: Secondary | ICD-10-CM | POA: Diagnosis not present

## 2018-08-23 DIAGNOSIS — I422 Other hypertrophic cardiomyopathy: Secondary | ICD-10-CM | POA: Diagnosis not present

## 2018-08-23 DIAGNOSIS — I2510 Atherosclerotic heart disease of native coronary artery without angina pectoris: Secondary | ICD-10-CM

## 2018-08-23 DIAGNOSIS — I350 Nonrheumatic aortic (valve) stenosis: Secondary | ICD-10-CM | POA: Diagnosis not present

## 2018-08-23 DIAGNOSIS — E785 Hyperlipidemia, unspecified: Secondary | ICD-10-CM

## 2018-08-23 NOTE — Patient Instructions (Signed)
Medication Instructions:   Your physician recommends that you continue on your current medications as directed. Please refer to the Current Medication list given to you today.  If you need a refill on your cardiac medications before your next appointment, please call your pharmacy.    Lab work:  PRIOR TO YOUR 6 MONTH FOLLOW-UP APPOINTMENT WITH DR NELSON--WE WILL CHECK A CMET, CBC W DIFF, TSH, LIPIDS, AND HEMOGLOBIN A1C--PLEASE COME FASTING TO THIS LAB APPOINTMENT--WE WILL CALL YOU CLOSER TO THE 6 MONTH TIME FRAME TO SCHEDULE THIS LAB APPOINTMENT AS WELL AS YOUR FOLLOW-UP APPOINTMENT WITH DR Meda Coffee.  If you have labs (blood work) drawn today and your tests are completely normal, you will receive your results only by: Marland Kitchen MyChart Message (if you have MyChart) OR . A paper copy in the mail If you have any lab test that is abnormal or we need to change your treatment, we will call you to review the results.    Follow-Up: At [facility], you and your health needs are our priority.  As part of our continuing mission to provide you with exceptional heart care, we have created designated Provider Care Teams.  These Care Teams include your primary Cardiologist (physician) and Advanced Practice Providers (APPs -  Physician Assistants and Nurse Practitioners) who all work together to provide you with the care you need, when you need it.  Your physician wants you to follow-up in: [place] will receive a reminder letter in the mail two months in advance. If you don't receive a letter, please call our office to schedule the follow-up appointment.

## 2018-08-23 NOTE — Progress Notes (Signed)
Virtual Visit via Telephone Note   This visit type was conducted due to national recommendations for restrictions regarding the COVID-19 Pandemic (e.g. social distancing) in an effort to limit this patient's exposure and mitigate transmission in our community.  Due to her co-morbid illnesses, this patient is at least at moderate risk for complications without adequate follow up.  This format is felt to be most appropriate for this patient at this time.  The patient did not have access to video technology/had technical difficulties with video requiring transitioning to audio format only (telephone).  All issues noted in this document were discussed and addressed.  No physical exam could be performed with this format.  Please refer to the patient's chart for her  consent to telehealth for [facility].   Evaluation Performed:  Follow-up visit  Date:  08/23/2018   ID:  Carrie [name], DOB 1946/03/19, MRN [id number]  Patient Location: Home Provider Location: Home  PCP:  [name], MD  Cardiologist:  [name], MD  Electrophysiologist:  None   Chief Complaint:  fatigue  History of Present Illness:    Carrie [name] is a 73 y.o. female with history of nonobstructive CAD, hypertrophic cardiomyopathy, paroxysmal atrial flutter, and aortic stenosis (moderate by echo in 01/2016), who presents for follow-up of chest pain.  I last saw Carrie [name] in April, at which time she was doing well other than seasonal allergies.  Repeat echo earlier this month showed normal LVEF with moderate AS, mild AI, and mild to moderate MR.  She transition to me from Dr End as he moved to [place]. She has been doing well, walking a mile/day with no changes with DOE or CP, no palpitations, dizziness or falls. No myalgias or bleeding. The only complain is fatigue since starting zetia.  The patient does not have symptoms concerning for COVID-19 infection (fever, chills, cough, or new shortness of breath).     Past Medical History:  Diagnosis Date  . Aortic stenosis   . Coronary artery disease    a. Prior LHC (07/24/03): LMCA heavily calcified without significant stenosis, LAD with heavy calcification proximally. 50-60% stenosis just distal to D2, LCx calcified proximally without significant stenosis, Dominant RCA with moderate proximal calcification and mild luminal irregularities throughout, LVEDP 12, LVEF 70%  . Diastolic dysfunction without heart failure   . Former tobacco use   . Former tobacco use   . HOCM (hypertrophic obstructive cardiomyopathy) ([place])   . Mild AI (aortic insufficiency)   . Osteoporosis, unspecified   . Other and unspecified hyperlipidemia   . Paroxysmal atrial flutter ([place])   . Pneumonia   . Wears hearing aid    left   Past Surgical History:  Procedure Laterality Date  . ATRIAL ABLATION SURGERY  2005  . BREAST SURGERY  1982   Benign tumor removed RT  . BUNIONECTOMY  04/07/2012   Procedure: Lillard Anes;  Surgeon: Alta Corning, MD;  Location: [facility];  Service: Orthopedics;  Laterality: Left;  CHEVRON OSTEOTOMY LEFT FOOT   . CARDIAC CATHETERIZATION  2005  . GANGLION CYST EXCISION     left wrist  . HERNIA REPAIR  1990   rt ing  . TONSILLECTOMY    . TUBAL LIGATION       Current Meds  Medication Sig  . apixaban (ELIQUIS) 5 MG TABS tablet Take 1 tablet (5 mg total) by mouth 2 (two) times daily.  Marland Kitchen CALCIUM PO Take 1 tablet by mouth daily.   Marland Kitchen  Cholecalciferol (VITAMIN D3) 1000 UNITS CAPS Take 1 capsule by mouth daily.    Marland Kitchen ezetimibe (ZETIA) 10 MG tablet Take 1 tablet (10 mg total) by mouth daily.  . fish oil-omega-3 fatty acids 1000 MG capsule Take 1 g by mouth daily.    . fluticasone (FLONASE) 50 MCG/ACT nasal spray fluticasone propionate 50 mcg/actuation nasal spray,suspension  SPRAY 2 SPRAYS INTO EACH NOSTRIL EVERY DAY  . metoprolol succinate (TOPROL-XL) 50 MG 24 hr tablet Take 1 tablet (50 mg total) by mouth daily. Take with or  immediately following a meal.  . nitroGLYCERIN (NITROLINGUAL) 0.4 MG/SPRAY spray Place 1 spray under the tongue 3 times/day as needed-between meals & bedtime.  . rosuvastatin (CRESTOR) 20 MG tablet Take 1 tablet (20 mg total) by mouth daily.  . Thiamine HCl (VITAMIN B-1) 250 MG tablet Take 500 mg by mouth daily.    . vitamin B-12 (CYANOCOBALAMIN) 1000 MCG tablet Take 1,000 mcg by mouth daily.     Allergies:   Codeine; Crestor [rosuvastatin]; and Latex   Social History   Tobacco Use  . Smoking status: Former Smoker    Last attempt to quit: 04/05/1999    Years since quitting: 19.3  . Smokeless tobacco: Never Used  . Tobacco comment: Regular Exercise-No  Substance Use Topics  . Alcohol use: Yes    Alcohol/week: 7.0 standard drinks    Types: 7 Glasses of wine per week    Comment: 1 drink per week  . Drug use: No     Family Hx: The patient's family history includes Congestive Heart Failure in her mother; Dementia in her mother; Heart disease (age of onset: 35) in her mother; Hypertension in an other family member; Valvular heart disease in her father. There is no history of Colon cancer or Rectal cancer.  ROS:   Please see the history of present illness.    All other systems reviewed and are negative.  Prior CV studies:   The following studies were reviewed today:  TTE:  - Left ventricle: The cavity size was normal. Wall thickness was   increased in a pattern of mild LVH. Systolic function was normal.   The estimated ejection fraction was in the range of 55% to 60%.   Wall motion was normal; there were no regional wall motion   abnormalities. Doppler parameters are consistent with abnormal   left ventricular relaxation (grade 1 diastolic dysfunction).   Doppler parameters are consistent with high ventricular filling   pressure. - Aortic valve: Valve mobility was restricted. There was moderate   stenosis. There was mild regurgitation. - Mitral valve: Severely calcified  annulus. There was systolic   anterior motion of the chordal structures. There was mild to   moderate regurgitation. - Left atrium: The atrium was moderately dilated.  Impressions:  - Normal LV systolic function; mild LVH; mild diastolic   dysfunction; calcified aortic valve with moderate AS (mean   gradient 30 mmHg) and mild AI; severe MAC and chordal SAM; mild   to moderate MR; moderate LAE; mild TR.  Labs/Other Tests and Data Reviewed:    EKG:  No ECG reviewed.  Recent Labs: 01/03/2018: BUN 14; Creatinine, Ser 0.63; Hemoglobin 14.6; Platelets 344.0; Potassium 4.3; Sodium 139 05/18/2018: ALT 25   Recent Lipid Panel Lab Results  Component Value Date/Time   CHOL 214 (H) 05/18/2018 09:38 AM   TRIG 177 (H) 05/18/2018 09:38 AM   HDL 77 05/18/2018 09:38 AM   CHOLHDL 2.8 05/18/2018 09:38 AM   CHOLHDL 2.3  08/18/2015 10:30 AM   LDLCALC 102 (H) 05/18/2018 09:38 AM   LDLDIRECT 133.8 12/19/2012 08:28 AM    Wt Readings from Last 3 Encounters:  08/23/18 128 lb 12.8 oz (58.4 kg)  06/30/18 130 lb (59 kg)  05/15/18 132 lb (59.9 kg)     Objective:    Vital Signs:  BP 126/75   Pulse 95   Ht _0  (1.575 m)   Wt 128 lb 12.8 oz (58.4 kg)   SpO2 97%   BMI 23.56 kg/m    VITAL SIGNS:  reviewed   ASSESSMENT & PLAN:    Nonobstructive coronary artery disease No symptoms to suggest worsening coronary insufficiency.  Continue medical therapy to prevent progression of disease. Zetia was added in January for LDL 102 and TG 177, she feels more tires, she is advised to hold for a week and if no improvement than restart. Repeat lipids are scheduled for tomorrow.  Atrial flutter No symptoms to suggest recurrence.  EKG today demonstrates sinus rhythm.  Continue metoprolol and apixaban. No bleeding.  Aortic stenosis Moderate AS noted on recent echocardiogram, minimally increased from prior study in 10/2016 (mean gradient 25 -> 30 mmHg).  Ms. Lair is asymptomatic.  We will continue close  clinical follow-up.  Hypertrophic cardiomyopathy No syncope or other symptoms.  I recommend continued beta-blockade with target heart rate in the 60s.  No further intervention at this time.  Hypertension Blood pressure well controlled.  Continue current medications.  Hyperlipidemia We will recheck a lipid panel at the patient's convenience.  Continue rosuvastatin 10 mg daily for the time being.   COVID-19 Education: The signs and symptoms of COVID-19 were discussed with the patient and how to seek care for testing (follow up with PCP or arrange E-visit).  The importance of social distancing was discussed today.  Time:   Today, I have spent 20 minutes with the patient with telehealth technology discussing the above problems.     Medication Adjustments/Labs and Tests Ordered: Current medicines are reviewed at length with the patient today.  Concerns regarding medicines are outlined above.   Tests Ordered: No orders of the defined types were placed in this encounter.   Medication Changes: No orders of the defined types were placed in this encounter.   Disposition:  Follow up in 6 month(s)  Signed, [name], MD  08/23/2018 8:46 AM    [facility]

## 2018-08-23 NOTE — Addendum Note (Signed)
Addended by: [name] on: 08/23/2018 09:17 AM   Modules accepted: Orders

## 2018-08-24 ENCOUNTER — Other Ambulatory Visit: Payer: Self-pay

## 2018-08-24 ENCOUNTER — Ambulatory Visit: Payer: Medicare HMO | Admitting: Cardiology

## 2018-08-24 ENCOUNTER — Other Ambulatory Visit: Payer: Medicare HMO | Admitting: *Deleted

## 2018-08-24 DIAGNOSIS — E785 Hyperlipidemia, unspecified: Secondary | ICD-10-CM

## 2018-08-24 LAB — LIPID PANEL
Chol/HDL Ratio: 2.2 ratio (ref 0.0–4.4)
Cholesterol, Total: 173 mg/dL (ref 100–199)
HDL: 80 mg/dL (ref >39)
LDL Calculated: 61 mg/dL (ref 0–99)
Triglycerides: 158 mg/dL — ABNORMAL HIGH (ref 0–149)
VLDL Cholesterol Cal: 32 mg/dL (ref 5–40)

## 2018-08-24 LAB — ALT: ALT: 18 IU/L (ref 0–32)

## 2018-09-06 ENCOUNTER — Ambulatory Visit: Payer: Medicare HMO | Admitting: Family

## 2018-09-07 DIAGNOSIS — L239 Allergic contact dermatitis, unspecified cause: Secondary | ICD-10-CM | POA: Diagnosis not present

## 2018-09-21 ENCOUNTER — Telehealth: Payer: Self-pay

## 2018-09-21 NOTE — Telephone Encounter (Signed)
Copied from [place] [id number]. Topic: General - Other >> Sep 21, 2018  8:13 AM Carolyn Stare wrote:  Pt call to say her insurance company has approve for her to have  the prolia, she is asking what she need to do now  Patient informed that we are waiting on faxed confirmation of approval---it would be best idea to have that confirmed approval before we give injection to ensure insurance coverage---patient is ok with waiting on paperwork a little longer, if we don't get something in the next week or two, patient will call back

## 2018-09-28 NOTE — Telephone Encounter (Signed)
Patient did get the approval letter of shot on 5/15.  Patient is checking if the office got fax. Call back # [phone]

## 2018-09-29 NOTE — Telephone Encounter (Signed)
Spoke to patient. Will check with Jonelle Sidle to follow up on Monday when she returns to the office.

## 2018-10-02 ENCOUNTER — Telehealth: Payer: Self-pay | Admitting: Cardiology

## 2018-10-02 DIAGNOSIS — I4892 Unspecified atrial flutter: Secondary | ICD-10-CM

## 2018-10-02 NOTE — Telephone Encounter (Signed)
Can we start her on 2 week Zio patch monitoring? I would also increase her toprol XL to 75 mg po daily.thank you, KN

## 2018-10-02 NOTE — Telephone Encounter (Signed)
Spoke with pt who states she believes she was in AF yesterday with HR's between 119-140. She states she does not know how long it lasted. This morning her HR was in the 70's and she feels much better than she did yesterday. She has been taking her Toprol and Eliquis. She has not missed a dose.   She states her c/o CP only lasted "a split second" and was a brief sharp pain in her left chest area. The pain was gone, however she did use a spray of nitro.   I advised her I would forward to Dr Meda Coffee for recommendation, but this morning her HR and BP look stable. If she has an episode of sustained CP not relieved by rest and nitro, she should call 911 and head the the emergency dept.   She has verbalized understanding of the above and has no additional questions.

## 2018-10-02 NOTE — Telephone Encounter (Signed)
New Message    Patient c/o Palpitations:  High priority if patient c/o lightheadedness, shortness of breath, or chest pain  1) How long have you had palpitations/irregular HR/ Afib? Are you having the symptoms now? She said it is going back to normal but she feels like her heart doesn't feel right, she said she can feel a pain. 2-3 days   2) Are you currently experiencing lightheadedness, SOB or CP? She feels like she cant breathe like she has been in the past   3) Do you have a history of afib (atrial fibrillation) or irregular heart rhythm? Yes   4) Have you checked your BP or HR? (document readings if available): BP 145/78  Hr 72   5) Are you experiencing any other symptoms? Pt said this morning she felt a twinge in her heart, chest area. She said it was not a normal thing, and she did use Nitroglycerin.

## 2018-10-03 ENCOUNTER — Other Ambulatory Visit: Payer: Self-pay | Admitting: Cardiology

## 2018-10-03 DIAGNOSIS — I350 Nonrheumatic aortic (valve) stenosis: Secondary | ICD-10-CM

## 2018-10-03 DIAGNOSIS — E785 Hyperlipidemia, unspecified: Secondary | ICD-10-CM

## 2018-10-03 MED ORDER — METOPROLOL SUCCINATE ER 50 MG PO TB24: 50.0000 mg | ORAL_TABLET | Freq: Every day | ORAL | 3 refills | Status: DC

## 2018-10-03 NOTE — Telephone Encounter (Signed)
Pt's medication was sent to pt's pharmacy as requested. Confirmation received.  °

## 2018-10-03 NOTE — Telephone Encounter (Signed)
Appointment scheduled.

## 2018-10-03 NOTE — Telephone Encounter (Signed)
Faxed approval has been received---patient advised, will schedule nurse visit

## 2018-10-04 ENCOUNTER — Ambulatory Visit (INDEPENDENT_AMBULATORY_CARE_PROVIDER_SITE_OTHER): Payer: Medicare HMO

## 2018-10-04 ENCOUNTER — Other Ambulatory Visit: Payer: Self-pay

## 2018-10-04 DIAGNOSIS — M810 Age-related osteoporosis without current pathological fracture: Secondary | ICD-10-CM | POA: Diagnosis not present

## 2018-10-04 MED ORDER — METOPROLOL SUCCINATE ER 25 MG PO TB24: 75.0000 mg | ORAL_TABLET | Freq: Every day | ORAL | 3 refills | Status: DC

## 2018-10-04 MED ORDER — DENOSUMAB 60 MG/ML ~~LOC~~ SOSY
60.0000 mg | PREFILLED_SYRINGE | Freq: Once | SUBCUTANEOUS | Status: AC
  Administered 2018-10-04: 60 mg via SUBCUTANEOUS

## 2018-10-04 NOTE — Telephone Encounter (Signed)
Pt advised and verbalized understanding.

## 2018-10-04 NOTE — Progress Notes (Signed)
prolia Injection given.   Carrie Pita J Jose Alleyne, MD  

## 2018-10-05 ENCOUNTER — Telehealth: Payer: Self-pay | Admitting: *Deleted

## 2018-10-05 NOTE — Telephone Encounter (Signed)
[employer] will call today to verify mailing address to send 14 day ZIO AT long term live telemetry monitor to your home. Patient inquired if this would be covered by her insurance.  [employer] will be able to tell her exact out of pocket cost when they call.  Instructions briefly reviewed as they will be included in her monitor kit.  If she has questions or problems with monitor she may reach out to [facility] at [phone], they are available 24/7.

## 2018-10-07 ENCOUNTER — Ambulatory Visit (INDEPENDENT_AMBULATORY_CARE_PROVIDER_SITE_OTHER): Payer: Medicare HMO | Admitting: Family Medicine

## 2018-10-07 ENCOUNTER — Encounter: Payer: Self-pay | Admitting: Family Medicine

## 2018-10-07 DIAGNOSIS — L282 Other prurigo: Secondary | ICD-10-CM

## 2018-10-07 MED ORDER — PREDNISONE 20 MG PO TABS: 40.0000 mg | ORAL_TABLET | Freq: Every day | ORAL | 0 refills | Status: AC

## 2018-10-07 NOTE — Progress Notes (Signed)
Chief Complaint  Patient presents with  . Edema    Pt reports facial swelling burning and itching     Carrie [name] is a 73 y.o. female here for a skin complaint. Due to COVID-19 pandemic, we are interacting via telephone. I verified patient's ID using 2 identifiers. Patient agreed to proceed with visit via this method. Patient is at home, I am at office. Patient and I are present for visit.   Duration: 1 day Location: face and ears Pruritic? Yes Painful? Yes Drainage? No New soaps/lotions/topicals/detergents? No Sick contacts? No Other associated symptoms: redness Denies peeling or fevers.  Therapies tried thus far: Benadryl  ROS:  Const: No fevers Skin: As noted in HPI  Past Medical History:  Diagnosis Date  . Aortic stenosis   . Coronary artery disease    a. Prior LHC (07/24/03): LMCA heavily calcified without significant stenosis, LAD with heavy calcification proximally. 50-60% stenosis just distal to D2, LCx calcified proximally without significant stenosis, Dominant RCA with moderate proximal calcification and mild luminal irregularities throughout, LVEDP 12, LVEF 70%  . Diastolic dysfunction without heart failure   . Former tobacco use   . Former tobacco use   . HOCM (hypertrophic obstructive cardiomyopathy) ([place])   . Mild AI (aortic insufficiency)   . Osteoporosis, unspecified   . Other and unspecified hyperlipidemia   . Paroxysmal atrial flutter ([place])   . Pneumonia   . Wears hearing aid    left   Exam No conversational dyspnea Age appropriate judgment and insight Nml affect and mood  Pruritic rash - Plan: predniSONE (DELTASONE) 20 MG tablet, 5 d pred burst, Zyrtec daily instead of Benadryl. Vaseline or non-scented emollient on face to retain moisture. Try not to itch. Warning signs and symptoms verbalized. Not most ideal way to evaluate skin compliant, may need to f/u with reg pcp in person if no improvement.  Total time spent: 11 min F/u prn. The patient  voiced understanding and agreement to the plan.  [place], DO 10/07/18 10:09 AM

## 2018-10-09 ENCOUNTER — Ambulatory Visit (INDEPENDENT_AMBULATORY_CARE_PROVIDER_SITE_OTHER): Payer: Medicare HMO

## 2018-10-09 DIAGNOSIS — I4892 Unspecified atrial flutter: Secondary | ICD-10-CM | POA: Diagnosis not present

## 2018-10-17 ENCOUNTER — Telehealth: Payer: Self-pay | Admitting: Cardiology

## 2018-10-17 NOTE — Telephone Encounter (Signed)
New message:    Patient calling concerning her device and states she has had it on for about 1 week and she is itching. Please call patient.

## 2018-10-29 ENCOUNTER — Other Ambulatory Visit: Payer: Self-pay | Admitting: Internal Medicine

## 2018-10-29 DIAGNOSIS — J301 Allergic rhinitis due to pollen: Secondary | ICD-10-CM

## 2018-10-31 ENCOUNTER — Other Ambulatory Visit: Payer: Self-pay

## 2018-10-31 NOTE — Telephone Encounter (Signed)
Will route this message to our Monitor Techs to check the status of pts monitor and results.  We will follow-up with the pt accordingly thereafter.

## 2018-10-31 NOTE — Telephone Encounter (Signed)
Endorsed to the pt her monitor results and recommendations per Dr Meda Coffee, for her to continue her current dose of metoprolol.  Pt verbalized understanding and agrees with this plan.

## 2018-10-31 NOTE — Telephone Encounter (Signed)
Follow up   Patient states that she sent the monitor back in but has not heard anything back about the results. Please call.

## 2018-10-31 NOTE — Telephone Encounter (Signed)
-----   Message from Dorothy Spark, MD sent at 10/31/2018 10:45 AM EDT ----- Sinus bradycardia to sinus tachycardia. Very short runs of atrial tachycardia and nsVT. No atrial fibrillation. Continue the same dose of metoprolol.

## 2018-11-01 ENCOUNTER — Other Ambulatory Visit: Payer: Self-pay

## 2018-11-01 ENCOUNTER — Encounter: Payer: Self-pay | Admitting: Internal Medicine

## 2018-11-01 ENCOUNTER — Other Ambulatory Visit (INDEPENDENT_AMBULATORY_CARE_PROVIDER_SITE_OTHER): Payer: Medicare HMO

## 2018-11-01 ENCOUNTER — Ambulatory Visit (INDEPENDENT_AMBULATORY_CARE_PROVIDER_SITE_OTHER): Payer: Medicare HMO | Admitting: Internal Medicine

## 2018-11-01 VITALS — BP 140/64 | HR 70 | Temp 98.4°F | Resp 16 | Ht 62.0 in | Wt 129.5 lb

## 2018-11-01 DIAGNOSIS — N3001 Acute cystitis with hematuria: Secondary | ICD-10-CM | POA: Diagnosis not present

## 2018-11-01 DIAGNOSIS — R311 Benign essential microscopic hematuria: Secondary | ICD-10-CM

## 2018-11-01 LAB — URINALYSIS, ROUTINE W REFLEX MICROSCOPIC
Bilirubin Urine: NEGATIVE
Ketones, ur: NEGATIVE
Leukocytes,Ua: NEGATIVE
Nitrite: NEGATIVE
Specific Gravity, Urine: 1.02 (ref 1.000–1.030)
Total Protein, Urine: NEGATIVE
Urine Glucose: NEGATIVE
Urobilinogen, UA: 0.2 (ref 0.0–1.0)
pH: 6 (ref 5.0–8.0)

## 2018-11-01 LAB — POC URINALSYSI DIPSTICK (AUTOMATED)
Bilirubin, UA: NEGATIVE
Blood, UA: POSITIVE
Glucose, UA: NEGATIVE
Ketones, UA: NEGATIVE
Leukocytes, UA: NEGATIVE
Nitrite, UA: NEGATIVE
Protein, UA: NEGATIVE
Spec Grav, UA: 1.02 (ref 1.010–1.025)
Urobilinogen, UA: 0.2 E.U./dL
pH, UA: 6 (ref 5.0–8.0)

## 2018-11-01 MED ORDER — NITROFURANTOIN MONOHYD MACRO 100 MG PO CAPS: 100.0000 mg | ORAL_CAPSULE | Freq: Two times a day (BID) | ORAL | 0 refills | Status: AC

## 2018-11-01 NOTE — Progress Notes (Signed)
Subjective:  Patient ID: Carrie [name], female    DOB: 1945-06-02  Age: 73 y.o. MRN: [id number]  CC: Urinary Tract Infection   HPI Carrie [name] presents for 2-day history of dysuria, urinary urgency, incomplete bladder emptying, and bladder discomfort.  Outpatient Medications Prior to Visit  Medication Sig Dispense Refill   apixaban (ELIQUIS) 5 MG TABS tablet Take 1 tablet (5 mg total) by mouth 2 (two) times daily. 60 tablet 8   CALCIUM PO Take 1 tablet by mouth daily.      Cholecalciferol (VITAMIN D3) 1000 UNITS CAPS Take 1 capsule by mouth daily.       ezetimibe (ZETIA) 10 MG tablet Take 1 tablet (10 mg total) by mouth daily. 90 tablet 3   fish oil-omega-3 fatty acids 1000 MG capsule Take 1 g by mouth daily.       fluticasone (FLONASE) 50 MCG/ACT nasal spray fluticasone propionate 50 mcg/actuation nasal spray,suspension  SPRAY 2 SPRAYS INTO EACH NOSTRIL EVERY DAY     levocetirizine (XYZAL) 5 MG tablet TAKE 1 TABLET BY MOUTH EVERY DAY IN THE EVENING 90 tablet 1   metoprolol succinate (TOPROL-XL) 25 MG 24 hr tablet Take 3 tablets (75 mg total) by mouth daily. Take with or immediately following a meal. 270 tablet 3   nitroGLYCERIN (NITROLINGUAL) 0.4 MG/SPRAY spray Place 1 spray under the tongue 3 times/day as needed-between meals & bedtime.     rosuvastatin (CRESTOR) 20 MG tablet Take 1 tablet (20 mg total) by mouth daily. 90 tablet 3   Thiamine HCl (VITAMIN B-1) 250 MG tablet Take 500 mg by mouth daily.       vitamin B-12 (CYANOCOBALAMIN) 1000 MCG tablet Take 1,000 mcg by mouth daily.     No facility-administered medications prior to visit.     ROS Review of Systems  Constitutional: Negative for chills, fatigue and fever.  HENT: Negative.   Eyes: Negative for visual disturbance.  Respiratory: Negative.  Negative for cough, chest tightness, shortness of breath and wheezing.   Cardiovascular: Negative.  Negative for chest pain, palpitations and leg swelling.    Gastrointestinal: Negative for abdominal pain, constipation, diarrhea, nausea and vomiting.  Endocrine: Negative.   Genitourinary: Positive for difficulty urinating, dysuria, frequency, pelvic pain and urgency. Negative for decreased urine volume, flank pain, hematuria, vaginal bleeding and vaginal discharge.  Musculoskeletal: Negative.  Negative for back pain and myalgias.  Skin: Negative.   Neurological: Negative.  Negative for dizziness, weakness and light-headedness.  Hematological: Negative for adenopathy. Does not bruise/bleed easily.  Psychiatric/Behavioral: Negative.     Objective:  BP 140/64 (BP Location: Left Arm, Patient Position: Sitting, Cuff Size: Normal)    Pulse 70    Temp 98.4 F (36.9 C) (Oral)    Resp 16    Ht 5\' 2"  (1.575 m)    Wt 129 lb 8 oz (58.7 kg)    SpO2 96%    BMI 23.69 kg/m   BP Readings from Last 3 Encounters:  11/01/18 140/64  08/23/18 126/75  06/30/18 126/70    Wt Readings from Last 3 Encounters:  11/01/18 129 lb 8 oz (58.7 kg)  08/23/18 128 lb 12.8 oz (58.4 kg)  06/30/18 130 lb (59 kg)    Physical Exam Constitutional:      General: She is not in acute distress.    Appearance: She is not ill-appearing, toxic-appearing or diaphoretic.  HENT:     Nose: Nose normal.     Mouth/Throat:     Mouth: Mucous  membranes are moist.  Eyes:     General: No scleral icterus.    Conjunctiva/sclera: Conjunctivae normal.  Neck:     Musculoskeletal: Normal range of motion. No neck rigidity.  Cardiovascular:     Rate and Rhythm: Normal rate and regular rhythm.     Heart sounds: Murmur present. Systolic murmur present with a grade of 2/6. No diastolic murmur. No gallop.   Pulmonary:     Effort: Pulmonary effort is normal.     Breath sounds: No stridor. No wheezing, rhonchi or rales.  Abdominal:     General: Abdomen is protuberant. Bowel sounds are normal. There is no distension.     Palpations: There is no hepatomegaly or splenomegaly.     Tenderness: There  is no abdominal tenderness. There is no right CVA tenderness or left CVA tenderness.  Musculoskeletal: Normal range of motion.     Right lower leg: No edema.     Left lower leg: No edema.  Skin:    General: Skin is warm and dry.     Coloration: Skin is not pale.  Neurological:     General: No focal deficit present.     Mental Status: She is alert.  Psychiatric:        Mood and Affect: Mood normal.        Behavior: Behavior normal.     Lab Results  Component Value Date   WBC 7.9 01/03/2018   HGB 14.6 01/03/2018   HCT 42.6 01/03/2018   PLT 344.0 01/03/2018   GLUCOSE 86 01/03/2018   CHOL 173 08/24/2018   TRIG 158 (H) 08/24/2018   HDL 80 08/24/2018   LDLDIRECT 133.8 12/19/2012   LDLCALC 61 08/24/2018   ALT 18 08/24/2018   AST 19 01/03/2018   NA 139 01/03/2018   K 4.3 01/03/2018   CL 103 01/03/2018   CREATININE 0.63 01/03/2018   BUN 14 01/03/2018   CO2 28 01/03/2018   TSH 0.79 01/20/2016    Ct Abdomen W Wo Contrast  Result Date: 02/09/2018 CLINICAL DATA:  73 year old female with history of complex cyst in the left kidney noted on prior ultrasound examination. Follow-up evaluation. EXAM: CT ABDOMEN WITHOUT AND WITH CONTRAST TECHNIQUE: Multidetector CT imaging of the abdomen was performed following the standard protocol before and following the bolus administration of intravenous contrast. CONTRAST:  148mL ISOVUE-300 IOPAMIDOL (ISOVUE-300) INJECTION 61% COMPARISON:  No priors.  Abdominal ultrasound 01/23/2018. FINDINGS: Lower chest: Mild cardiomegaly. Severe calcifications of the aortic valve and mitral annulus. Atherosclerotic calcifications in the descending thoracic aorta as well as the left circumflex and right coronary arteries. Hepatobiliary: No suspicious cystic or solid hepatic lesions. No intra or extrahepatic biliary ductal dilatation. Gallbladder is normal in appearance. Pancreas: No pancreatic mass. No pancreatic ductal dilatation. No pancreatic or peripancreatic fluid  or inflammatory changes. Spleen: Unremarkable. Adrenals/Urinary Tract: No calcifications are identified within the collecting system of either kidney. Subcentimeter low-attenuation lesions are noted in the kidneys bilaterally measuring 5 mm or less in size, too small to characterize, but statistically likely to represent tiny cysts. No larger more suspicious appearing renal lesions. No hydroureteronephrosis in the visualized portions of the abdomen. Bilateral adrenal glands are normal in appearance. Stomach/Bowel: Normal appearance of the stomach. No pathologic dilatation of visualized portions of small bowel or colon. Vascular/Lymphatic: Aortic atherosclerosis, without evidence of aneurysm or dissection in the abdominal vasculature. No lymphadenopathy noted in the abdomen. Other: No significant volume of ascites and no pneumoperitoneum noted in the visualized portions of the  peritoneal cavity. Musculoskeletal: Multiple chronic appearing vertebral body compression fractures at T8, T11, L1, L2, L3, L4 and L5, most severe at T8 where there is 70% loss of anterior vertebral body height. There are no aggressive appearing lytic or blastic lesions noted in the visualized portions of the skeleton. IMPRESSION: 1. The only renal lesions identified measure 5 mm or less in size and are too small to characterize by CT, but have an appearance most compatible with tiny cysts, strongly favored to be benign. If there is a history of hematuria, these could be definitively characterized with MRI of the abdomen with and without IV gadolinium if clinically appropriate. 2. Aortic atherosclerosis, in addition to least 2 vessel coronary artery disease. Assessment for potential risk factor modification, dietary therapy or pharmacologic therapy may be warranted, if clinically indicated. 3. There are severe calcifications of the aortic valve and mitral annulus. Echocardiographic correlation for evaluation of potential valvular dysfunction  may be warranted if clinically indicated. 4. Additional incidental findings, as above. Electronically Signed   By: Vinnie Langton M.D.   On: 02/09/2018 07:26    Assessment & Plan:   Chelsae was seen today for urinary tract infection.  Diagnoses and all orders for this visit:  Benign essential microscopic hematuria  Hematuria due to acute cystitis- She has symptoms consistent with acute bacterial cystitis and there is blood in her urine.  Urine culture is pending.  I will empirically treat her with a 5-day course of nitrofurantoin. -     Urinalysis, Routine w reflex microscopic; Future -     CULTURE, URINE COMPREHENSIVE; Future -     nitrofurantoin, macrocrystal-monohydrate, (MACROBID) 100 MG capsule; Take 1 capsule (100 mg total) by mouth 2 (two) times daily for 5 days.   I am having [place]. [place] start on nitrofurantoin (macrocrystal-monohydrate). I am also having her maintain her fish oil-omega-3 fatty acids, vitamin B-1, Vitamin D3, CALCIUM PO, nitroGLYCERIN, vitamin B-12, rosuvastatin, apixaban, ezetimibe, fluticasone, metoprolol succinate, and levocetirizine.  Meds ordered this encounter  Medications   nitrofurantoin, macrocrystal-monohydrate, (MACROBID) 100 MG capsule    Sig: Take 1 capsule (100 mg total) by mouth 2 (two) times daily for 5 days.    Dispense:  10 capsule    Refill:  0     Follow-up: Return if symptoms worsen or fail to improve.  Scarlette Calico, MD

## 2018-11-01 NOTE — Patient Instructions (Signed)

## 2018-11-03 LAB — CULTURE, URINE COMPREHENSIVE
MICRO NUMBER:: 645990
SPECIMEN QUALITY:: ADEQUATE

## 2018-11-06 DIAGNOSIS — Z961 Presence of intraocular lens: Secondary | ICD-10-CM | POA: Diagnosis not present

## 2018-11-13 DIAGNOSIS — H903 Sensorineural hearing loss, bilateral: Secondary | ICD-10-CM | POA: Diagnosis not present

## 2018-11-22 DIAGNOSIS — L820 Inflamed seborrheic keratosis: Secondary | ICD-10-CM | POA: Diagnosis not present

## 2018-12-06 DIAGNOSIS — R69 Illness, unspecified: Secondary | ICD-10-CM | POA: Diagnosis not present

## 2019-01-04 ENCOUNTER — Ambulatory Visit (INDEPENDENT_AMBULATORY_CARE_PROVIDER_SITE_OTHER): Payer: Medicare HMO | Admitting: Internal Medicine

## 2019-01-04 ENCOUNTER — Other Ambulatory Visit (INDEPENDENT_AMBULATORY_CARE_PROVIDER_SITE_OTHER): Payer: Medicare HMO

## 2019-01-04 ENCOUNTER — Other Ambulatory Visit: Payer: Self-pay

## 2019-01-04 ENCOUNTER — Encounter: Payer: Self-pay | Admitting: Internal Medicine

## 2019-01-04 VITALS — BP 130/80 | HR 99 | Temp 97.8°F | Resp 16 | Ht 62.0 in | Wt 133.0 lb

## 2019-01-04 DIAGNOSIS — K219 Gastro-esophageal reflux disease without esophagitis: Secondary | ICD-10-CM

## 2019-01-04 DIAGNOSIS — R1313 Dysphagia, pharyngeal phase: Secondary | ICD-10-CM

## 2019-01-04 DIAGNOSIS — Z1231 Encounter for screening mammogram for malignant neoplasm of breast: Secondary | ICD-10-CM | POA: Insufficient documentation

## 2019-01-04 DIAGNOSIS — R350 Frequency of micturition: Secondary | ICD-10-CM

## 2019-01-04 DIAGNOSIS — N3281 Overactive bladder: Secondary | ICD-10-CM | POA: Diagnosis not present

## 2019-01-04 DIAGNOSIS — Z23 Encounter for immunization: Secondary | ICD-10-CM | POA: Diagnosis not present

## 2019-01-04 LAB — URINALYSIS, ROUTINE W REFLEX MICROSCOPIC
Bilirubin Urine: NEGATIVE
Ketones, ur: NEGATIVE
Leukocytes,Ua: NEGATIVE
Nitrite: NEGATIVE
Specific Gravity, Urine: >=1.03 — AB (ref 1.000–1.030)
Total Protein, Urine: NEGATIVE
Urine Glucose: NEGATIVE
Urobilinogen, UA: 0.2 (ref 0.0–1.0)
pH: 5.5 (ref 5.0–8.0)

## 2019-01-04 NOTE — Progress Notes (Signed)
Subjective:  Patient ID: Carrie [name], female    DOB: 1946/03/23  Age: 73 y.o. MRN: [id number]  CC: Abdominal Pain   HPI Carrie [name] presents for f/up - She complains of a 3-week history of tightness in the epigastric region with early satiety and trouble swallowing.  Outpatient Medications Prior to Visit  Medication Sig Dispense Refill   apixaban (ELIQUIS) 5 MG TABS tablet Take 1 tablet (5 mg total) by mouth 2 (two) times daily. 60 tablet 8   CALCIUM PO Take 1 tablet by mouth daily.      Cholecalciferol (VITAMIN D3) 1000 UNITS CAPS Take 1 capsule by mouth daily.       ezetimibe (ZETIA) 10 MG tablet Take 1 tablet (10 mg total) by mouth daily. 90 tablet 3   fish oil-omega-3 fatty acids 1000 MG capsule Take 1 g by mouth daily.       fluticasone (FLONASE) 50 MCG/ACT nasal spray fluticasone propionate 50 mcg/actuation nasal spray,suspension  SPRAY 2 SPRAYS INTO EACH NOSTRIL EVERY DAY     levocetirizine (XYZAL) 5 MG tablet TAKE 1 TABLET BY MOUTH EVERY DAY IN THE EVENING 90 tablet 1   metoprolol succinate (TOPROL-XL) 25 MG 24 hr tablet Take 3 tablets (75 mg total) by mouth daily. Take with or immediately following a meal. 270 tablet 3   nitroGLYCERIN (NITROLINGUAL) 0.4 MG/SPRAY spray Place 1 spray under the tongue 3 times/day as needed-between meals & bedtime.     rosuvastatin (CRESTOR) 20 MG tablet Take 1 tablet (20 mg total) by mouth daily. 90 tablet 3   Thiamine HCl (VITAMIN B-1) 250 MG tablet Take 500 mg by mouth daily.       vitamin B-12 (CYANOCOBALAMIN) 1000 MCG tablet Take 1,000 mcg by mouth daily.     No facility-administered medications prior to visit.     ROS Review of Systems  Constitutional: Negative for diaphoresis, fatigue and unexpected weight change.  HENT: Positive for trouble swallowing. Negative for sore throat and voice change.   Respiratory: Negative for cough, chest tightness, shortness of breath and wheezing.   Cardiovascular: Negative for chest  pain, palpitations and leg swelling.  Gastrointestinal: Positive for abdominal pain. Negative for blood in stool, constipation, diarrhea, nausea and vomiting.  Genitourinary: Positive for frequency. Negative for decreased urine volume, difficulty urinating, flank pain, hematuria, pelvic pain and urgency.  Musculoskeletal: Negative.  Negative for arthralgias.  Skin: Negative.  Negative for color change.  Neurological: Negative for dizziness, weakness and light-headedness.  Hematological: Negative for adenopathy. Does not bruise/bleed easily.  Psychiatric/Behavioral: Negative.     Objective:  BP 130/80 (BP Location: Left Arm, Patient Position: Sitting, Cuff Size: Normal)    Pulse 99    Temp 97.8 F (36.6 C) (Oral)    Resp 16    Ht 5\' 2"  (1.575 m)    Wt 133 lb (60.3 kg)    SpO2 96%    BMI 24.33 kg/m   BP Readings from Last 3 Encounters:  01/04/19 130/80  11/01/18 140/64  08/23/18 126/75    Wt Readings from Last 3 Encounters:  01/04/19 133 lb (60.3 kg)  11/01/18 129 lb 8 oz (58.7 kg)  08/23/18 128 lb 12.8 oz (58.4 kg)    Physical Exam Constitutional:      General: She is not in acute distress.    Appearance: She is not ill-appearing, toxic-appearing or diaphoretic.  HENT:     Mouth/Throat:     Mouth: Mucous membranes are moist.  Pharynx: No pharyngeal swelling or oropharyngeal exudate.  Eyes:     General: No scleral icterus.    Conjunctiva/sclera: Conjunctivae normal.  Neck:     Musculoskeletal: Normal range of motion and neck supple.  Cardiovascular:     Rate and Rhythm: Normal rate and regular rhythm.     Heart sounds: Murmur present. Systolic murmur present with a grade of 2/6. No diastolic murmur. No gallop.   Pulmonary:     Effort: Pulmonary effort is normal.     Breath sounds: No stridor. No wheezing, rhonchi or rales.  Abdominal:     General: Abdomen is flat. Bowel sounds are normal. There is no distension.     Palpations: There is no hepatomegaly, splenomegaly  or mass.     Tenderness: There is no abdominal tenderness.  Musculoskeletal:     Right lower leg: No edema.     Left lower leg: No edema.  Skin:    General: Skin is warm and dry.     Coloration: Skin is not pale.  Neurological:     General: No focal deficit present.     Mental Status: She is alert.  Psychiatric:        Mood and Affect: Mood normal.        Behavior: Behavior normal.     Lab Results  Component Value Date   WBC 7.9 01/03/2018   HGB 14.6 01/03/2018   HCT 42.6 01/03/2018   PLT 344.0 01/03/2018   GLUCOSE 86 01/03/2018   CHOL 173 08/24/2018   TRIG 158 (H) 08/24/2018   HDL 80 08/24/2018   LDLDIRECT 133.8 12/19/2012   LDLCALC 61 08/24/2018   ALT 18 08/24/2018   AST 19 01/03/2018   NA 139 01/03/2018   K 4.3 01/03/2018   CL 103 01/03/2018   CREATININE 0.63 01/03/2018   BUN 14 01/03/2018   CO2 28 01/03/2018   TSH 0.79 01/20/2016    Ct Abdomen W Wo Contrast  Result Date: 02/09/2018 CLINICAL DATA:  73 year old female with history of complex cyst in the left kidney noted on prior ultrasound examination. Follow-up evaluation. EXAM: CT ABDOMEN WITHOUT AND WITH CONTRAST TECHNIQUE: Multidetector CT imaging of the abdomen was performed following the standard protocol before and following the bolus administration of intravenous contrast. CONTRAST:  119mL ISOVUE-300 IOPAMIDOL (ISOVUE-300) INJECTION 61% COMPARISON:  No priors.  Abdominal ultrasound 01/23/2018. FINDINGS: Lower chest: Mild cardiomegaly. Severe calcifications of the aortic valve and mitral annulus. Atherosclerotic calcifications in the descending thoracic aorta as well as the left circumflex and right coronary arteries. Hepatobiliary: No suspicious cystic or solid hepatic lesions. No intra or extrahepatic biliary ductal dilatation. Gallbladder is normal in appearance. Pancreas: No pancreatic mass. No pancreatic ductal dilatation. No pancreatic or peripancreatic fluid or inflammatory changes. Spleen: Unremarkable.  Adrenals/Urinary Tract: No calcifications are identified within the collecting system of either kidney. Subcentimeter low-attenuation lesions are noted in the kidneys bilaterally measuring 5 mm or less in size, too small to characterize, but statistically likely to represent tiny cysts. No larger more suspicious appearing renal lesions. No hydroureteronephrosis in the visualized portions of the abdomen. Bilateral adrenal glands are normal in appearance. Stomach/Bowel: Normal appearance of the stomach. No pathologic dilatation of visualized portions of small bowel or colon. Vascular/Lymphatic: Aortic atherosclerosis, without evidence of aneurysm or dissection in the abdominal vasculature. No lymphadenopathy noted in the abdomen. Other: No significant volume of ascites and no pneumoperitoneum noted in the visualized portions of the peritoneal cavity. Musculoskeletal: Multiple chronic appearing vertebral body compression  fractures at T8, T11, L1, L2, L3, L4 and L5, most severe at T8 where there is 70% loss of anterior vertebral body height. There are no aggressive appearing lytic or blastic lesions noted in the visualized portions of the skeleton. IMPRESSION: 1. The only renal lesions identified measure 5 mm or less in size and are too small to characterize by CT, but have an appearance most compatible with tiny cysts, strongly favored to be benign. If there is a history of hematuria, these could be definitively characterized with MRI of the abdomen with and without IV gadolinium if clinically appropriate. 2. Aortic atherosclerosis, in addition to least 2 vessel coronary artery disease. Assessment for potential risk factor modification, dietary therapy or pharmacologic therapy may be warranted, if clinically indicated. 3. There are severe calcifications of the aortic valve and mitral annulus. Echocardiographic correlation for evaluation of potential valvular dysfunction may be warranted if clinically indicated. 4.  Additional incidental findings, as above. Electronically Signed   By: Vinnie Langton M.D.   On: 02/09/2018 07:26    Assessment & Plan:   Desha was seen today for abdominal pain.  Diagnoses and all orders for this visit:  Need for influenza vaccination -     Flu Vaccine QUAD High Dose(Fluad)  Pharyngeal dysphagia- I recommended that she see GI to consider undergoing upper endoscopy to evaluate for pathology. -     Ambulatory referral to Gastroenterology  Urinary frequency- Her UA is positive for a few red blood cells but her urine culture is negative.  I think this is OAB. -     CULTURE, URINE COMPREHENSIVE; Future -     Urinalysis, Routine w reflex microscopic; Future  Visit for screening mammogram -     MM DIGITAL SCREENING BILATERAL; Future  GERD without esophagitis- I am concerned her symptoms may be related to acid reflux so I have asked her to start taking a PPI. -     omeprazole (PRILOSEC) 40 MG capsule; Take 1 capsule (40 mg total) by mouth daily.  OAB (overactive bladder) -     mirabegron ER (MYRBETRIQ) 25 MG TB24 tablet; Take 1 tablet (25 mg total) by mouth daily.   I am having [place]. [place] start on omeprazole and mirabegron ER. I am also having her maintain her fish oil-omega-3 fatty acids, vitamin B-1, Vitamin D3, CALCIUM PO, nitroGLYCERIN, vitamin B-12, rosuvastatin, apixaban, ezetimibe, fluticasone, metoprolol succinate, and levocetirizine.  Meds ordered this encounter  Medications   omeprazole (PRILOSEC) 40 MG capsule    Sig: Take 1 capsule (40 mg total) by mouth daily.    Dispense:  90 capsule    Refill:  1   mirabegron ER (MYRBETRIQ) 25 MG TB24 tablet    Sig: Take 1 tablet (25 mg total) by mouth daily.    Dispense:  90 tablet    Refill:  1     Follow-up: Return in about 3 weeks (around 01/25/2019).  Scarlette Calico, MD

## 2019-01-04 NOTE — Patient Instructions (Signed)
Abdominal Pain, Adult Abdominal pain can be caused by many things. Often, abdominal pain is not serious and it gets better with no treatment or by being treated at home. However, sometimes abdominal pain is serious. Your health care provider will do a medical history and a physical exam to try to determine the cause of your abdominal pain. Follow these instructions at home:  Take over-the-counter and prescription medicines only as told by your health care provider. Do not take a laxative unless told by your health care provider.  Drink enough fluid to keep your urine clear or pale yellow.  Watch your condition for any changes.  Keep all follow-up visits as told by your health care provider. This is important. Contact a health care provider if:  Your abdominal pain changes or gets worse.  You are not hungry or you lose weight without trying.  You are constipated or have diarrhea for more than 2-3 days.  You have pain when you urinate or have a bowel movement.  Your abdominal pain wakes you up at night.  Your pain gets worse with meals, after eating, or with certain foods.  You are throwing up and cannot keep anything down.  You have a fever. Get help right away if:  Your pain does not go away as soon as your health care provider told you to expect.  You cannot stop throwing up.  Your pain is only in areas of the abdomen, such as the right side or the left lower portion of the abdomen.  You have bloody or black stools, or stools that look like tar.  You have severe pain, cramping, or bloating in your abdomen.  You have signs of dehydration, such as: ? Dark urine, very little urine, or no urine. ? Cracked lips. ? Dry mouth. ? Sunken eyes. ? Sleepiness. ? Weakness. This information is not intended to replace advice given to you by your health care provider. Make sure you discuss any questions you have with your health care provider. Document Released: 01/20/2005 Document  Revised: 10/31/2015 Document Reviewed: 09/24/2015 Elsevier Interactive Patient Education  2020 [employer]  

## 2019-01-05 DIAGNOSIS — K219 Gastro-esophageal reflux disease without esophagitis: Secondary | ICD-10-CM | POA: Insufficient documentation

## 2019-01-05 MED ORDER — OMEPRAZOLE 40 MG PO CPDR: 40.0000 mg | DELAYED_RELEASE_CAPSULE | Freq: Every day | ORAL | 1 refills | Status: DC

## 2019-01-06 LAB — CULTURE, URINE COMPREHENSIVE
MICRO NUMBER:: 866475
SPECIMEN QUALITY:: ADEQUATE

## 2019-01-06 MED ORDER — MIRABEGRON ER 25 MG PO TB24: 25.0000 mg | ORAL_TABLET | Freq: Every day | ORAL | 1 refills | Status: DC

## 2019-01-08 ENCOUNTER — Telehealth: Payer: Self-pay | Admitting: Internal Medicine

## 2019-01-08 NOTE — Telephone Encounter (Signed)
Spoke to pt and she stated that the medication Mybetriq was over $100 per month and she is not able to afford at this time. I stated understanding. Pt stated that she was able to deal with the urgency and frequency more than she could deal with paying that much for the medication.

## 2019-01-08 NOTE — Telephone Encounter (Signed)
Patient called about her lab results. She stated she had some medication called into her pharmacy but couldn't afford this medication. She would like a call back to discuss this issue

## 2019-01-29 ENCOUNTER — Encounter: Payer: Self-pay | Admitting: Physician Assistant

## 2019-01-29 ENCOUNTER — Telehealth: Payer: Self-pay

## 2019-01-29 ENCOUNTER — Ambulatory Visit: Payer: Medicare HMO | Admitting: Physician Assistant

## 2019-01-29 VITALS — BP 140/70 | HR 64 | Temp 98.1°F | Ht <= 58 in | Wt 133.5 lb

## 2019-01-29 DIAGNOSIS — R1013 Epigastric pain: Secondary | ICD-10-CM

## 2019-01-29 DIAGNOSIS — R6881 Early satiety: Secondary | ICD-10-CM

## 2019-01-29 DIAGNOSIS — R1310 Dysphagia, unspecified: Secondary | ICD-10-CM | POA: Diagnosis not present

## 2019-01-29 NOTE — Telephone Encounter (Signed)
[facility] Pre-operative Risk Assessment     Request for surgical clearance:     Endoscopy Procedure  What type of surgery is being performed?     EGD  When is this surgery scheduled?     02/09/19  What type of clearance is required ?   Pharmacy  Are there any medications that need to be held prior to surgery and how long? HOLD ELIQUIS TWO DAYS PRIOR  Practice name and name of physician performing surgery?      [facility]/ Dr. Silverio Decamp  What is your office phone and fax number?      Phone- [phone]  [phone]  Anesthesia type (None, local, MAC, general) ?       MAC ATN: Dr. Meda Coffee

## 2019-01-29 NOTE — Telephone Encounter (Signed)
   Primary Cardiologist: [name], MD  Chart reviewed as part of pre-operative protocol coverage. Given past medical history and time since last visit, based on ACC/AHA guidelines, Carrie [name] would be at acceptable risk for the planned procedure without further cardiovascular testing.   Patient with diagnosis of atrial flutter on Eliquis for anticoagulation with a CHADS2-VASc score of  5 (CHF, HTN, AGE,  CAD, female) and a CrCl 59 ml/min  Per office protocol, patient can hold Eliquis for 2 days prior to procedure.     I will route this recommendation to the requesting party via Epic fax function and remove from pre-op pool.  Please call with questions.  Kathyrn Drown, NP 01/29/2019, 2:48 PM

## 2019-01-29 NOTE — Patient Instructions (Addendum)
If you are age 73 or older, your body mass index should be between 23-30. Your Body mass index is 28.39 kg/m. If this is out of the aforementioned range listed, please consider follow up with your Primary Care Provider.  If you are age 76 or younger, your body mass index should be between 19-25. Your Body mass index is 28.39 kg/m. If this is out of the aformentioned range listed, please consider follow up with your Primary Care Provider.   You have been scheduled for an endoscopy. Please follow written instructions given to you at your visit today. If you use inhalers (even only as needed), please bring them with you on the day of your procedure. Your physician has requested that you go to www.startemmi.com and enter the access code given to you at your visit today. This web site gives a general overview about your procedure. However, you should still follow specific instructions given to you by our office regarding your preparation for the procedure.  You will be contacted by our office prior to your procedure for directions on holding your Eliquis.  If you do not hear from our office 1 week prior to your scheduled procedure, please call [phone] to discuss.  Continue Omeprazole 40 mg daily.    Thank you for choosing me and [facility].    Ellouise Newer, PA-C

## 2019-01-29 NOTE — Telephone Encounter (Signed)
Patient with diagnosis of aflutter on Eliquis for anticoagulation.    Procedure:  EGD  Date of procedure: 02/09/2019  CHADS2-VASc score of  5 (CHF, HTN, AGE,  CAD, female)  CrCl 59 ml/min  Per office protocol, patient can hold Eliquis for 2 days prior to procedure.

## 2019-01-29 NOTE — Progress Notes (Signed)
Chief Complaint: Dysphagia, epigastric pain  HPI:    Carrie [name] is a 73 year old female with a past medical history as listed below including CAD on Eliquis (01/25/2018 echo LVEF 55-60%), known to Dr. Silverio Decamp, who was referred to me by [name], MD for a complaint of dysphagia and epigastric pain.      04/15/2017 colonoscopy with one 2 mm polyp in the ascending colon, one 11 mm polyp at the ileocecal valve, five 5-9 mm polyps in the descending, transverse and ascending colon, moderate diverticulosis in the sigmoid, descending, transverse and ascending colon, nonbleeding internal hemorrhoids.  Repeat recommended in 3 years.    02/09/2018 CT abdomen pelvis with and without contrast with renal lesions, aortic atherosclerosis and severe calcifications of the aortic valve and mitral annulus.    01/04/2019 patient presented to PCP with 3-week history of tightness in the epigastric region with early satiety and trouble swallowing.  Patient started on Omeprazole 40 mg daily.    Today, the patient presents clinic and explains that for the past 3 to 4 weeks when she is out walking she feels as though is that there is a "knot/it is tight" right in her epigastrium.  This seems only to come on when she is exercising.      Also admits that she feels full very fast after eating and if she eats more than a half a sandwich or half of hamburger or small snack she feels bloated and like she is "overeating".  This has been going on for a few months.      Also describes dysphagia noting that when she chews and swallows she feels as though she has to swallow repeatedly in order to get food to go down her throat, as it feels like it gets hung in the middle.  Has been on Omeprazole 40 mg daily for the past month which has made little to no change in her symptoms.    Does admit to having some anxiety and worry over her family.    Denies fever, chills, weight loss, anorexia, nausea or vomiting.  Past Medical History:   Diagnosis Date  . Aortic stenosis   . Coronary artery disease    a. Prior LHC (07/24/03): LMCA heavily calcified without significant stenosis, LAD with heavy calcification proximally. 50-60% stenosis just distal to D2, LCx calcified proximally without significant stenosis, Dominant RCA with moderate proximal calcification and mild luminal irregularities throughout, LVEDP 12, LVEF 70%  . Diastolic dysfunction without heart failure   . Former tobacco use   . Former tobacco use   . HOCM (hypertrophic obstructive cardiomyopathy) ([place])   . Mild AI (aortic insufficiency)   . Osteoporosis, unspecified   . Other and unspecified hyperlipidemia   . Paroxysmal atrial flutter ([place])   . Pneumonia   . Wears hearing aid    left    Past Surgical History:  Procedure Laterality Date  . ATRIAL ABLATION SURGERY  2005  . BREAST SURGERY  1982   Benign tumor removed RT  . BUNIONECTOMY  04/07/2012   Procedure: Lillard Anes;  Surgeon: Alta Corning, MD;  Location: [facility];  Service: Orthopedics;  Laterality: Left;  CHEVRON OSTEOTOMY LEFT FOOT   . CARDIAC CATHETERIZATION  2005  . GANGLION CYST EXCISION     left wrist  . HERNIA REPAIR  1990   rt ing  . TONSILLECTOMY    . TUBAL LIGATION      Current Outpatient Medications  Medication Sig Dispense Refill  .  apixaban (ELIQUIS) 5 MG TABS tablet Take 1 tablet (5 mg total) by mouth 2 (two) times daily. 60 tablet 8  . CALCIUM PO Take 1 tablet by mouth daily.     . Cholecalciferol (VITAMIN D3) 1000 UNITS CAPS Take 1 capsule by mouth daily.      Marland Kitchen ezetimibe (ZETIA) 10 MG tablet Take 1 tablet (10 mg total) by mouth daily. 90 tablet 3  . fish oil-omega-3 fatty acids 1000 MG capsule Take 1 g by mouth daily.      . fluticasone (FLONASE) 50 MCG/ACT nasal spray fluticasone propionate 50 mcg/actuation nasal spray,suspension  SPRAY 2 SPRAYS INTO EACH NOSTRIL EVERY DAY    . levocetirizine (XYZAL) 5 MG tablet TAKE 1 TABLET BY MOUTH EVERY DAY IN THE  EVENING 90 tablet 1  . metoprolol succinate (TOPROL-XL) 25 MG 24 hr tablet Take 3 tablets (75 mg total) by mouth daily. Take with or immediately following a meal. 270 tablet 3  . omeprazole (PRILOSEC) 40 MG capsule Take 1 capsule (40 mg total) by mouth daily. 90 capsule 1  . rosuvastatin (CRESTOR) 20 MG tablet Take 1 tablet (20 mg total) by mouth daily. 90 tablet 3  . Thiamine HCl (VITAMIN B-1) 250 MG tablet Take 500 mg by mouth daily.      . vitamin B-12 (CYANOCOBALAMIN) 1000 MCG tablet Take 1,000 mcg by mouth daily.    . nitroGLYCERIN (NITROLINGUAL) 0.4 MG/SPRAY spray Place 1 spray under the tongue 3 times/day as needed-between meals & bedtime.     No current facility-administered medications for this visit.     Allergies as of 01/29/2019 - Review Complete 01/29/2019  Allergen Reaction Noted  . Codeine Nausea And Vomiting 03/11/2010  . Crestor [rosuvastatin] Other (See Comments) 06/23/2012  . Latex Rash     Family History  Problem Relation Age of Onset  . Heart disease Mother 8  . Congestive Heart Failure Mother   . Dementia Mother   . Hypertension Other   . Valvular heart disease Father        Had valves replaced  . Colon cancer Neg Hx   . Rectal cancer Neg Hx     Social History   Socioeconomic History  . Marital status: Divorced    Spouse name: Not on file  . Number of children: Not on file  . Years of education: Not on file  . Highest education level: Not on file  Occupational History  . Not on file  Social Needs  . Financial resource strain: Not on file  . Food insecurity    Worry: Not on file    Inability: Not on file  . Transportation needs    Medical: Not on file    Non-medical: Not on file  Tobacco Use  . Smoking status: Former Smoker    Quit date: 04/05/1999    Years since quitting: 19.8  . Smokeless tobacco: Never Used  . Tobacco comment: Regular Exercise-No  Substance and Sexual Activity  . Alcohol use: Yes    Alcohol/week: 7.0 standard drinks     Types: 7 Glasses of wine per week    Comment: 1 drink per week  . Drug use: No  . Sexual activity: Not on file  Lifestyle  . Physical activity    Days per week: Not on file    Minutes per session: Not on file  . Stress: Not on file  Relationships  . Social connections    Talks on phone: Not on file  Gets together: Not on file    Attends religious service: Not on file    Active member of club or organization: Not on file    Attends meetings of clubs or organizations: Not on file    Relationship status: Not on file  . Intimate partner violence    Fear of current or ex partner: Not on file    Emotionally abused: Not on file    Physically abused: Not on file    Forced sexual activity: Not on file  Other Topics Concern  . Not on file  Social History Narrative  . Not on file    Review of Systems:    Constitutional: No weight loss, fever or chills Cardiovascular: No chest pain Respiratory: No SOB  Gastrointestinal: See HPI and otherwise negative   Physical Exam:  Vital signs: Temp 98.1 F (36.7 C)   Ht 4' 9.5" (1.461 m) Comment: height measured without shoes  Wt 133 lb 8 oz (60.6 kg)   BMI 28.39 kg/m   Constitutional:   Pleasant Caucasian female appears to be in NAD, Well developed, Well nourished, alert and cooperative  Respiratory: Respirations even and unlabored. Lungs clear to auscultation bilaterally.   No wheezes, crackles, or rhonchi.  Cardiovascular: Normal S1, S2. No MRG. Regular rate and rhythm. No peripheral edema, cyanosis or pallor.  Gastrointestinal:  Soft, nondistended, nontender. No rebound or guarding. Normal bowel sounds. No appreciable masses or hepatomegaly. Rectal:  Not performed.  Psychiatric:  Demonstrates good judgement and reason without abnormal affect or behaviors.  No recent labs or imaging.  Assessment: 1.  Dysphagia: Food seems to get hung in the middle of her throat and she has to take multiple swallows to let it pass, no change with  Omeprazole over the past month; consider esophageal stricture versus ring versus web versus dysmotility 2.  Epigastric pain: Feels like a knot/tightness, mostly when exercising; consider relation to gastritis versus GERD versus cardiac 3.  Early satiety: Full very fast; consider gastritis versus PUD versus  Plan: 1.  Scheduled patient for an EGD with dilation in the [place] with Dr. Silverio Decamp.  Did discuss risks, benefits, limitations and alternatives and the patient agrees to proceed. 2.  Patient was advised to hold her Xarelto for 2 days prior to time of procedure.  We will communicate with her prescribing physician to ensure that holding her Xarelto is acceptable for her. 3.  Reviewed anti-dysphagia measures including taking small bites, drinking sips of water in between bites, avoiding distraction while eating and the chin tuck technique. 4.  Continue Omeprazole 40 mg daily for now. 5.  Patient to follow in clinic per recommendations from Dr. Silverio Decamp after time of procedure.  Ellouise Newer, PA-C [facility] 01/29/2019, 10:23 AM  Cc: [name], MD

## 2019-01-30 NOTE — Telephone Encounter (Signed)
Spoke with patient regarding holding her Eliquis two days prior to procedure.  Per Dr. Meda Coffee okay to hold.  Pt. Verbalized understanding.

## 2019-01-31 ENCOUNTER — Other Ambulatory Visit: Payer: Self-pay

## 2019-01-31 DIAGNOSIS — I4892 Unspecified atrial flutter: Secondary | ICD-10-CM

## 2019-02-01 MED ORDER — APIXABAN 5 MG PO TABS: 5.0000 mg | ORAL_TABLET | Freq: Two times a day (BID) | ORAL | 1 refills | Status: DC

## 2019-02-01 NOTE — Telephone Encounter (Signed)
Pt last saw Dr Meda Coffee 08/23/18 telemedicine visit Covid-19, last labs 01/03/18 Creat 0.63, overdue for labwork, there is an active order in [place] for labwork, pt has appt on 02/19/19 for labwork at [address] office. Age 73, weight 60.6kg, based on specified criteria pt is on appropriate dosage of Eliquis 5mg  BID. Will refill x 1 to get pt to lab appt. Will needs labwork for future refills.

## 2019-02-02 ENCOUNTER — Other Ambulatory Visit: Payer: Self-pay

## 2019-02-02 DIAGNOSIS — Z1159 Encounter for screening for other viral diseases: Secondary | ICD-10-CM

## 2019-02-02 NOTE — Progress Notes (Signed)
Reviewed and agree with documentation and assessment and plan. K. Veena Joyleen Haselton , MD   

## 2019-02-07 ENCOUNTER — Encounter: Payer: Self-pay | Admitting: Gastroenterology

## 2019-02-07 DIAGNOSIS — Z1159 Encounter for screening for other viral diseases: Secondary | ICD-10-CM | POA: Diagnosis not present

## 2019-02-07 LAB — SARS CORONAVIRUS 2 (TAT 6-24 HRS): SARS Coronavirus 2: NEGATIVE

## 2019-02-08 ENCOUNTER — Telehealth: Payer: Self-pay

## 2019-02-08 NOTE — Telephone Encounter (Signed)
Covid-19 screening questions   Do you now or have you had a fever in the last 14 days? NO   Do you have any respiratory symptoms of shortness of breath or cough now or in the last 14 days? NO  Do you have any family members or close contacts with diagnosed or suspected Covid-19 in the past 14 days? NO  Have you been tested for Covid-19 and found to be positive? NO        

## 2019-02-09 ENCOUNTER — Other Ambulatory Visit: Payer: Self-pay

## 2019-02-09 ENCOUNTER — Ambulatory Visit (AMBULATORY_SURGERY_CENTER): Payer: Medicare HMO | Admitting: Gastroenterology

## 2019-02-09 ENCOUNTER — Encounter: Payer: Self-pay | Admitting: Gastroenterology

## 2019-02-09 VITALS — BP 143/62 | HR 64 | Temp 97.7°F | Resp 22 | Ht <= 58 in | Wt 133.0 lb

## 2019-02-09 DIAGNOSIS — R1013 Epigastric pain: Secondary | ICD-10-CM

## 2019-02-09 DIAGNOSIS — R1310 Dysphagia, unspecified: Secondary | ICD-10-CM | POA: Diagnosis not present

## 2019-02-09 DIAGNOSIS — G8929 Other chronic pain: Secondary | ICD-10-CM

## 2019-02-09 MED ORDER — SODIUM CHLORIDE 0.9 % IV SOLN: 500.0000 mL | Freq: Once | INTRAVENOUS | Status: DC

## 2019-02-09 NOTE — Patient Instructions (Signed)
YOU MAY RESUME YOUR PREVIOUS DIET TODAY.  YOU MAY RESUME YOUR ELIQUIS ON TOMORROW 02/10/2019  ALL OTHER MEDICATIONS YOU CAN TAKE TODAY.   [place] YOU FOR ALLOWING [place] TO SERVE YOU TODAY!!!  YOU HAD AN ENDOSCOPIC PROCEDURE TODAY AT [facility]:   Refer to the procedure report that was given to you for any specific questions about what was found during the examination.  If the procedure report does not answer your questions, please call your gastroenterologist to clarify.  If you requested that your care partner not be given the details of your procedure findings, then the procedure report has been included in a sealed envelope for you to review at your convenience later.  YOU SHOULD EXPECT: Some feelings of bloating in the abdomen. Passage of more gas than usual.  Walking can help get rid of the air that was put into your GI tract during the procedure and reduce the bloating. If you had a lower endoscopy (such as a colonoscopy or flexible sigmoidoscopy) you may notice spotting of blood in your stool or on the toilet paper. If you underwent a bowel prep for your procedure, you may not have a normal bowel movement for a few days.  Please Note:  You might notice some irritation and congestion in your nose or some drainage.  This is from the oxygen used during your procedure.  There is no need for concern and it should clear up in a day or so.  SYMPTOMS TO REPORT IMMEDIATELY:    Following upper endoscopy (EGD)  Vomiting of blood or coffee ground material  New chest pain or pain under the shoulder blades  Painful or persistently difficult swallowing  New shortness of breath  Fever of 100F or higher  Black, tarry-looking stools  For urgent or emergent issues, a gastroenterologist can be reached at any hour by calling [phone].   DIET:  We do recommend a small meal at first, but then you may proceed to your regular diet.  Drink plenty of fluids but you should avoid alcoholic  beverages for 24 hours.  ACTIVITY:  You should plan to take it easy for the rest of today and you should NOT DRIVE or use heavy machinery until tomorrow (because of the sedation medicines used during the test).    FOLLOW UP: Our staff will call the number listed on your records 48-72 hours following your procedure to check on you and address any questions or concerns that you may have regarding the information given to you following your procedure. If we do not reach you, we will leave a message.  We will attempt to reach you two times.  During this call, we will ask if you have developed any symptoms of COVID 19. If you develop any symptoms (ie: fever, flu-like symptoms, shortness of breath, cough etc.) before then, please call [phone].  If you test positive for Covid 19 in the 2 weeks post procedure, please call and report this information to [place].    If any biopsies were taken you will be contacted by phone or by letter within the next 1-3 weeks.  Please call us at [phone] if you have not heard about the biopsies in 3 weeks.    SIGNATURES/CONFIDENTIALITY: You and/or your care partner have signed paperwork which will be entered into your electronic medical record.  These signatures attest to the fact that that the information above on your After Visit Summary has been reviewed and is understood.  Full  responsibility of the confidentiality of this discharge information lies with you and/or your care-partner.

## 2019-02-09 NOTE — Progress Notes (Signed)
A/ox3, pleased with MAC, report to RN 

## 2019-02-09 NOTE — Op Note (Addendum)
[facility] Patient Name: Carrie [name] Procedure Date: 02/09/2019 1:56 PM MRN: [id number] Endoscopist: Mauri Pole , MD Age: 73 Referring MD:  Date of Birth: Oct 25, 1945 Gender: Female Account #: [id number] Procedure:                Upper GI endoscopy Procedure:                Pre-Anesthesia Assessment:                           - Prior to the procedure, a History and Physical                            was performed, and patient medications and                            allergies were reviewed. The patient's tolerance of                            previous anesthesia was also reviewed. The risks                            and benefits of the procedure and the sedation                            options and risks were discussed with the patient.                            All questions were answered, and informed consent                            was obtained. Prior Anticoagulants: The patient                            last took Eliquis (apixaban) 2 days prior to the                            procedure. ASA Grade Assessment: II - A patient                            with mild systemic disease. After reviewing the                            risks and benefits, the patient was deemed in                            satisfactory condition to undergo the procedure.                           After obtaining informed consent, the endoscope was                            passed under direct vision. Throughout the  procedure, the patient's blood pressure, pulse, and                            oxygen saturations were monitored continuously. The                            Endoscope was introduced through the mouth, and                            advanced to the second part of duodenum. The upper                            GI endoscopy was accomplished without difficulty.                            The patient tolerated the procedure well. Scope  In: Scope Out: Findings:                 The Z-line was regular and was found 35 cm from the                            incisors.                           No endoscopic abnormality was evident in the                            esophagus to explain the patient's complaint of                            dysphagia. It was decided, however, to proceed with                            dilation of the lower third of the esophagus. A TTS                            dilator was passed through the scope. Dilation with                            a 15-16.5-18 mm balloon dilator was performed to 18                            mm. The dilation site was examined following                            endoscope reinsertion and showed no change.                           The stomach was normal.                           The examined duodenum was normal. Complications:            No immediate complications. Estimated Blood Loss:     Estimated blood loss  was minimal. Impression:               - Z-line regular, 35 cm from the incisors.                           - No endoscopic esophageal abnormality to explain                            patient's dysphagia. Esophagus dilated. Dilated.                           - Normal stomach.                           - Normal examined duodenum.                           - No specimens collected. Recommendation:           - Patient has a contact number available for                            emergencies. The signs and symptoms of potential                            delayed complications were discussed with the                            patient. Return to normal activities tomorrow.                            Written discharge instructions were provided to the                            patient.                           - Resume previous diet.                           - Continue present medications.                           - Modified barium swallow to evaluate for  possible                            oropharyngeal dysphagia                           - Return to my office at the next available                            appointment.                           - Resume Eliquis (apixaban) at prior dose tomorrow.  Refer to managing physician for further adjustment                            of therapy. Mauri Pole, MD 02/09/2019 2:27:44 PM This report has been signed electronically. Addendum Number: 1   Addendum Date: 02/12/2019 9:11:22 AM      Indication: Dysphagia, dyspepsia, epigastric abdominal pain. Mauri Pole, MD 02/12/2019 9:11:48 AM This report has been signed electronically.

## 2019-02-13 ENCOUNTER — Other Ambulatory Visit: Payer: Self-pay

## 2019-02-13 ENCOUNTER — Other Ambulatory Visit (HOSPITAL_COMMUNITY): Payer: Self-pay

## 2019-02-13 ENCOUNTER — Telehealth: Payer: Self-pay

## 2019-02-13 DIAGNOSIS — R1310 Dysphagia, unspecified: Secondary | ICD-10-CM

## 2019-02-13 DIAGNOSIS — R1313 Dysphagia, pharyngeal phase: Secondary | ICD-10-CM

## 2019-02-13 NOTE — Telephone Encounter (Signed)
Patient notified of the appointment for a modified barium swallow on 02/22/19 at 1:00pm in the [facility]. She is instructed to use valet parking, to go to the [employer] to be taken to the radiology department.

## 2019-02-13 NOTE — Telephone Encounter (Signed)
  Follow up Call-  Call back number 02/09/2019 04/15/2017  Post procedure Call Back phone  # [phone]  Permission to leave phone message Yes Yes  Some recent data might be hidden     Patient questions:  Do you have a fever, pain , or abdominal swelling? No. Pain Score  0 *  Have you tolerated food without any problems? Yes.    Have you been able to return to your normal activities? Yes.    Do you have any questions about your discharge instructions: Diet   No. Medications  No. Follow up visit  No.  Do you have questions or concerns about your Care? No.  Actions: * If pain score is 4 or above: No action needed, pain <4.  1. Have you developed a fever since your procedure? no  2.   Have you had an respiratory symptoms (SOB or cough) since your procedure? no  3.   Have you tested positive for COVID 19 since your procedure  no 4.   Have you had any family members/close contacts diagnosed with the COVID 19 since your procedure?  no  If yes to any of these questions please route to Joylene John, RN and Alphonsa Gin, Therapist, sports.

## 2019-02-19 ENCOUNTER — Other Ambulatory Visit: Payer: Medicare HMO

## 2019-02-20 ENCOUNTER — Telehealth: Payer: Self-pay | Admitting: *Deleted

## 2019-02-20 DIAGNOSIS — Z006 Encounter for examination for normal comparison and control in clinical research program: Secondary | ICD-10-CM

## 2019-02-20 NOTE — Telephone Encounter (Signed)
[place] EDUInformed Consent  Subject Name:Carrie [name]  Subject met inclusion and exclusion criteria. The informed consent form, study requirements and expectations were reviewed with the subject and questions and concerns were addressed prior to the signing of the consent form. The subject verbalized understanding of the trial requirements. The subject agreed to participate in the [place] and gave verbal consent to participate in the [place] 02/16/2019. The informed consent was obtained prior to performance of any protocol-specific procedures for the subject. A copy of the signed informed consent was mailedto the subject and a copy was placed in the subject's medical record.  Carrie [name].

## 2019-02-22 ENCOUNTER — Ambulatory Visit (HOSPITAL_COMMUNITY)
Admission: RE | Admit: 2019-02-22 | Discharge: 2019-02-22 | Disposition: A | Discharge status: Home / Self Care | Payer: Medicare HMO | Source: Ambulatory Visit | Attending: Gastroenterology | Admitting: Gastroenterology

## 2019-02-22 ENCOUNTER — Other Ambulatory Visit: Payer: Self-pay

## 2019-02-22 DIAGNOSIS — R1310 Dysphagia, unspecified: Secondary | ICD-10-CM | POA: Diagnosis not present

## 2019-02-22 DIAGNOSIS — R05 Cough: Secondary | ICD-10-CM | POA: Diagnosis not present

## 2019-03-02 ENCOUNTER — Ambulatory Visit: Payer: Medicare HMO | Admitting: Cardiology

## 2019-03-12 ENCOUNTER — Ambulatory Visit
Admission: RE | Admit: 2019-03-12 | Discharge: 2019-03-12 | Disposition: A | Discharge status: Home / Self Care | Payer: Medicare HMO | Source: Ambulatory Visit | Attending: Internal Medicine | Admitting: Internal Medicine

## 2019-03-12 ENCOUNTER — Other Ambulatory Visit: Payer: Self-pay

## 2019-03-12 ENCOUNTER — Other Ambulatory Visit: Payer: Self-pay | Admitting: Internal Medicine

## 2019-03-12 DIAGNOSIS — Z1231 Encounter for screening mammogram for malignant neoplasm of breast: Secondary | ICD-10-CM

## 2019-03-13 LAB — HM MAMMOGRAPHY

## 2019-03-16 ENCOUNTER — Encounter: Payer: Self-pay | Admitting: Gastroenterology

## 2019-03-16 ENCOUNTER — Ambulatory Visit: Payer: Medicare HMO | Admitting: Gastroenterology

## 2019-03-16 VITALS — BP 100/60 | HR 95 | Temp 98.4°F | Ht 60.0 in | Wt 128.0 lb

## 2019-03-16 DIAGNOSIS — K219 Gastro-esophageal reflux disease without esophagitis: Secondary | ICD-10-CM

## 2019-03-16 DIAGNOSIS — R1310 Dysphagia, unspecified: Secondary | ICD-10-CM | POA: Diagnosis not present

## 2019-03-16 NOTE — Progress Notes (Signed)
Carrie [name]    [id number]    1945/07/31  Primary Care Physician:Jones, Arvid Right, MD  Referring Physician: [name], MD [address],  [place] [address]   Chief complaint: Dysphagia HPI:  73 year old female with history of CAD, proximal A. fib, a flutter, hypertension, chronic GERD with complaints of persistent dysphagia.  EGD February 09, 2019: No endoscopic abnormality, esophagus empirically dilated with 18 mm balloon.  Normal stomach and duodenum  Denies any significant improvement after EGD with esophageal dilation.  She continues to have globus sensation and difficulty swallowing with both solids and liquids.  No abdominal pain, vomiting, change in bowel habits, melena or blood per rectum  Colonoscopy April 15, 2017 for positive Cologuard: Multiple sessile polyps [tubular adenomas] removed, largest 11 mm in size.  Recall colonoscopy in 3 years  Denies decreased appetite or significant weight loss  Family history negative from GI malignancy  Outpatient Encounter Medications as of 03/16/2019  Medication Sig  . apixaban (ELIQUIS) 5 MG TABS tablet Take 1 tablet (5 mg total) by mouth 2 (two) times daily. NEEDS LABWORK FOR FUTURE REFILLS  . CALCIUM PO Take 1 tablet by mouth daily.   . Cholecalciferol (VITAMIN D3) 1000 UNITS CAPS Take 1 capsule by mouth daily.    . fish oil-omega-3 fatty acids 1000 MG capsule Take 1 g by mouth daily.    . fluticasone (FLONASE) 50 MCG/ACT nasal spray fluticasone propionate 50 mcg/actuation nasal spray,suspension  SPRAY 2 SPRAYS INTO EACH NOSTRIL EVERY DAY  . metoprolol succinate (TOPROL-XL) 25 MG 24 hr tablet Take 25 mg by mouth daily.  . nitroGLYCERIN (NITROLINGUAL) 0.4 MG/SPRAY spray Place 1 spray under the tongue 3 times/day as needed-between meals & bedtime.  Marland Kitchen omeprazole (PRILOSEC) 40 MG capsule Take 1 capsule (40 mg total) by mouth daily.  . rosuvastatin (CRESTOR) 20 MG tablet Take 1  tablet (20 mg total) by mouth daily.  . Thiamine HCl (VITAMIN B-1) 250 MG tablet Take 500 mg by mouth daily.    . vitamin B-12 (CYANOCOBALAMIN) 1000 MCG tablet Take 1,000 mcg by mouth daily.  . [DISCONTINUED] ezetimibe (ZETIA) 10 MG tablet Take 1 tablet (10 mg total) by mouth daily. (Patient not taking: Reported on 02/09/2019)  . [DISCONTINUED] levocetirizine (XYZAL) 5 MG tablet TAKE 1 TABLET BY MOUTH EVERY DAY IN THE EVENING (Patient not taking: Reported on 02/09/2019)  . [DISCONTINUED] metoprolol succinate (TOPROL-XL) 25 MG 24 hr tablet Take 3 tablets (75 mg total) by mouth daily. Take with or immediately following a meal. (Patient not taking: Reported on 02/09/2019)   No facility-administered encounter medications on file as of 03/16/2019.     Allergies as of 03/16/2019 - Review Complete 03/16/2019  Allergen Reaction Noted  . Codeine Nausea And Vomiting 03/11/2010  . Crestor [rosuvastatin] Other (See Comments) 06/23/2012  . Latex Rash     Past Medical History:  Diagnosis Date  . Aortic stenosis   . Coronary artery disease    a. Prior LHC (07/24/03): LMCA heavily calcified without significant stenosis, LAD with heavy calcification proximally. 50-60% stenosis just distal to D2, LCx calcified proximally without significant stenosis, Dominant RCA with moderate proximal calcification and mild luminal irregularities throughout, LVEDP 12, LVEF 70%  . Diastolic dysfunction without heart failure   . Former tobacco use   . Former tobacco use   . Heart murmur   . HOCM (hypertrophic obstructive cardiomyopathy) ([place])   . Mild AI (aortic  insufficiency)   . Osteoporosis, unspecified   . Other and unspecified hyperlipidemia   . Paroxysmal atrial flutter ([place])   . Pneumonia   . Wears hearing aid    left    Past Surgical History:  Procedure Laterality Date  . ATRIAL ABLATION SURGERY  2005  . BREAST SURGERY  1982   Benign tumor removed RT  . BUNIONECTOMY  04/07/2012   Procedure:  Lillard Anes;  Surgeon: Alta Corning, MD;  Location: [facility];  Service: Orthopedics;  Laterality: Left;  CHEVRON OSTEOTOMY LEFT FOOT   . CARDIAC CATHETERIZATION  2005  . GANGLION CYST EXCISION     left wrist  . HERNIA REPAIR  1990   rt ing  . TONSILLECTOMY    . TUBAL LIGATION      Family History  Problem Relation Age of Onset  . Heart disease Mother 26  . Congestive Heart Failure Mother   . Dementia Mother   . Hypertension Other   . Valvular heart disease Father        Had valves replaced  . Colon cancer Neg Hx   . Rectal cancer Neg Hx     Social History   Socioeconomic History  . Marital status: Divorced    Spouse name: Not on file  . Number of children: 2  . Years of education: Not on file  . Highest education level: Not on file  Occupational History  . Occupation: retired  Scientific laboratory technician  . Financial resource strain: Not on file  . Food insecurity    Worry: Not on file    Inability: Not on file  . Transportation needs    Medical: Not on file    Non-medical: Not on file  Tobacco Use  . Smoking status: Former Smoker    Quit date: 04/05/1999    Years since quitting: 19.9  . Smokeless tobacco: Never Used  . Tobacco comment: Regular Exercise-No  Substance and Sexual Activity  . Alcohol use: Yes    Alcohol/week: 7.0 standard drinks    Types: 7 Glasses of wine per week    Comment: 1 drink per week  . Drug use: No  . Sexual activity: Not on file  Lifestyle  . Physical activity    Days per week: Not on file    Minutes per session: Not on file  . Stress: Not on file  Relationships  . Social Herbalist on phone: Not on file    Gets together: Not on file    Attends religious service: Not on file    Active member of club or organization: Not on file    Attends meetings of clubs or organizations: Not on file    Relationship status: Not on file  . Intimate partner violence    Fear of current or ex partner: Not on file    Emotionally  abused: Not on file    Physically abused: Not on file    Forced sexual activity: Not on file  Other Topics Concern  . Not on file  Social History Narrative  . Not on file      Review of systems: Review of Systems  Constitutional: Negative for fever and chills.  HENT: Negative.   Eyes: Negative for blurred vision.  Respiratory: Negative for cough, shortness of breath and wheezing.   Cardiovascular: Negative for chest pain and palpitations.  Gastrointestinal: as per HPI Genitourinary: Negative for dysuria, urgency, frequency and hematuria.  Musculoskeletal: Negative for myalgias, back pain  and joint pain.  Skin: Negative for itching and rash.  Neurological: Negative for dizziness, tremors, focal weakness, seizures and loss of consciousness.  Endo/Heme/Allergies: Positive for nose bleeds.  Psychiatric/Behavioral: Negative for depression, suicidal ideas and hallucinations.  All other systems reviewed and are negative.   Physical Exam: Vitals:   03/16/19 1324  BP: 100/60  Pulse: 95  Temp: 98.4 F (36.9 C)   Body mass index is 25 kg/m. Gen:      No acute distress HEENT:  EOMI, sclera anicteric Neck:     No masses; no thyromegaly Lungs:    Clear to auscultation bilaterally; normal respiratory effort CV:         Regular rate and rhythm; no murmurs Abd:      + bowel sounds; soft, non-tender; no palpable masses, no distension Ext:    No edema; adequate peripheral perfusion Skin:      Warm and dry; no rash Neuro: alert and oriented x 3 Psych: normal mood and affect  Data Reviewed:  Reviewed labs, radiology imaging, old records and pertinent past GI work up   Assessment and Plan/Recommendations:  73 year old female with history of hypertension, paroxysmal A. fib, CAD, chronic GERD with persistent symptoms of dysphagia No significant improvement s/p recent EGD with empiric esophageal dilation  We will schedule for barium esophagram to exclude any extrinsic compression or  significant esophageal dysmotility  Discussed antireflux measures Continue omeprazole 40 mg daily  Return after barium esophagram to discuss further management  25 minutes was spent face-to-face with the patient. Greater than 50% of the time used for counseling as well as treatment plan and follow-up. She had multiple questions which were answered to her satisfaction  K. Denzil Magnuson , MD    CC: [name], MD

## 2019-03-16 NOTE — Patient Instructions (Signed)
You have been scheduled for a Barium Esophogram at [facility] (1st floor of the hospital) on 03/21/2019 at 9:30am. Please arrive 15 minutes prior to your appointment for registration. Make certain not to have anything to eat or drink 4 hours prior to your test. If you need to reschedule for any reason, please contact radiology at [phone] to do so. __________________________________________________________________ A barium swallow is an examination that concentrates on views of the esophagus. This tends to be a double contrast exam (barium and two liquids which, when combined, create a gas to distend the wall of the oesophagus) or single contrast (non-ionic iodine based). The study is usually tailored to your symptoms so a good history is essential. Attention is paid during the study to the form, structure and configuration of the esophagus, looking for functional disorders (such as aspiration, dysphagia, achalasia, motility and reflux) EXAMINATION You may be asked to change into a gown, depending on the type of swallow being performed. A radiologist and radiographer will perform the procedure. The radiologist will advise you of the type of contrast selected for your procedure and direct you during the exam. You will be asked to stand, sit or lie in several different positions and to hold a small amount of fluid in your mouth before being asked to swallow while the imaging is performed .In some instances you may be asked to swallow barium coated marshmallows to assess the motility of a solid food bolus. The exam can be recorded as a digital or video fluoroscopy procedure. POST PROCEDURE It will take 1-2 days for the barium to pass through your system. To facilitate this, it is important, unless otherwise directed, to increase your fluids for the next 24-48hrs and to resume your normal diet.  This test typically takes about 30 minutes to  perform. __________________________________________________________________________________  If you are age 19 or older, your body mass index should be between 23-30. Your Body mass index is 25 kg/m. If this is out of the aforementioned range listed, please consider follow up with your Primary Care Provider.  If you are age 48 or younger, your body mass index should be between 19-25. Your Body mass index is 25 kg/m. If this is out of the aformentioned range listed, please consider follow up with your Primary Care Provider.    I appreciate the  opportunity to care for you  Thank You   Harl Bowie , MD

## 2019-03-19 ENCOUNTER — Other Ambulatory Visit: Payer: Medicare HMO

## 2019-03-19 ENCOUNTER — Other Ambulatory Visit: Payer: Self-pay

## 2019-03-19 ENCOUNTER — Other Ambulatory Visit: Payer: Medicare HMO | Admitting: *Deleted

## 2019-03-19 DIAGNOSIS — I10 Essential (primary) hypertension: Secondary | ICD-10-CM | POA: Diagnosis not present

## 2019-03-19 DIAGNOSIS — I2510 Atherosclerotic heart disease of native coronary artery without angina pectoris: Secondary | ICD-10-CM

## 2019-03-19 DIAGNOSIS — I480 Paroxysmal atrial fibrillation: Secondary | ICD-10-CM | POA: Diagnosis not present

## 2019-03-19 DIAGNOSIS — I422 Other hypertrophic cardiomyopathy: Secondary | ICD-10-CM | POA: Diagnosis not present

## 2019-03-19 DIAGNOSIS — I350 Nonrheumatic aortic (valve) stenosis: Secondary | ICD-10-CM | POA: Diagnosis not present

## 2019-03-19 DIAGNOSIS — E785 Hyperlipidemia, unspecified: Secondary | ICD-10-CM

## 2019-03-19 DIAGNOSIS — I2583 Coronary atherosclerosis due to lipid rich plaque: Secondary | ICD-10-CM | POA: Diagnosis not present

## 2019-03-19 DIAGNOSIS — E7800 Pure hypercholesterolemia, unspecified: Secondary | ICD-10-CM | POA: Diagnosis not present

## 2019-03-20 LAB — CBC WITH DIFFERENTIAL/PLATELET
Basophils Absolute: 0.1 10*3/uL (ref 0.0–0.2)
Basos: 1 %
EOS (ABSOLUTE): 0.1 10*3/uL (ref 0.0–0.4)
Eos: 1 %
Hematocrit: 36.8 % (ref 34.0–46.6)
Hemoglobin: 12.5 g/dL (ref 11.1–15.9)
Immature Grans (Abs): 0.1 10*3/uL (ref 0.0–0.1)
Immature Granulocytes: 1 %
Lymphocytes Absolute: 2.2 10*3/uL (ref 0.7–3.1)
Lymphs: 27 %
MCH: 29.8 pg (ref 26.6–33.0)
MCHC: 34 g/dL (ref 31.5–35.7)
MCV: 88 fL (ref 79–97)
Monocytes Absolute: 1.1 10*3/uL — ABNORMAL HIGH (ref 0.1–0.9)
Monocytes: 13 %
Neutrophils Absolute: 4.7 10*3/uL (ref 1.4–7.0)
Neutrophils: 57 %
Platelets: 515 10*3/uL — ABNORMAL HIGH (ref 150–450)
RBC: 4.2 x10E6/uL (ref 3.77–5.28)
RDW: 12.5 % (ref 11.7–15.4)
WBC: 8.2 10*3/uL (ref 3.4–10.8)

## 2019-03-20 LAB — COMPREHENSIVE METABOLIC PANEL
ALT: 14 IU/L (ref 0–32)
AST: 22 IU/L (ref 0–40)
Albumin/Globulin Ratio: 1 — ABNORMAL LOW (ref 1.2–2.2)
Albumin: 3.6 g/dL — ABNORMAL LOW (ref 3.7–4.7)
Alkaline Phosphatase: 85 IU/L (ref 39–117)
BUN/Creatinine Ratio: 9 — ABNORMAL LOW (ref 12–28)
BUN: 6 mg/dL — ABNORMAL LOW (ref 8–27)
Bilirubin Total: 0.5 mg/dL (ref 0.0–1.2)
CO2: 24 mmol/L (ref 20–29)
Calcium: 9 mg/dL (ref 8.7–10.3)
Chloride: 102 mmol/L (ref 96–106)
Creatinine, Ser: 0.67 mg/dL (ref 0.57–1.00)
GFR calc Af Amer: 101 mL/min/{1.73_m2} (ref >59)
GFR calc non Af Amer: 87 mL/min/{1.73_m2} (ref >59)
Globulin, Total: 3.5 g/dL (ref 1.5–4.5)
Glucose: 92 mg/dL (ref 65–99)
Potassium: 4 mmol/L (ref 3.5–5.2)
Sodium: 141 mmol/L (ref 134–144)
Total Protein: 7.1 g/dL (ref 6.0–8.5)

## 2019-03-20 LAB — LIPID PANEL
Chol/HDL Ratio: 2.7 ratio (ref 0.0–4.4)
Cholesterol, Total: 146 mg/dL (ref 100–199)
HDL: 54 mg/dL (ref >39)
LDL Chol Calc (NIH): 65 mg/dL (ref 0–99)
Triglycerides: 160 mg/dL — ABNORMAL HIGH (ref 0–149)
VLDL Cholesterol Cal: 27 mg/dL (ref 5–40)

## 2019-03-20 LAB — TSH: TSH: 0.707 u[IU]/mL (ref 0.450–4.500)

## 2019-03-20 LAB — HEMOGLOBIN A1C
Est. average glucose Bld gHb Est-mCnc: 114 mg/dL
Hgb A1c MFr Bld: 5.6 % (ref 4.8–5.6)

## 2019-03-21 ENCOUNTER — Ambulatory Visit (HOSPITAL_COMMUNITY): Payer: Medicare HMO

## 2019-03-21 ENCOUNTER — Encounter: Payer: Self-pay | Admitting: Gastroenterology

## 2019-03-28 ENCOUNTER — Ambulatory Visit: Payer: Medicare HMO | Admitting: Cardiology

## 2019-03-29 ENCOUNTER — Encounter: Payer: Self-pay | Admitting: Cardiology

## 2019-03-29 ENCOUNTER — Other Ambulatory Visit: Payer: Self-pay

## 2019-03-29 ENCOUNTER — Ambulatory Visit: Payer: Medicare HMO | Admitting: Cardiology

## 2019-03-29 VITALS — BP 116/68 | HR 96 | Ht 60.0 in | Wt 126.6 lb

## 2019-03-29 DIAGNOSIS — I350 Nonrheumatic aortic (valve) stenosis: Secondary | ICD-10-CM

## 2019-03-29 DIAGNOSIS — E785 Hyperlipidemia, unspecified: Secondary | ICD-10-CM

## 2019-03-29 DIAGNOSIS — I480 Paroxysmal atrial fibrillation: Secondary | ICD-10-CM

## 2019-03-29 DIAGNOSIS — I10 Essential (primary) hypertension: Secondary | ICD-10-CM | POA: Diagnosis not present

## 2019-03-29 DIAGNOSIS — I2510 Atherosclerotic heart disease of native coronary artery without angina pectoris: Secondary | ICD-10-CM

## 2019-03-29 NOTE — Progress Notes (Signed)
Cardiology Office Note:    Date:  03/29/2019   ID:  Carrie, [name] 1945/07/22, MRN [id number]  PCP:  [name], MD  Cardiologist:  [name], MD  Electrophysiologist:  None   Referring MD: [name], MD   Reason for visit: 6 months follow-up  History of Present Illness:    Carrie [name] is a 73 y.o. female with a hx of with history of nonobstructive CAD, hypertrophic cardiomyopathy, paroxysmal atrial flutter, and aortic stenosis (moderate by echo in 01/2016), who presents for follow-up of chest pain.    Echo in October 2019 showed normal LVEF with moderate AS, mild AI, and mild to moderate MR.  She transition to me from Dr End as he moved to [place]. She has been doing well, walking a mile/day with no changes with DOE or CP, no palpitations, dizziness or falls. No myalgias or bleeding. The only complain is fatigue since starting zetia.  03/29/2019 -the patient is coming after 6 months, she reports that she feels great, she is very active walking a mile a day and living independently.  She denies any chest pain shortness of breath, dyspnea on exertion, no lower extremity edema orthopnea proximal nocturnal dyspnea.  No dizziness or falls.   Past Medical History:  Diagnosis Date  . Aortic stenosis   . Coronary artery disease    a. Prior LHC (07/24/03): LMCA heavily calcified without significant stenosis, LAD with heavy calcification proximally. 50-60% stenosis just distal to D2, LCx calcified proximally without significant stenosis, Dominant RCA with moderate proximal calcification and mild luminal irregularities throughout, LVEDP 12, LVEF 70%  . Diastolic dysfunction without heart failure   . Former tobacco use   . Former tobacco use   . Heart murmur   . HOCM (hypertrophic obstructive cardiomyopathy) ([place])   . Mild AI (aortic insufficiency)   . Osteoporosis, unspecified   . Other and unspecified hyperlipidemia   . Paroxysmal atrial flutter  ([place])   . Pneumonia   . Wears hearing aid    left    Past Surgical History:  Procedure Laterality Date  . ATRIAL ABLATION SURGERY  2005  . BREAST SURGERY  1982   Benign tumor removed RT  . BUNIONECTOMY  04/07/2012   Procedure: Lillard Anes;  Surgeon: Alta Corning, MD;  Location: [facility];  Service: Orthopedics;  Laterality: Left;  CHEVRON OSTEOTOMY LEFT FOOT   . CARDIAC CATHETERIZATION  2005  . GANGLION CYST EXCISION     left wrist  . HERNIA REPAIR  1990   rt ing  . TONSILLECTOMY    . TUBAL LIGATION      Current Medications: Current Meds  Medication Sig  . apixaban (ELIQUIS) 5 MG TABS tablet Take 1 tablet (5 mg total) by mouth 2 (two) times daily. NEEDS LABWORK FOR FUTURE REFILLS  . CALCIUM PO Take 1 tablet by mouth daily.   . Cholecalciferol (VITAMIN D3) 1000 UNITS CAPS Take 1 capsule by mouth daily.    . fish oil-omega-3 fatty acids 1000 MG capsule Take 1 g by mouth daily.    . metoprolol succinate (TOPROL-XL) 25 MG 24 hr tablet Take 25 mg by mouth daily.  . nitroGLYCERIN (NITROLINGUAL) 0.4 MG/SPRAY spray Place 1 spray under the tongue every 5 (five) minutes x 3 doses as needed.   . rosuvastatin (CRESTOR) 20 MG tablet Take 1 tablet (20 mg total) by mouth daily.  . Thiamine HCl (VITAMIN B-1) 250 MG tablet Take 500 mg by mouth daily.    Marland Kitchen  vitamin B-12 (CYANOCOBALAMIN) 1000 MCG tablet Take 1,000 mcg by mouth daily.     Allergies:   Codeine, Crestor [rosuvastatin], and Latex   Social History   Socioeconomic History  . Marital status: Divorced    Spouse name: Not on file  . Number of children: 2  . Years of education: Not on file  . Highest education level: Not on file  Occupational History  . Occupation: retired  Scientific laboratory technician  . Financial resource strain: Not on file  . Food insecurity    Worry: Not on file    Inability: Not on file  . Transportation needs    Medical: Not on file    Non-medical: Not on file  Tobacco Use  . Smoking status:  Former Smoker    Quit date: 04/05/1999    Years since quitting: 19.9  . Smokeless tobacco: Never Used  . Tobacco comment: Regular Exercise-No  Substance and Sexual Activity  . Alcohol use: Yes    Alcohol/week: 7.0 standard drinks    Types: 7 Glasses of wine per week    Comment: 1 drink per week  . Drug use: No  . Sexual activity: Not on file  Lifestyle  . Physical activity    Days per week: Not on file    Minutes per session: Not on file  . Stress: Not on file  Relationships  . Social Herbalist on phone: Not on file    Gets together: Not on file    Attends religious service: Not on file    Active member of club or organization: Not on file    Attends meetings of clubs or organizations: Not on file    Relationship status: Not on file  Other Topics Concern  . Not on file  Social History Narrative  . Not on file     Family History: The patient's family history includes Congestive Heart Failure in her mother; Dementia in her mother; Heart disease (age of onset: 10) in her mother; Hypertension in an other family member; Valvular heart disease in her father. There is no history of Colon cancer or Rectal cancer.  ROS:   Please see the history of present illness.    All other systems reviewed and are negative.  EKGs/Labs/Other Studies Reviewed:    The following studies were reviewed today:  EKG:  EKG is ordered today.  The ekg ordered today demonstrates normal sinus rhythm, borderline LVH, otherwise normal unchanged from prior.  Recent Labs: 03/19/2019: ALT 14; BUN 6; Creatinine, Ser 0.67; Hemoglobin 12.5; Platelets 515; Potassium 4.0; Sodium 141; TSH 0.707  Recent Lipid Panel    Component Value Date/Time   CHOL 146 03/19/2019 1051   TRIG 160 (H) 03/19/2019 1051   HDL 54 03/19/2019 1051   CHOLHDL 2.7 03/19/2019 1051   CHOLHDL 2.3 08/18/2015 1030   VLDL 22 08/18/2015 1030   LDLCALC 65 03/19/2019 1051   LDLDIRECT 133.8 12/19/2012 0828    Physical Exam:     VS:  BP 116/68   Pulse 96   Ht 5' (1.524 m)   Wt 126 lb 9.6 oz (57.4 kg)   SpO2 96%   BMI 24.72 kg/m     Wt Readings from Last 3 Encounters:  03/29/19 126 lb 9.6 oz (57.4 kg)  03/16/19 128 lb (58.1 kg)  02/09/19 133 lb (60.3 kg)     GEN:  Well nourished, well developed in no acute distress HEENT: Normal NECK: No JVD; No carotid bruits LYMPHATICS: No lymphadenopathy  CARDIAC: RRR, 5 out of 6 holosystolic murmur without S2, rubs, gallops RESPIRATORY:  Clear to auscultation without rales, wheezing or rhonchi  ABDOMEN: Soft, non-tender, non-distended MUSCULOSKELETAL:  No edema; No deformity  SKIN: Warm and dry NEUROLOGIC:  Alert and oriented x 3 PSYCHIATRIC:  Normal affect   ASSESSMENT:    1. Moderate aortic stenosis   2. Paroxysmal atrial fibrillation (HCC)   3. Coronary artery disease involving native coronary artery of native heart without angina pectoris   4. Hyperlipidemia, unspecified hyperlipidemia type   5. Essential hypertension    PLAN:    In order of problems listed above:  Nonobstructive coronary artery disease No symptoms to suggest worsening coronary insufficiency.  Continue medical therapy to prevent progression of disease.  She is currently on Crestor and fish oil, her HDL and LDL are at goal, triglycerides are 160.  Atrial flutter No symptoms to suggest recurrence.  EKG today demonstrates sinus rhythm.  Continue metoprolol and apixaban. No bleeding.  Hemoglobin 12.5.  Aortic stenosis Moderate AS noted on recent echocardiogram, minimally increased from prior study in 10/2016 (mean gradient 25 -> 30 mmHg).    She continues to be asymptomatic and able to walk a mile a day, however now she is missing S2, she is informed about the symptoms and asked to let us know if she develops any exertional shortness of breath or chest pain or dizziness.  We will see her in person in 6 months and repeat echo prior to that visit.  Hypertrophic cardiomyopathy No syncope or  other symptoms.  I recommend continued beta-blockade with target heart rate in the 60s.  No further intervention at this time.  Hypertension Blood pressure well controlled.  Continue current medications.  Hyperlipidemia As above, normal LFTs.  Medication Adjustments/Labs and Tests Ordered: Current medicines are reviewed at length with the patient today.  Concerns regarding medicines are outlined above.  Orders Placed This Encounter  Procedures  . EKG 12-Lead  . ECHOCARDIOGRAM COMPLETE   No orders of the defined types were placed in this encounter.   There are no Patient Instructions on file for this visit.   Signed, [name], MD  03/29/2019 11:44 AM    [place]

## 2019-03-29 NOTE — Patient Instructions (Signed)
Medication Instructions:   Your physician recommends that you continue on your current medications as directed. Please refer to the Current Medication list given to you today.  *If you need a refill on your cardiac medications before your next appointment, please call your pharmacy*    Testing/Procedures:  Your physician has requested that you have an echocardiogram. Echocardiography is a painless test that uses sound waves to create images of your heart. It provides your doctor with information about the size and shape of your heart and how well your heart's chambers and valves are working. This procedure takes approximately one hour. There are no restrictions for this procedure.  SCHEDULING PATIENT NEEDS HER ECHO SCHEDULED TODAY FOR 5 1/2-6 MONTHS OUT-PRIOR TO HER 6 MONTH FOLLOW-UP APPOINTMENT WITH DR Meda Coffee    Follow-Up: At [facility], you and your health needs are our priority.  As part of our continuing mission to provide you with exceptional heart care, we have created designated Provider Care Teams.  These Care Teams include your primary Cardiologist (physician) and Advanced Practice Providers (APPs -  Physician Assistants and Nurse Practitioners) who all work together to provide you with the care you need, when you need it.  Your next appointment:   6 month(s)  The format for your next appointment:   In Person  Provider:   [name], MD  Other Instructions  --YOU WILL NEED YOUR ECHO DONE PRIOR TO THIS APPOINTMENT

## 2019-04-06 ENCOUNTER — Other Ambulatory Visit: Payer: Self-pay | Admitting: Cardiology

## 2019-04-06 DIAGNOSIS — I4892 Unspecified atrial flutter: Secondary | ICD-10-CM

## 2019-04-06 NOTE — Telephone Encounter (Signed)
Prescription refill request for Eliquis received.  Last office visit: Meda Coffee 03/29/2019 Scr: 0.67, 03/19/2019 Age: 73 y.o. Weight: 57.4 kg  Prescription refill sent.

## 2019-04-10 ENCOUNTER — Telehealth: Payer: Self-pay | Admitting: Internal Medicine

## 2019-04-10 NOTE — Telephone Encounter (Signed)
Insurance has been submitted and verified for Prolia. Patient is responsible for a $275 copay.  Called patient to schedule. She would like to wait until the first of the year.  Benefits will be re-run after January 1st for insurance coverage info.

## 2019-04-30 ENCOUNTER — Other Ambulatory Visit: Payer: Self-pay

## 2019-04-30 MED ORDER — ROSUVASTATIN CALCIUM 20 MG PO TABS: 20.0000 mg | ORAL_TABLET | Freq: Every day | ORAL | 3 refills | Status: DC

## 2019-05-09 DIAGNOSIS — Z961 Presence of intraocular lens: Secondary | ICD-10-CM | POA: Diagnosis not present

## 2019-05-09 DIAGNOSIS — H524 Presbyopia: Secondary | ICD-10-CM | POA: Diagnosis not present

## 2019-06-12 DIAGNOSIS — R69 Illness, unspecified: Secondary | ICD-10-CM | POA: Diagnosis not present

## 2019-06-13 NOTE — Telephone Encounter (Signed)
   Patient calling with questions regarding Prolia Please call

## 2019-06-18 NOTE — Telephone Encounter (Signed)
Appointment scheduled.

## 2019-06-27 ENCOUNTER — Ambulatory Visit: Payer: Medicare HMO

## 2019-07-02 ENCOUNTER — Other Ambulatory Visit: Payer: Self-pay

## 2019-07-02 ENCOUNTER — Ambulatory Visit (INDEPENDENT_AMBULATORY_CARE_PROVIDER_SITE_OTHER): Payer: Medicare HMO | Admitting: *Deleted

## 2019-07-02 DIAGNOSIS — M810 Age-related osteoporosis without current pathological fracture: Secondary | ICD-10-CM | POA: Diagnosis not present

## 2019-07-02 MED ORDER — DENOSUMAB 60 MG/ML ~~LOC~~ SOSY
60.0000 mg | PREFILLED_SYRINGE | Freq: Once | SUBCUTANEOUS | Status: AC
  Administered 2019-07-02: 14:00:00 60 mg via SUBCUTANEOUS

## 2019-07-02 NOTE — Progress Notes (Addendum)
I have reviewed and agree  Pls cosign for prolia inj.Marland KitchenJohny Chess

## 2019-07-17 ENCOUNTER — Telehealth: Payer: Self-pay | Admitting: Internal Medicine

## 2019-07-17 DIAGNOSIS — I2510 Atherosclerotic heart disease of native coronary artery without angina pectoris: Secondary | ICD-10-CM

## 2019-07-17 NOTE — Progress Notes (Signed)
  Chronic Care Management   Note  07/17/2019 Name: Carrie [name] MRN: [id number] DOB: 1945/07/31  Carrie [name] is a 74 y.o. year old female who is a primary care patient of [name], MD. I reached out to [employer] by phone today in response to a referral sent by Carrie [name]'s PCP, [name], MD.   Carrie [name] was given information about Chronic Care Management services today including:  1. CCM service includes personalized support from designated clinical staff supervised by her physician, including individualized plan of care and coordination with other care providers 2. 24/7 contact phone numbers for assistance for urgent and routine care needs. 3. Service will only be billed when office clinical staff spend 20 minutes or more in a month to coordinate care. 4. Only one practitioner may furnish and bill the service in a calendar month. 5. The patient may stop CCM services at any time (effective at the end of the month) by phone call to the office staff.   Patient agreed to services and verbal consent obtained.   Follow up plan:   Carrie [name]

## 2019-08-06 NOTE — Addendum Note (Signed)
Addended by: Aviva Signs M on: 08/06/2019 10:09 AM   Modules accepted: Orders

## 2019-08-07 NOTE — Chronic Care Management (AMB) (Signed)
Chronic Care Management Pharmacy  Name: Carrie [name]  MRN: [id number] DOB: 03/21/1946  Chief Complaint/ HPI  Annette Stable,  74 y.o. , female presents for their Initial CCM visit with the clinical pharmacist via telephone due to COVID-19 Pandemic.  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  Office Visits: 01/04/19 Dr Ronnald Ramp OV: epigastric pain and dysphagia. Started omeprazole 40mg  and referred to GI. UA positive but UCx negative, likely OAB, rx'd Myrbetriq. However med is expensive, pt is ok dealing with OAB w/o meds.  11/01/18 Dr Ronnald Ramp OV: UTI - rx'd Macrobid.  Consult Visit: 03/29/19 Dr Meda Coffee (cardiology): conditions stable, no med changes. Will repeat ECHO in 6 mos d/t aortic stenosis.  03/16/19 Dr Silverio Decamp (GI): dysphagia, EGD 01/2019 for dilation. Continue PPI.  Medications: Outpatient Encounter Medications as of 08/08/2019  Medication Sig  . CALCIUM PO Take 1 tablet by mouth daily.   . Cholecalciferol (VITAMIN D3) 1000 UNITS CAPS Take 1 capsule by mouth daily.    Marland Kitchen ELIQUIS 5 MG TABS tablet TAKE 1 TABLET (5 MG TOTAL) BY MOUTH 2 (TWO) TIMES DAILY. NEEDS LABWORK FOR FUTURE REFILLS  . fish oil-omega-3 fatty acids 1000 MG capsule Take 1 g by mouth daily.    . folic acid (FOLVITE) 1 MG tablet Take 1 mg by mouth daily.  . metoprolol succinate (TOPROL-XL) 25 MG 24 hr tablet Take 25 mg by mouth daily.  . nitroGLYCERIN (NITROLINGUAL) 0.4 MG/SPRAY spray Place 1 spray under the tongue every 5 (five) minutes x 3 doses as needed.   . rosuvastatin (CRESTOR) 20 MG tablet Take 1 tablet (20 mg total) by mouth daily.  . Thiamine HCl (VITAMIN B-1) 250 MG tablet Take 500 mg by mouth daily.    . vitamin B-12 (CYANOCOBALAMIN) 1000 MCG tablet Take 1,000 mcg by mouth daily.  Marland Kitchen omeprazole (PRILOSEC) 40 MG capsule Take 40 mg by  mouth daily.   No facility-administered encounter medications on file as of 08/08/2019.     Current Diagnosis/Assessment:  Goals Addressed            This Visit's Progress   . Pharmacy Care Plan       CARE PLAN ENTRY  Current Barriers:  . Chronic Disease Management support, education, and care coordination needs related to Atrial Fibrillation, HTN, and HLD  Pharmacist Clinical Goal(s):  Marland Kitchen Over the next 180 days, patient will work with pharmacist to reduce medication costs related to coverage gap  Interventions: . Comprehensive medication review performed. . Discussed patient assistance program for Eliquis during coverage gap  Patient Self Care Activities:  . Patient verbalizes understanding of plan to apply for assistance during donut hole, Self administers medications as prescribed, Calls pharmacy for medication refills, and Calls provider office for new concerns or questions  Initial goal documentation      SDOH Interventions     Most Recent Value  SDOH Interventions  SDOH Interventions for the Following Domains  Transportation  Transportation Interventions  Other (Comment) [pt drives to pharmacy for meds]      AFIB/Hypertension   Patient is currently rate controlled.   Office blood pressures are  BP Readings from Last 3 Encounters:  03/29/19 116/68  03/16/19 100/60  02/09/19 (!) 143/62   Kidney Function: Lab Results  Component Value Date   CREATININE 0.67 03/19/2019   CREATININE 0.63 01/03/2018  CREATININE 0.55 04/27/2017      Component Value Date/Time   GFR 98.54 01/03/2018 1120   GFRNONAA 87 03/19/2019 1051   GFRAA 101 03/19/2019 1051    Patrent checks BP at home infrequently  Patient home BP readings are ranging: "good"  Patient has failed these meds in past: n/a Patient is currently controlled on the following medications: metoprolol succinate 25 mg daily, Eliquis 5 mg BID  We discussed:  Pt reports some bruising with Eliquis, occasional  nosebleeds. It is $47/mo now, usually enters donut hole in September,will be $100/month. Discussed patient assistance for later in the year.  Plan  Continue current medications  Plan to pursue Eliquis PAP during donut hole  Hyperlipidemia/CAD   Lipid Panel     Component Value Date/Time   CHOL 146 03/19/2019 1051   TRIG 160 (H) 03/19/2019 1051   HDL 54 03/19/2019 1051   CHOLHDL 2.7 03/19/2019 1051   CHOLHDL 2.3 08/18/2015 1030   VLDL 22 08/18/2015 1030   LDLCALC 65 03/19/2019 1051   LDLDIRECT 133.8 12/19/2012 0828   LABVLDL 27 03/19/2019 1051    Hx nonobstructive CAD, cath without stents 2005.  Patient has failed these meds in past: n/a Patient is currently controlled on the following medications: rosuvastatin 20 mg daily hs, nitroglycerin 0.4 mg spray prn, fish oil OTC 1 g daily   We discussed:  diet and exercise extensively  Plan  Continue current medications and control with diet and exercise   Osteoporosis   Patient has failed these meds in past: n/a Patient is currently controlled on the following medications: Prolia  We discussed:  Last injection March 2021. Copay is reasonable per patient.  Plan  Continue current medications  GERD   Patient has failed these meds in past: omeprazole Patient is currently controlled on the following medications: none  We discussed:  Had esophageal dilation procedure done, PPI was not helping.  Plan  Continue to monitor symptoms   Health Maintenance   Patient is currently controlled on the following medications: calcium, Vitamin D 1000 IU, thiamine, Vitamin B12 1000 mcg  We discussed:  Patient is satisfied with current OTC regimen and denies issues  Plan  Continue current medications   Medication Management   [facility] Uses pill box Pt endorses 100% compliance  We discussed: [facility], she is not interested in delivery.  Plan  Continue current medication  management strategy     Follow up: 4 month phone visit   Charlene Brooke, PharmD Clinical Pharmacist [facility] at [facility] [phone]

## 2019-08-08 ENCOUNTER — Ambulatory Visit: Payer: Medicare HMO | Admitting: Pharmacist

## 2019-08-08 ENCOUNTER — Other Ambulatory Visit: Payer: Self-pay

## 2019-08-08 DIAGNOSIS — M810 Age-related osteoporosis without current pathological fracture: Secondary | ICD-10-CM

## 2019-08-08 DIAGNOSIS — I10 Essential (primary) hypertension: Secondary | ICD-10-CM

## 2019-08-08 DIAGNOSIS — E785 Hyperlipidemia, unspecified: Secondary | ICD-10-CM

## 2019-08-08 DIAGNOSIS — I480 Paroxysmal atrial fibrillation: Secondary | ICD-10-CM

## 2019-08-08 NOTE — Progress Notes (Signed)
I have reviewed and agree.

## 2019-08-08 NOTE — Patient Instructions (Addendum)
Visit Information  Thank you for meeting with me to discuss your medications! I look forward to working with you to achieve your health care goals. Below is a summary of what we talked about during the visit:  Goals Addressed            This Visit's Progress   . Pharmacy Care Plan       CARE PLAN ENTRY  Current Barriers:  . Chronic Disease Management support, education, and care coordination needs related to Atrial Fibrillation, HTN, and HLD  Pharmacist Clinical Goal(s):  Marland Kitchen Over the next 180 days, patient will work with pharmacist to reduce medication costs related to coverage gap  Interventions: . Comprehensive medication review performed. . Discussed patient assistance program for Eliquis during coverage gap  Patient Self Care Activities:  . Patient verbalizes understanding of plan to apply for assistance during donut hole, Self administers medications as prescribed, Calls pharmacy for medication refills, and Calls provider office for new concerns or questions  Initial goal documentation       Carrie [name] was given information about Chronic Care Management services today including:  1. CCM service includes personalized support from designated clinical staff supervised by her physician, including individualized plan of care and coordination with other care providers 2. 24/7 contact phone numbers for assistance for urgent and routine care needs. 3. Standard insurance, coinsurance, copays and deductibles apply for chronic care management only during months in which we provide at least 20 minutes of these services. Most insurances cover these services at 100%, however patients may be responsible for any copay, coinsurance and/or deductible if applicable. This service may help you avoid the need for more expensive face-to-face services. 4. Only one practitioner may furnish and bill the service in a calendar month. 5. The patient may stop CCM services at any time (effective at the end  of the month) by phone call to the office staff.  Patient agreed to services and verbal consent obtained.   The patient verbalized understanding of instructions provided today and declined a print copy of patient instruction materials.  Telephone follow up appointment with pharmacy team member scheduled for: 4 months  Charlene Brooke, PharmD Clinical Pharmacist [facility] at [facility] [phone]   Preventing Atrial Fibrillation-Related Stroke  Atrial fibrillation is a common type of irregular or rapid heartbeat (arrhythmia) that greatly increases your risk for a stroke. In atrial fibrillation, the top portions of the heart (atria) beat out of sync with the lower portions of the heart. When the muscles of the atria are tightening in an uncoordinated way (fibrillating), blood can pool in the heart and form clots. If a clot travels to the brain, it can cause a stroke. This type of stroke is preventable. Understanding atrial fibrillation and knowing how to properly manage it can prevent you from having a stroke. What increases my risk for a stroke? If you have atrial fibrillation, you may be at increased risk for a stroke if you also:  Have heart failure.  Have high blood pressure.  Are older than age 62.  Have diabetes.  Have a history of vascular disease, such as heart attack or stroke.  Are female. If you have atrial fibrillation and you also have one or more of those risk factors, talk with your health care provider about treatments that can prevent a stroke. Other risk factors for a stroke include:  Smoking.  High cholesterol.  Diabetes.  Being inactive (sedentary lifestyle).  Having a family history of  stroke.  Eating a diet that is high in fat, cholesterol, and salt. What treatments help to manage atrial fibrillation? The main goals of treatment for atrial fibrillation are to prevent blood clots from forming and to keep your heart beating at a normal  rate and rhythm. Treatment may include:  Blood-thinning medicine (anticoagulant) that helps to prevent clots from forming. This medicine also increases the risk of bleeding. Talk with your health care provider about the risks and benefits of taking anticoagulants.  Medicine that slows the heart rate or brings the heart rhythm back to normal.  Electrical cardioversion. This is a procedure that resets the heart's rhythm by delivering a controlled, low-energy shock through your skin to your heart.  An ablation procedure, such as catheter ablation, catheter ablation with pacemaker, or surgical ablation. These procedures destroy the heart tissues that send abnormal signals so that heart rhythms can be improved or made normal. A pacemaker is a device that is placed under the skin to help the heart beat in a regular rhythm. How can I prevent atrial fibrillation-related stroke? Medicines  Take over-the-counter and prescription medicines only as told by your health care provider.  If your health care provider prescribed an anticoagulant, take it exactly as told. Taking too much blood-thinning medicine can cause bleeding. If you do not take enough blood-thinning medicine, you will not have the protection that you need against stroke and other problems. Eating and drinking  Eat healthy foods, including at least 5 servings of fruits and vegetables a day.  Do not drink alcohol.  Do not drink beverages that contain caffeine, such as coffee, soda, and tea.  Follow dietary instructions as told by your health care provider. Managing other medical conditions  Manage and be aware of your blood pressure. If you have high blood pressure, follow your treatment plan to keep it in your target range.  Have your cholesterol checked as often as recommended by your health care provider. If you have high cholesterol, follow your treatment plan to lower it and keep it in your target range.  Talk with your health  care provider about symptoms to watch for. Some people may not have any symptoms, so it can be hard to know that they have atrial fibrillation. Talk with your health care provider if you experience: ? A feeling that your heart is beating rapidly or irregularly. ? An irregular pulse. ? A feeling of discomfort or pain in your chest. ? Shortness of breath. ? Sudden light-headedness or weakness. ? Tiredness (fatigue) that happens easily during exercise.  If you have obstructive sleep apnea (OSA), manage your condition as told by your health care provider. General instructions  Maintain a healthy weight. Do not use diet pills unless your health care provider approves. Diet pills may make heart problems worse.  Exercise regularly. Get at least 30 minutes of activity on most or all days, or as told by your health care provider.  Do not use any products that contain nicotine or tobacco, such as cigarettes and e-cigarettes. If you need help quitting, ask your health care provider.  Do not use drugs, such as cocaine and amphetamines.  Keep all follow-up visits as told by your health care providers. This is important. These include visits with your heart specialist. Where to find more information You may find more information about preventing atrial fibrillation-related stroke from:  National Stroke Association (AFib-Stroke Connection): www.stroke.org Contact a health care provider if:  You notice a change in the rate, rhythm,  or strength of your heartbeat.  You have dizziness.  You are taking an anticoagulant and you have more bruises than usual.  You tire out more easily when you exercise or do similar activities. Get help right away if:   You have chest pain.  You have pain in your abdomen.  You experience unusual sweating or weakness.  You take anticoagulants and you: ? Have severe headaches or confusion. ? Have blood in your vomit, bowel movement, or urine. ? Have bleeding that  will not stop. ? Fall or injure your head.  You have any symptoms of a stroke. "BE FAST" is an easy way to remember the main warning signs of a stroke: ? B - Balance. Signs are dizziness, trouble walking, or loss of balance. ? E - Eyes. Signs are trouble seeing or a sudden change in vision. ? F - Face. Signs are sudden weakness or numbness of the face, or the face or eyelid drooping on one side. ? A - Arms. Signs are weakness or numbness in an arm. This happens suddenly and usually on one side of the body. ? S - Speech. Signs are sudden trouble speaking, slurred speech, or trouble understanding what people say. ? T - Time. Time to call emergency services. Write down what time symptoms started.  You have other signs of a stroke, such as: ? A sudden, severe headache with no known cause. ? Nausea or vomiting. ? Seizure. These symptoms may represent a serious problem that is an emergency. Do not wait to see if the symptoms will go away. Get medical help right away. Call your local emergency services (911 in the [place] Do not drive yourself to the hospital. Summary  Having atrial fibrillation increases the risk for a stroke. Talk with your health care provider about what symptoms to watch for.  Atrial fibrillation-related stroke is preventable. Proper management of atrial fibrillation can prevent you from having a stroke.  Talk with your health care provider about whether anticoagulant medicine is right for you.  Learn the warning signs of a stroke and remember "BE FAST." This information is not intended to replace advice given to you by your health care provider. Make sure you discuss any questions you have with your health care provider. Document Revised: 08/07/2018 Document Reviewed: 07/28/2016 Elsevier Patient Education  [place].

## 2019-08-28 ENCOUNTER — Telehealth: Payer: Self-pay | Admitting: Cardiology

## 2019-08-28 DIAGNOSIS — I4892 Unspecified atrial flutter: Secondary | ICD-10-CM

## 2019-08-28 MED ORDER — APIXABAN 5 MG PO TABS: 5.0000 mg | ORAL_TABLET | Freq: Two times a day (BID) | ORAL | 5 refills | Status: DC

## 2019-08-28 NOTE — Telephone Encounter (Signed)
Patient states they told her she can't get her Eliquis refilled anymore until she has lab work done. Patient needs order for lab work.

## 2019-08-28 NOTE — Telephone Encounter (Signed)
  Last office visit: 12/3/2020Meda Coffee Scr: 0.67, 03/09/2019 Age: 74 y.o. Weight: 57.4 kg    Pt dose not need blood work for refill. Can send in refill for Eliquis. Called pt and LMOM for pt to call back to update her.

## 2019-08-28 NOTE — Telephone Encounter (Signed)
Called and spoke to pt. Informed her that I can  send in a refill for Eliquis. Informed her that to refill her Eliquis we just need to make sure that she has yearly blood work.   Pt stated she usually gets her blood work drawn at Dr. Francesca Oman office. Pt sees Dr. Meda Coffee in June, put on appointment note for pt to get CBC and Bmet drawn at appointment.   Prescription refill sent.

## 2019-08-30 ENCOUNTER — Other Ambulatory Visit: Payer: Self-pay

## 2019-08-30 ENCOUNTER — Ambulatory Visit (INDEPENDENT_AMBULATORY_CARE_PROVIDER_SITE_OTHER): Payer: Medicare HMO | Admitting: Internal Medicine

## 2019-08-30 ENCOUNTER — Encounter: Payer: Self-pay | Admitting: Internal Medicine

## 2019-08-30 DIAGNOSIS — L259 Unspecified contact dermatitis, unspecified cause: Secondary | ICD-10-CM | POA: Insufficient documentation

## 2019-08-30 DIAGNOSIS — J301 Allergic rhinitis due to pollen: Secondary | ICD-10-CM

## 2019-08-30 DIAGNOSIS — I10 Essential (primary) hypertension: Secondary | ICD-10-CM

## 2019-08-30 MED ORDER — TRIAMCINOLONE ACETONIDE 0.1 % EX CREA: 1.0000 "application " | TOPICAL_CREAM | Freq: Two times a day (BID) | CUTANEOUS | 0 refills | Status: AC

## 2019-08-30 MED ORDER — METHYLPREDNISOLONE ACETATE 80 MG/ML IJ SUSP
80.0000 mg | Freq: Once | INTRAMUSCULAR | Status: AC
  Administered 2019-08-30: 80 mg via INTRAMUSCULAR

## 2019-08-30 MED ORDER — PREDNISONE 10 MG PO TABS: ORAL_TABLET | ORAL | 0 refills | Status: DC

## 2019-08-30 NOTE — Patient Instructions (Signed)
You had the steroid shot today  Please take all new medication as prescribed - the prednisone, and the topical cream  Please continue all other medications as before, and refills have been done if requested.  Please have the pharmacy call with any other refills you may need.  Please keep your appointments with your specialists as you may have planned

## 2019-08-30 NOTE — Progress Notes (Signed)
Subjective:    Patient ID: Carrie [name], female    DOB: October 15, 1945, 74 y.o.   MRN: [id number]  HPI  Here with 3 days onset very itchy rash to arms upper chest and face after yardwork, just not getting better. Pt denies chest pain, increased sob or doe, wheezing, orthopnea, PND, increased LE swelling, palpitations, dizziness or syncope.  No tongue or lip swelling.  Pt denies new neurological symptoms such as new headache, or facial or extremity weakness or numbness   Pt denies polydipsia, polyuria,  Does have several wks ongoing nasal allergy symptoms with clearish congestion, itch and sneezing, without fever, pain, ST, cough, swelling or wheezing. Past Medical History:  Diagnosis Date  . Aortic stenosis   . Coronary artery disease    a. Prior LHC (07/24/03): LMCA heavily calcified without significant stenosis, LAD with heavy calcification proximally. 50-60% stenosis just distal to D2, LCx calcified proximally without significant stenosis, Dominant RCA with moderate proximal calcification and mild luminal irregularities throughout, LVEDP 12, LVEF 70%  . Diastolic dysfunction without heart failure   . Former tobacco use   . Former tobacco use   . Heart murmur   . HOCM (hypertrophic obstructive cardiomyopathy) ([place])   . Mild AI (aortic insufficiency)   . Osteoporosis, unspecified   . Other and unspecified hyperlipidemia   . Paroxysmal atrial flutter ([place])   . Pneumonia   . Wears hearing aid    left   Past Surgical History:  Procedure Laterality Date  . ATRIAL ABLATION SURGERY  2005  . BREAST SURGERY  1982   Benign tumor removed RT  . BUNIONECTOMY  04/07/2012   Procedure: Lillard Anes;  Surgeon: Alta Corning, MD;  Location: [facility];  Service: Orthopedics;  Laterality: Left;  CHEVRON OSTEOTOMY LEFT FOOT   . CARDIAC CATHETERIZATION  2005  . GANGLION CYST EXCISION     left wrist  . HERNIA REPAIR  1990   rt ing  . TONSILLECTOMY    . TUBAL LIGATION      reports that she quit smoking about 20 years ago. She has never used smokeless tobacco. She reports current alcohol use of about 7.0 standard drinks of alcohol per week. She reports that she does not use drugs. family history includes Congestive Heart Failure in her mother; Dementia in her mother; Heart disease (age of onset: 62) in her mother; Hypertension in an other family member; Valvular heart disease in her father. Allergies  Allergen Reactions  . Codeine Nausea And Vomiting  . Crestor [Rosuvastatin] Other (See Comments)    Crestor 40mg  fatigue and exhaustion--pt tolerates crestor 20mg   . Latex Rash   Current Outpatient Medications on File Prior to Visit  Medication Sig Dispense Refill  . CALCIUM PO Take 1 tablet by mouth daily.     . Cholecalciferol (VITAMIN D3) 1000 UNITS CAPS Take 1 capsule by mouth daily.      . fish oil-omega-3 fatty acids 1000 MG capsule Take 1 g by mouth daily.      . folic acid (FOLVITE) 1 MG tablet Take 1 mg by mouth daily.    . metoprolol succinate (TOPROL-XL) 25 MG 24 hr tablet Take 25 mg by mouth daily.    . nitroGLYCERIN (NITROLINGUAL) 0.4 MG/SPRAY spray Place 1 spray under the tongue every 5 (five) minutes x 3 doses as needed.     Marland Kitchen omeprazole (PRILOSEC) 40 MG capsule Take 40 mg by mouth daily.    . Thiamine HCl (VITAMIN B-1) 250 MG  tablet Take 500 mg by mouth daily.      . vitamin B-12 (CYANOCOBALAMIN) 1000 MCG tablet Take 1,000 mcg by mouth daily.     No current facility-administered medications on file prior to visit.   Review of Systems All otherwise neg per pt     Objective:   Physical Exam BP 130/80 (BP Location: Right Arm, Patient Position: Sitting, Cuff Size: Small)   Pulse 71   Temp 98.5 F (36.9 C) (Oral)   Ht 5' (1.524 m)   Wt 124 lb (56.2 kg)   SpO2 96%   BMI 24.22 kg/m  VS noted, not ill appearing Constitutional: Pt appears in NAD HENT: Head: NCAT.  Right Ear: External ear normal.  Left Ear: External ear normal.  Eyes: .  Pupils are equal, round, and reactive to light. Conjunctivae and EOM are normal Nose: without d/c or deformity Neck: Neck supple. Gross normal ROM Cardiovascular: Normal rate and regular rhythm.   Pulmonary/Chest: Effort normal and breath sounds without rales or wheezing.  Neurological: Pt is alert. At baseline orientation, motor grossly intact Skin: Skin is warm. no LE edema, + typical contact derm like rash to bialt arms, upper chest and facies Psychiatric: Pt behavior is normal without agitation  All otherwise neg per pt Lab Results  Component Value Date   WBC 8.2 03/19/2019   HGB 12.5 03/19/2019   HCT 36.8 03/19/2019   PLT 515 (H) 03/19/2019   GLUCOSE 92 03/19/2019   CHOL 146 03/19/2019   TRIG 160 (H) 03/19/2019   HDL 54 03/19/2019   LDLDIRECT 133.8 12/19/2012   LDLCALC 65 03/19/2019   ALT 14 03/19/2019   AST 22 03/19/2019   NA 141 03/19/2019   K 4.0 03/19/2019   CL 102 03/19/2019   CREATININE 0.67 03/19/2019   BUN 6 (L) 03/19/2019   CO2 24 03/19/2019   TSH 0.707 03/19/2019   HGBA1C 5.6 03/19/2019       Assessment & Plan:

## 2019-08-30 NOTE — Assessment & Plan Note (Signed)
stable overall by history and exam, recent data reviewed with pt, and pt to continue medical treatment as before,  to f/u any worsening symptoms or concerns  

## 2019-08-30 NOTE — Assessment & Plan Note (Signed)
Also to improved with tx

## 2019-08-30 NOTE — Assessment & Plan Note (Addendum)
Mild to mod, for depomedrol im 80 mg, predpac asd, triam cr prn,  to f/u any worsening symptoms or concerns  I spent 31 minutes in preparing to see the patient by review of recent labs, imaging and procedures, obtaining and reviewing separately obtained history, communicating with the patient and family or caregiver, ordering medications, tests or procedures, and documenting clinical information in the EHR including the differential Dx, treatment, and any further evaluation and other management of contact derm, allergies, hTN

## 2019-09-15 DIAGNOSIS — R339 Retention of urine, unspecified: Secondary | ICD-10-CM | POA: Diagnosis not present

## 2019-09-15 DIAGNOSIS — M545 Low back pain: Secondary | ICD-10-CM | POA: Diagnosis not present

## 2019-09-15 DIAGNOSIS — I4891 Unspecified atrial fibrillation: Secondary | ICD-10-CM | POA: Diagnosis not present

## 2019-09-19 ENCOUNTER — Ambulatory Visit (HOSPITAL_COMMUNITY): Payer: Medicare HMO | Attending: Cardiology

## 2019-09-19 ENCOUNTER — Other Ambulatory Visit: Payer: Self-pay

## 2019-09-19 DIAGNOSIS — I350 Nonrheumatic aortic (valve) stenosis: Secondary | ICD-10-CM | POA: Diagnosis not present

## 2019-09-27 ENCOUNTER — Other Ambulatory Visit: Payer: Self-pay

## 2019-09-27 ENCOUNTER — Telehealth: Payer: Self-pay | Admitting: Cardiology

## 2019-09-27 ENCOUNTER — Other Ambulatory Visit: Payer: Self-pay | Admitting: Cardiology

## 2019-09-27 DIAGNOSIS — I350 Nonrheumatic aortic (valve) stenosis: Secondary | ICD-10-CM

## 2019-09-27 DIAGNOSIS — E785 Hyperlipidemia, unspecified: Secondary | ICD-10-CM

## 2019-09-27 MED ORDER — METOPROLOL SUCCINATE ER 25 MG PO TB24: 25.0000 mg | ORAL_TABLET | Freq: Every day | ORAL | 0 refills | Status: DC

## 2019-09-27 NOTE — Addendum Note (Signed)
Addended by: De Burrs on: 09/27/2019 04:14 PM   Modules accepted: Orders

## 2019-09-27 NOTE — Telephone Encounter (Signed)
New message  *STAT* If patient is at the pharmacy, call can be transferred to refill team.   1. Which medications need to be refilled? (please list name of each medication and dose if known)metoprolol succinate (TOPROL-XL) 25 MG 24 hr tablet  2. Which pharmacy/location (including street and city if local pharmacy) is medication to be sent to?[id number] [place], [place]  3. Do they need a 30 day or 90 day supply? [place]

## 2019-10-01 DIAGNOSIS — Z1231 Encounter for screening mammogram for malignant neoplasm of breast: Secondary | ICD-10-CM | POA: Diagnosis not present

## 2019-10-01 LAB — HM MAMMOGRAPHY

## 2019-10-10 ENCOUNTER — Encounter: Payer: Self-pay | Admitting: Internal Medicine

## 2019-10-12 ENCOUNTER — Ambulatory Visit: Payer: Medicare HMO | Admitting: Cardiology

## 2019-10-12 ENCOUNTER — Other Ambulatory Visit: Payer: Self-pay

## 2019-10-12 ENCOUNTER — Encounter: Payer: Self-pay | Admitting: Cardiology

## 2019-10-12 VITALS — BP 132/78 | HR 111 | Ht 61.0 in | Wt 127.6 lb

## 2019-10-12 DIAGNOSIS — I4892 Unspecified atrial flutter: Secondary | ICD-10-CM

## 2019-10-12 DIAGNOSIS — I350 Nonrheumatic aortic (valve) stenosis: Secondary | ICD-10-CM | POA: Diagnosis not present

## 2019-10-12 DIAGNOSIS — E785 Hyperlipidemia, unspecified: Secondary | ICD-10-CM

## 2019-10-12 DIAGNOSIS — I2510 Atherosclerotic heart disease of native coronary artery without angina pectoris: Secondary | ICD-10-CM | POA: Diagnosis not present

## 2019-10-12 DIAGNOSIS — I10 Essential (primary) hypertension: Secondary | ICD-10-CM

## 2019-10-12 MED ORDER — METOPROLOL SUCCINATE ER 50 MG PO TB24: 50.0000 mg | ORAL_TABLET | Freq: Every day | ORAL | 2 refills | Status: DC

## 2019-10-12 NOTE — Patient Instructions (Signed)
Medication Instructions:   CONTINUE BACK TAKING YOUR TOPROL XL 50 MG BY MOUTH DAILY  *If you need a refill on your cardiac medications before your next appointment, please call your pharmacy*   Follow-Up: At [facility], you and your health needs are our priority.  As part of our continuing mission to provide you with exceptional heart care, we have created designated Provider Care Teams.  These Care Teams include your primary Cardiologist (physician) and Advanced Practice Providers (APPs -  Physician Assistants and Nurse Practitioners) who all work together to provide you with the care you need, when you need it.  We recommend signing up for the patient portal called "MyChart".  Sign up information is provided on this After Visit Summary.  MyChart is used to connect with patients for Virtual Visits (Telemedicine).  Patients are able to view lab/test results, encounter notes, upcoming appointments, etc.  Non-urgent messages can be sent to your provider as well.   To learn more about what you can do with MyChart, go to [email or web address].    Your next appointment:   6 month(s)  The format for your next appointment:   In Person  Provider:   [name], MD

## 2019-10-12 NOTE — Progress Notes (Signed)
Cardiology Office Note:    Date:  10/12/2019   ID:  Carrie [name], DOB 06-10-1945, MRN [id number]  PCP:  [name], MD  Cardiologist:  [name], MD  Electrophysiologist:  None   Referring MD: [name], MD   Reason for visit: 6 months follow-up  History of Present Illness:    Carrie [name] is a 74 y.o. female with a hx of with history of nonobstructive CAD, hypertrophic cardiomyopathy, paroxysmal atrial flutter, and aortic stenosis (moderate by echo in 01/2016), who presents for follow-up of chest pain.    Echo in October 2019 showed normal LVEF with moderate AS, mild AI, and mild to moderate MR.  She transition to me from Dr End as he moved to [place]. She has been doing well, walking a mile/day with no changes with DOE or CP, no palpitations, dizziness or falls. No myalgias or bleeding. The only complain is fatigue since starting zetia.  10/12/2019 -the patient is coming after 6 months, she looks and feels wonderful, she continues to walk for at least a mile a day and has no symptoms of chest pain, dyspnea on exertion, dizziness or falls.  She denies any lower extremity edema.  She has been tolerating her medications very well.  She has no bleeding.  No palpitations until this morning when her heart rate was 120, she has noticed that last Toprol prescription was sent for 25 mg daily instead of 50.   Past Medical History:  Diagnosis Date  . Aortic stenosis   . Coronary artery disease    a. Prior LHC (07/24/03): LMCA heavily calcified without significant stenosis, LAD with heavy calcification proximally. 50-60% stenosis just distal to D2, LCx calcified proximally without significant stenosis, Dominant RCA with moderate proximal calcification and mild luminal irregularities throughout, LVEDP 12, LVEF 70%  . Diastolic dysfunction without heart failure   . Former tobacco use   . Former tobacco use   . Heart murmur   . HOCM (hypertrophic obstructive  cardiomyopathy) ([place])   . Mild AI (aortic insufficiency)   . Osteoporosis, unspecified   . Other and unspecified hyperlipidemia   . Paroxysmal atrial flutter ([place])   . Pneumonia   . Wears hearing aid    left    Past Surgical History:  Procedure Laterality Date  . ATRIAL ABLATION SURGERY  2005  . BREAST SURGERY  1982   Benign tumor removed RT  . BUNIONECTOMY  04/07/2012   Procedure: Lillard Anes;  Surgeon: Alta Corning, MD;  Location: [facility];  Service: Orthopedics;  Laterality: Left;  CHEVRON OSTEOTOMY LEFT FOOT   . CARDIAC CATHETERIZATION  2005  . GANGLION CYST EXCISION     left wrist  . HERNIA REPAIR  1990   rt ing  . TONSILLECTOMY    . TUBAL LIGATION      Current Medications: Current Meds  Medication Sig  . CALCIUM PO Take 1 tablet by mouth daily.   . Cholecalciferol (VITAMIN D3) 1000 UNITS CAPS Take 1 capsule by mouth daily.    Marland Kitchen denosumab (PROLIA) 60 MG/ML SOSY injection Inject 60 mg into the skin every 6 (six) months.  Marland Kitchen ELIQUIS 5 MG TABS tablet Take 5 mg by mouth 2 (two) times daily.  . fish oil-omega-3 fatty acids 1000 MG capsule Take 1 g by mouth daily.    . folic acid (FOLVITE) 1 MG tablet Take 1 mg by mouth daily.  . nitroGLYCERIN (NITROLINGUAL) 0.4 MG/SPRAY spray Place 1 spray under the  tongue every 5 (five) minutes x 3 doses as needed.   Marland Kitchen omeprazole (PRILOSEC) 40 MG capsule Take 40 mg by mouth daily.  . Thiamine HCl (VITAMIN B-1) 250 MG tablet Take 500 mg by mouth daily.    Marland Kitchen triamcinolone cream (KENALOG) 0.1 % Apply 1 application topically 2 (two) times daily.  . vitamin B-12 (CYANOCOBALAMIN) 1000 MCG tablet Take 1,000 mcg by mouth daily.  . [DISCONTINUED] metoprolol succinate (TOPROL-XL) 25 MG 24 hr tablet Take 1 tablet (25 mg total) by mouth daily.     Allergies:   Codeine, Crestor [rosuvastatin], and Latex   Social History   Socioeconomic History  . Marital status: Divorced    Spouse name: Not on file  . Number of children: 2  .  Years of education: Not on file  . Highest education level: Not on file  Occupational History  . Occupation: retired  Tobacco Use  . Smoking status: Former Smoker    Quit date: 04/05/1999    Years since quitting: 20.5  . Smokeless tobacco: Never Used  . Tobacco comment: Regular Exercise-No  Vaping Use  . Vaping Use: Never used  Substance and Sexual Activity  . Alcohol use: Yes    Alcohol/week: 7.0 standard drinks    Types: 7 Glasses of wine per week    Comment: 1 drink per week  . Drug use: No  . Sexual activity: Not on file  Other Topics Concern  . Not on file  Social History Narrative  . Not on file   Social Determinants of Health   Financial Resource Strain:   . Difficulty of Paying Living Expenses:   Food Insecurity:   . Worried About Charity fundraiser in the Last Year:   . Arboriculturist in the Last Year:   Transportation Needs: No Transportation Needs  . Lack of Transportation (Medical): No  . Lack of Transportation (Non-Medical): No  Physical Activity:   . Days of Exercise per Week:   . Minutes of Exercise per Session:   Stress:   . Feeling of Stress :   Social Connections:   . Frequency of Communication with Friends and Family:   . Frequency of Social Gatherings with Friends and Family:   . Attends Religious Services:   . Active Member of Clubs or Organizations:   . Attends Archivist Meetings:   Marland Kitchen Marital Status:      Family History: The patient's family history includes Congestive Heart Failure in her mother; Dementia in her mother; Heart disease (age of onset: 48) in her mother; Hypertension in an other family member; Valvular heart disease in her father. There is no history of Colon cancer or Rectal cancer.  ROS:   Please see the history of present illness.    All other systems reviewed and are negative.  EKGs/Labs/Other Studies Reviewed:    The following studies were reviewed today:  EKG:  EKG is ordered today.  The ekg ordered  today demonstrates normal sinus rhythm, borderline LVH, otherwise normal unchanged from prior.  Recent Labs: 03/19/2019: ALT 14; BUN 6; Creatinine, Ser 0.67; Hemoglobin 12.5; Platelets 515; Potassium 4.0; Sodium 141; TSH 0.707  Recent Lipid Panel    Component Value Date/Time   CHOL 146 03/19/2019 1051   TRIG 160 (H) 03/19/2019 1051   HDL 54 03/19/2019 1051   CHOLHDL 2.7 03/19/2019 1051   CHOLHDL 2.3 08/18/2015 1030   VLDL 22 08/18/2015 1030   LDLCALC 65 03/19/2019 1051   LDLDIRECT 133.8  12/19/2012 8372    Physical Exam:    VS:  BP 132/78   Pulse (!) 111   Ht 5\' 1"  (1.549 m)   Wt 127 lb 9.6 oz (57.9 kg)   SpO2 97%   BMI 24.11 kg/m     Wt Readings from Last 3 Encounters:  10/12/19 127 lb 9.6 oz (57.9 kg)  08/30/19 124 lb (56.2 kg)  03/29/19 126 lb 9.6 oz (57.4 kg)     GEN:  Well nourished, well developed in no acute distress HEENT: Normal NECK: No JVD; No carotid bruits LYMPHATICS: No lymphadenopathy CARDIAC: RRR, 5 out of 6 holosystolic S2 present, 3/6 diastolic murmur, no rubs, gallops RESPIRATORY:  Clear to auscultation without rales, wheezing or rhonchi  ABDOMEN: Soft, non-tender, non-distended MUSCULOSKELETAL:  No edema; No deformity  SKIN: Warm and dry NEUROLOGIC:  Alert and oriented x 3 PSYCHIATRIC:  Normal affect   ASSESSMENT:    1. Coronary artery disease involving native coronary artery of native heart without angina pectoris   2. Essential hypertension   3. Hyperlipidemia, unspecified hyperlipidemia type   4. Paroxysmal atrial flutter (HCC)   5. Moderate aortic stenosis      PLAN:    In order of problems listed above:  Nonobstructive coronary artery disease No symptoms , she is congratulated on her physical activity and is encouraged to continue. Continue Crestor and fish oil, her HDL and LDL are at goal, triglycerides are 160.  Atrial flutter No symptoms to suggest recurrence.  EKG today demonstrates sinus rhythm to sinus tachycardia heart  rate from 90-1 10, we will send a new prescription for Toprol-XL 50 mg daily.  No bleeding with Eliquis,  Hemoglobin 12.5.  Aortic stenosis Most recent echocardiogram from May 2021 shows moderate aortic regurgitation, moderate to severe aortic stenosis Vmax 4.2 m/s, MG 34 mmHg, AVA 0.9 cm^2, DI 0.4, however she is completely asymptomatic and on physical exam she still has S2. We will continue to follow closely.  Hypertrophic cardiomyopathy No syncope or other symptoms.  I recommend continued beta-blockade with target heart rate in the 60s.  No further intervention at this time.  Hypertension Blood pressure well controlled.  Continue current medications.  Hyperlipidemia As above, normal LFTs.  Medication Adjustments/Labs and Tests Ordered: Current medicines are reviewed at length with the patient today.  Concerns regarding medicines are outlined above.  Orders Placed This Encounter  Procedures  . EKG 12-Lead   Meds ordered this encounter  Medications  . metoprolol succinate (TOPROL-XL) 50 MG 24 hr tablet    Sig: Take 1 tablet (50 mg total) by mouth daily. Take with or immediately following a meal.    Dispense:  90 tablet    Refill:  2    There are no Patient Instructions on file for this visit.   Signed, [name], MD  10/12/2019 10:38 AM    [place]

## 2019-10-18 DIAGNOSIS — R69 Illness, unspecified: Secondary | ICD-10-CM | POA: Diagnosis not present

## 2019-11-20 ENCOUNTER — Telehealth: Payer: Self-pay | Admitting: Cardiology

## 2019-11-20 NOTE — Telephone Encounter (Signed)
**Note De-Identified  Obfuscation** The pt states that her Eliquis will increase in cost at the end of August per her ins provider Holland Falling as she will fall into her "donut hole" at that time.  I gave her [place] phone number and advised her to give them a call to ask questions concerning her eligibility to be approved for their program and if she is to request that they mail her an application.  She is aware to complete her part of the application, obtain required documents per [employer] and to bring all to the office to drop off and that we will handle the providers part of the application and will fax all to [employer] Pt Asst program.  We did discuss Warfarin as it is the only anticoagulant on the market at this time but she is not interested in switching at this time.Marland Kitchen

## 2019-11-20 NOTE — Telephone Encounter (Signed)
New Message  Pt c/o medication issue:  1. Name of Medication: ELIQUIS 5 MG TABS tablet  2. How are you currently taking this medication (dosage and times per day)? As directed  3. Are you having a reaction (difficulty breathing--STAT)? No  4. What is your medication issue? Patient states that she is in the donut hole with insurance and needs some assistance with what to do about medication. Please give patient a call back to assist.

## 2019-11-20 NOTE — Telephone Encounter (Signed)
Will forward this message to our Prior [place], for further assistance with pts Eliquis.

## 2019-11-22 DIAGNOSIS — R69 Illness, unspecified: Secondary | ICD-10-CM | POA: Diagnosis not present

## 2019-11-28 IMAGING — RF DG SWALLOWING FUNCTION
18 series · 24 of 24 positions shown · non-contrast
Comparison: None

CLINICAL DATA: Dysphagia, unspecified type.

EXAM:
MODIFIED BARIUM SWALLOW
TECHNIQUE: Different consistencies of barium were administered orally to the
patient by the Speech Pathologist. Imaging of the pharynx was
performed in the lateral projection. The radiologist was present in
the fluoroscopy room for this study, providing personal supervision.
FLUOROSCOPY TIME:  Fluoroscopy Time:  1.5 minutes
Radiation Exposure Index (if provided by the fluoroscopic device):
5.10 mGy
Number of Acquired Spot Images: None

[Series 1: cp_standard · 0.18mm/px · 1 of 1 slices shown (1 of 18)]
[im 1/1]
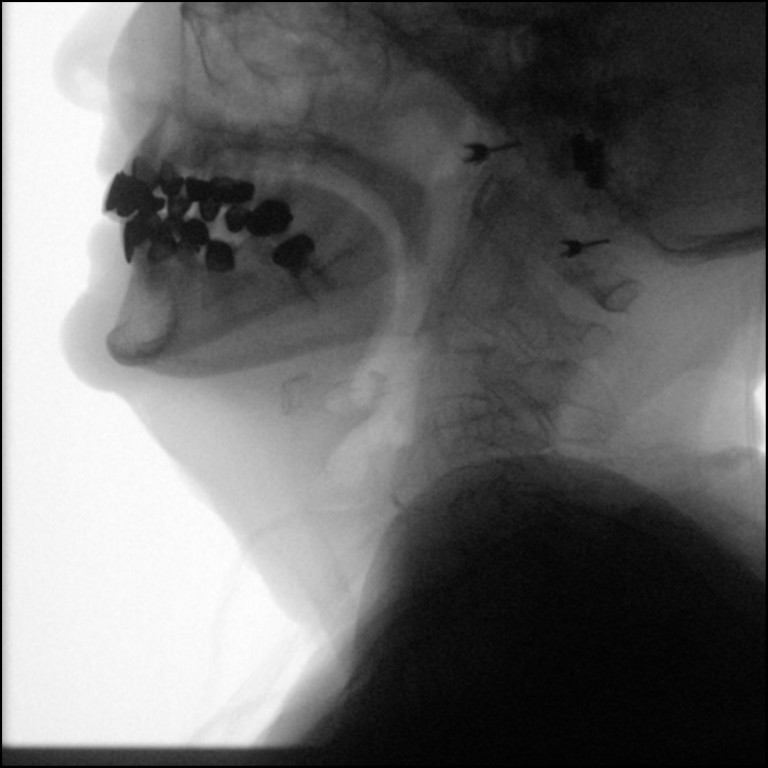

[Series 2: cp_standard · 0.55mm/px · 1 of 84 frames shown (2 of 18)]
[frame 43/84]
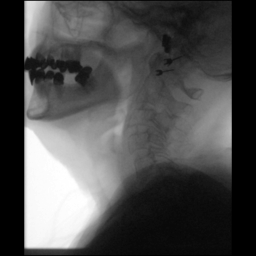

[Series 3: cp_standard · 0.55mm/px · 1 of 96 frames shown (3 of 18)]
[frame 15/96]
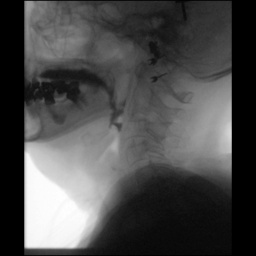

[Series 4: cp_standard · 0.55mm/px · 2 of 80 frames shown (4 of 18)]
[frame 13/80]
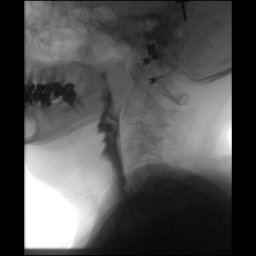
[frame 76/80]
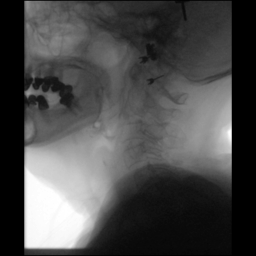

[Series 5: cp_standard · 0.55mm/px · 1 of 86 frames shown (5 of 18)]
[frame 44/86]
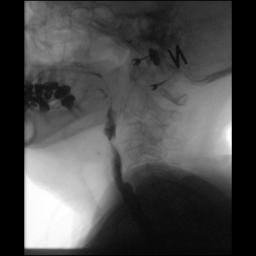

[Series 6: cp_standard · 0.55mm/px · 1 of 32 frames shown (6 of 18)]
[frame 5/32]
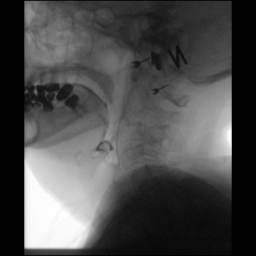

[Series 7: cp_standard · 0.55mm/px · 2 of 246 frames shown (7 of 18)]
[frame 37/246]
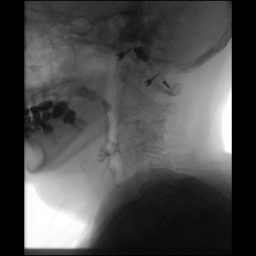
[frame 243/246]
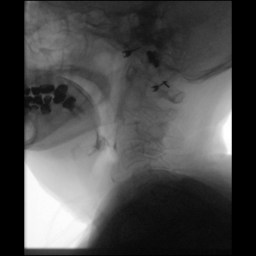

[Series 8: cp_standard · 0.55mm/px · 1 of 400 frames shown (8 of 18)]
[frame 240/400]
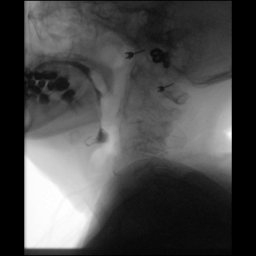

[Series 9: cp_standard · 0.55mm/px · 1 of 65 frames shown (9 of 18)]
[frame 33/65]
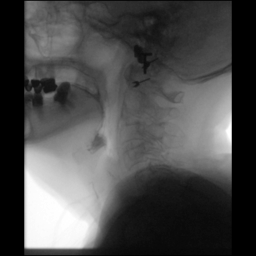

[Series 10: cp_standard · 0.55mm/px · 2 of 46 frames shown (10 of 18)]
[frame 7/46]
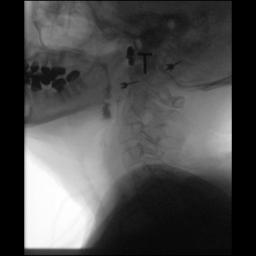
[frame 40/46]
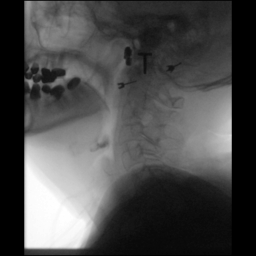

[Series 11: cp_standard · 0.55mm/px · 1 of 181 frames shown (11 of 18)]
[frame 94/181]
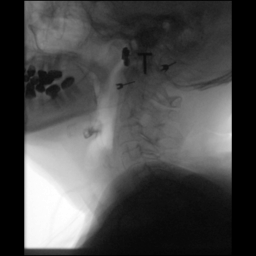

[Series 12: cp_standard · 0.55mm/px · 1 of 84 frames shown (12 of 18)]
[frame 43/84]
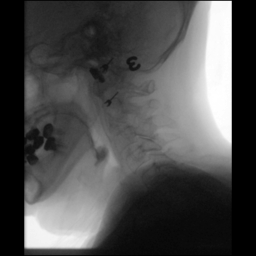

[Series 13: cp_standard · 0.55mm/px · 2 of 83 frames shown (13 of 18)]
[frame 13/83]
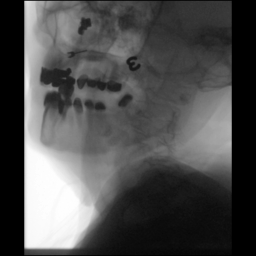
[frame 79/83]
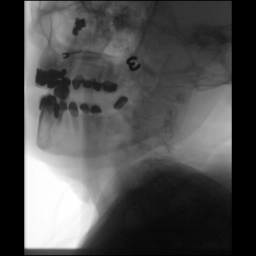

[Series 14: cp_standard · 0.55mm/px · 1 of 39 frames shown (14 of 18)]
[frame 31/39]
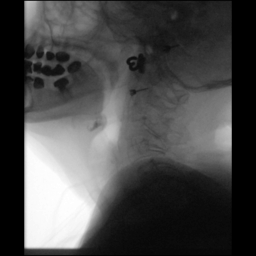

[Series 15: cp_standard · 0.55mm/px · 2 of 134 frames shown (15 of 18)]
[frame 5/134]
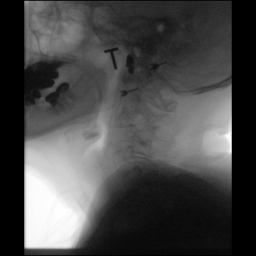
[frame 114/134]
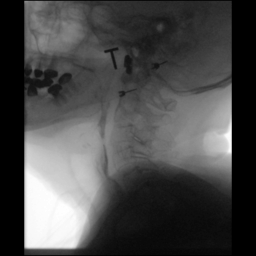

[Series 16: cp_standard · 0.55mm/px · 1 of 242 frames shown (16 of 18)]
[frame 206/242]
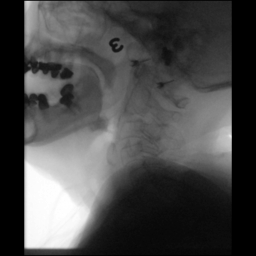

[Series 17: cp_standard · 0.55mm/px · 1 of 113 frames shown (17 of 18)]
[frame 17/113]
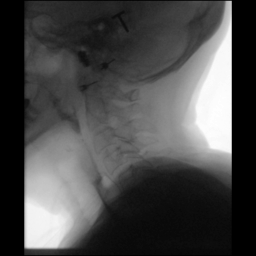

[Series 18: cp_standard · 0.52mm/px · 2 of 65 frames shown (18 of 18)]
[frame 2/65]
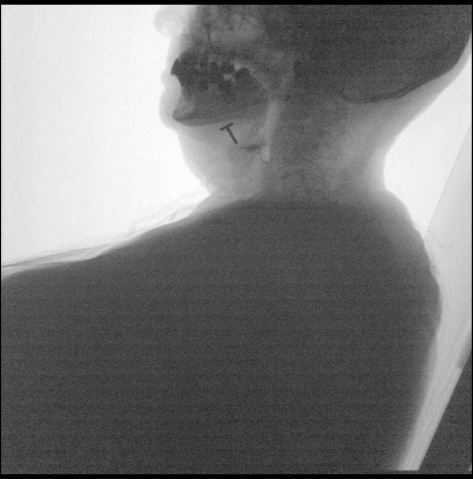
[frame 56/65]
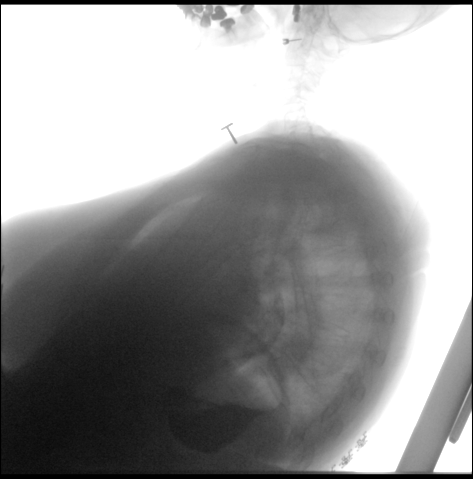

[24 of 24 positions shown; findings below may reference images not displayed]

FINDINGS: Different consistencies of barium were administered orally to the
patient by the Speech Pathologist with fluoroscopic imaging of the
pharynx, as well as limited imaging of the esophagus, from a lateral
projection. The radiologist was present in the fluoroscopy room for
this study, providing personal supervision. A single instance of
aspiration with thin consistency was observed. Please refer to the
Speech Pathologist's report for full details.
IMPRESSION: A single instance of aspiration with thin consistency was observed.
Please refer to the Speech Pathologists report for complete details
and recommendations.

## 2019-12-13 ENCOUNTER — Ambulatory Visit: Payer: Medicare HMO | Admitting: Pharmacist

## 2019-12-13 ENCOUNTER — Other Ambulatory Visit: Payer: Self-pay

## 2019-12-13 DIAGNOSIS — I480 Paroxysmal atrial fibrillation: Secondary | ICD-10-CM

## 2019-12-13 DIAGNOSIS — I2583 Coronary atherosclerosis due to lipid rich plaque: Secondary | ICD-10-CM

## 2019-12-13 DIAGNOSIS — I2510 Atherosclerotic heart disease of native coronary artery without angina pectoris: Secondary | ICD-10-CM

## 2019-12-13 DIAGNOSIS — I10 Essential (primary) hypertension: Secondary | ICD-10-CM

## 2019-12-13 DIAGNOSIS — E785 Hyperlipidemia, unspecified: Secondary | ICD-10-CM

## 2019-12-13 NOTE — Patient Instructions (Addendum)
Visit Information  Phone number for Pharmacist: [phone]  Goals Addressed            This Visit's Progress   . Pharmacy Care Plan       CARE PLAN ENTRY (see longitudinal plan of care for additional care plan information)  Current Barriers:  . Chronic Disease Management support, education, and care coordination needs related to Hypertension, Hyperlipidemia, Atrial Fibrillation, and Coronary Artery Disease   Hypertension / Atrial Fibrillation BP Readings from Last 3 Encounters:  10/12/19 132/78  08/30/19 130/80  03/29/19 116/68 .  Pharmacist Clinical Goal(s): o Over the next 180 days, patient will work with PharmD and providers to maintain BP goal <130/80 . Current regimen:  o metoprolol succinate 25 mg daily,  o Eliquis 5 mg twice a day . Interventions: o Discussed BP goals and benefits of medication for prevention of heart attack / stroke o Pursue patient assistance for Eliquis through [employer] . Patient self care activities - Over the next 180 days, patient will: o Check BP as needed, document, and provide at future appointments o Ensure daily salt intake < 2300 mg/day o Complete patient portion of Eliquis application, gather proof of income and out-of-pocket prescription costs, and bring application to Cardiology office to complete  Hyperlipidemia  / nonobstructive CAD Lab Results  Component Value Date/Time   LDLCALC 65 03/19/2019 10:51 AM   LDLDIRECT 133.8 12/19/2012 08:28 AM .  Pharmacist Clinical Goal(s): o Over the next 180 days, patient will work with PharmD and providers to maintain LDL goal < 70 . Current regimen:  o rosuvastatin 20 mg daily at bedtime o nitroglycerin 0.4 mg spray as needed o fish oil OTC 1 g daily  . Interventions: o Discussed cholesterol goals and benefits of medication for prevention of heart attack / stroke . Patient self care activities - Over the next 180 days, patient will: o Continue medication as prescribed  Medication  management . Pharmacist Clinical Goal(s): o Over the next 180 days, patient will work with PharmD and providers to maintain optimal medication adherence . Current pharmacy: [facility] . Interventions o Comprehensive medication review performed. o Continue current medication management strategy . Patient self care activities - Over the next 180 days, patient will: o Focus on medication adherence by fill date o Take medications as prescribed o Report any questions or concerns to PharmD and/or provider(s)  Please see past updates related to this goal by clicking on the "Past Updates" button in the selected goal       Patient verbalizes understanding of instructions provided today.   Telephone follow up appointment with pharmacy team member scheduled for: 6 months  Charlene Brooke, PharmD Clinical Pharmacist [facility] Maintenance for Postmenopausal Women Menopause is a normal process in which your ability to get pregnant comes to an end. This process happens slowly over many months or years, usually between the ages of 10 and 11. Menopause is complete when you have missed your menstrual periods for 12 months. It is important to talk with your health care provider about some of the most common conditions that affect women after menopause (postmenopausal women). These include heart disease, cancer, and bone loss (osteoporosis). Adopting a healthy lifestyle and getting preventive care can help to promote your health and wellness. The actions you take can also lower your chances of developing some of these common conditions. What should I know about menopause? During menopause, you may get a number of symptoms, such as:  Hot flashes. These can be moderate or severe.  Night sweats.  Decrease in sex drive.  Mood swings.  Headaches.  Tiredness.  Irritability.  Memory problems.  Insomnia. Choosing to treat or not to treat these symptoms  is a decision that you make with your health care provider. Do I need hormone replacement therapy?  Hormone replacement therapy is effective in treating symptoms that are caused by menopause, such as hot flashes and night sweats.  Hormone replacement carries certain risks, especially as you become older. If you are thinking about using estrogen or estrogen with progestin, discuss the benefits and risks with your health care provider. What is my risk for heart disease and stroke? The risk of heart disease, heart attack, and stroke increases as you age. One of the causes may be a change in the body's hormones during menopause. This can affect how your body uses dietary fats, triglycerides, and cholesterol. Heart attack and stroke are medical emergencies. There are many things that you can do to help prevent heart disease and stroke. Watch your blood pressure  High blood pressure causes heart disease and increases the risk of stroke. This is more likely to develop in people who have high blood pressure readings, are of African descent, or are overweight.  Have your blood pressure checked: ? Every 3-5 years if you are 62-63 years of age. ? Every year if you are 55 years old or older. Eat a healthy diet   Eat a diet that includes plenty of vegetables, fruits, low-fat dairy products, and lean protein.  Do not eat a lot of foods that are high in solid fats, added sugars, or sodium. Get regular exercise Get regular exercise. This is one of the most important things you can do for your health. Most adults should:  Try to exercise for at least 150 minutes each week. The exercise should increase your heart rate and make you sweat (moderate-intensity exercise).  Try to do strengthening exercises at least twice each week. Do these in addition to the moderate-intensity exercise.  Spend less time sitting. Even light physical activity can be beneficial. Other tips  Work with your health care provider  to achieve or maintain a healthy weight.  Do not use any products that contain nicotine or tobacco, such as cigarettes, e-cigarettes, and chewing tobacco. If you need help quitting, ask your health care provider.  Know your numbers. Ask your health care provider to check your cholesterol and your blood sugar (glucose). Continue to have your blood tested as directed by your health care provider. Do I need screening for cancer? Depending on your health history and family history, you may need to have cancer screening at different stages of your life. This may include screening for:  Breast cancer.  Cervical cancer.  Lung cancer.  Colorectal cancer. What is my risk for osteoporosis? After menopause, you may be at increased risk for osteoporosis. Osteoporosis is a condition in which bone destruction happens more quickly than new bone creation. To help prevent osteoporosis or the bone fractures that can happen because of osteoporosis, you may take the following actions:  If you are 42-75 years old, get at least 1,000 mg of calcium and at least 600 mg of vitamin D per day.  If you are older than age 5 but younger than age 71, get at least 1,200 mg of calcium and at least 600 mg of vitamin D per day.  If you are older than age 57, get at least 66,200  mg of calcium and at least 800 mg of vitamin D per day. Smoking and drinking excessive alcohol increase the risk of osteoporosis. Eat foods that are rich in calcium and vitamin D, and do weight-bearing exercises several times each week as directed by your health care provider. How does menopause affect my mental health? Depression may occur at any age, but it is more common as you become older. Common symptoms of depression include:  Low or sad mood.  Changes in sleep patterns.  Changes in appetite or eating patterns.  Feeling an overall lack of motivation or enjoyment of activities that you previously enjoyed.  Frequent crying spells. Talk  with your health care provider if you think that you are experiencing depression. General instructions See your health care provider for regular wellness exams and vaccines. This may include:  Scheduling regular health, dental, and eye exams.  Getting and maintaining your vaccines. These include: ? Influenza vaccine. Get this vaccine each year before the flu season begins. ? Pneumonia vaccine. ? Shingles vaccine. ? Tetanus, diphtheria, and pertussis (Tdap) booster vaccine. Your health care provider may also recommend other immunizations. Tell your health care provider if you have ever been abused or do not feel safe at home. Summary  Menopause is a normal process in which your ability to get pregnant comes to an end.  This condition causes hot flashes, night sweats, decreased interest in sex, mood swings, headaches, or lack of sleep.  Treatment for this condition may include hormone replacement therapy.  Take actions to keep yourself healthy, including exercising regularly, eating a healthy diet, watching your weight, and checking your blood pressure and blood sugar levels.  Get screened for cancer and depression. Make sure that you are up to date with all your vaccines. This information is not intended to replace advice given to you by your health care provider. Make sure you discuss any questions you have with your health care provider. Document Revised: 04/05/2018 Document Reviewed: 04/05/2018 Elsevier Patient Education  2020 [employer].

## 2019-12-13 NOTE — Chronic Care Management (AMB) (Signed)
Chronic Care Management Pharmacy  Name: Carrie [name]  MRN: [id number] DOB: 11-10-1945  Chief Complaint/ HPI  Carrie [name],  74 y.o. , female presents for their Follow-Up CCM visit with the clinical pharmacist via telephone due to COVID-19 Pandemic.  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  Office Visits: 08/30/19 Dr Jenny Reichmann OV: acute visit for contact dermatitis, given steroids. Also dc'd rosuvastatin and Eliquis due to pt not taking  01/04/19 Dr Ronnald Ramp OV: epigastric pain and dysphagia. Started omeprazole 29m and referred to GI. UA positive but UCx negative, likely OAB, rx'd Myrbetriq. However med is expensive, pt is ok dealing with OAB w/o meds.  11/01/18 Dr JRonnald RampOV: UTI - rx'd Macrobid.  Consult Visit: 10/12/19 Dr NMeda Coffee(cardiology): continue statin, Eliquis. Also pursuing PAP through cardiology office for Eliquis.  03/29/19 Dr NMeda Coffee(cardiology): conditions [name], no med changes. Will repeat ECHO in 6 mos d/t aortic stenosis.  03/16/19 Dr NSilverio Decamp(GI): dysphagia, EGD 01/2019 for dilation. Continue PPI.  Medications: Outpatient Encounter Medications as of 12/13/2019  Medication Sig  . CALCIUM PO Take 1 tablet by mouth daily.   . Cholecalciferol (VITAMIN D3) 1000 UNITS CAPS Take 1 capsule by mouth daily.    .Marland Kitchendenosumab (PROLIA) 60 MG/ML SOSY injection Inject 60 mg into the skin every 6 (six) months.  .Marland KitchenELIQUIS 5 MG TABS tablet Take 5 mg by mouth 2 (two) times daily.  . fish oil-omega-3 fatty acids 1000 MG capsule Take 1 g by mouth daily.    . folic acid (FOLVITE) 1 MG tablet Take 1 mg by mouth daily.  . metoprolol succinate (TOPROL-XL) 50 MG 24 hr tablet Take 1 tablet (50 mg total) by mouth daily. Take with or immediately following a meal.  . nitroGLYCERIN (NITROLINGUAL) 0.4 MG/SPRAY spray Place 1 spray  under the tongue every 5 (five) minutes x 3 doses as needed.   .Marland Kitchenomeprazole (PRILOSEC) 40 MG capsule Take 40 mg by mouth daily.  . rosuvastatin (CRESTOR) 20 MG tablet Take 20 mg by mouth daily.  . Thiamine HCl (VITAMIN B-1) 250 MG tablet Take 500 mg by mouth daily.    .Marland Kitchentriamcinolone cream (KENALOG) 0.1 % Apply 1 application topically 2 (two) times daily.  . vitamin B-12 (CYANOCOBALAMIN) 1000 MCG tablet Take 1,000 mcg by mouth daily.   No facility-administered encounter medications on file as of 12/13/2019.     Current Diagnosis/Assessment:  SDOH Interventions     Most Recent Value  SDOH Interventions  Financial Strain Interventions Other (Comment)  [Eliquis PAP]     Goals Addressed            This Visit's Progress   . Pharmacy Care Plan       CARE PLAN ENTRY (see longitudinal plan of care for additional care plan information)  Current Barriers:  . Chronic Disease Management support, education, and care coordination needs related to Hypertension, Hyperlipidemia, Atrial Fibrillation, and Coronary Artery Disease   Hypertension / Atrial Fibrillation BP Readings from Last 3 Encounters:  10/12/19 132/78  08/30/19 130/80  03/29/19 116/68 .  Pharmacist Clinical Goal(s): o Over the next 180 days, patient will work with PharmD and providers to maintain BP goal <130/80 . Current regimen:  o metoprolol succinate 25 mg daily,  o Eliquis 5 mg twice a day . Interventions: o Discussed BP goals and benefits of  medication for prevention of heart attack / stroke o Pursue patient assistance for Eliquis through [employer] . Patient self care activities - Over the next 180 days, patient will: o Check BP as needed, document, and provide at future appointments o Ensure daily salt intake < 2300 mg/day o Complete patient portion of Eliquis application, gather proof of income and out-of-pocket prescription costs, and bring application to Cardiology office to complete  Hyperlipidemia  / nonobstructive  CAD Lab Results  Component Value Date/Time   LDLCALC 65 03/19/2019 10:51 AM   LDLDIRECT 133.8 12/19/2012 08:28 AM .  Pharmacist Clinical Goal(s): o Over the next 180 days, patient will work with PharmD and providers to maintain LDL goal < 70 . Current regimen:  o rosuvastatin 20 mg daily at bedtime o nitroglycerin 0.4 mg spray as needed o fish oil OTC 1 g daily  . Interventions: o Discussed cholesterol goals and benefits of medication for prevention of heart attack / stroke . Patient self care activities - Over the next 180 days, patient will: o Continue medication as prescribed  Medication management . Pharmacist Clinical Goal(s): o Over the next 180 days, patient will work with PharmD and providers to maintain optimal medication adherence . Current [facility] . Interventions o Comprehensive medication review performed. o Continue current medication management strategy . Patient self care activities - Over the next 180 days, patient will: o Focus on medication adherence by fill date o Take medications as prescribed o Report any questions or concerns to PharmD and/or provider(s)  Please see past updates related to this goal by clicking on the "Past Updates" button in the selected goal       SDOH Interventions     Most Recent Value  SDOH Interventions  Financial Strain Interventions Other (Comment)  [Eliquis PAP]      AFIB / Hypertension   Patient is currently rate controlled. Pulse Readings from Last 3 Encounters:  10/12/19 (!) 111  08/30/19 71  03/29/19 96   BP goal < 130/80 BP Readings from Last 3 Encounters:  10/12/19 132/78  08/30/19 130/80  03/29/19 116/68   Kidney Function Lab Results  Component Value Date/Time   CREATININE 0.67 03/19/2019 10:51 AM   CREATININE 0.63 01/03/2018 11:20 AM   CREATININE 0.75 03/16/2016 10:52 AM   GFR 98.54 01/03/2018 11:20 AM   GFRNONAA 87 03/19/2019 10:51 AM   GFRAA 101 03/19/2019 10:51 AM   K 4.0 03/19/2019 10:51 AM    K 4.3 01/03/2018 11:20 AM   Patrent checks BP at home infrequently  Patient home BP readings are ranging: "good"  Patient has failed these meds in past: n/a Patient is currently controlled on the following medications:   metoprolol succinate 25 mg daily,   Eliquis 5 mg BID  We discussed:  Pt reports she is in the donut hole, Eliquis will go up to >$100/month. Discussed [employer] PAP eligibility criteria, pt believes she may qualify. Will mail application to patient to take to cardiology office.  Plan  Continue current medications  Pursue Eliquis PAP via cardiology office  Hyperlipidemia/CAD   LDL goal < 70 Hx nonobstructive CAD, cath without stents 2005.  Lipid Panel     Component Value Date/Time   CHOL 146 03/19/2019 1051   TRIG 160 (H) 03/19/2019 1051   HDL 54 03/19/2019 1051   CHOLHDL 2.7 03/19/2019 1051   CHOLHDL 2.3 08/18/2015 1030   VLDL 22 08/18/2015 1030   LDLCALC 65 03/19/2019 1051   LDLDIRECT 133.8 12/19/2012 0828  LABVLDL 27 03/19/2019 1051   Hepatic Function Latest Ref Rng & Units 03/19/2019 08/24/2018 05/18/2018  Total Protein 6.0 - 8.5 g/dL 7.1 - -  Albumin 3.7 - 4.7 g/dL 3.6(L) - -  AST 0 - 40 IU/L 22 - -  ALT 0 - 32 IU/L _0 Alk Phosphatase 39 - 117 IU/L 85 - -  Total Bilirubin 0.0 - 1.2 mg/dL 0.5 - -  Bilirubin, Direct 0.00 - 0.40 mg/dL - - -   Patient has failed these meds in past: n/a Patient is currently controlled on the following medications:   rosuvastatin 20 mg daily HS  nitroglycerin 0.4 mg spray prn,   fish oil OTC 1 g daily   We discussed:  diet and exercise extensively; cholesterol goals; benefits of statin for ASCVD risk reduction  Plan  Continue current medications and control with diet and exercise   Osteoporosis   Last DEXA Scan: 01/26/2017  T-Score femoral neck: -3.1  T-Score total hip: n/a  T-Score lumbar spine: -2.5  T-Score forearm radius: n/a  Vit D, 25-Hydroxy  Date Value Ref Range Status  01/30/2010 54 30  - 89 ng/mL Final    Comment:    See lab report for associated comment(s)     Patient is a candidate for pharmacologic treatment due to T-Score < -2.5 in femoral neck and T-Score < -2.5 in lumbar spine  Patient has failed these meds in past: n/a Patient is currently controlled on the following medications:  . Prolia q6 months @ PCP office (last injection 07/02/2019) . Calcium . Vitamin D 1000 IU daily  We discussed:  Recommend [phone] units of vitamin D daily. Recommend 1200 mg of calcium daily from dietary and supplemental sources. pt reports Prolia was affordable last time  Plan  Continue current medications   Medication Management   [facility] Uses pill box Pt endorses 100% compliance  We discussed: [facility], she is not interested in delivery.  Plan  Continue current medication management strategy   Follow up: 6 month phone visit   Charlene Brooke, PharmD, [facility] Clinical Pharmacist [facility] [phone]

## 2019-12-20 ENCOUNTER — Telehealth: Payer: Self-pay | Admitting: Cardiology

## 2019-12-20 NOTE — Telephone Encounter (Signed)
Patient is applying for the [place] [facility]. She was not sure if there was anything Dr. Meda Coffee needed to fill out for the application. Please advise

## 2019-12-21 NOTE — Telephone Encounter (Signed)
Follow up     Patient request to talk to someone to get help with paying for her eliquis.

## 2019-12-21 NOTE — Telephone Encounter (Signed)
Patient called to check status.

## 2019-12-21 NOTE — Telephone Encounter (Signed)
**Note De-Identified  Obfuscation** The pt is reminded of the conversation she and I had on 7/27 (see phone note in chart) concerning her applying for asst for her Eliquis through [place].  She does have [employer] phone number written down from our last conversation and she repeated it back to me correctly, She states that she is calling them now to ask questions concerning their program and to request that they mail her a application.   She is aware to complete her part of the application, obtain required documents per [employer] and to bring all to the office to drop off and that we will handle the providers part of the application and will fax all to [employer] Pt Asst program.

## 2019-12-26 ENCOUNTER — Telehealth: Payer: Self-pay

## 2019-12-26 NOTE — Telephone Encounter (Signed)
Pt assistance paperwork printed off and placed in Dr. Francesca Oman folder to sign and date, upon return to the office tomorrow.  Once signed, this will be given to medical records thereafter to fax to [place] Pt assistance.

## 2019-12-26 NOTE — Telephone Encounter (Signed)
**Note De-Identified  Obfuscation** The pt left her completed [place] pt asst application at the office with documents.  I have completed the provider page of the application and emailed it to Dr Thera Flake nurse so she can obtain Dr Thera Flake signature, date it and to fax all to [employer] pt asst [employer] at fax number written on cover letter included.

## 2019-12-27 ENCOUNTER — Telehealth: Payer: Self-pay | Admitting: Cardiology

## 2019-12-27 NOTE — Telephone Encounter (Signed)
     I went in pt's chart to what was the status of her pt's assistance for her medicine.

## 2019-12-27 NOTE — Telephone Encounter (Signed)
Pt assistance forms were filled out and dated by Dr. Meda Coffee this morning.  Placed forms in Medical Records, in nurses fax box, for them to fax to [place] pt assistance [employer].

## 2019-12-28 NOTE — Telephone Encounter (Signed)
**Note De-Identified  Obfuscation** The pt is advised that per Dr Thera Flake nurse her application was faxed to [employer] pt asst [employer] yesterday. I did give her [employer] phone number so she can call to check the progress of her application.  She thanked me for calling her back with update.

## 2019-12-28 NOTE — Telephone Encounter (Signed)
Follow Up:    Pt wants to know if the papers are signed that she left here on 12-25-19?

## 2020-01-03 NOTE — Telephone Encounter (Signed)
Received fax from [place] stating that her Pt Asst has been approved. She is eligible to receive Eliquis free of charge from 01/01/2020-04/25/2020.  Pt has been notified.   Application Case#: [id number]

## 2020-01-09 ENCOUNTER — Telehealth: Payer: Self-pay | Admitting: Pharmacist

## 2020-01-09 NOTE — Progress Notes (Signed)
    Chronic Care Management Pharmacy Assistant   Name: Carrie [name]  MRN: [id number] DOB: July 15, 1945  Reason for Encounter: PAP follow up Call    PCP : [name], MD  Allergies:   Allergies  Allergen Reactions  . Codeine Nausea And Vomiting  . Crestor [Rosuvastatin] Other (See Comments)    Crestor 40mg  fatigue and exhaustion--pt tolerates crestor 20mg   . Latex Rash    Medications: Outpatient Encounter Medications as of 01/09/2020  Medication Sig  . CALCIUM PO Take 1 tablet by mouth daily.   . Cholecalciferol (VITAMIN D3) 1000 UNITS CAPS Take 1 capsule by mouth daily.    Marland Kitchen denosumab (PROLIA) 60 MG/ML SOSY injection Inject 60 mg into the skin every 6 (six) months.  Marland Kitchen ELIQUIS 5 MG TABS tablet Take 5 mg by mouth 2 (two) times daily.  . fish oil-omega-3 fatty acids 1000 MG capsule Take 1 g by mouth daily.    . folic acid (FOLVITE) 1 MG tablet Take 1 mg by mouth daily.  . metoprolol succinate (TOPROL-XL) 50 MG 24 hr tablet Take 1 tablet (50 mg total) by mouth daily. Take with or immediately following a meal.  . nitroGLYCERIN (NITROLINGUAL) 0.4 MG/SPRAY spray Place 1 spray under the tongue every 5 (five) minutes x 3 doses as needed.   Marland Kitchen omeprazole (PRILOSEC) 40 MG capsule Take 40 mg by mouth daily.  . rosuvastatin (CRESTOR) 20 MG tablet Take 20 mg by mouth daily.  . Thiamine HCl (VITAMIN B-1) 250 MG tablet Take 500 mg by mouth daily.    Marland Kitchen triamcinolone cream (KENALOG) 0.1 % Apply 1 application topically 2 (two) times daily.  . vitamin B-12 (CYANOCOBALAMIN) 1000 MCG tablet Take 1,000 mcg by mouth daily.   No facility-administered encounter medications on file as of 01/09/2020.    Current Diagnosis: Patient Active Problem List   Diagnosis Date Noted  . Contact dermatitis 08/30/2019  . GERD without esophagitis 01/05/2019  . Pharyngeal dysphagia 01/04/2019  . Urinary frequency 01/04/2019  . Visit for screening mammogram 01/04/2019  . Essential hypertension 02/13/2018    . Paroxysmal atrial fibrillation ([place]) 08/23/2017  . Coronary artery disease involving native coronary artery of native heart without angina pectoris 02/24/2017  . Pure hypercholesterolemia 02/24/2017  . Hypertrophic cardiomyopathy ([place]) 11/22/2016  . Paroxysmal atrial flutter ([place]) 07/08/2016  . Varicose veins of lower extremities with other complications 01/60/1093  . Allergic rhinitis 12/28/2012  . OAB (overactive bladder) 06/22/2012  . Routine general medical examination at a health care facility 06/22/2012  . Moderate aortic stenosis 09/05/2010  . Coronary artery disease due to lipid rich plaque 06/26/2009  . SUPRAVENTRICULAR TACHYCARDIA 06/23/2009  . Hyperlipidemia 03/06/2009  . Osteoporosis 03/06/2009    Goals Addressed   None     Follow-Up:  Pharmacist Review    Called to follow up with the patient to see if she heard back from PAP for her eliquis. The patient stated that she heard from them last we day week and was informed that she has been approved for the program. They will be sending her out a 180 day supply of medication. The patient was also informed that her approval end date is 04/25/2020. Next year she will have to see if she is out of the donut hole with her insurance company.   Rosendo Gros, [place] Pharmacist Assistant  [phone]

## 2020-01-31 DIAGNOSIS — R69 Illness, unspecified: Secondary | ICD-10-CM | POA: Diagnosis not present

## 2020-02-14 ENCOUNTER — Ambulatory Visit (INDEPENDENT_AMBULATORY_CARE_PROVIDER_SITE_OTHER): Payer: Medicare HMO

## 2020-02-14 ENCOUNTER — Other Ambulatory Visit: Payer: Self-pay

## 2020-02-14 DIAGNOSIS — M810 Age-related osteoporosis without current pathological fracture: Secondary | ICD-10-CM

## 2020-02-14 MED ORDER — DENOSUMAB 60 MG/ML ~~LOC~~ SOSY
60.0000 mg | PREFILLED_SYRINGE | Freq: Once | SUBCUTANEOUS | Status: AC
  Administered 2020-02-14: 60 mg via SUBCUTANEOUS

## 2020-02-14 NOTE — Progress Notes (Signed)
Prolia 60mg  given subcutaneous in left upper arm per Dr Ronnald Ramp order.  Pt tolerated well.

## 2020-03-11 ENCOUNTER — Telehealth: Payer: Self-pay | Admitting: Cardiology

## 2020-03-11 NOTE — Telephone Encounter (Signed)
1. What dental office are you calling from? Dr. P[facility]   2. What is your office phone number? [phone]  3. What is your fax number? [phone]  4. What type of procedure is the patient having performed? Extraction of number 18   5. What date is procedure scheduled or is the patient there now? 03/12/20 at 9:00 AM (if the patient is at the dentist's office question goes to their cardiologist if he/she is in the office.  If not, question should go to the DOD).   6. What is your question (ex. Antibiotics prior to procedure, holding medication-we need to know how long dentist wants pt to hold med)? If she needs to be hold blood thinners, and if so how long prior.

## 2020-03-11 NOTE — Telephone Encounter (Signed)
No need to hold anticoagulation for one tooth extraction

## 2020-04-10 ENCOUNTER — Ambulatory Visit: Payer: Medicare HMO | Admitting: Cardiology

## 2020-04-25 ENCOUNTER — Other Ambulatory Visit: Payer: Self-pay | Admitting: Internal Medicine

## 2020-04-28 NOTE — Telephone Encounter (Signed)
Refill request

## 2020-05-08 DIAGNOSIS — H524 Presbyopia: Secondary | ICD-10-CM | POA: Diagnosis not present

## 2020-05-08 DIAGNOSIS — Z961 Presence of intraocular lens: Secondary | ICD-10-CM | POA: Diagnosis not present

## 2020-05-21 ENCOUNTER — Encounter: Payer: Self-pay | Admitting: Cardiology

## 2020-05-21 ENCOUNTER — Other Ambulatory Visit: Payer: Self-pay

## 2020-05-21 ENCOUNTER — Ambulatory Visit: Payer: Medicare HMO | Admitting: Cardiology

## 2020-05-21 VITALS — BP 130/72 | HR 69 | Ht 61.0 in | Wt 127.6 lb

## 2020-05-21 DIAGNOSIS — I2510 Atherosclerotic heart disease of native coronary artery without angina pectoris: Secondary | ICD-10-CM | POA: Diagnosis not present

## 2020-05-21 DIAGNOSIS — I350 Nonrheumatic aortic (valve) stenosis: Secondary | ICD-10-CM

## 2020-05-21 DIAGNOSIS — E785 Hyperlipidemia, unspecified: Secondary | ICD-10-CM | POA: Diagnosis not present

## 2020-05-21 DIAGNOSIS — I10 Essential (primary) hypertension: Secondary | ICD-10-CM | POA: Diagnosis not present

## 2020-05-21 DIAGNOSIS — I4892 Unspecified atrial flutter: Secondary | ICD-10-CM

## 2020-05-21 NOTE — Progress Notes (Signed)
Cardiology Office Note:    Date:  05/21/2020   ID:  Carrie, [name] 08-02-1945, MRN [id number]  PCP:  Carrie Lima, MD  Cardiologist:  [name], MD  Electrophysiologist:  None   Referring MD: Carrie Lima, MD   Reason for visit: 6 months follow-up  History of Present Illness:    Carrie [name] is a 75 y.o. female with a hx of with history of nonobstructive CAD, hypertrophic cardiomyopathy, paroxysmal atrial flutter, and aortic stenosis (moderate by echo in 01/2016), who presents for follow-up of chest pain.    Echo in October 2019 showed normal LVEF with moderate AS, mild AI, and mild to moderate MR.    Past Medical History:  Diagnosis Date  . Aortic stenosis   . Coronary artery disease    a. Prior LHC (07/24/03): LMCA heavily calcified without significant stenosis, LAD with heavy calcification proximally. 50-60% stenosis just distal to D2, LCx calcified proximally without significant stenosis, Dominant RCA with moderate proximal calcification and mild luminal irregularities throughout, LVEDP 12, LVEF 70%  . Diastolic dysfunction without heart failure   . Former tobacco use   . Former tobacco use   . Heart murmur   . HOCM (hypertrophic obstructive cardiomyopathy) ([place])   . Mild AI (aortic insufficiency)   . Osteoporosis, unspecified   . Other and unspecified hyperlipidemia   . Paroxysmal atrial flutter ([place])   . Pneumonia   . Wears hearing aid    left    Past Surgical History:  Procedure Laterality Date  . ATRIAL ABLATION SURGERY  2005  . BREAST SURGERY  1982   Benign tumor removed RT  . BUNIONECTOMY  04/07/2012   Procedure: Lillard Anes;  Surgeon: Alta Corning, MD;  Location: [facility];  Service: Orthopedics;  Laterality: Left;  CHEVRON OSTEOTOMY LEFT FOOT   . CARDIAC CATHETERIZATION  2005  . GANGLION CYST EXCISION     left wrist  . HERNIA REPAIR  1990   rt ing  . TONSILLECTOMY    . TUBAL LIGATION      Current  Medications: Current Meds  Medication Sig  . CALCIUM PO Take 1 tablet by mouth daily.   . Cholecalciferol (VITAMIN D3) 1000 UNITS CAPS Take 1 capsule by mouth daily.  Marland Kitchen denosumab (PROLIA) 60 MG/ML SOSY injection Inject 60 mg into the skin every 6 (six) months.  Marland Kitchen ELIQUIS 5 MG TABS tablet Take 5 mg by mouth 2 (two) times daily.  . fish oil-omega-3 fatty acids 1000 MG capsule Take 1 g by mouth daily.  . folic acid (FOLVITE) 1 MG tablet Take 1 mg by mouth daily.  . metoprolol succinate (TOPROL-XL) 50 MG 24 hr tablet Take 1 tablet (50 mg total) by mouth daily. Take with or immediately following a meal.  . nitroGLYCERIN (NITROLINGUAL) 0.4 MG/SPRAY spray Place 1 spray under the tongue every 5 (five) minutes x 3 doses as needed.   Marland Kitchen omeprazole (PRILOSEC) 40 MG capsule Take 40 mg by mouth daily.  . rosuvastatin (CRESTOR) 20 MG tablet TAKE 1 TABLET BY MOUTH EVERY DAY  . Thiamine HCl (VITAMIN B-1) 250 MG tablet Take 500 mg by mouth daily.  Marland Kitchen triamcinolone cream (KENALOG) 0.1 % Apply 1 application topically 2 (two) times daily.  . vitamin B-12 (CYANOCOBALAMIN) 1000 MCG tablet Take 1,000 mcg by mouth daily.     Allergies:   Codeine, Crestor [rosuvastatin], and Latex   Social History   Socioeconomic History  . Marital status: Divorced  Spouse name: Not on file  . Number of children: 2  . Years of education: Not on file  . Highest education level: Not on file  Occupational History  . Occupation: retired  Tobacco Use  . Smoking status: Former Smoker    Quit date: 04/05/1999    Years since quitting: 21.1  . Smokeless tobacco: Never Used  . Tobacco comment: Regular Exercise-No  Vaping Use  . Vaping Use: Never used  Substance and Sexual Activity  . Alcohol use: Yes    Alcohol/week: 7.0 standard drinks    Types: 7 Glasses of wine per week    Comment: 1 drink per week  . Drug use: No  . Sexual activity: Not on file  Other Topics Concern  . Not on file  Social History Narrative  . Not on  file   Social Determinants of Health   Financial Resource Strain: Medium Risk  . Difficulty of Paying Living Expenses: Somewhat hard  Food Insecurity: Not on file  Transportation Needs: No Transportation Needs  . Lack of Transportation (Medical): No  . Lack of Transportation (Non-Medical): No  Physical Activity: Not on file  Stress: Not on file  Social Connections: Not on file    Family History: The patient's family history includes Congestive Heart Failure in her mother; Dementia in her mother; Heart disease (age of onset: 18) in her mother; Hypertension in an other family member; Valvular heart disease in her father. There is no history of Colon cancer or Rectal cancer.  ROS:   Please see the history of present illness.    All other systems reviewed and are negative.  EKGs/Labs/Other Studies Reviewed:    The following studies were reviewed today:  EKG:  EKG is ordered today.  The ekg ordered today demonstrates normal sinus rhythm, borderline LVH, otherwise normal unchanged from prior.  This was personally reviewed.  Recent Labs: No results found for requested labs within last 8760 hours.   Recent Lipid Panel    Component Value Date/Time   CHOL 146 03/19/2019 1051   TRIG 160 (H) 03/19/2019 1051   HDL 54 03/19/2019 1051   CHOLHDL 2.7 03/19/2019 1051   CHOLHDL 2.3 08/18/2015 1030   VLDL 22 08/18/2015 1030   LDLCALC 65 03/19/2019 1051   LDLDIRECT 133.8 12/19/2012 0828   Physical Exam:    VS:  BP 130/72   Pulse 69   Ht 5' 1"  (1.549 m)   Wt 127 lb 9.6 oz (57.9 kg)   SpO2 97%   BMI 24.11 kg/m     Wt Readings from Last 3 Encounters:  05/21/20 127 lb 9.6 oz (57.9 kg)  10/12/19 127 lb 9.6 oz (57.9 kg)  08/30/19 124 lb (56.2 kg)    GEN:  Well nourished, well developed in no acute distress HEENT: Normal NECK: No JVD; No carotid bruits LYMPHATICS: No lymphadenopathy CARDIAC: RRR, 5 out of 6 holosystolic S2 present, 3/6 diastolic murmur, no rubs, gallops RESPIRATORY:   Clear to auscultation without rales, wheezing or rhonchi  ABDOMEN: Soft, non-tender, non-distended MUSCULOSKELETAL:  No edema; No deformity  SKIN: Warm and dry NEUROLOGIC:  Alert and oriented x 3 PSYCHIATRIC:  Normal affect   TTE: 09/19/2019  1. Left ventricular ejection fraction, by estimation, is 65 to 70%. The  left ventricle has normal function. The left ventricle has no regional  wall motion abnormalities. There is severe asymmetric left ventricular  hypertrophy of the basal-septal  segment. Dynamic LVOT obstruction, peak gradient 23 mmHg. Left ventricular  diastolic  parameters are consistent with Grade II diastolic dysfunction  (pseudonormalization). Elevated left atrial pressure.  2. Right ventricular systolic function is normal. The right ventricular  size is normal. There is normal pulmonary artery systolic pressure. The  estimated right ventricular systolic pressure is 32.9 mmHg.  3. Left atrial size was mildly dilated.  4. The mitral valve is abnormal. Sevevere mitral annular calcification.  Moderate mitral valve regurgitation.  5. The inferior vena cava is normal in size with greater than 50%  respiratory variability, suggesting right atrial pressure of 3 mmHg.  6. The aortic valve is tricuspid. Severe leaflet  thickening/calcification. Aortic valve regurgitation is moderate.  Concomitant LVOT obstruction complicates quantification of aortic  stenosis, but appears moderate to severe (Vmax 4.2 m/s, MG 34 mmHg, AVA  0.9 cm^2, DI 0.4).    ASSESSMENT:    1. Essential hypertension   2. Coronary artery disease involving native coronary artery of native heart without angina pectoris   3. Hyperlipidemia, unspecified hyperlipidemia type   4. Paroxysmal atrial flutter (HCC)   5. Severe aortic stenosis     PLAN:    In order of problems listed above:  Nonobstructive coronary artery disease No symptoms , she remains very active and asymptomatic walks a mile every day.   She is unchanged.  Aortic stenosis Most recent echocardiogram from May 2021 shows moderate aortic regurgitation, moderate to severe aortic stenosis Vmax 4.2 m/s, MG 34 mmHg, AVA 0.9 cm^2, DI 0.4, she remains active and asymptomatic, she has a very subtle S2.  She is asymptomatic, we will repeat her echo in 2 months and follow her in 4 months.  Atrial flutter No symptoms to suggest recurrence.  EKG today demonstrates sinus rhythm. No bleeding with Eliquis,  Hemoglobin 12.5.  Hypertrophic cardiomyopathy No syncope or other symptoms.  I recommend continued beta-blockade with target heart rate in the 60s.  No further intervention at this time.  Hypertension Blood pressure well controlled.  Continue current medications.  Hyperlipidemia As above, normal LFTs.  Medication Adjustments/Labs and Tests Ordered: Current medicines are reviewed at length with the patient today.  Concerns regarding medicines are outlined above.  Orders Placed This Encounter  Procedures  . Comp Met (CMET)  . CBC  . TSH  . Lipid Profile  . EKG 12-Lead  . ECHOCARDIOGRAM COMPLETE   No orders of the defined types were placed in this encounter.   Patient Instructions  Medication Instructions:   Your physician recommends that you continue on your current medications as directed. Please refer to the Current Medication list given to you today.  *If you need a refill on your cardiac medications before your next appointment, please call your pharmacy*   Lab Work:  TODAY--CMET, CBC, TSH, AND LIPIDS  If you have labs (blood work) drawn today and your tests are completely normal, you will receive your results only by: Marland Kitchen MyChart Message (if you have MyChart) OR . A paper copy in the mail If you have any lab test that is abnormal or we need to change your treatment, we will call you to review the results.   Testing/Procedures:  Your physician has requested that you have an echocardiogram. Echocardiography is  a painless test that uses sound waves to create images of your heart. It provides your doctor with information about the size and shape of your heart and how well your heart's chambers and valves are working. This procedure takes approximately one hour. There are no restrictions for this procedure. PATIENT NEEDS ECHO SCHEDULED  A WEEK PRIOR TO HER 4 MONTH FOLLOW-UP APPOINTMENT WITH AN EXTENDER PER DR. Meda Coffee   Follow-Up:  4 MONTHS IN THE OFFICE WITH AN EXTENDER--NEEDS TO HAVE ECHO SCHEDULED A WEEK PRIOR TO THIS VISIT      Signed, [name], MD  05/21/2020 10:43 AM    [place]

## 2020-05-21 NOTE — Patient Instructions (Signed)
Medication Instructions:   Your physician recommends that you continue on your current medications as directed. Please refer to the Current Medication list given to you today.  *If you need a refill on your cardiac medications before your next appointment, please call your pharmacy*   Lab Work:  TODAY--CMET, CBC, TSH, AND LIPIDS  If you have labs (blood work) drawn today and your tests are completely normal, you will receive your results only by: Marland Kitchen MyChart Message (if you have MyChart) OR . A paper copy in the mail If you have any lab test that is abnormal or we need to change your treatment, we will call you to review the results.   Testing/Procedures:  Your physician has requested that you have an echocardiogram. Echocardiography is a painless test that uses sound waves to create images of your heart. It provides your doctor with information about the size and shape of your heart and how well your heart's chambers and valves are working. This procedure takes approximately one hour. There are no restrictions for this procedure. PATIENT NEEDS ECHO SCHEDULED A WEEK PRIOR TO HER 4 MONTH FOLLOW-UP APPOINTMENT WITH AN EXTENDER PER DR. Meda Coffee   Follow-Up:  4 MONTHS IN THE OFFICE WITH AN EXTENDER--NEEDS TO HAVE ECHO SCHEDULED A WEEK PRIOR TO THIS VISIT

## 2020-05-22 DIAGNOSIS — Z01419 Encounter for gynecological examination (general) (routine) without abnormal findings: Secondary | ICD-10-CM | POA: Diagnosis not present

## 2020-05-22 LAB — COMPREHENSIVE METABOLIC PANEL
ALT: 21 IU/L (ref 0–32)
AST: 27 IU/L (ref 0–40)
Albumin/Globulin Ratio: 1.5 (ref 1.2–2.2)
Albumin: 4.3 g/dL (ref 3.7–4.7)
Alkaline Phosphatase: 81 IU/L (ref 44–121)
BUN/Creatinine Ratio: 14 (ref 12–28)
BUN: 8 mg/dL (ref 8–27)
Bilirubin Total: 0.5 mg/dL (ref 0.0–1.2)
CO2: 24 mmol/L (ref 20–29)
Calcium: 9 mg/dL (ref 8.7–10.3)
Chloride: 103 mmol/L (ref 96–106)
Creatinine, Ser: 0.56 mg/dL — ABNORMAL LOW (ref 0.57–1.00)
GFR calc Af Amer: 105 mL/min/{1.73_m2} (ref >59)
GFR calc non Af Amer: 92 mL/min/{1.73_m2} (ref >59)
Globulin, Total: 2.8 g/dL (ref 1.5–4.5)
Glucose: 89 mg/dL (ref 65–99)
Potassium: 4.2 mmol/L (ref 3.5–5.2)
Sodium: 140 mmol/L (ref 134–144)
Total Protein: 7.1 g/dL (ref 6.0–8.5)

## 2020-05-22 LAB — CBC
Hematocrit: 39.3 % (ref 34.0–46.6)
Hemoglobin: 13.5 g/dL (ref 11.1–15.9)
MCH: 31.5 pg (ref 26.6–33.0)
MCHC: 34.4 g/dL (ref 31.5–35.7)
MCV: 92 fL (ref 79–97)
Platelets: 305 10*3/uL (ref 150–450)
RBC: 4.28 x10E6/uL (ref 3.77–5.28)
RDW: 12.4 % (ref 11.7–15.4)
WBC: 7.1 10*3/uL (ref 3.4–10.8)

## 2020-05-22 LAB — LIPID PANEL
Chol/HDL Ratio: 2.6 ratio (ref 0.0–4.4)
Cholesterol, Total: 199 mg/dL (ref 100–199)
HDL: 78 mg/dL (ref >39)
LDL Chol Calc (NIH): 92 mg/dL (ref 0–99)
Triglycerides: 175 mg/dL — ABNORMAL HIGH (ref 0–149)
VLDL Cholesterol Cal: 29 mg/dL (ref 5–40)

## 2020-05-22 LAB — TSH: TSH: 1.15 u[IU]/mL (ref 0.450–4.500)

## 2020-05-23 ENCOUNTER — Other Ambulatory Visit: Payer: Self-pay | Admitting: Cardiology

## 2020-06-05 ENCOUNTER — Telehealth: Payer: Self-pay

## 2020-06-05 NOTE — Telephone Encounter (Addendum)
**Note De-Identified Christiona Siddique Obfuscation** The pt left her completed [employer] application at the office with documents. I have completed the MD page of the application and emailed all to Dr Joanie Coddington nurse so she can obtain  Dr Devin Going (DOD) signature as Dr Delton See is out of the office until 2/17, date it, and to fax all to [facility] at fax number written on cover letter included.

## 2020-06-06 NOTE — Telephone Encounter (Signed)
Pt assistance form printed off and given to the DOD Dr. Johney Frame, to sign and date, in absence of Dr. Meda Coffee. Forms signed and dated.  Will place in nurse fax box in medical records, to fax accordingly back to [employer].

## 2020-06-12 ENCOUNTER — Encounter: Payer: Self-pay | Admitting: Gastroenterology

## 2020-06-12 ENCOUNTER — Telehealth: Payer: Medicare HMO

## 2020-06-18 NOTE — Telephone Encounter (Signed)
We received a fax from [facility] stating that the following information was missing from pt application, however nothing was listed.

## 2020-06-19 NOTE — Telephone Encounter (Signed)
**Note De-Identified  Obfuscation** I called [employer] and s/w Megan. Megan placed me on a brief hold while she checked the pts application.  Per Jinny Blossom she sees the denial letter that was faxed to [place] but states that it is a mistake as it does not explain what is missing. She checked the pts application that we faxed to them on 2/11 and states that it is complete with nothing missing and that she has re-sent the pts application to processing with a note written on it that nothing is missing.  She states that we will receive and fax and the pt a letter once their determination is finalized.

## 2020-06-30 NOTE — Telephone Encounter (Signed)
Received a letter from [employer] that pt did not qualify for pt assistance because they had not met the 3% out of pocket prescription expenses yet.

## 2020-07-04 ENCOUNTER — Other Ambulatory Visit: Payer: Self-pay | Admitting: Cardiology

## 2020-07-04 DIAGNOSIS — I2510 Atherosclerotic heart disease of native coronary artery without angina pectoris: Secondary | ICD-10-CM

## 2020-08-18 ENCOUNTER — Telehealth: Payer: Self-pay | Admitting: Pharmacist

## 2020-08-18 NOTE — Chronic Care Management (AMB) (Signed)
Chronic Care Management Pharmacy Assistant   Name: Mariene Dickerman  MRN: [id number] DOB: 05/24/1945   Reason for Encounter: Hypertension Disease State Call   Conditions to be addressed/monitored: HTN   Recent office visits:  None ID  Recent consult visits:  05/21/20 Dr. [name], no med changes  Hospital visits:  None in previous 6 months  Medications: Outpatient Encounter Medications as of 08/18/2020  Medication Sig  . CALCIUM PO Take 1 tablet by mouth daily.   . Cholecalciferol (VITAMIN D3) 1000 UNITS CAPS Take 1 capsule by mouth daily.  Marland Kitchen denosumab (PROLIA) 60 MG/ML SOSY injection Inject 60 mg into the skin every 6 (six) months.  Marland Kitchen ELIQUIS 5 MG TABS tablet Take 5 mg by mouth 2 (two) times daily.  . fish oil-omega-3 fatty acids 1000 MG capsule Take 1 g by mouth daily.  . folic acid (FOLVITE) 1 MG tablet Take 1 mg by mouth daily.  . metoprolol succinate (TOPROL-XL) 50 MG 24 hr tablet TAKE 1 TABLET (50 MG TOTAL) BY MOUTH DAILY. TAKE WITH OR IMMEDIATELY FOLLOWING A MEAL.  . nitroGLYCERIN (NITROLINGUAL) 0.4 MG/SPRAY spray Place 1 spray under the tongue every 5 (five) minutes x 3 doses as needed.   Marland Kitchen omeprazole (PRILOSEC) 40 MG capsule Take 40 mg by mouth daily.  . rosuvastatin (CRESTOR) 20 MG tablet TAKE 1 TABLET BY MOUTH EVERY DAY  . Thiamine HCl (VITAMIN B-1) 250 MG tablet Take 500 mg by mouth daily.  Marland Kitchen triamcinolone cream (KENALOG) 0.1 % Apply 1 application topically 2 (two) times daily.  . vitamin B-12 (CYANOCOBALAMIN) 1000 MCG tablet Take 1,000 mcg by mouth daily.   No facility-administered encounter medications on file as of 08/18/2020.    Reviewed chart prior to disease state call. Spoke with patient regarding BP  Recent Office Vitals: BP Readings from Last 3 Encounters:  05/21/20 130/72  10/12/19 132/78  08/30/19 130/80   Pulse Readings from Last 3 Encounters:  05/21/20 69  10/12/19 (!) 111  08/30/19 71    Wt Readings from Last 3 Encounters:   05/21/20 127 lb 9.6 oz (57.9 kg)  10/12/19 127 lb 9.6 oz (57.9 kg)  08/30/19 124 lb (56.2 kg)     Kidney Function Lab Results  Component Value Date/Time   CREATININE 0.56 (L) 05/21/2020 10:24 AM   CREATININE 0.67 03/19/2019 10:51 AM   CREATININE 0.75 03/16/2016 10:52 AM   GFR 98.54 01/03/2018 11:20 AM   GFRNONAA 92 05/21/2020 10:24 AM   GFRAA 105 05/21/2020 10:24 AM    BMP Latest Ref Rng & Units 05/21/2020 03/19/2019 01/03/2018  Glucose 65 - 99 mg/dL 89 92 86  BUN 8 - 27 mg/dL 8 6(L) 14  Creatinine 0.57 - 1.00 mg/dL 0.56(L) 0.67 0.63  BUN/Creat Ratio 12 - 28 14 9(L) -  Sodium 134 - 144 mmol/L 140 141 139  Potassium 3.5 - 5.2 mmol/L 4.2 4.0 4.3  Chloride 96 - 106 mmol/L 103 102 103  CO2 20 - 29 mmol/L 24 24 28   Calcium 8.7 - 10.3 mg/dL 9.0 9.0 9.8    . Current antihypertensive regimen: Patient states that this medication is to keep her heart regulated Metoprolol suc 25 mg daily and  eliquis 5 mg twice daily . How often are you checking your Blood Pressure? Patient states that she checks her blood pressure when she thinks she needs to  . Current home BP readings: Patient blood pressure this morning was 117/69  . What recent interventions/DTPs have been made by  any provider to improve Blood Pressure control since last CPP Visit: Continue current medications, per Dr. Meda Coffee  . Any recent hospitalizations or ED visits since last visit with CPP?None ID  . What diet changes have been made to improve Blood Pressure Control?  Patient states that she is not a big eater, eats very lite  . What exercise is being done to improve your Blood Pressure Control?  Patient states that she walks about a 1 mile a day  Adherence Review: Is the patient currently on ACE/ARB medication? No Does the patient have >5 day gap between last estimated fill dates? No    Star Rating Drugs:  Ethelene Hal Clinical Pharmacist Assistant [phone]  Time spent:30

## 2020-08-28 ENCOUNTER — Telehealth: Payer: Self-pay | Admitting: Internal Medicine

## 2020-08-28 NOTE — Telephone Encounter (Signed)
Thinks its time for her prolia, needs insurance authorization

## 2020-08-29 NOTE — Telephone Encounter (Signed)
Patient is authorized to get prolia injection Ref# [id number] Dates: 8.19.21 thru 8.19.22 Per John at [facility] [id number]  Patient coming for her injection on Monday 5.9.22

## 2020-08-31 NOTE — Progress Notes (Signed)
Subjective:    Patient ID: Carrie [name], female    DOB: 1946/03/02, 75 y.o.   MRN: [id number]  HPI The patient is here for an acute visit.   Swallowing issues. Sometimes she feels like she has a lump in her throat when she goes to swallow. She has had this on occasion, but last week it was worse.  She takes omeprazole 40 mg daily.  She feels her heartburn is well controlled.  It does not matter how much she chews -it occur more with bread - it does not help with drinking liquids.  It occurs occasionally with eating chicken..  She has a thick heavy, clear mucus - often occurs at night .      She denies heartburn, gerd, increased burping, abdominal pain or nausea.  No coughing with eating or after eating.      EGD 02/09/19 - no cause for dysphagia.  Esophagus was dilated.  Modified barium swallow eval - decreased epiglottic deflection and vallecular retention. Anterior curvature of spine may contribute.  Advised by speech to start meal with liquids, no change in diet, follow each bite with liquid or pudding.     She is due for a prolia injection.  She is taking calcium and vitamin d daily.  She is exercising-walking.     Medications and allergies reviewed with patient and updated if appropriate.  Patient Active Problem List   Diagnosis Date Noted  . Contact dermatitis 08/30/2019  . GERD without esophagitis 01/05/2019  . Pharyngeal dysphagia 01/04/2019  . Urinary frequency 01/04/2019  . Visit for screening mammogram 01/04/2019  . Essential hypertension 02/13/2018  . Paroxysmal atrial fibrillation ([place]) 08/23/2017  . Coronary artery disease involving native coronary artery of native heart without angina pectoris 02/24/2017  . Pure hypercholesterolemia 02/24/2017  . Hypertrophic cardiomyopathy ([place]) 11/22/2016  . Paroxysmal atrial flutter ([place]) 07/08/2016  . Varicose veins of lower extremities with other complications 13/11/6576  . Allergic rhinitis 12/28/2012  . OAB  (overactive bladder) 06/22/2012  . Routine general medical examination at a health care facility 06/22/2012  . Moderate aortic stenosis 09/05/2010  . Coronary artery disease due to lipid rich plaque 06/26/2009  . SUPRAVENTRICULAR TACHYCARDIA 06/23/2009  . Hyperlipidemia 03/06/2009  . Osteoporosis 03/06/2009    Current Outpatient Medications on File Prior to Visit  Medication Sig Dispense Refill  . CALCIUM PO Take 1 tablet by mouth daily.     . Cholecalciferol (VITAMIN D3) 1000 UNITS CAPS Take 1 capsule by mouth daily.    Marland Kitchen denosumab (PROLIA) 60 MG/ML SOSY injection Inject 60 mg into the skin every 6 (six) months.    Marland Kitchen ELIQUIS 5 MG TABS tablet Take 5 mg by mouth 2 (two) times daily.    . fish oil-omega-3 fatty acids 1000 MG capsule Take 1 g by mouth daily.    . folic acid (FOLVITE) 1 MG tablet Take 1 mg by mouth daily.    . metoprolol succinate (TOPROL-XL) 50 MG 24 hr tablet TAKE 1 TABLET (50 MG TOTAL) BY MOUTH DAILY. TAKE WITH OR IMMEDIATELY FOLLOWING A MEAL. 90 tablet 2  . nitroGLYCERIN (NITROLINGUAL) 0.4 MG/SPRAY spray Place 1 spray under the tongue every 5 (five) minutes x 3 doses as needed.     Marland Kitchen omeprazole (PRILOSEC) 40 MG capsule Take 40 mg by mouth daily.    . rosuvastatin (CRESTOR) 20 MG tablet TAKE 1 TABLET BY MOUTH EVERY DAY 90 tablet 3  . Thiamine HCl (VITAMIN B-1) 250 MG tablet Take 500  mg by mouth daily.    . vitamin B-12 (CYANOCOBALAMIN) 1000 MCG tablet Take 1,000 mcg by mouth daily.     No current facility-administered medications on file prior to visit.    Past Medical History:  Diagnosis Date  . Aortic stenosis   . Coronary artery disease    a. Prior LHC (07/24/03): LMCA heavily calcified without significant stenosis, LAD with heavy calcification proximally. 50-60% stenosis just distal to D2, LCx calcified proximally without significant stenosis, Dominant RCA with moderate proximal calcification and mild luminal irregularities throughout, LVEDP 12, LVEF 70%  .  Diastolic dysfunction without heart failure   . Former tobacco use   . Former tobacco use   . Heart murmur   . HOCM (hypertrophic obstructive cardiomyopathy) ([place])   . Mild AI (aortic insufficiency)   . Osteoporosis, unspecified   . Other and unspecified hyperlipidemia   . Paroxysmal atrial flutter ([place])   . Pneumonia   . Wears hearing aid    left    Past Surgical History:  Procedure Laterality Date  . ATRIAL ABLATION SURGERY  2005  . BREAST SURGERY  1982   Benign tumor removed RT  . BUNIONECTOMY  04/07/2012   Procedure: Lillard Anes;  Surgeon: Alta Corning, MD;  Location: [facility];  Service: Orthopedics;  Laterality: Left;  CHEVRON OSTEOTOMY LEFT FOOT   . CARDIAC CATHETERIZATION  2005  . GANGLION CYST EXCISION     left wrist  . HERNIA REPAIR  1990   rt ing  . TONSILLECTOMY    . TUBAL LIGATION      Social History   Socioeconomic History  . Marital status: Divorced    Spouse name: Not on file  . Number of children: 2  . Years of education: Not on file  . Highest education level: Not on file  Occupational History  . Occupation: retired  Tobacco Use  . Smoking status: Former Smoker    Quit date: 04/05/1999    Years since quitting: 21.4  . Smokeless tobacco: Never Used  . Tobacco comment: Regular Exercise-No  Vaping Use  . Vaping Use: Never used  Substance and Sexual Activity  . Alcohol use: Yes    Alcohol/week: 7.0 standard drinks    Types: 7 Glasses of wine per week    Comment: 1 drink per week  . Drug use: No  . Sexual activity: Not on file  Other Topics Concern  . Not on file  Social History Narrative  . Not on file   Social Determinants of Health   Financial Resource Strain: Medium Risk  . Difficulty of Paying Living Expenses: Somewhat hard  Food Insecurity: Not on file  Transportation Needs: Not on file  Physical Activity: Not on file  Stress: Not on file  Social Connections: Not on file    Family History  Problem Relation  Age of Onset  . Heart disease Mother 43  . Congestive Heart Failure Mother   . Dementia Mother   . Hypertension Other   . Valvular heart disease Father        Had valves replaced  . Colon cancer Neg Hx   . Rectal cancer Neg Hx     Review of Systems  Constitutional: Negative for fever.  HENT: Positive for trouble swallowing (no pain with swallowing). Negative for sore throat.   Respiratory: Positive for cough (occ - at night or day - coughs up clear mucus).   Gastrointestinal: Negative for abdominal pain and nausea.  Denies gerd       Objective:   Vitals:   09/01/20 1314  BP: 138/68  Pulse: 80  Temp: 98.2 F (36.8 C)  SpO2: 96%   BP Readings from Last 3 Encounters:  09/01/20 138/68  05/21/20 130/72  10/12/19 132/78   Wt Readings from Last 3 Encounters:  09/01/20 128 lb 3.2 oz (58.2 kg)  05/21/20 127 lb 9.6 oz (57.9 kg)  10/12/19 127 lb 9.6 oz (57.9 kg)   Body mass index is 24.22 kg/m.   Physical Exam Constitutional:      General: She is not in acute distress.    Appearance: Normal appearance. She is not ill-appearing.  HENT:     Head: Normocephalic and atraumatic.     Mouth/Throat:     Mouth: Mucous membranes are moist.     Pharynx: No oropharyngeal exudate or posterior oropharyngeal erythema.  Cardiovascular:     Rate and Rhythm: Normal rate and regular rhythm.     Heart sounds: Murmur heard.    Pulmonary:     Effort: Pulmonary effort is normal. No respiratory distress.     Breath sounds: No wheezing or rales.  Abdominal:     General: There is no distension.     Palpations: Abdomen is soft. There is no mass.     Tenderness: There is no abdominal tenderness. There is no guarding or rebound.  Skin:    General: Skin is warm and dry.  Neurological:     Mental Status: She is alert.            Assessment & Plan:     Osteoporosis: Chronic Taking calcium and vitamin D daily Walks a mile daily to every other day Receiving Prolia  injections every 6 months and she is due today Prolia 60 mg subcutaneous injection today DEXA due-ordered  Pharyngeal dysphagia, GERD: Presents with a feeling of a lump in her throat and difficulty swallowing at times which happens on occasion, but is worse in the past week Taking omeprazole 40 mg daily and it sounds like her GERD is actually well controlled Has seen GI and had a modified barium swallow done couple of years ago, which revealed pharyngeal dysphagia and that is likely was causing her symptoms now Discussed that we could undergo further evaluation again with GI or modified barium swallow and she declined both for now She was advised by speech to start each meal with liquid and follow each bite with liquid, which she is not doing religiously and will try to do Continue omeprazole 40 mg daily If her symptoms do not improve with the above discussed that she will need further evaluation    See Problem List for Assessment and Plan of chronic medical problems.    This visit occurred during the SARS-CoV-2 public health emergency.  Safety protocols were in place, including screening questions prior to the visit, additional usage of staff PPE, and extensive cleaning of exam room while observing appropriate contact time as indicated for disinfecting solutions.

## 2020-09-01 ENCOUNTER — Ambulatory Visit (INDEPENDENT_AMBULATORY_CARE_PROVIDER_SITE_OTHER): Payer: Medicare HMO | Admitting: Internal Medicine

## 2020-09-01 ENCOUNTER — Other Ambulatory Visit: Payer: Self-pay

## 2020-09-01 ENCOUNTER — Encounter: Payer: Self-pay | Admitting: Internal Medicine

## 2020-09-01 VITALS — BP 138/68 | HR 80 | Temp 98.2°F | Ht 61.0 in | Wt 128.2 lb

## 2020-09-01 DIAGNOSIS — M810 Age-related osteoporosis without current pathological fracture: Secondary | ICD-10-CM

## 2020-09-01 DIAGNOSIS — K219 Gastro-esophageal reflux disease without esophagitis: Secondary | ICD-10-CM

## 2020-09-01 DIAGNOSIS — R1313 Dysphagia, pharyngeal phase: Secondary | ICD-10-CM

## 2020-09-01 MED ORDER — DENOSUMAB 60 MG/ML ~~LOC~~ SOSY
60.0000 mg | PREFILLED_SYRINGE | Freq: Once | SUBCUTANEOUS | Status: AC
  Administered 2020-09-01: 60 mg via SUBCUTANEOUS

## 2020-09-01 MED ORDER — DENOSUMAB 60 MG/ML ~~LOC~~ SOSY: 60.0000 mg | PREFILLED_SYRINGE | Freq: Once | SUBCUTANEOUS | 0 refills | Status: AC

## 2020-09-01 NOTE — Patient Instructions (Addendum)
  You received a prolia injection today.    A bone density test was ordered    Drink water before each bite and after eat bite of food.  If your symptoms do not improve or worsen let us know so we can evaluate further.

## 2020-09-01 NOTE — Assessment & Plan Note (Deleted)
Chronic Taking calcium and vitamin d daily She walks about 1 mile daily -  qod prolia injections Q 6 months -- due today  - will give today dexa due - ordered

## 2020-09-08 ENCOUNTER — Telehealth: Payer: Self-pay | Admitting: Pharmacist

## 2020-09-08 NOTE — Progress Notes (Signed)
    Chronic Care Management Pharmacy Assistant   Name: Carrie [name]  MRN: [id number] DOB: 08-01-1945   Reason for Encounter: General Disease State Call    Recent office visits:  07/02/20 Dr. Quay Burow ordered Prolia injection  Recent consult visits:  None ID  Hospital visits:  None in previous 6 months  Medications: Outpatient Encounter Medications as of 09/08/2020  Medication Sig  . CALCIUM PO Take 1 tablet by mouth daily.   . Cholecalciferol (VITAMIN D3) 1000 UNITS CAPS Take 1 capsule by mouth daily.  Marland Kitchen ELIQUIS 5 MG TABS tablet Take 5 mg by mouth 2 (two) times daily.  . fish oil-omega-3 fatty acids 1000 MG capsule Take 1 g by mouth daily.  . folic acid (FOLVITE) 1 MG tablet Take 1 mg by mouth daily.  . metoprolol succinate (TOPROL-XL) 50 MG 24 hr tablet TAKE 1 TABLET (50 MG TOTAL) BY MOUTH DAILY. TAKE WITH OR IMMEDIATELY FOLLOWING A MEAL.  . nitroGLYCERIN (NITROLINGUAL) 0.4 MG/SPRAY spray Place 1 spray under the tongue every 5 (five) minutes x 3 doses as needed.   Marland Kitchen omeprazole (PRILOSEC) 40 MG capsule Take 40 mg by mouth daily.  . rosuvastatin (CRESTOR) 20 MG tablet TAKE 1 TABLET BY MOUTH EVERY DAY  . Thiamine HCl (VITAMIN B-1) 250 MG tablet Take 500 mg by mouth daily.  . vitamin B-12 (CYANOCOBALAMIN) 1000 MCG tablet Take 1,000 mcg by mouth daily.   No facility-administered encounter medications on file as of 09/08/2020.    Have you had any problems recently with your health? Patient states that she does not have nay new health issues at this time  Have you had any problems with your pharmacy? Patient states that she does not have any problems with getting medications or the cost of medications from the pharmacy  What issues or side effects are you having with your medications? Patient states that she is not having any side effects from medications  What would you like me to pass along to [facility] for them to help you with?  Patient states that she does  not have any concerns about her health or medications at this time  What can we do to take care of you better? Patient states that if any changes in her health she will call Dr. Ronnald Ramp office  Star Rating Drugs: Rosuvastatin 08/21/20 90 d   [place] Pharmacist Assistant [phone]  Time spent:20

## 2020-09-10 ENCOUNTER — Other Ambulatory Visit: Payer: Self-pay

## 2020-09-10 ENCOUNTER — Ambulatory Visit (HOSPITAL_COMMUNITY): Payer: Medicare HMO | Attending: Internal Medicine

## 2020-09-10 DIAGNOSIS — I350 Nonrheumatic aortic (valve) stenosis: Secondary | ICD-10-CM | POA: Diagnosis not present

## 2020-09-10 LAB — ECHOCARDIOGRAM COMPLETE
AR max vel: 0.69 cm2
AV Area VTI: 0.82 cm2
AV Area mean vel: 0.7 cm2
AV Mean grad: 44 mmHg
AV Peak grad: 74.3 mmHg
Ao pk vel: 4.31 m/s
Area-P 1/2: 2.29 cm2
MV M vel: 6.48 m/s
MV Peak grad: 168 mmHg
MV VTI: 2.02 cm2
P 1/2 time: 475 msec
Radius: 0.8 cm
S' Lateral: 2.3 cm

## 2020-09-11 ENCOUNTER — Telehealth: Payer: Self-pay | Admitting: Cardiology

## 2020-09-11 NOTE — Telephone Encounter (Signed)
I reviewed the pt's echo results with her by phone.  I made her aware of referral to Dr Burt Knack with the [facility] team. I will cancel 5/25 pending appointment with Ermalinda Barrios and the pt will be scheduled to see Dr Burt Knack on 5/24 at 3:00 PM.  Pt verbalized understanding of plan.

## 2020-09-11 NOTE — Telephone Encounter (Signed)
Follow up:     Patient returning a call back from yesterday concerning some results.

## 2020-09-16 ENCOUNTER — Encounter: Payer: Self-pay | Admitting: Cardiovascular Disease

## 2020-09-16 ENCOUNTER — Ambulatory Visit: Payer: Medicare HMO | Admitting: Cardiovascular Disease

## 2020-09-16 ENCOUNTER — Other Ambulatory Visit: Payer: Self-pay

## 2020-09-16 VITALS — BP 130/72 | HR 69 | Ht 61.0 in | Wt 129.8 lb

## 2020-09-16 DIAGNOSIS — I350 Nonrheumatic aortic (valve) stenosis: Secondary | ICD-10-CM

## 2020-09-16 DIAGNOSIS — I340 Nonrheumatic mitral (valve) insufficiency: Secondary | ICD-10-CM

## 2020-09-16 DIAGNOSIS — I422 Other hypertrophic cardiomyopathy: Secondary | ICD-10-CM | POA: Diagnosis not present

## 2020-09-16 NOTE — Progress Notes (Signed)
[facility]   MULTIDISCIPLINARY HEART VALVE TEAM  Date:  09/17/2020   ID:  Carrie [name], DOB 1945-12-26, MRN [id number]  PCP:  [name], MD   Chief Complaint  Patient presents with  . Aortic Stenosis     HISTORY OF PRESENT ILLNESS: Carrie [name] is a 75 y.o. female who presents for evaluation of aortic stenosis, referred by Dr Meda Coffee.  The patient has been followed in our practice for many years.  She established with Dr. Algernon Huxley in 2012.  She had previously seen Dr. Stanford Breed.  She had a cardiac catheterization done in 2005 demonstrating moderate stenosis of the mid LAD.  She underwent an SVT ablation also in 2005.  She has mild aortic stenosis dating back a decade now with a mean gradient of 15 mmHg back in 2012.  Most recently she was followed by Dr. Meda Coffee for hypertrophic cardiomyopathy, paroxysmal atrial fibrillation, and aortic stenosis as outlined.  At the time of her last visit with Dr. Meda Coffee in January 2022 she was felt to be stable from a cardiac perspective.  There were plans for repeat echocardiogram because she had progressed to moderately severe aortic stenosis with a peak transvalvular velocity of 4.2 m/s and a mean gradient of 34 mmHg with calculated valve area 0.9 cm.  The patient's recent echocardiogram performed 09/10/2020 showed vigorous LV systolic function with LVEF 65 to 70%, severe asymmetric left ventricular hypertrophy of the septum, systolic anterior motion of the mitral valve with LVOT obstruction with a maximum instantaneous gradient of 26 mmHg, severe mitral regurgitation with severe mitral annular calcification and chordal calcification, and severe aortic stenosis with a mean gradient of 44 mmHg.  The patient is divorced. She has a son and a daughter, both of whom live locally. The patient has known of a heart murmur for many years. She is here with her friend today. She walks about one mile per day over a period of 15-30  minutes. She has no symptoms with that level of exertion. The patient denies any exertional symptoms even with activities such as carrying groceries or walking up hill. Today, she denies symptoms of palpitations, chest pain, shortness of breath, orthopnea, PND, lower extremity edema, dizziness, or syncope.  The patient has had regular dental care and reports every 6 month care, crowns in most of her teeth.  Past Medical History:  Diagnosis Date  . Aortic stenosis   . Coronary artery disease    a. Prior LHC (07/24/03): LMCA heavily calcified without significant stenosis, LAD with heavy calcification proximally. 50-60% stenosis just distal to D2, LCx calcified proximally without significant stenosis, Dominant RCA with moderate proximal calcification and mild luminal irregularities throughout, LVEDP 12, LVEF 70%  . Diastolic dysfunction without heart failure   . Former tobacco use   . Former tobacco use   . Heart murmur   . HOCM (hypertrophic obstructive cardiomyopathy) ([place])   . Mild AI (aortic insufficiency)   . Osteoporosis, unspecified   . Other and unspecified hyperlipidemia   . Paroxysmal atrial flutter ([place])   . Pneumonia   . Wears hearing aid    left    Current Outpatient Medications  Medication Sig Dispense Refill  . CALCIUM PO Take 1 tablet by mouth daily.     . Cholecalciferol (VITAMIN D3) 1000 UNITS CAPS Take 1 capsule by mouth daily.    Marland Kitchen ELIQUIS 5 MG TABS tablet Take 5 mg by mouth 2 (two) times daily.    . fish oil-omega-3  fatty acids 1000 MG capsule Take 1 g by mouth daily.    . metoprolol succinate (TOPROL-XL) 50 MG 24 hr tablet TAKE 1 TABLET (50 MG TOTAL) BY MOUTH DAILY. TAKE WITH OR IMMEDIATELY FOLLOWING A MEAL. 90 tablet 2  . nitroGLYCERIN (NITROLINGUAL) 0.4 MG/SPRAY spray Place 1 spray under the tongue every 5 (five) minutes x 3 doses as needed.     Marland Kitchen omeprazole (PRILOSEC) 40 MG capsule Take 40 mg by mouth daily.    . rosuvastatin (CRESTOR) 20 MG tablet TAKE 1 TABLET  BY MOUTH EVERY DAY 90 tablet 3  . Thiamine HCl (VITAMIN B-1) 250 MG tablet Take 500 mg by mouth daily.     No current facility-administered medications for this visit.    ALLERGIES:   Codeine, Crestor [rosuvastatin], and Latex   SOCIAL HISTORY:  The patient  reports that she quit smoking about 21 years ago. She has never used smokeless tobacco. She reports current alcohol use of about 7.0 standard drinks of alcohol per week. She reports that she does not use drugs.   FAMILY HISTORY:  The patient's family history includes Congestive Heart Failure in her mother; Dementia in her mother; Heart disease (age of onset: 8) in her mother; Hypertension in an other family member; Valvular heart disease in her father.   REVIEW OF SYSTEMS:  Positive for cough, congestion.   All other systems are reviewed and negative.   PHYSICAL EXAM: VS:  BP 130/72   Pulse 69   Ht 5\' 1"  (1.549 m)   Wt 129 lb 12.8 oz (58.9 kg)   SpO2 97%   BMI 24.53 kg/m  , BMI Body mass index is 24.53 kg/m. GEN: Well nourished, well developed, in no acute distress HEENT: normal Neck: No JVD. carotids 2+ with bilateral bruits Cardiac: The heart is RRR with a 3/6 crescendo decrescendo murmur at the RUSB, absent A2. No edema. Pedal pulses 2+ = bilaterally  Respiratory:  clear to auscultation bilaterally GI: soft, nontender, nondistended, + BS MS: no deformity or atrophy Skin: warm and dry, no rash Neuro:  Strength and sensation are intact Psych: euthymic mood, full affect  EKG:  EKG from 05/21/2020 reviewed and demonstrates normal sinus rhythm, PACs, nonspecific IVCD.  RECENT LABS: 05/21/2020: ALT 21; BUN 8; Creatinine, Ser 0.56; Hemoglobin 13.5; Platelets 305; Potassium 4.2; Sodium 140; TSH 1.150 09/16/2020: NT-Pro BNP 1,305  05/21/2020: Chol/HDL Ratio 2.6; Cholesterol, Total 199; HDL 78; LDL Chol Calc (NIH) 92; Triglycerides 175   CrCl cannot be calculated (Patient's most recent lab result is older than the maximum 21 days  allowed.).   Wt Readings from Last 3 Encounters:  09/16/20 129 lb 12.8 oz (58.9 kg)  09/01/20 128 lb 3.2 oz (58.2 kg)  05/21/20 127 lb 9.6 oz (57.9 kg)     CARDIAC STUDIES:  Echo:  Conclusion(s)/Recommendation(s): HCM with LVOT obstruction and SAM with  severe MR as well as severe aortic valve stenosis. Mechanism of mitral  valve regurgitation appears related to [facility], however valve is also quite  degenerative. Aortic valve spectral  Doppler signal is well distinguished from LVOT dagger shaped signal and  mitral regurgitant jet. RECOMMENDATIONS: Structual Heart Team evaluation  for further management of severe aortic valve stenosis in setting of HCM  with obstruction.   FINDINGS  Left Ventricle: Left ventricular ejection fraction, by estimation, is 65  to 70%. The left ventricle has normal function. The left ventricle has no  regional wall motion abnormalities. Global longitudinal strain performed  but not reported  based on  interpreter judgement due to suboptimal tracking. The left ventricular  internal cavity size was small. There is severe asymmetric left  ventricular hypertrophy of the septal segment. Left ventricular diastolic  parameters are indeterminate.   Right Ventricle: The right ventricular size is normal. No increase in  right ventricular wall thickness. Right ventricular systolic function is  normal. There is normal pulmonary artery systolic pressure. The tricuspid  regurgitant velocity is 2.51 m/s, and  with an assumed right atrial pressure of 3 mmHg, the estimated right  ventricular systolic pressure is 63.8 mmHg.   Left Atrium: Left atrial size was mildly dilated.   Right Atrium: Right atrial size was normal in size.   Pericardium: There is no evidence of pericardial effusion.   Mitral Valve: Systolic anterior motion of the mitral valve with mitral  valve regurgitation. The mitral valve is degenerative in appearance.  Moderate to severe mitral annular  calcification. Severe mitral valve  regurgitation. No evidence of mitral valve  stenosis. MV peak gradient, 6.5 mmHg. The mean mitral valve gradient is  2.0 mmHg with average heart rate of 59 bpm.   Tricuspid Valve: The tricuspid valve is normal in structure. Tricuspid  valve regurgitation is mild . No evidence of tricuspid stenosis.   Aortic Valve: The aortic valve is abnormal. There is severe calcifcation  of the aortic valve. Aortic valve regurgitation is moderate. Aortic  regurgitation PHT measures 475 msec. Severe aortic stenosis is present.  Aortic valve mean gradient measures 44.0  mmHg. Aortic valve peak gradient measures 74.3 mmHg. Aortic valve area, by  VTI measures 0.82 cm.   Pulmonic Valve: The pulmonic valve was grossly normal. Pulmonic valve  regurgitation is trivial.   Aorta: The aortic root is normal in size and structure.   IAS/Shunts: No atrial level shunt detected by color flow Doppler.     LEFT VENTRICLE  PLAX 2D  LVIDd:     3.30 cm Diastology  LVIDs:     2.30 cm LV e' medial:  3.05 cm/s  LV PW:     1.00 cm LV E/e' medial: 32.8  LV IVS:    1.40 cm LV e' lateral:  4.79 cm/s  LVOT diam:   1.60 cm LV E/e' lateral: 20.9  LV SV:     80  LV SV Index:  51  LVOT Area:   2.01 cm     RIGHT VENTRICLE  RV Basal diam: 3.00 cm  RV S prime:   12.50 cm/s  TAPSE (M-mode): 1.8 cm  RVSP:      28.2 mmHg   LEFT ATRIUM       Index    RIGHT ATRIUM      Index  LA diam:    3.70 cm 2.37 cm/m RA Pressure: 3.00 mmHg  LA Vol (A2C):  53.9 ml 34.47 ml/m RA Area:   6.54 cm  LA Vol (A4C):  54.0 ml 34.54 ml/m RA Volume:  9.71 ml  6.21 ml/m  LA Biplane Vol: 55.7 ml 35.63 ml/m  AORTIC VALVE  AV Area (Vmax):  0.69 cm  AV Area (Vmean):  0.70 cm  AV Area (VTI):   0.82 cm  AV Vmax:      431.00 cm/s  AV Vmean:     311.000 cm/s  AV VTI:      0.982 m  AV Peak Grad:   74.3 mmHg  AV  Mean Grad:   44.0 mmHg  LVOT Vmax:     147.00 cm/s  LVOT Vmean:  108.000 cm/s  LVOT VTI:     0.399 m  LVOT/AV VTI ratio: 0.41  AI PHT:      475 msec    AORTA  Ao Root diam: 2.90 cm  Ao Asc diam: 3.30 cm   MITRAL VALVE         TRICUSPID VALVE  MV Area (PHT): 2.29 cm   TR Peak grad:  25.2 mmHg  MV Area VTI:  2.02 cm   TR Vmax:    251.00 cm/s  MV Peak grad: 6.5 mmHg   Estimated RAP: 3.00 mmHg  MV Mean grad: 2.0 mmHg   RVSP:      28.2 mmHg  MV Vmax:    1.28 m/s  MV Vmean:   65.3 cm/s   SHUNTS  MV Decel Time: 332 msec   Systemic VTI: 0.40 m  MR Peak grad:  168.0 mmHg Systemic Diam: 1.60 cm  MR Mean grad:  95.0 mmHg  MR Vmax:     648.00 cm/s  MR Vmean:    441.0 cm/s  MR PISA:     4.02 cm  MR PISA Eff ROA: 19 mm  MR PISA Radius: 0.80 cm  MV E velocity: 100.00 cm/s  MV A velocity: 121.00 cm/s  MV E/A ratio: 0.83   ASSESSMENT AND PLAN: 6.  75 year old woman with severe, stage C aortic stenosis, hypertrophic cardiomyopathy with septal hypertrophy and systolic anterior motion of the mitral valve, and severe mitral regurgitation.  The patient maintains an active lifestyle and reports no exertional symptoms.  She has a difficult combination of problems with severe calcific aortic stenosis, moderate aortic insufficiency, subvalvular obstruction, and systolic anterior motion of mitral valve with severe mitral regurgitation.  Despite all of these problems, it is pretty remarkable that she remains completely asymptomatic.  I think we should check a stress echocardiogram to evaluate degree of subvalvular obstruction and also estimate her pulmonary pressures at rest and with exertion.  This will also give [place] definitive information about her exercise capacity and whether there are any high risk features seen on EKG or blood pressure response to exercise.  As long as she remains asymptomatic, it would be  reasonable to keep a very close eye on her with a repeat echocardiogram in 6 months.  She understands that she will likely require valvular intervention within a few years.  I am not sure that she will be a good candidate for transcatheter intervention such as TAVR because of her subvalvular obstruction, mitral valve systolic anterior motion, and mitral regurgitation that is likely not amenable to transcatheter edge-to-edge mitral valve repair.  Once her stress echocardiogram is completed, will review her case with our multidisciplinary heart valve team. She understands that cardiac surgery might be her best treatment option.  Druanne, Bosques 09/17/2020 5:37 AM     [facility] [address] [place] [address]  [phone] (office) [phone] (fax)

## 2020-09-16 NOTE — Patient Instructions (Signed)
Medication Instructions:  Your provider recommends that you continue on your current medications as directed. Please refer to the Current Medication list given to you today.   *If you need a refill on your cardiac medications before your next appointment, please call your pharmacy*  Lab Work: TODAY! BNP If you have labs (blood work) drawn today and your tests are completely normal, you will receive your results only by: Marland Kitchen MyChart Message (if you have MyChart) OR . A paper copy in the mail If you have any lab test that is abnormal or we need to change your treatment, we will call you to review the results.  Testing/Procedures: Your physician has requested that you have a stress echocardiogram. For further information please visit [email or web address]. Please follow instruction sheet as given.  Follow-Up: You will be called to arrange your 6 month echo and office visit with Dr. Burt Knack.

## 2020-09-17 ENCOUNTER — Ambulatory Visit: Payer: Medicare HMO | Admitting: Physician Assistant

## 2020-09-17 ENCOUNTER — Telehealth: Payer: Self-pay | Admitting: Cardiovascular Disease

## 2020-09-17 LAB — PRO B NATRIURETIC PEPTIDE: NT-Pro BNP: 1305 pg/mL — ABNORMAL HIGH (ref 0–738)

## 2020-09-17 MED ORDER — ELIQUIS 5 MG PO TABS: 5.0000 mg | ORAL_TABLET | Freq: Two times a day (BID) | ORAL | 1 refills | Status: DC

## 2020-09-17 NOTE — Telephone Encounter (Signed)
*  STAT* If patient is at the pharmacy, call can be transferred to refill team.   1. Which medications need to be refilled? (please list name of each medication and dose if known) ELIQUIS 5 MG TABS tablet  2. Which pharmacy/location (including street and city if local pharmacy) is medication to be sent to? [id number] - [place], [place]  3. Do they need a 30 day or 90 day supply? [place]

## 2020-09-17 NOTE — Telephone Encounter (Signed)
Prescription refill request for Eliquis received. Indication: a flutter Last office visit: [place], 09/16/2020 Scr: 0.56, 05/21/2020 Age: 75 yo  Weight: 68.9 kg  Pt is on the correct dose of Eliquis per dosing criteria, prescription refill sent for Eliquis 5mg  BID.

## 2020-09-19 ENCOUNTER — Telehealth: Payer: Self-pay | Admitting: Internal Medicine

## 2020-09-19 NOTE — Telephone Encounter (Signed)
Received a call for [facility] re: Prolia Confirming dosage, 60 mg injected under the skin every six months  Pharmacist is working on new auth for the November prolia injection

## 2020-09-23 ENCOUNTER — Telehealth: Payer: Self-pay | Admitting: Cardiovascular Disease

## 2020-09-23 MED ORDER — ELIQUIS 5 MG PO TABS: 5.0000 mg | ORAL_TABLET | Freq: Two times a day (BID) | ORAL | 1 refills | Status: DC

## 2020-09-23 NOTE — Telephone Encounter (Signed)
Pt's age 75, wt 58.9 kg, SCr 0.56, CrCl 80.71, last ov w/ [facility] 09/16/20.

## 2020-09-23 NOTE — Telephone Encounter (Signed)
*  STAT* If patient is at the pharmacy, call can be transferred to refill team.   1. Which medications need to be refilled? (please list name of each medication and dose if known) Eliquis   2. Which pharmacy/location (including street and city if local pharmacy) is medication to be sent to? [facility] [place]  3. Do they need a 30 day or 90 not sure

## 2020-09-29 ENCOUNTER — Ambulatory Visit (INDEPENDENT_AMBULATORY_CARE_PROVIDER_SITE_OTHER)
Admission: RE | Admit: 2020-09-29 | Discharge: 2020-09-29 | Disposition: A | Discharge status: Home / Self Care | Payer: Medicare HMO | Source: Ambulatory Visit | Attending: Internal Medicine | Admitting: Internal Medicine

## 2020-09-29 ENCOUNTER — Ambulatory Visit: Payer: Medicare HMO | Admitting: Internal Medicine

## 2020-09-29 ENCOUNTER — Other Ambulatory Visit: Payer: Self-pay

## 2020-09-29 DIAGNOSIS — M810 Age-related osteoporosis without current pathological fracture: Secondary | ICD-10-CM

## 2020-10-14 ENCOUNTER — Telehealth (HOSPITAL_COMMUNITY): Payer: Self-pay | Admitting: *Deleted

## 2020-10-14 NOTE — Telephone Encounter (Signed)
Patient given detailed instructions per Myocardial Perfusion Study Information Sheet for the test on 10/20/20 at 2:00. Patient notified to arrive 15 minutes early and that it is imperative to arrive on time for appointment to keep from having the test rescheduled.  If you need to cancel or reschedule your appointment, please call the office within 24 hours of your appointment. . Patient verbalized understanding.Carrie [name]

## 2020-10-20 ENCOUNTER — Other Ambulatory Visit (HOSPITAL_COMMUNITY): Payer: Medicare HMO

## 2020-10-20 ENCOUNTER — Ambulatory Visit (HOSPITAL_COMMUNITY)
Admission: RE | Admit: 2020-10-20 | Discharge: 2020-10-20 | Disposition: A | Discharge status: Home / Self Care | Payer: Medicare HMO | Source: Ambulatory Visit | Attending: Cardiovascular Disease | Admitting: Cardiovascular Disease

## 2020-10-20 ENCOUNTER — Other Ambulatory Visit: Payer: Self-pay

## 2020-10-20 DIAGNOSIS — R5383 Other fatigue: Secondary | ICD-10-CM | POA: Insufficient documentation

## 2020-10-20 DIAGNOSIS — I350 Nonrheumatic aortic (valve) stenosis: Secondary | ICD-10-CM

## 2020-10-20 DIAGNOSIS — I340 Nonrheumatic mitral (valve) insufficiency: Secondary | ICD-10-CM

## 2020-10-20 DIAGNOSIS — R079 Chest pain, unspecified: Secondary | ICD-10-CM

## 2020-10-20 LAB — ECHOCARDIOGRAM STRESS TEST
AV Mean grad: 47 mmHg
AV Peak grad: 73.6 mmHg
Ao pk vel: 4.29 m/s
P 1/2 time: 425 msec

## 2020-10-28 ENCOUNTER — Telehealth: Payer: Self-pay | Admitting: Pharmacist

## 2020-10-28 NOTE — Progress Notes (Signed)
Chronic Care Management Pharmacy Assistant   Name: Carrie [name]  MRN: [id number] DOB: 1945-09-21   Reason for Encounter: Disease State   Conditions to be addressed/monitored: HTN  Recent office visits:  None ID  Recent consult visits:  09/16/20 Dr. [name] Cardiology (Aortic Stenosis), no medication changes  Hospital visits:  None in previous 6 months  Medications: Outpatient Encounter Medications as of 10/28/2020  Medication Sig   CALCIUM PO Take 1 tablet by mouth daily.    Cholecalciferol (VITAMIN D3) 1000 UNITS CAPS Take 1 capsule by mouth daily.   ELIQUIS 5 MG TABS tablet Take 1 tablet (5 mg total) by mouth 2 (two) times daily.   fish oil-omega-3 fatty acids 1000 MG capsule Take 1 g by mouth daily.   metoprolol succinate (TOPROL-XL) 50 MG 24 hr tablet TAKE 1 TABLET (50 MG TOTAL) BY MOUTH DAILY. TAKE WITH OR IMMEDIATELY FOLLOWING A MEAL.   nitroGLYCERIN (NITROLINGUAL) 0.4 MG/SPRAY spray Place 1 spray under the tongue every 5 (five) minutes x 3 doses as needed.    omeprazole (PRILOSEC) 40 MG capsule Take 40 mg by mouth daily.   rosuvastatin (CRESTOR) 20 MG tablet TAKE 1 TABLET BY MOUTH EVERY DAY   Thiamine HCl (VITAMIN B-1) 250 MG tablet Take 500 mg by mouth daily.   No facility-administered encounter medications on file as of 10/28/2020.    Pharmacist Review  Reviewed chart prior to disease state call. Spoke with patient regarding BP  Recent Office Vitals: BP Readings from Last 3 Encounters:  09/16/20 130/72  09/01/20 138/68  05/21/20 130/72   Pulse Readings from Last 3 Encounters:  09/16/20 69  09/01/20 80  05/21/20 69    Wt Readings from Last 3 Encounters:  09/16/20 129 lb 12.8 oz (58.9 kg)  09/01/20 128 lb 3.2 oz (58.2 kg)  05/21/20 127 lb 9.6 oz (57.9 kg)     Kidney Function Lab Results  Component Value Date/Time   CREATININE 0.56 (L) 05/21/2020 10:24 AM   CREATININE 0.67 03/19/2019 10:51 AM   CREATININE 0.75 03/16/2016 10:52 AM    GFR 98.54 01/03/2018 11:20 AM   GFRNONAA 92 05/21/2020 10:24 AM   GFRAA 105 05/21/2020 10:24 AM    BMP Latest Ref Rng & Units 05/21/2020 03/19/2019 01/03/2018  Glucose 65 - 99 mg/dL 89 92 86  BUN 8 - 27 mg/dL 8 6(L) 14  Creatinine 0.57 - 1.00 mg/dL 0.56(L) 0.67 0.63  BUN/Creat Ratio 12 - 28 14 9(L) -  Sodium 134 - 144 mmol/L 140 141 139  Potassium 3.5 - 5.2 mmol/L 4.2 4.0 4.3  Chloride 96 - 106 mmol/L 103 102 103  CO2 20 - 29 mmol/L 24 24 28   Calcium 8.7 - 10.3 mg/dL 9.0 9.0 9.8    Current antihypertensive regimen:  Metoprolol succinate 50 mg 1 tab daily  How often are you checking your Blood Pressure? daily  Current home BP readings: Patient states that her blood pressure this afternoon was 133/74  What recent interventions/DTPs have been made by any provider to improve Blood Pressure control since last CPP Visit: None ID  Any recent hospitalizations or ED visits since last visit with CPP? No  What diet changes have been made to improve Blood Pressure Control?  Patient states that she has not made any changes to diet, she does not eat much and maintains her weight What exercise is being done to improve your Blood Pressure Control?  Patient states that she does walk about a mile everyday  Adherence Review: Is the patient currently on ACE/ARB medication? No Does the patient have >5 day gap between last estimated fill dates? No   Star Rating Drugs: Rosuvastatin 08/21/20 90 ds  [place] Pharmacist Assistant [phone]   Time spent:31

## 2020-11-04 ENCOUNTER — Other Ambulatory Visit: Payer: Self-pay

## 2020-11-04 ENCOUNTER — Ambulatory Visit (INDEPENDENT_AMBULATORY_CARE_PROVIDER_SITE_OTHER): Payer: Medicare HMO | Admitting: Pharmacist

## 2020-11-04 DIAGNOSIS — I10 Essential (primary) hypertension: Secondary | ICD-10-CM | POA: Diagnosis not present

## 2020-11-04 DIAGNOSIS — I480 Paroxysmal atrial fibrillation: Secondary | ICD-10-CM | POA: Diagnosis not present

## 2020-11-04 DIAGNOSIS — E785 Hyperlipidemia, unspecified: Secondary | ICD-10-CM | POA: Diagnosis not present

## 2020-11-04 DIAGNOSIS — I2583 Coronary atherosclerosis due to lipid rich plaque: Secondary | ICD-10-CM | POA: Diagnosis not present

## 2020-11-04 DIAGNOSIS — I2510 Atherosclerotic heart disease of native coronary artery without angina pectoris: Secondary | ICD-10-CM | POA: Diagnosis not present

## 2020-11-04 DIAGNOSIS — M810 Age-related osteoporosis without current pathological fracture: Secondary | ICD-10-CM

## 2020-11-04 NOTE — Patient Instructions (Signed)
Visit Information  Phone number for Pharmacist: [phone]   Goals Addressed             This Visit's Progress    Manage My Medicine       Timeframe:  Long-Range Goal Priority:  Medium Start Date:    11/04/20                         Expected End Date:    11/04/21                   Follow Up Date Jan 2023   - call for medicine refill 2 or 3 days before it runs out - call if I am sick and can't take my medicine - keep a list of all the medicines I take; vitamins and herbals too  - Get Shingrix vaccine and Covid booster at local pharmacy (not available in office)   Why is this important?   These steps will help you keep on track with your medicines.   Notes:          Patient verbalizes understanding of instructions provided today and agrees to view in [place].  Telephone follow up appointment with pharmacy team member scheduled for: 1 year  Charlene Brooke, PharmD, [place], CPP Clinical Pharmacist [facility] [phone]

## 2020-11-04 NOTE — Progress Notes (Signed)
Chronic Care Management Pharmacy Note  11/04/2020 Name:  Carrie [name] MRN:  [id number] DOB:  12-Nov-1945  Summary: -Pt endorses compliance with medications, denies issues -LDL is above goal < 70 (92 in Jan 2022) -Pt had 1 COVID booster in late summer 2021, has not received 2nd booster  Recommendations/Changes made from today's visit: -Advised to get Covid booster, Shingrix vaccines at [facility] pharmacy -Advised to work on lifestyle modifications for lowering cholesterol   Subjective: Carrie [name] is an 75 y.o. year old female who is a primary patient of [name], MD.  The CCM team was consulted for assistance with disease management and care coordination needs.    Engaged with patient by telephone for follow up visit in response to provider referral for pharmacy case management and/or care coordination services.   Consent to Services:  The patient was given information about Chronic Care Management services, agreed to services, and gave verbal consent prior to initiation of services.  Please see initial visit note for detailed documentation.   Patient Care Team: [name], MD as PCP - General (Internal Medicine) Dorothy Spark, MD (Inactive) as PCP - Cardiology (Cardiology) Charlton Haws, [facility] as Pharmacist (Pharmacist)  Recent office visits: 09/01/20 Dr Quay Burow OV: acute visit for swallowing issues; hx pharyngeal dysphagia, counseled on eating/drinking techniques to limit sx. Ordered DEXA scan.  Recent consult visits: 10/20/20 ECHO stress test - no cardiopulmonary sx, good exercise capacity  09/16/20 Dr Burt Knack (cardiology): f/u aortic stenosis, pt remains asymptomatic. Ordered stress ECHO.  Hospital visits: None in previous 6 months   Objective:  Lab Results  Component Value Date   CREATININE 0.56 (L) 05/21/2020   BUN 8 05/21/2020   GFR 98.54 01/03/2018   GFRNONAA 92 05/21/2020   GFRAA 105 05/21/2020   NA 140 05/21/2020   K 4.2  05/21/2020   CALCIUM 9.0 05/21/2020   CO2 24 05/21/2020   GLUCOSE 89 05/21/2020    Lab Results  Component Value Date/Time   HGBA1C 5.6 03/19/2019 10:51 AM   GFR 98.54 01/03/2018 11:20 AM   GFR 118.38 01/20/2016 11:12 AM    Last diabetic Eye exam: No results found for: HMDIABEYEEXA  Last diabetic Foot exam: No results found for: HMDIABFOOTEX   Lab Results  Component Value Date   CHOL 199 05/21/2020   HDL 78 05/21/2020   LDLCALC 92 05/21/2020   LDLDIRECT 133.8 12/19/2012   TRIG 175 (H) 05/21/2020   CHOLHDL 2.6 05/21/2020    Hepatic Function Latest Ref Rng & Units 05/21/2020 03/19/2019 08/24/2018  Total Protein 6.0 - 8.5 g/dL 7.1 7.1 -  Albumin 3.7 - 4.7 g/dL 4.3 3.6(L) -  AST 0 - 40 IU/L 27 22 -  ALT 0 - 32 IU/L 21 14 18   Alk Phosphatase 44 - 121 IU/L 81 85 -  Total Bilirubin 0.0 - 1.2 mg/dL 0.5 0.5 -  Bilirubin, Direct 0.00 - 0.40 mg/dL - - -    Lab Results  Component Value Date/Time   TSH 1.150 05/21/2020 10:24 AM   TSH 0.707 03/19/2019 10:51 AM    CBC Latest Ref Rng & Units 05/21/2020 03/19/2019 01/03/2018  WBC 3.4 - 10.8 x10E3/uL 7.1 8.2 7.9  Hemoglobin 11.1 - 15.9 g/dL 13.5 12.5 14.6  Hematocrit 34.0 - 46.6 % 39.3 36.8 42.6  Platelets 150 - 450 x10E3/uL 305 515(H) 344.0    Lab Results  Component Value Date/Time   VD25OH 54 01/30/2010 10:49 PM    Clinical ASCVD: Yes  The  10-year ASCVD risk score Mikey Bussing DC Brooke Bonito., et al., 2013) is: 21.8%   Values used to calculate the score:     Age: 36 years     Sex: Female     Is Non-Hispanic African American: No     Diabetic: No     Tobacco smoker: No     Systolic Blood Pressure: 161 mmHg     Is BP treated: Yes     HDL Cholesterol: 78 mg/dL     Total Cholesterol: 199 mg/dL    Depression screen [facility] 2/9 09/01/2020 08/30/2019 05/08/2018  Decreased Interest 0 0 0  Down, Depressed, Hopeless 0 0 1  PHQ - 2 Score 0 0 1  Altered sleeping - - -  Tired, decreased energy - - -  Change in appetite - - -  Feeling bad or failure  about yourself  - - -  Trouble concentrating - - -  Moving slowly or fidgety/restless - - -  Suicidal thoughts - - -  PHQ-9 Score - - -  Difficult doing work/chores - - -  Some recent data might be hidden     Social History   Tobacco Use  Smoking Status Former   Pack years: 0.00   Types: Cigarettes   Quit date: 04/05/1999   Years since quitting: 21.6  Smokeless Tobacco Never  Tobacco Comments   Regular Exercise-No   BP Readings from Last 3 Encounters:  09/16/20 130/72  09/01/20 138/68  05/21/20 130/72   Pulse Readings from Last 3 Encounters:  09/16/20 69  09/01/20 80  05/21/20 69   Wt Readings from Last 3 Encounters:  09/16/20 129 lb 12.8 oz (58.9 kg)  09/01/20 128 lb 3.2 oz (58.2 kg)  05/21/20 127 lb 9.6 oz (57.9 kg)   BMI Readings from Last 3 Encounters:  09/16/20 24.53 kg/m  09/01/20 24.22 kg/m  05/21/20 24.11 kg/m    Assessment/Interventions: Review of patient past medical history, allergies, medications, health status, including review of consultants reports, laboratory and other test data, was performed as part of comprehensive evaluation and provision of chronic care management services.   SDOH:  (Social Determinants of Health) assessments and interventions performed: Yes  SDOH Screenings   Alcohol Screen: Not on file  Depression (PHQ2-9): Low Risk    PHQ-2 Score: 0  Financial Resource Strain: Medium Risk   Difficulty of Paying Living Expenses: Somewhat hard  Food Insecurity: Not on file  Housing: Not on file  Physical Activity: Not on file  Social Connections: Not on file  Stress: Not on file  Tobacco Use: Medium Risk   Smoking Tobacco Use: Former   Smokeless Tobacco Use: Never  Transportation Needs: Not on file    [place]  Allergies  Allergen Reactions   Codeine Nausea And Vomiting   Crestor [Rosuvastatin] Other (See Comments)    Crestor 71m fatigue and exhaustion--pt tolerates crestor 238m  Latex Rash    Medications  Reviewed Today     Reviewed by FoCharlton HawsRPH (Pharmacist) on 11/04/20 at 1525  Med List Status: <None>   Medication Order Taking? Sig Documenting Provider Last Dose Status Informant  CALCIUM PO [id number] Take 1 tablet by mouth daily.  [provider] Taking Active Self  Cholecalciferol (VITAMIN D3) 1000 UNITS CAPS [id number] Take 1 capsule by mouth daily. [provider] Taking Active Self  ELIQUIS 5 MG TABS tablet [id number] Take 1 tablet (5 mg total) by mouth 2 (two) times daily. CoSherren MochaMD  Taking Active   fish oil-omega-3 fatty acids 1000 MG capsule [id number] Yes Take 1 g by mouth daily. [provider] Taking Active Self  metoprolol succinate (TOPROL-XL) 50 MG 24 hr tablet [id number] Yes TAKE 1 TABLET (50 MG TOTAL) BY MOUTH DAILY. TAKE WITH OR IMMEDIATELY FOLLOWING A MEAL. Dorothy Spark, MD Taking Active   nitroGLYCERIN (NITROLINGUAL) 0.4 MG/SPRAY spray [id number] Yes Place 1 spray under the tongue every 5 (five) minutes x 3 doses as needed.  [provider] Taking Active            Med Note Lia Hopping, MINDY L   Thu Mar 29, 2019 10:46 AM)    omeprazole (PRILOSEC) 40 MG capsule [id number] Yes Take 40 mg by mouth daily. [name], MD Taking Active   rosuvastatin (CRESTOR) 20 MG tablet [id number] Yes TAKE 1 TABLET BY MOUTH EVERY DAY Dorothy Spark, MD Taking Active   Thiamine HCl (VITAMIN B-1) 250 MG tablet [id number] Yes Take 500 mg by mouth daily. [provider] Taking Active Self            Patient Active Problem List   Diagnosis Date Noted   Contact dermatitis 08/30/2019   GERD without esophagitis 01/05/2019   Pharyngeal dysphagia 01/04/2019   Urinary frequency 01/04/2019   Visit for screening mammogram 01/04/2019   Essential hypertension 02/13/2018   Paroxysmal atrial fibrillation ([place]) 08/23/2017   Coronary artery disease involving native coronary artery of native heart without angina pectoris  02/24/2017   Pure hypercholesterolemia 02/24/2017   Hypertrophic cardiomyopathy ([place]) 11/22/2016   Paroxysmal atrial flutter ([place]) 07/08/2016   Varicose veins of lower extremities with other complications 73/53/2992   Allergic rhinitis 12/28/2012   OAB (overactive bladder) 06/22/2012   Routine general medical examination at a health care facility 06/22/2012   Moderate aortic stenosis 09/05/2010   Coronary artery disease due to lipid rich plaque 06/26/2009   SUPRAVENTRICULAR TACHYCARDIA 06/23/2009   Hyperlipidemia 03/06/2009   Osteoporosis 03/06/2009    Immunization History  Administered Date(s) Administered   Fluad Quad(high Dose 65+) 01/04/2019   Influenza Whole 01/06/2010, 01/19/2012   Influenza, High Dose Seasonal PF 01/23/2015, 01/20/2016, 01/19/2017, 01/03/2018   Influenza,inj,Quad PF,6+ Mos 02/08/2014   Moderna Sars-Covid-2 Vaccination 05/29/2019, 06/26/2019   Pneumococcal Conjugate-13 04/22/2015   Pneumococcal Polysaccharide-23 06/22/2012   Td 12/25/2009   Zoster, Live 12/25/2009    Conditions to be addressed/monitored:  Hypertension, Hyperlipidemia, Atrial Fibrillation, Coronary Artery Disease, and Osteoporosis  Care Plan : [place]  Updates made by Charlton Haws, [place] since 11/04/2020 12:00 AM     Problem: Hypertension, Hyperlipidemia, Atrial Fibrillation, Coronary Artery Disease, and Osteoporosis   Priority: High     Long-Range Goal: Disease management   Start Date: 11/04/2020  Expected End Date: 11/04/2021  This Visit's Progress: On track  Priority: High  Note:   Current Barriers:  Unable to independently monitor therapeutic efficacy  Pharmacist Clinical Goal(s):  Patient will achieve adherence to monitoring guidelines and medication adherence to achieve therapeutic efficacy through collaboration with PharmD and provider.   Interventions: 1:1 collaboration with [name], MD regarding development and update of comprehensive plan of  care as evidenced by provider attestation and co-signature Inter-disciplinary care team collaboration (see longitudinal plan of care) Comprehensive medication review performed; medication list updated in electronic medical record  AFIB / Hypertension    Patient is currently rate controlled. Hx severe aortic stenosis. Considering TAVR w/ Dr Burt Knack.  BP goal < 130/80 Patrent checks BP at  home infrequently Patient home BP readings are ranging: "good"   Patient has failed these meds in past: n/a Patient is currently controlled on the following medications: Metoprolol succinate 25 mg daily Eliquis 5 mg BID   We discussed:  Pt reports compliance with medications; she denies bleeidng/bruising issues; she is not yet in donut hole and Eliquis is affordable; advised she will likely qualify for PAP again once she reaches donut hole around Sept 2022; she should reach out to cardiology office when this occurs as they have already submitted the application once earlier this year   Plan Continue current medications  Pursue Eliquis PAP via cardiology office once donut hole reached   Hyperlipidemia / CAD    LDL goal < 70 Hx nonobstructive CAD, cath without stents 2005. No aspirin due to oral anticoagulation w/ Eliquis  Patient has failed these meds in past: n/a Patient is currently controlled on the following medications: Rosuvastatin 20 mg daily HS Nitroglycerin 0.4 mg spray PRN Fish oil OTC 1 g daily   We discussed: LDL is not quite at goal most recently and higher than previous (65 > 92); pt has been on same dose of rosuvastatin for years; previously advised by cardiologist to reduce cholesterol in diet and increase exercise to reduce TRIG as well   Plan: Continue current medications and control with diet and exercise   Osteoporosis    Last DEXA Scan: 01/2017 -> 09/29/20             T-Score femoral neck: -3.1 > -2.7 (improved)             T-Score lumbar spine: -2.5 > -2.8  (worsened)    Patient is a candidate for pharmacologic treatment due to T-Score < -2.5 in femoral neck and T-Score < -2.5 in lumbar spine   Patient has failed these meds in past: n/a Patient is currently controlled on the following medications: Prolia q6 months @ PCP office (last injection 09/01/2020) Calcium Vitamin D 1000 IU daily   We discussed:  Pt had repeat DEXA scan in June as above and PCP advised to continue Prolia since osteoporosis is still present; pt has appt scheduled for next injection in Nov 2022; emphasized importance of weight-bearing exercise to improve bone density as well   Plan: Continue current medications  Health maintenance  -Vaccine gaps: Shingrix, covid booster, TDAP -pt reports she got one Covid booster in late summer 2021, but never got the 2nd booster; she denies ever getting Shingrix vaccine -Recommended to get Covid booster and Shingrix vaccines at local pharmacy (neither available in office)  Patient Goals/Self-Care Activities Patient will:  - take medications as prescribed focus on medication adherence by routine check blood pressure weekly, document, and provide at future appointments collaborate with provider on medication access solutions target a minimum of 150 minutes of moderate intensity exercise weekly -Get Covid booster and Shingrix vaccine at local pharmacy      Medication Assistance:  Eliquis - qualified for BMS Sept 2021 - Dec 2021; as of 05/2020 she did not yet qualify due to 3% OOP cost requirement  Compliance/Adherence/Medication fill history: Care Gaps: Shingrix Covid booster (due 11/26/19) TDAP (due 12/26/19) Colonoscopy (due 04/15/20)  Star-Rating Drugs: Rosuvastatin  - LF 08/21/20 x 90 ds  Patient's preferred pharmacy is:  [id number] [place] [id number]: [phone]: [phone] Uses pill box? Yes Pt endorses 100% compliance  We  discussed:  Current pharmacy is preferred with insurance plan and patient is satisfied with pharmacy services Patient decided to: Continue current medication management strategy  Care Plan and Follow Up Patient Decision:  Patient agrees to Care Plan and Follow-up.  Plan: Telephone follow up appointment with care management team member scheduled for:  1 year  Charlene Brooke, PharmD, [place], CPP Clinical Pharmacist [facility] [phone]

## 2020-11-06 ENCOUNTER — Ambulatory Visit (INDEPENDENT_AMBULATORY_CARE_PROVIDER_SITE_OTHER): Payer: Medicare HMO

## 2020-11-06 ENCOUNTER — Other Ambulatory Visit: Payer: Self-pay

## 2020-11-06 VITALS — BP 120/70 | HR 84 | Temp 98.1°F | Ht 61.0 in | Wt 127.0 lb

## 2020-11-06 DIAGNOSIS — Z0000 Encounter for general adult medical examination without abnormal findings: Secondary | ICD-10-CM | POA: Diagnosis not present

## 2020-11-06 NOTE — Patient Instructions (Addendum)
Carrie [name] , Thank you for taking time to come for your Medicare Wellness Visit. I appreciate your ongoing commitment to your health goals. Please review the following plan we discussed and let me know if I can assist you in the future.   Screening recommendations/referrals: Colonoscopy: last done 04/15/2017; due every 3 years Mammogram: last done 10/01/2019; due every year Bone Density: last done 09/29/2020; due every 2 years Recommended yearly ophthalmology/optometry visit for glaucoma screening and checkup Recommended yearly dental visit for hygiene and checkup  Vaccinations: Influenza vaccine: due 11/24/2020 Pneumococcal vaccine: 06/22/2012, 04/22/2015 Tdap vaccine: 12/25/2009; due every 10 years (overdue); TDAP: not covered by Medicare as preventative but will cover as treatment for an injury. Shingles vaccine: Please call your insurance company to determine your out of pocket expense for the Shingrix vaccine. You may receive this vaccine at your local pharmacy.   Covid-19: 05/29/2019, 06/26/2019, 02/20/2020  Advanced directives: Please bring a copy of your health care power of attorney and living will to the office at your convenience.  Conditions/risks identified: Client understands the importance of follow-up with providers by attending scheduled visits and discussed goals to eat healthier, increase physical activity, exercise the brain, socialize more,  get enough rest and make time for laughter.  Next appointment: Please schedule your next Medicare Wellness Visit with your Nurse Health Advisor in 1 year by calling [phone].   Preventive Care 28 Years and Older, Female Preventive care refers to lifestyle choices and visits with your health care provider that can promote health and wellness. What does preventive care include? A yearly physical exam. This is also called an annual well check. Dental exams once or twice a year. Routine eye exams. Ask your health care provider how often you  should have your eyes checked. Personal lifestyle choices, including: Daily care of your teeth and gums. Regular physical activity. Eating a healthy diet. Avoiding tobacco and drug use. Limiting alcohol use. Practicing safe sex. Taking low-dose aspirin every day. Taking vitamin and mineral supplements as recommended by your health care provider. What happens during an annual well check? The services and screenings done by your health care provider during your annual well check will depend on your age, overall health, lifestyle risk factors, and family history of disease. Counseling  Your health care provider may ask you questions about your: Alcohol use. Tobacco use. Drug use. Emotional well-being. Home and relationship well-being. Sexual activity. Eating habits. History of falls. Memory and ability to understand (cognition). Work and work Statistician. Reproductive health. Screening  You may have the following tests or measurements: Height, weight, and BMI. Blood pressure. Lipid and cholesterol levels. These may be checked every 5 years, or more frequently if you are over 68 years old. Skin check. Lung cancer screening. You may have this screening every year starting at age 92 if you have a 30-pack-year history of smoking and currently smoke or have quit within the past 15 years. Fecal occult blood test (FOBT) of the stool. You may have this test every year starting at age 29. Flexible sigmoidoscopy or colonoscopy. You may have a sigmoidoscopy every 5 years or a colonoscopy every 10 years starting at age 58. Hepatitis C blood test. Hepatitis B blood test. Sexually transmitted disease (STD) testing. Diabetes screening. This is done by checking your blood sugar (glucose) after you have not eaten for a while (fasting). You may have this done every 1-3 years. Bone density scan. This is done to screen for osteoporosis. You may have this done  starting at age 23. Mammogram. This may  be done every 1-2 years. Talk to your health care provider about how often you should have regular mammograms. Talk with your health care provider about your test results, treatment options, and if necessary, the need for more tests. Vaccines  Your health care provider may recommend certain vaccines, such as: Influenza vaccine. This is recommended every year. Tetanus, diphtheria, and acellular pertussis (Tdap, Td) vaccine. You may need a Td booster every 10 years. Zoster vaccine. You may need this after age 78. Pneumococcal 13-valent conjugate (PCV13) vaccine. One dose is recommended after age 60. Pneumococcal polysaccharide (PPSV23) vaccine. One dose is recommended after age 39. Talk to your health care provider about which screenings and vaccines you need and how often you need them. This information is not intended to replace advice given to you by your health care provider. Make sure you discuss any questions you have with your health care provider. Document Released: 05/09/2015 Document Revised: 12/31/2015 Document Reviewed: 02/11/2015 Elsevier Interactive Patient Education  2017 [place] Prevention in the Home Falls can cause injuries. They can happen to people of all ages. There are many things you can do to make your home safe and to help prevent falls. What can I do on the outside of my home? Regularly fix the edges of walkways and driveways and fix any cracks. Remove anything that might make you trip as you walk through a door, such as a raised step or threshold. Trim any bushes or trees on the path to your home. Use bright outdoor lighting. Clear any walking paths of anything that might make someone trip, such as rocks or tools. Regularly check to see if handrails are loose or broken. Make sure that both sides of any steps have handrails. Any raised decks and porches should have guardrails on the edges. Have any leaves, snow, or ice cleared regularly. Use sand or salt  on walking paths during winter. Clean up any spills in your garage right away. This includes oil or grease spills. What can I do in the bathroom? Use night lights. Install grab bars by the toilet and in the tub and shower. Do not use towel bars as grab bars. Use non-skid mats or decals in the tub or shower. If you need to sit down in the shower, use a plastic, non-slip stool. Keep the floor dry. Clean up any water that spills on the floor as soon as it happens. Remove soap buildup in the tub or shower regularly. Attach bath mats securely with double-sided non-slip rug tape. Do not have throw rugs and other things on the floor that can make you trip. What can I do in the bedroom? Use night lights. Make sure that you have a light by your bed that is easy to reach. Do not use any sheets or blankets that are too big for your bed. They should not hang down onto the floor. Have a firm chair that has side arms. You can use this for support while you get dressed. Do not have throw rugs and other things on the floor that can make you trip. What can I do in the kitchen? Clean up any spills right away. Avoid walking on wet floors. Keep items that you use a lot in easy-to-reach places. If you need to reach something above you, use a strong step stool that has a grab bar. Keep electrical cords out of the way. Do not use floor polish or wax that  makes floors slippery. If you must use wax, use non-skid floor wax. Do not have throw rugs and other things on the floor that can make you trip. What can I do with my stairs? Do not leave any items on the stairs. Make sure that there are handrails on both sides of the stairs and use them. Fix handrails that are broken or loose. Make sure that handrails are as long as the stairways. Check any carpeting to make sure that it is firmly attached to the stairs. Fix any carpet that is loose or worn. Avoid having throw rugs at the top or bottom of the stairs. If you  do have throw rugs, attach them to the floor with carpet tape. Make sure that you have a light switch at the top of the stairs and the bottom of the stairs. If you do not have them, ask someone to add them for you. What else can I do to help prevent falls? Wear shoes that: Do not have high heels. Have rubber bottoms. Are comfortable and fit you well. Are closed at the toe. Do not wear sandals. If you use a stepladder: Make sure that it is fully opened. Do not climb a closed stepladder. Make sure that both sides of the stepladder are locked into place. Ask someone to hold it for you, if possible. Clearly mark and make sure that you can see: Any grab bars or handrails. First and last steps. Where the edge of each step is. Use tools that help you move around (mobility aids) if they are needed. These include: Canes. Walkers. Scooters. Crutches. Turn on the lights when you go into a dark area. Replace any light bulbs as soon as they burn out. Set up your furniture so you have a clear path. Avoid moving your furniture around. If any of your floors are uneven, fix them. If there are any pets around you, be aware of where they are. Review your medicines with your doctor. Some medicines can make you feel dizzy. This can increase your chance of falling. Ask your doctor what other things that you can do to help prevent falls. This information is not intended to replace advice given to you by your health care provider. Make sure you discuss any questions you have with your health care provider. Document Released: 02/06/2009 Document Revised: 09/18/2015 Document Reviewed: 05/17/2014 Elsevier Interactive Patient Education  2017 [employer].

## 2020-11-06 NOTE — Progress Notes (Signed)
Subjective:   Carrie [name] is a 75 y.o. female who presents for Medicare Annual (Subsequent) preventive examination.  Review of Systems     Cardiac Risk Factors include: advanced age (>28men, >66 women);dyslipidemia;family history of premature cardiovascular disease;hypertension     Objective:    Today's Vitals   11/06/20 1223 11/06/20 1232  BP:  120/70  Pulse:  84  Temp:  98.1 F (36.7 C)  SpO2:  97%  Weight:  127 lb (57.6 kg)  Height:  5\' 1"  (1.549 m)  PainSc: 0-No pain 0-No pain   Body mass index is 24 kg/m.  Advanced Directives 11/06/2020 04/27/2017 04/15/2017 01/19/2017 11/11/2016 11/10/2016 04/22/2015  Does Patient Have a Medical Advance Directive? Yes No No No No No Yes  Type of Advance Directive Living will;Healthcare Power of Attorney - - - - - -  Does patient want to make changes to medical advance directive? No - Patient declined - - - - - -  Copy of [place] in Chart? No - copy requested - - - - - -  Would patient like information on creating a medical advance directive? - - - Yes (ED - Information included in AVS) No - Patient declined - -    Current Medications (verified) Outpatient Encounter Medications as of 11/06/2020  Medication Sig   CALCIUM PO Take 1 tablet by mouth daily.    Cholecalciferol (VITAMIN D3) 1000 UNITS CAPS Take 1 capsule by mouth daily.   ELIQUIS 5 MG TABS tablet Take 1 tablet (5 mg total) by mouth 2 (two) times daily.   metoprolol succinate (TOPROL-XL) 50 MG 24 hr tablet TAKE 1 TABLET (50 MG TOTAL) BY MOUTH DAILY. TAKE WITH OR IMMEDIATELY FOLLOWING A MEAL.   nitroGLYCERIN (NITROLINGUAL) 0.4 MG/SPRAY spray Place 1 spray under the tongue every 5 (five) minutes x 3 doses as needed.    omeprazole (PRILOSEC) 40 MG capsule Take 40 mg by mouth daily.   rosuvastatin (CRESTOR) 20 MG tablet TAKE 1 TABLET BY MOUTH EVERY DAY   Thiamine HCl (VITAMIN B-1) 250 MG tablet Take 500 mg by mouth daily.   fish oil-omega-3 fatty  acids 1000 MG capsule Take 1 g by mouth daily. (Patient not taking: Reported on 11/06/2020)   No facility-administered encounter medications on file as of 11/06/2020.    Allergies (verified) Codeine, Crestor [rosuvastatin], and Latex   History: Past Medical History:  Diagnosis Date   Aortic stenosis    Coronary artery disease    a. Prior LHC (07/24/03): LMCA heavily calcified without significant stenosis, LAD with heavy calcification proximally. 50-60% stenosis just distal to D2, LCx calcified proximally without significant stenosis, Dominant RCA with moderate proximal calcification and mild luminal irregularities throughout, LVEDP 12, LVEF 38%   Diastolic dysfunction without heart failure    Former tobacco use    Former tobacco use    Heart murmur    HOCM (hypertrophic obstructive cardiomyopathy) (HCC)    Mild AI (aortic insufficiency)    Osteoporosis, unspecified    Other and unspecified hyperlipidemia    Paroxysmal atrial flutter ([place])    Pneumonia    Wears hearing aid    left   Past Surgical History:  Procedure Laterality Date   ATRIAL ABLATION SURGERY  2005   BREAST SURGERY  1982   Benign tumor removed RT   BUNIONECTOMY  04/07/2012   Procedure: BUNIONECTOMY;  Surgeon: Alta Corning, MD;  Location: [place];  Service: Orthopedics;  Laterality: Left;  CHEVRON OSTEOTOMY  LEFT FOOT    CARDIAC CATHETERIZATION  2005   GANGLION CYST EXCISION     left wrist   HERNIA REPAIR  1990   rt ing   TONSILLECTOMY     TUBAL LIGATION     Family History  Problem Relation Age of Onset   Heart disease Mother 40   Congestive Heart Failure Mother    Dementia Mother    Hypertension Other    Valvular heart disease Father        Had valves replaced   Colon cancer Neg Hx    Rectal cancer Neg Hx    Social History   Socioeconomic History   Marital status: Divorced    Spouse name: Not on file   Number of children: 2   Years of education: Not on file   Highest education  level: Not on file  Occupational History   Occupation: retired  Tobacco Use   Smoking status: Former    Types: Cigarettes    Quit date: 04/05/1999    Years since quitting: 21.6   Smokeless tobacco: Never   Tobacco comments:    Regular Exercise-No  Vaping Use   Vaping Use: Never used  Substance and Sexual Activity   Alcohol use: Yes    Alcohol/week: 7.0 standard drinks    Types: 7 Glasses of wine per week    Comment: 1 drink per week   Drug use: No   Sexual activity: Not on file  Other Topics Concern   Not on file  Social History Narrative   Not on file   Social Determinants of Health   Financial Resource Strain: Medium Risk   Difficulty of Paying Living Expenses: Somewhat hard  Food Insecurity: No Food Insecurity   Worried About Running Out of Food in the Last Year: Never true   Ran Out of Food in the Last Year: Never true  Transportation Needs: No Transportation Needs   Lack of Transportation (Medical): No   Lack of Transportation (Non-Medical): No  Physical Activity: Sufficiently Active   Days of Exercise per Week: 7 days   Minutes of Exercise per Session: 30 min  Stress: No Stress Concern Present   Feeling of Stress : Not at all  Social Connections: Moderately Integrated   Frequency of Communication with Friends and Family: More than three times a week   Frequency of Social Gatherings with Friends and Family: Once a week   Attends Religious Services: More than 4 times per year   Active Member of [employer] or Organizations: Yes   Attends Music therapist: More than 4 times per year   Marital Status: Divorced    Tobacco Counseling Counseling given: Not Answered Tobacco comments: Regular Exercise-No   Clinical Intake:  Pre-visit preparation completed: Yes  Pain : No/denies pain Pain Score: 0-No pain     BMI - recorded: 24 Nutritional Status: BMI of 19-24  Normal Nutritional Risks: None Diabetes: No  How often do you need to have someone  help you when you read instructions, pamphlets, or other written materials from your doctor or pharmacy?: 1 - Never What is the last grade level you completed in school?: High School Graduate  Diabetic? no  Interpreter Needed?: No  Information entered by :: Lisette Abu, LPN   Activities of Daily Living In your present state of health, do you have any difficulty performing the following activities: 11/06/2020 09/01/2020  Hearing? N N  Vision? N N  Difficulty concentrating or making decisions? N N  Walking or climbing stairs? N N  Dressing or bathing? N N  Doing errands, shopping? N N  Preparing Food and eating ? N -  Using the Toilet? N -  In the past six months, have you accidently leaked urine? N -  Do you have problems with loss of bowel control? N -  Managing your Medications? N -  Managing your Finances? N -  Housekeeping or managing your Housekeeping? N -  Some recent data might be hidden    Patient Care Team: [name], MD as PCP - General (Internal Medicine) Dorothy Spark, MD (Inactive) as PCP - Cardiology (Cardiology) Charlton Haws, [facility] as Pharmacist (Pharmacist) [name], MD as Consulting Physician (Cardiology) Rutherford Guys, MD as Consulting Physician (Ophthalmology)  Indicate any recent Medical Services you may have received from other than [facility] providers in the past year (date may be approximate).     Assessment:   This is a routine wellness examination for [place].  Hearing/Vision screen Hearing Screening - Comments:: Patient wears hearing aids. Vision Screening - Comments:: Patient wears reading glasses.  Vision checked every year by Dr. Rutherford Guys.  Dietary issues and exercise activities discussed: Current Exercise Habits: Home exercise routine, Type of exercise: walking, Time (Minutes): 30, Frequency (Times/Week): 7, Weekly Exercise (Minutes/Week): 210, Intensity: Moderate, Exercise limited by: cardiac condition(s)   Goals  Addressed   None   Depression Screen PHQ 2/9 Scores 11/06/2020 09/01/2020 08/30/2019 05/08/2018 01/19/2017 04/22/2015  PHQ - 2 Score 0 0 0 1 1 0  PHQ- 9 Score - - - - 1 -    Fall Risk Fall Risk  11/06/2020 09/01/2020 08/30/2019 11/01/2018 05/08/2018  Falls in the past year? 0 0 0 0 0  Number falls in past yr: 0 0 0 0 0  Injury with Fall? 0 0 0 0 0  Risk for fall due to : No Fall Risks No Fall Risks No Fall Risks - -  Follow up Falls evaluation completed Falls evaluation completed Falls evaluation completed Falls evaluation completed Falls evaluation completed    [place]:  Any stairs in or around the home? No  If so, are there any without handrails? No  Home free of loose throw rugs in walkways, pet beds, electrical cords, etc? Yes  Adequate lighting in your home to reduce risk of falls? Yes   ASSISTIVE DEVICES UTILIZED TO PREVENT FALLS:  Life alert? No  Use of a cane, walker or w/c?  no Grab bars in the bathroom? Yes  Shower chair or bench in shower? Yes  Elevated toilet seat or a handicapped toilet? Yes   TIMED UP AND GO:  Was the test performed? Yes .  Length of time to ambulate 10 feet: 6 sec.   Gait steady and fast without use of assistive device  Cognitive Function: Normal cognitive status assessed by direct observation by this Nurse Health Advisor. No abnormalities found.   MMSE - Mini Mental State Exam 01/19/2017 04/22/2015  Not completed: - (No Data)  Orientation to time 5 -  Orientation to Place 5 -  Registration 3 -  Attention/ Calculation 5 -  Recall 2 -  Language- name 2 objects 2 -  Language- repeat 1 -  Language- follow 3 step command 3 -  Language- read & follow direction 1 -  Write a sentence 1 -  Copy design 1 -  Total score 29 -        Immunizations Immunization History  Administered Date(s) Administered   Fluad Quad(high Dose 65+) 01/04/2019   Influenza Whole 01/06/2010, 01/19/2012   Influenza, High Dose Seasonal  PF 01/23/2015, 01/20/2016, 01/19/2017, 01/03/2018   Influenza,inj,Quad PF,6+ Mos 02/08/2014   Moderna Sars-Covid-2 Vaccination 05/29/2019, 06/26/2019, 02/20/2020   Pneumococcal Conjugate-13 04/22/2015   Pneumococcal Polysaccharide-23 06/22/2012   Td 12/25/2009   Zoster, Live 12/25/2009    TDAP status: Due, Education has been provided regarding the importance of this vaccine. Advised may receive this vaccine at local pharmacy or Health Dept. Aware to provide a copy of the vaccination record if obtained from local pharmacy or Health Dept. Verbalized acceptance and understanding.  Flu Vaccine status: Up to date  Pneumococcal vaccine status: Up to date  Covid-19 vaccine status: Completed vaccines  Qualifies for Shingles Vaccine? Yes   Zostavax completed Yes   Shingrix Completed?: No.    Education has been provided regarding the importance of this vaccine. Patient has been advised to call insurance company to determine out of pocket expense if they have not yet received this vaccine. Advised may also receive vaccine at local pharmacy or Health Dept. Verbalized acceptance and understanding.  Screening Tests Health Maintenance  Topic Date Due   Zoster Vaccines- Shingrix (1 of 2) Never done   TETANUS/TDAP  12/26/2019   COLONOSCOPY (Pts 45-74yrs Insurance coverage will need to be confirmed)  04/15/2020   COVID-19 Vaccine (4 - Booster for Moderna series) 06/22/2020   INFLUENZA VACCINE  11/24/2020   DEXA SCAN  Completed   Hepatitis C Screening  Completed   PNA vac Low Risk Adult  Completed   HPV VACCINES  Aged Out    Health Maintenance  Health Maintenance Due  Topic Date Due   Zoster Vaccines- Shingrix (1 of 2) Never done   TETANUS/TDAP  12/26/2019   COLONOSCOPY (Pts 45-62yrs Insurance coverage will need to be confirmed)  04/15/2020   COVID-19 Vaccine (4 - Booster for Moderna series) 06/22/2020    Colorectal cancer screening: Type of screening: Colonoscopy. Completed 04/15/2017.  Repeat every 3 years  Mammogram status: Completed 10/01/2019. Repeat every year  Bone Density status: Completed 09/29/2020. Results reflect: Bone density results: OSTEOPOROSIS. Repeat every 2 years.  Lung Cancer Screening: (Low Dose CT Chest recommended if Age 73-80 years, 30 pack-year currently smoking OR have quit w/in 15years.) does not qualify.   Lung Cancer Screening Referral: no  Additional Screening:  Hepatitis C Screening: does qualify; Completed yes  Vision Screening: Recommended annual ophthalmology exams for early detection of glaucoma and other disorders of the eye. Is the patient up to date with their annual eye exam?  Yes  Who is the provider or what is the name of the office in which the patient attends annual eye exams? Rutherford Guys, MD If pt is not established with a provider, would they like to be referred to a provider to establish care? No .   Dental Screening: Recommended annual dental exams for proper oral hygiene  Community Resource Referral / Chronic Care Management: CRR required this visit?  No   CCM required this visit?  No      Plan:     I have personally reviewed and noted the following in the patient's chart:   Medical and social history Use of alcohol, tobacco or illicit drugs  Current medications and supplements including opioid prescriptions.  Functional ability and status Nutritional status Physical activity Advanced directives List of other physicians Hospitalizations, surgeries, and ER visits in previous 12 months Vitals Screenings to include cognitive, depression,  and falls Referrals and appointments  In addition, I have reviewed and discussed with patient certain preventive protocols, quality metrics, and best practice recommendations. A written personalized care plan for preventive services as well as general preventive health recommendations were provided to patient.     Sheral Flow, LPN   7/98/1025   Nurse Notes:  n/a

## 2020-12-04 DIAGNOSIS — Z1231 Encounter for screening mammogram for malignant neoplasm of breast: Secondary | ICD-10-CM | POA: Diagnosis not present

## 2020-12-04 LAB — HM MAMMOGRAPHY

## 2021-01-27 ENCOUNTER — Telehealth: Payer: Self-pay | Admitting: Pharmacist

## 2021-01-27 NOTE — Progress Notes (Signed)
Chronic Care Management Pharmacy Assistant   Name: Carrie [name]  MRN: [id number] DOB: 11-Jun-1945   Reason for Encounter: Disease State   Conditions to be addressed/monitored: HTN  Recent office visits:  None ID  Recent consult visits:  None ID  Hospital visits:  None since last coordination call  Medications: Outpatient Encounter Medications as of 01/27/2021  Medication Sig   CALCIUM PO Take 1 tablet by mouth daily.    Cholecalciferol (VITAMIN D3) 1000 UNITS CAPS Take 1 capsule by mouth daily.   ELIQUIS 5 MG TABS tablet Take 1 tablet (5 mg total) by mouth 2 (two) times daily.   fish oil-omega-3 fatty acids 1000 MG capsule Take 1 g by mouth daily. (Patient not taking: Reported on 11/06/2020)   metoprolol succinate (TOPROL-XL) 50 MG 24 hr tablet TAKE 1 TABLET (50 MG TOTAL) BY MOUTH DAILY. TAKE WITH OR IMMEDIATELY FOLLOWING A MEAL.   nitroGLYCERIN (NITROLINGUAL) 0.4 MG/SPRAY spray Place 1 spray under the tongue every 5 (five) minutes x 3 doses as needed.    omeprazole (PRILOSEC) 40 MG capsule Take 40 mg by mouth daily.   rosuvastatin (CRESTOR) 20 MG tablet TAKE 1 TABLET BY MOUTH EVERY DAY   Thiamine HCl (VITAMIN B-1) 250 MG tablet Take 500 mg by mouth daily.   No facility-administered encounter medications on file as of 01/27/2021.    Recent Office Vitals: BP Readings from Last 3 Encounters:  11/06/20 120/70  09/16/20 130/72  09/01/20 138/68   Pulse Readings from Last 3 Encounters:  11/06/20 84  09/16/20 69  09/01/20 80    Wt Readings from Last 3 Encounters:  11/06/20 127 lb (57.6 kg)  09/16/20 129 lb 12.8 oz (58.9 kg)  09/01/20 128 lb 3.2 oz (58.2 kg)     Kidney Function Lab Results  Component Value Date/Time   CREATININE 0.56 (L) 05/21/2020 10:24 AM   CREATININE 0.67 03/19/2019 10:51 AM   CREATININE 0.75 03/16/2016 10:52 AM   GFR 98.54 01/03/2018 11:20 AM   GFRNONAA 92 05/21/2020 10:24 AM   GFRAA 105 05/21/2020 10:24 AM    BMP Latest Ref Rng &  Units 05/21/2020 03/19/2019 01/03/2018  Glucose 65 - 99 mg/dL 89 92 86  BUN 8 - 27 mg/dL 8 6(L) 14  Creatinine 0.57 - 1.00 mg/dL 0.56(L) 0.67 0.63  BUN/Creat Ratio 12 - 28 14 9(L) -  Sodium 134 - 144 mmol/L 140 141 139  Potassium 3.5 - 5.2 mmol/L 4.2 4.0 4.3  Chloride 96 - 106 mmol/L 103 102 103  CO2 20 - 29 mmol/L 24 24 28   Calcium 8.7 - 10.3 mg/dL 9.0 9.0 9.8     Contacted patient on 01/27/21 to discuss hypertension disease state  Current antihypertensive regimen:  Metoprolol 50 mg 1 tab daily (afib)  Patient verbally confirms she is taking the above medications as directed. Yes  How often are you checking your Blood Pressure? daily, patient states she never has any issues with her blood pressure.  she checks her blood pressure in the morning before taking her medication.  Current home BP readings: 113/69  74 01/27/21   Wrist or arm cuff:wrist cuff Caffeine intake: 1 cup of coffee in the morning Salt intake:very little OTC medications including pseudoephedrine or NSAIDs? Tylenol  Any readings above 180/120? No If yes any symptoms of hypertensive emergency? patient denies any symptoms of high blood pressure   What recent interventions/DTPs have been made by any provider to improve Blood Pressure control since last CPP Visit: None  ID  Any recent hospitalizations or ED visits since last visit with CPP? No  What diet changes have been made to improve Blood Pressure Control?  Patient has not made any changes to her diet  What exercise is being done to improve your Blood Pressure Control?  Patient walks about 1 mile a day  Adherence Review: Is the patient currently on ACE/ARB medication? No Does the patient have >5 day gap between last estimated fill dates? No   Star Rating Drugs:  Medication:  Last Fill: Day Supply Rosuvastatin 20 mg 11/20/20 90   Care Gaps: Annual wellness visit in last year? Yes Most Recent BP reading:120/70  11/06/20    CCM appointment on 10/30/21     Ethelene Hal Clinical Pharmacist Assistant [phone]

## 2021-02-28 NOTE — Telephone Encounter (Signed)
Prior Auth required for Prolia  PA PROCESS DETAILS: PA is required and is currently not on file. Call [phone] or complete the PA form available at [email or web address]

## 2021-02-28 NOTE — Telephone Encounter (Signed)
Prolia VOB initiated via [email or web address]  Last OV:  Next OV:  Last Prolia inj: 09/01/20 Next Prolia inj DUE: 03/05/21

## 2021-03-02 NOTE — Telephone Encounter (Signed)
PA pending PA# [id number]

## 2021-03-05 ENCOUNTER — Other Ambulatory Visit: Payer: Self-pay

## 2021-03-05 ENCOUNTER — Ambulatory Visit (INDEPENDENT_AMBULATORY_CARE_PROVIDER_SITE_OTHER): Payer: Medicare HMO

## 2021-03-05 DIAGNOSIS — M810 Age-related osteoporosis without current pathological fracture: Secondary | ICD-10-CM | POA: Diagnosis not present

## 2021-03-05 MED ORDER — DENOSUMAB 60 MG/ML ~~LOC~~ SOSY
60.0000 mg | PREFILLED_SYRINGE | Freq: Once | SUBCUTANEOUS | Status: AC
  Administered 2021-03-05: 60 mg via SUBCUTANEOUS

## 2021-03-05 NOTE — Progress Notes (Signed)
Pt given Prolia w/o any complications.

## 2021-03-06 NOTE — Telephone Encounter (Signed)
Pt ready for scheduling on or after 03/05/21  Out-of-pocket cost due at time of visit: $295  Primary: [employer] Prolia co-insurance: 20% (approximately $270) Admin fee co-insurance: 20% (approximately $25)  Secondary: n/a Prolia co-insurance:  Admin fee co-insurance:   Deductible: does not apply  Prior Auth: APPROVED PA# [id number] Valid: 03/02/21-03/02/22  ** This summary of benefits is an estimation of the patient's out-of-pocket cost. Exact cost may vary based on individual plan coverage.

## 2021-03-23 ENCOUNTER — Other Ambulatory Visit: Payer: Self-pay

## 2021-03-23 ENCOUNTER — Ambulatory Visit (HOSPITAL_COMMUNITY): Payer: Medicare HMO | Attending: Cardiovascular Disease

## 2021-03-23 ENCOUNTER — Ambulatory Visit: Payer: Medicare HMO | Admitting: Cardiovascular Disease

## 2021-03-23 ENCOUNTER — Encounter: Payer: Self-pay | Admitting: Cardiovascular Disease

## 2021-03-23 VITALS — BP 120/90 | HR 71 | Ht 61.0 in | Wt 129.4 lb

## 2021-03-23 DIAGNOSIS — I480 Paroxysmal atrial fibrillation: Secondary | ICD-10-CM

## 2021-03-23 DIAGNOSIS — I340 Nonrheumatic mitral (valve) insufficiency: Secondary | ICD-10-CM

## 2021-03-23 DIAGNOSIS — I350 Nonrheumatic aortic (valve) stenosis: Secondary | ICD-10-CM | POA: Diagnosis not present

## 2021-03-23 DIAGNOSIS — I2510 Atherosclerotic heart disease of native coronary artery without angina pectoris: Secondary | ICD-10-CM | POA: Diagnosis not present

## 2021-03-23 LAB — ECHOCARDIOGRAM COMPLETE
AR max vel: 0.86 cm2
AV Area VTI: 0.72 cm2
AV Area mean vel: 0.78 cm2
AV Mean grad: 46 mmHg
AV Peak grad: 69.6 mmHg
Ao pk vel: 4.17 m/s
Area-P 1/2: 3.77 cm2
MV M vel: 6.75 m/s
MV Peak grad: 182.3 mmHg
MV VTI: 0.03 cm2
P 1/2 time: 337 msec
Radius: 1.2 cm
S' Lateral: 3.1 cm

## 2021-03-23 NOTE — Progress Notes (Deleted)
Pre Surgical Assessment: 5 M Walk Test  66M=  5 Meter Walk Test- trial 1: 5.11seconds 5 Meter Walk Test- trial 2: 7.72 seconds 5 Meter Walk Test- trial 3: 7.78 seconds 5 Meter Walk Test Average: 6.87seconds

## 2021-03-23 NOTE — H&P (View-Only) (Signed)
Cardiology Office Note:    Date:  03/24/2021   ID:  Renezmae, Canlas 11/10/1945, MRN [id number]  PCP:  [name], MD   [facility] Providers Cardiologist:  [name], MD (Inactive)     Referring MD: [name], MD   Chief Complaint  Patient presents with   Aortic Stenosis    History of Present Illness:    Carrie [name] is a 75 y.o. female with a hx of aortic stenosis, presenting for follow-up evaluation.  The patient has a history of moderate nonobstructive coronary artery disease involving the LAD, managed medically without symptoms of angina.  She had a remote SVT ablation performed.  She has been followed for progressive aortic stenosis now for greater than a decade dating back to when she had only mild aortic stenosis.  Comorbid cardiac conditions include hypertrophic cardiomyopathy, paroxysmal atrial fibrillation.  When I saw her in May 2022, she had undergone an echo study demonstrating vigorous LV systolic function, severe asymmetric LVH with systolic anterior motion of the mitral valve, LVOT obstruction, and severe mitral regurgitation.  There is also severe aortic stenosis with a mean transvalvular gradient of 44 mmHg.  Because of her asymptomatic clinical status with a good workload, walking greater than 1 mile daily at that time, we elected to proceed with further testing and clinical surveillance.  The patient's exercise stress echocardiogram was performed and demonstrated no high risk hemodynamic findings.  BNP was elevated at 1305.  The patient returns today for follow-up evaluation.  The patient is here with her daughter today.  Since I have last seen her, she has had mild progression of symptoms.  She denies shortness of breath, chest pain, chest pressure, lightheadedness, heart palpitations, or syncope.  She has not had leg swelling, orthopnea, or PND.  However, she does report progressive fatigue and exercise intolerance.  She was  exhausted after Thanksgiving preparations this year, reporting this is a clear change compared to previous years.  She is not able to do as much as she has in the past.  She otherwise reports no significant change in her overall symptoms.  Past Medical History:  Diagnosis Date   Aortic stenosis    Coronary artery disease    a. Prior LHC (07/24/03): LMCA heavily calcified without significant stenosis, LAD with heavy calcification proximally. 50-60% stenosis just distal to D2, LCx calcified proximally without significant stenosis, Dominant RCA with moderate proximal calcification and mild luminal irregularities throughout, LVEDP 12, LVEF 93%   Diastolic dysfunction without heart failure    Former tobacco use    Former tobacco use    Heart murmur    HOCM (hypertrophic obstructive cardiomyopathy) (HCC)    Mild AI (aortic insufficiency)    Osteoporosis, unspecified    Other and unspecified hyperlipidemia    Paroxysmal atrial flutter ([place])    Pneumonia    Wears hearing aid    left    Past Surgical History:  Procedure Laterality Date   ATRIAL ABLATION SURGERY  2005   BREAST SURGERY  1982   Benign tumor removed RT   BUNIONECTOMY  04/07/2012   Procedure: BUNIONECTOMY;  Surgeon: Alta Corning, MD;  Location: [place];  Service: Orthopedics;  Laterality: Left;  CHEVRON OSTEOTOMY LEFT FOOT    CARDIAC CATHETERIZATION  2005   GANGLION CYST EXCISION     left wrist   HERNIA REPAIR  1990   rt ing   TONSILLECTOMY     TUBAL LIGATION  Current Medications: Current Meds  Medication Sig   CALCIUM PO Take 1 tablet by mouth daily.    Cholecalciferol (VITAMIN D3) 1000 UNITS CAPS Take 1 capsule by mouth daily.   denosumab (PROLIA) 60 MG/ML SOSY injection Inject 60 mg into the skin every 6 (six) months.   ELIQUIS 5 MG TABS tablet Take 1 tablet (5 mg total) by mouth 2 (two) times daily.   fish oil-omega-3 fatty acids 1000 MG capsule Take 1 g by mouth daily.   metoprolol succinate  (TOPROL-XL) 50 MG 24 hr tablet TAKE 1 TABLET (50 MG TOTAL) BY MOUTH DAILY. TAKE WITH OR IMMEDIATELY FOLLOWING A MEAL.   nitroGLYCERIN (NITROLINGUAL) 0.4 MG/SPRAY spray Place 1 spray under the tongue every 5 (five) minutes x 3 doses as needed.    rosuvastatin (CRESTOR) 20 MG tablet TAKE 1 TABLET BY MOUTH EVERY DAY   Thiamine HCl (VITAMIN B-1) 250 MG tablet Take 500 mg by mouth daily.     Allergies:   Codeine, Crestor [rosuvastatin], and Latex   Social History   Socioeconomic History   Marital status: Divorced    Spouse name: Not on file   Number of children: 2   Years of education: Not on file   Highest education level: Not on file  Occupational History   Occupation: retired  Tobacco Use   Smoking status: Former    Types: Cigarettes    Quit date: 04/05/1999    Years since quitting: 21.9   Smokeless tobacco: Never   Tobacco comments:    Regular Exercise-No  Vaping Use   Vaping Use: Never used  Substance and Sexual Activity   Alcohol use: Yes    Alcohol/week: 7.0 standard drinks    Types: 7 Glasses of wine per week    Comment: 1 drink per week   Drug use: No   Sexual activity: Not on file  Other Topics Concern   Not on file  Social History Narrative   Not on file   Social Determinants of Health   Financial Resource Strain: Not on file  Food Insecurity: No Food Insecurity   Worried About Running Out of Food in the Last Year: Never true   Ran Out of Food in the Last Year: Never true  Transportation Needs: No Transportation Needs   Lack of Transportation (Medical): No   Lack of Transportation (Non-Medical): No  Physical Activity: Sufficiently Active   Days of Exercise per Week: 7 days   Minutes of Exercise per Session: 30 min  Stress: No Stress Concern Present   Feeling of Stress : Not at all  Social Connections: Moderately Integrated   Frequency of Communication with Friends and Family: More than three times a week   Frequency of Social Gatherings with Friends and  Family: Once a week   Attends Religious Services: More than 4 times per year   Active Member of [employer] or Organizations: Yes   Attends Music therapist: More than 4 times per year   Marital Status: Divorced     Family History: The patient's family history includes Congestive Heart Failure in her mother; Dementia in her mother; Heart disease (age of onset: 90) in her mother; Hypertension in an other family member; Valvular heart disease in her father. There is no history of Colon cancer or Rectal cancer.  ROS:   Please see the history of present illness.    All other systems reviewed and are negative.  EKGs/Labs/Other Studies Reviewed:    The following studies were reviewed  today: Echo: 1. Left ventricular ejection fraction, by estimation, is 60 to 65%. The  left ventricle has normal function. The left ventricle has no regional  wall motion abnormalities. There is severe asymmetric left ventricular  hypertrophy of the basal-septal  segment. LVOT obstruction with peak gradient 3mmHg. Left ventricular  diastolic parameters are consistent with Grade II diastolic dysfunction  (pseudonormalization). Elevated left atrial pressure.   2. Right ventricular systolic function is normal. The right ventricular  size is normal. There is normal pulmonary artery systolic pressure. The  estimated right ventricular systolic pressure is 16.1 mmHg.   3. Left atrial size was moderately dilated.   4. The mitral valve is degenerative. Moderate to severe mitral valve  regurgitation. Mild mitral stenosis. MG 2mmHg at 74bpm, MVA 1.6 cm^2 by  continuity equation. Severe mitral annular calcification.   5. The inferior vena cava is normal in size with greater than 50%  respiratory variability, suggesting right atrial pressure of 3 mmHg.   6. The aortic valve is tricuspid. There is severe calcifcation of the  aortic valve. Aortic valve regurgitation is moderate. Severe aortic valve  stenosis.  Concomitant LVOT obstruction complicates evaluation of AS  severity, but appears severe. Vmax 4.2  m/s, MG 46 mmHg, AVA 0.7 cm^2, DI 0.32   EKG:  EKG is not ordered today.    Recent Labs: 05/21/2020: ALT 21; BUN 8; Creatinine, Ser 0.56; Hemoglobin 13.5; Platelets 305; Potassium 4.2; Sodium 140; TSH 1.150 09/16/2020: NT-Pro BNP 1,305  Recent Lipid Panel    Component Value Date/Time   CHOL 199 05/21/2020 1024   TRIG 175 (H) 05/21/2020 1024   HDL 78 05/21/2020 1024   CHOLHDL 2.6 05/21/2020 1024   CHOLHDL 2.3 08/18/2015 1030   VLDL 22 08/18/2015 1030   LDLCALC 92 05/21/2020 1024   LDLDIRECT 133.8 12/19/2012 0828     Risk Assessment/Calculations:    CHA2DS2-VASc Score = 6   This indicates a 9.7% annual risk of stroke. The patient's score is based upon: CHF History: 1 HTN History: 1 Diabetes History: 0 Stroke History: 0 Vascular Disease History: 1 Age Score: 2 Gender Score: 1         Physical Exam:    VS:  BP 120/90    Pulse 71    Ht 5\' 1"  (1.549 m)    Wt 129 lb 6.4 oz (58.7 kg)    SpO2 96%    BMI 24.45 kg/m     Wt Readings from Last 3 Encounters:  03/23/21 129 lb 6.4 oz (58.7 kg)  11/06/20 127 lb (57.6 kg)  09/16/20 129 lb 12.8 oz (58.9 kg)     GEN: Well nourished, well developed in no acute distress HEENT: Normal NECK: No JVD; No carotid bruits LYMPHATICS: No lymphadenopathy CARDIAC: RRR, 3/6 systolic murmur at the RUSB, no diastolic murmur RESPIRATORY:  Clear to auscultation without rales, wheezing or rhonchi  ABDOMEN: Soft, non-tender, non-distended MUSCULOSKELETAL:  No edema; No deformity  SKIN: Warm and dry NEUROLOGIC:  Alert and oriented x 3 PSYCHIATRIC:  Normal affect   ASSESSMENT:    1. Severe aortic stenosis   2. Coronary artery disease involving native coronary artery of native heart without angina pectoris   3. Paroxysmal atrial fibrillation (HCC)    PLAN:    In order of problems listed above:  75 year old woman with severe, stage D1 aortic  stenosis and New York Heart Association functional class II symptoms of progressive fatigue.  We discussed management strategies and further diagnostic testing at length today.  We reviewed treatment options including TAVR, surgical aortic valve replacement, and ongoing surveillance with medical therapy.  The patient's echo images are personally reviewed and they demonstrate severe calcific aortic stenosis with severely reduced aortic valve leaflet mobility and Doppler findings confirming severe aortic stenosis with a peak velocity of 4.2 m/s and mean gradient of 46 mmHg.  The dimensionless index is complicated by the presence of subvalvular obstruction with severe asymmetric LVH involving the basal septum and a baseline LVOT gradient of 21 mmHg.  There are also degenerative changes of the mitral valve with severe mitral annular calcification, mild mitral stenosis with a transmitral mean gradient of 5 mmHg, and moderate to severe mitral regurgitation. At the time of the patient's previous evaluation, her NT-proBNP was elevated at 1305, likely a result of her valvular heart disease and severe LVH.  The patient understands that her treatment decisions are complicated by the presence of multivalve disease.  I have suggested that we review her case in our multidisciplinary heart valve team meeting.  I suspect surgical treatment may be the best option because I think her mitral valve disease is significant.  Alternatively, if she is not considered a candidate for double valve surgery, TAVR would be a reasonable treatment consideration.  I will contact her in follow-up after we review her case later this week in our multidisciplinary heart valve team meeting.  If we move forward with work-up, we will plan on CT angiography and cardiac catheterization studies to further evaluate her cardiac and peripheral vascular anatomy and reevaluate her coronary artery disease and hemodynamics. No anginal symptoms.  Plan to proceed  with cardiac catheterization in the future as we evaluate her for treatment of her valvular heart disease. Appears to be maintaining sinus rhythm.  Continue metoprolol succinate.  Anticoagulated with apixaban.           Medication Adjustments/Labs and Tests Ordered: Current medicines are reviewed at length with the patient today.  Concerns regarding medicines are outlined above.  No orders of the defined types were placed in this encounter.  No orders of the defined types were placed in this encounter.   Patient Instructions  Medication Instructions:  No changes *If you need a refill on your cardiac medications before your next appointment, please call your pharmacy*   Lab Work: None today.  If you have labs (blood work) drawn today and your tests are completely normal, you will receive your results only by: [place] (if you have MyChart) OR A paper copy in the mail If you have any lab test that is abnormal or we need to change your treatment, we will call you to review the results.   Testing/Procedures: none   Follow-Up:  Dr. Burt Knack will give you a call after his meeting with the Valve Team tomorrow.      Signed, [name], MD  03/24/2021 6:37 AM    [place]

## 2021-03-23 NOTE — Progress Notes (Signed)
Cardiology Office Note:    Date:  03/24/2021   ID:  Carrie, [name] July 15, 1945, MRN [id number]  PCP:  [name], MD   [facility] Providers Cardiologist:  [name], MD (Inactive)     Referring MD: [name], MD   Chief Complaint  Patient presents with   Aortic Stenosis    History of Present Illness:    Carrie [name] is a 75 y.o. female with a hx of aortic stenosis, presenting for follow-up evaluation.  The patient has a history of moderate nonobstructive coronary artery disease involving the LAD, managed medically without symptoms of angina.  She had a remote SVT ablation performed.  She has been followed for progressive aortic stenosis now for greater than a decade dating back to when she had only mild aortic stenosis.  Comorbid cardiac conditions include hypertrophic cardiomyopathy, paroxysmal atrial fibrillation.  When I saw her in May 2022, she had undergone an echo study demonstrating vigorous LV systolic function, severe asymmetric LVH with systolic anterior motion of the mitral valve, LVOT obstruction, and severe mitral regurgitation.  There is also severe aortic stenosis with a mean transvalvular gradient of 44 mmHg.  Because of her asymptomatic clinical status with a good workload, walking greater than 1 mile daily at that time, we elected to proceed with further testing and clinical surveillance.  The patient's exercise stress echocardiogram was performed and demonstrated no high risk hemodynamic findings.  BNP was elevated at 1305.  The patient returns today for follow-up evaluation.  The patient is here with her daughter today.  Since I have last seen her, she has had mild progression of symptoms.  She denies shortness of breath, chest pain, chest pressure, lightheadedness, heart palpitations, or syncope.  She has not had leg swelling, orthopnea, or PND.  However, she does report progressive fatigue and exercise intolerance.  She was  exhausted after Thanksgiving preparations this year, reporting this is a clear change compared to previous years.  She is not able to do as much as she has in the past.  She otherwise reports no significant change in her overall symptoms.  Past Medical History:  Diagnosis Date   Aortic stenosis    Coronary artery disease    a. Prior LHC (07/24/03): LMCA heavily calcified without significant stenosis, LAD with heavy calcification proximally. 50-60% stenosis just distal to D2, LCx calcified proximally without significant stenosis, Dominant RCA with moderate proximal calcification and mild luminal irregularities throughout, LVEDP 12, LVEF 40%   Diastolic dysfunction without heart failure    Former tobacco use    Former tobacco use    Heart murmur    HOCM (hypertrophic obstructive cardiomyopathy) (HCC)    Mild AI (aortic insufficiency)    Osteoporosis, unspecified    Other and unspecified hyperlipidemia    Paroxysmal atrial flutter ([place])    Pneumonia    Wears hearing aid    left    Past Surgical History:  Procedure Laterality Date   ATRIAL ABLATION SURGERY  2005   BREAST SURGERY  1982   Benign tumor removed RT   BUNIONECTOMY  04/07/2012   Procedure: BUNIONECTOMY;  Surgeon: Alta Corning, MD;  Location: [facility];  Service: Orthopedics;  Laterality: Left;  CHEVRON OSTEOTOMY LEFT FOOT    CARDIAC CATHETERIZATION  2005   GANGLION CYST EXCISION     left wrist   HERNIA REPAIR  1990   rt ing   TONSILLECTOMY     TUBAL LIGATION  Current Medications: Current Meds  Medication Sig   CALCIUM PO Take 1 tablet by mouth daily.    Cholecalciferol (VITAMIN D3) 1000 UNITS CAPS Take 1 capsule by mouth daily.   denosumab (PROLIA) 60 MG/ML SOSY injection Inject 60 mg into the skin every 6 (six) months.   ELIQUIS 5 MG TABS tablet Take 1 tablet (5 mg total) by mouth 2 (two) times daily.   fish oil-omega-3 fatty acids 1000 MG capsule Take 1 g by mouth daily.   metoprolol succinate  (TOPROL-XL) 50 MG 24 hr tablet TAKE 1 TABLET (50 MG TOTAL) BY MOUTH DAILY. TAKE WITH OR IMMEDIATELY FOLLOWING A MEAL.   nitroGLYCERIN (NITROLINGUAL) 0.4 MG/SPRAY spray Place 1 spray under the tongue every 5 (five) minutes x 3 doses as needed.    rosuvastatin (CRESTOR) 20 MG tablet TAKE 1 TABLET BY MOUTH EVERY DAY   Thiamine HCl (VITAMIN B-1) 250 MG tablet Take 500 mg by mouth daily.     Allergies:   Codeine, Crestor [rosuvastatin], and Latex   Social History   Socioeconomic History   Marital status: Divorced    Spouse name: Not on file   Number of children: 2   Years of education: Not on file   Highest education level: Not on file  Occupational History   Occupation: retired  Tobacco Use   Smoking status: Former    Types: Cigarettes    Quit date: 04/05/1999    Years since quitting: 21.9   Smokeless tobacco: Never   Tobacco comments:    Regular Exercise-No  Vaping Use   Vaping Use: Never used  Substance and Sexual Activity   Alcohol use: Yes    Alcohol/week: 7.0 standard drinks    Types: 7 Glasses of wine per week    Comment: 1 drink per week   Drug use: No   Sexual activity: Not on file  Other Topics Concern   Not on file  Social History Narrative   Not on file   Social Determinants of Health   Financial Resource Strain: Not on file  Food Insecurity: No Food Insecurity   Worried About Running Out of Food in the Last Year: Never true   Ran Out of Food in the Last Year: Never true  Transportation Needs: No Transportation Needs   Lack of Transportation (Medical): No   Lack of Transportation (Non-Medical): No  Physical Activity: Sufficiently Active   Days of Exercise per Week: 7 days   Minutes of Exercise per Session: 30 min  Stress: No Stress Concern Present   Feeling of Stress : Not at all  Social Connections: Moderately Integrated   Frequency of Communication with Friends and Family: More than three times a week   Frequency of Social Gatherings with Friends and  Family: Once a week   Attends Religious Services: More than 4 times per year   Active Member of [employer] or Organizations: Yes   Attends Music therapist: More than 4 times per year   Marital Status: Divorced     Family History: The patient's family history includes Congestive Heart Failure in her mother; Dementia in her mother; Heart disease (age of onset: 37) in her mother; Hypertension in an other family member; Valvular heart disease in her father. There is no history of Colon cancer or Rectal cancer.  ROS:   Please see the history of present illness.    All other systems reviewed and are negative.  EKGs/Labs/Other Studies Reviewed:    The following studies were reviewed  today: Echo: 1. Left ventricular ejection fraction, by estimation, is 60 to 65%. The  left ventricle has normal function. The left ventricle has no regional  wall motion abnormalities. There is severe asymmetric left ventricular  hypertrophy of the basal-septal  segment. LVOT obstruction with peak gradient 90mmHg. Left ventricular  diastolic parameters are consistent with Grade II diastolic dysfunction  (pseudonormalization). Elevated left atrial pressure.   2. Right ventricular systolic function is normal. The right ventricular  size is normal. There is normal pulmonary artery systolic pressure. The  estimated right ventricular systolic pressure is 16.1 mmHg.   3. Left atrial size was moderately dilated.   4. The mitral valve is degenerative. Moderate to severe mitral valve  regurgitation. Mild mitral stenosis. MG 82mmHg at 74bpm, MVA 1.6 cm^2 by  continuity equation. Severe mitral annular calcification.   5. The inferior vena cava is normal in size with greater than 50%  respiratory variability, suggesting right atrial pressure of 3 mmHg.   6. The aortic valve is tricuspid. There is severe calcifcation of the  aortic valve. Aortic valve regurgitation is moderate. Severe aortic valve  stenosis.  Concomitant LVOT obstruction complicates evaluation of AS  severity, but appears severe. Vmax 4.2  m/s, MG 46 mmHg, AVA 0.7 cm^2, DI 0.32   EKG:  EKG is not ordered today.    Recent Labs: 05/21/2020: ALT 21; BUN 8; Creatinine, Ser 0.56; Hemoglobin 13.5; Platelets 305; Potassium 4.2; Sodium 140; TSH 1.150 09/16/2020: NT-Pro BNP 1,305  Recent Lipid Panel    Component Value Date/Time   CHOL 199 05/21/2020 1024   TRIG 175 (H) 05/21/2020 1024   HDL 78 05/21/2020 1024   CHOLHDL 2.6 05/21/2020 1024   CHOLHDL 2.3 08/18/2015 1030   VLDL 22 08/18/2015 1030   LDLCALC 92 05/21/2020 1024   LDLDIRECT 133.8 12/19/2012 0828     Risk Assessment/Calculations:    CHA2DS2-VASc Score = 6   This indicates a 9.7% annual risk of stroke. The patient's score is based upon: CHF History: 1 HTN History: 1 Diabetes History: 0 Stroke History: 0 Vascular Disease History: 1 Age Score: 2 Gender Score: 1         Physical Exam:    VS:  BP 120/90   Pulse 71   Ht 5\' 1"  (1.549 m)   Wt 129 lb 6.4 oz (58.7 kg)   SpO2 96%   BMI 24.45 kg/m     Wt Readings from Last 3 Encounters:  03/23/21 129 lb 6.4 oz (58.7 kg)  11/06/20 127 lb (57.6 kg)  09/16/20 129 lb 12.8 oz (58.9 kg)     GEN: Well nourished, well developed in no acute distress HEENT: Normal NECK: No JVD; No carotid bruits LYMPHATICS: No lymphadenopathy CARDIAC: RRR, 3/6 systolic murmur at the RUSB, no diastolic murmur RESPIRATORY:  Clear to auscultation without rales, wheezing or rhonchi  ABDOMEN: Soft, non-tender, non-distended MUSCULOSKELETAL:  No edema; No deformity  SKIN: Warm and dry NEUROLOGIC:  Alert and oriented x 3 PSYCHIATRIC:  Normal affect   ASSESSMENT:    1. Severe aortic stenosis   2. Coronary artery disease involving native coronary artery of native heart without angina pectoris   3. Paroxysmal atrial fibrillation (HCC)    PLAN:    In order of problems listed above:  75 year old woman with severe, stage D1 aortic  stenosis and New York Heart Association functional class II symptoms of progressive fatigue.  We discussed management strategies and further diagnostic testing at length today.  We reviewed treatment options  including TAVR, surgical aortic valve replacement, and ongoing surveillance with medical therapy.  The patient's echo images are personally reviewed and they demonstrate severe calcific aortic stenosis with severely reduced aortic valve leaflet mobility and Doppler findings confirming severe aortic stenosis with a peak velocity of 4.2 m/s and mean gradient of 46 mmHg.  The dimensionless index is complicated by the presence of subvalvular obstruction with severe asymmetric LVH involving the basal septum and a baseline LVOT gradient of 21 mmHg.  There are also degenerative changes of the mitral valve with severe mitral annular calcification, mild mitral stenosis with a transmitral mean gradient of 5 mmHg, and moderate to severe mitral regurgitation. At the time of the patient's previous evaluation, her NT-proBNP was elevated at 1305, likely a result of her valvular heart disease and severe LVH.  The patient understands that her treatment decisions are complicated by the presence of multivalve disease.  I have suggested that we review her case in our multidisciplinary heart valve team meeting.  I suspect surgical treatment may be the best option because I think her mitral valve disease is significant.  Alternatively, if she is not considered a candidate for double valve surgery, TAVR would be a reasonable treatment consideration.  I will contact her in follow-up after we review her case later this week in our multidisciplinary heart valve team meeting.  If we move forward with work-up, we will plan on CT angiography and cardiac catheterization studies to further evaluate her cardiac and peripheral vascular anatomy and reevaluate her coronary artery disease and hemodynamics. No anginal symptoms.  Plan to proceed  with cardiac catheterization in the future as we evaluate her for treatment of her valvular heart disease. Appears to be maintaining sinus rhythm.  Continue metoprolol succinate.  Anticoagulated with apixaban.           Medication Adjustments/Labs and Tests Ordered: Current medicines are reviewed at length with the patient today.  Concerns regarding medicines are outlined above.  No orders of the defined types were placed in this encounter.  No orders of the defined types were placed in this encounter.   Patient Instructions  Medication Instructions:  No changes *If you need a refill on your cardiac medications before your next appointment, please call your pharmacy*   Lab Work: None today.  If you have labs (blood work) drawn today and your tests are completely normal, you will receive your results only by: [place] (if you have MyChart) OR A paper copy in the mail If you have any lab test that is abnormal or we need to change your treatment, we will call you to review the results.   Testing/Procedures: none   Follow-Up:  Dr. Burt Knack will give you a call after his meeting with the Valve Team tomorrow.      Signed, Carrie Mocha, MD  03/24/2021 6:37 AM    [place]

## 2021-03-23 NOTE — Progress Notes (Addendum)
Pre Surgical Assessment: 5 M Walk Test  41M=16.4 ft  5 Meter Walk Test- trial 1: 5.11seconds 5 Meter Walk Test- trial 2: 7.72 seconds 5 Meter Walk Test- trial 3: 7.78 seconds 5 Meter Walk Test Average: 6.87 seconds  ______________________  STS SCORE Procedure: Isolated AVR Risk of Mortality: 3.211% Renal Failure: 1.801% Permanent Stroke: 1.021% Prolonged Ventilation: 7.937% DSW Infection: 0.108% Reoperation: 4.395% Morbidity or Mortality: 13.674% Short Length of Stay: 27.524% Long Length of Stay: 7.307%

## 2021-03-23 NOTE — Patient Instructions (Signed)
Medication Instructions:  No changes *If you need a refill on your cardiac medications before your next appointment, please call your pharmacy*   Lab Work: None today.  If you have labs (blood work) drawn today and your tests are completely normal, you will receive your results only by: [place] (if you have MyChart) OR A paper copy in the mail If you have any lab test that is abnormal or we need to change your treatment, we will call you to review the results.   Testing/Procedures: none   Follow-Up:  Dr. Burt Knack will give you a call after his meeting with the Valve Team tomorrow.

## 2021-03-27 ENCOUNTER — Other Ambulatory Visit: Payer: Self-pay

## 2021-03-27 DIAGNOSIS — I350 Nonrheumatic aortic (valve) stenosis: Secondary | ICD-10-CM

## 2021-03-28 NOTE — Telephone Encounter (Signed)
Last Prolia inj 03/05/21 Next Prolia inj due 09/03/21

## 2021-03-30 ENCOUNTER — Other Ambulatory Visit: Payer: Medicare HMO | Admitting: *Deleted

## 2021-03-30 ENCOUNTER — Other Ambulatory Visit: Payer: Self-pay

## 2021-03-30 DIAGNOSIS — I350 Nonrheumatic aortic (valve) stenosis: Secondary | ICD-10-CM | POA: Diagnosis not present

## 2021-03-30 LAB — BASIC METABOLIC PANEL
BUN/Creatinine Ratio: 12 (ref 12–28)
BUN: 7 mg/dL — ABNORMAL LOW (ref 8–27)
CO2: 22 mmol/L (ref 20–29)
Calcium: 9.2 mg/dL (ref 8.7–10.3)
Chloride: 101 mmol/L (ref 96–106)
Creatinine, Ser: 0.59 mg/dL (ref 0.57–1.00)
Glucose: 98 mg/dL (ref 70–99)
Potassium: 4.1 mmol/L (ref 3.5–5.2)
Sodium: 140 mmol/L (ref 134–144)
eGFR: 94 mL/min/{1.73_m2} (ref >59)

## 2021-03-30 LAB — CBC
Hematocrit: 37.7 % (ref 34.0–46.6)
Hemoglobin: 13 g/dL (ref 11.1–15.9)
MCH: 32.2 pg (ref 26.6–33.0)
MCHC: 34.5 g/dL (ref 31.5–35.7)
MCV: 93 fL (ref 79–97)
Platelets: 339 10*3/uL (ref 150–450)
RBC: 4.04 x10E6/uL (ref 3.77–5.28)
RDW: 12.2 % (ref 11.7–15.4)
WBC: 7.7 10*3/uL (ref 3.4–10.8)

## 2021-04-13 ENCOUNTER — Other Ambulatory Visit: Payer: Self-pay

## 2021-04-13 ENCOUNTER — Other Ambulatory Visit (HOSPITAL_COMMUNITY): Payer: Self-pay | Admitting: Cardiovascular Disease

## 2021-04-13 ENCOUNTER — Ambulatory Visit (HOSPITAL_COMMUNITY)
Admission: RE | Admit: 2021-04-13 | Discharge: 2021-04-13 | Disposition: A | Discharge status: Home / Self Care | Payer: Medicare HMO | Source: Ambulatory Visit | Attending: Cardiovascular Disease | Admitting: Cardiovascular Disease

## 2021-04-13 ENCOUNTER — Encounter (HOSPITAL_COMMUNITY): Payer: Self-pay

## 2021-04-13 DIAGNOSIS — I350 Nonrheumatic aortic (valve) stenosis: Secondary | ICD-10-CM | POA: Diagnosis not present

## 2021-04-13 DIAGNOSIS — I878 Other specified disorders of veins: Secondary | ICD-10-CM | POA: Diagnosis not present

## 2021-04-13 DIAGNOSIS — E041 Nontoxic single thyroid nodule: Secondary | ICD-10-CM | POA: Diagnosis not present

## 2021-04-13 DIAGNOSIS — K5730 Diverticulosis of large intestine without perforation or abscess without bleeding: Secondary | ICD-10-CM | POA: Diagnosis not present

## 2021-04-13 DIAGNOSIS — I517 Cardiomegaly: Secondary | ICD-10-CM | POA: Diagnosis not present

## 2021-04-13 MED ORDER — IOHEXOL 350 MG/ML SOLN
95.0000 mL | Freq: Once | INTRAVENOUS | Status: AC | PRN
  Administered 2021-04-13: 13:00:00 95 mL via INTRAVENOUS

## 2021-04-14 ENCOUNTER — Encounter: Payer: Self-pay | Admitting: Physician Assistant

## 2021-04-21 ENCOUNTER — Telehealth: Payer: Self-pay

## 2021-04-21 NOTE — Telephone Encounter (Signed)
Called patient and reviewed the following pre-cath instructions with the patient. Patient has no questions or concerns at this time.  Cardiac catheterization scheduled at [facility] for:10:00 am [facility] Main Entrance [facility]) at:08:00 am  Diet-no solid food after midnight prior to cath, clear liquids until 5 AM day of procedure.  Medication instructions for procedure:  -Usual morning medications can be taken pre-cath with sips of water including aspirin 81 mg. -Hold Eliquis for two days before the procedure    Confirmed patient has responsible adult to drive home post procedure and be with patient first 24 hours after arriving home.  [facility] does allow one visitor to accompany you and wait in the hospital waiting room while you are there for your procedure.  You and your visitor will be asked to wear a mask once you enter the hospital.  Patient reports does not currently have any new symptoms concerning for COVID-19 and no household members with COVID-19 like illness.

## 2021-04-22 ENCOUNTER — Ambulatory Visit (HOSPITAL_COMMUNITY)
Admission: RE | Disposition: A | Discharge status: Home / Self Care | Payer: Self-pay | Source: Home / Self Care | Attending: Cardiovascular Disease

## 2021-04-22 ENCOUNTER — Encounter (HOSPITAL_COMMUNITY): Payer: Self-pay | Admitting: Cardiovascular Disease

## 2021-04-22 ENCOUNTER — Ambulatory Visit (HOSPITAL_COMMUNITY)
Admission: RE | Admit: 2021-04-22 | Discharge: 2021-04-22 | Disposition: A | Discharge status: Home / Self Care | Payer: Medicare HMO | Attending: Cardiovascular Disease | Admitting: Cardiovascular Disease

## 2021-04-22 DIAGNOSIS — Z7901 Long term (current) use of anticoagulants: Secondary | ICD-10-CM | POA: Insufficient documentation

## 2021-04-22 DIAGNOSIS — I080 Rheumatic disorders of both mitral and aortic valves: Secondary | ICD-10-CM | POA: Diagnosis not present

## 2021-04-22 DIAGNOSIS — I272 Pulmonary hypertension, unspecified: Secondary | ICD-10-CM | POA: Insufficient documentation

## 2021-04-22 DIAGNOSIS — I422 Other hypertrophic cardiomyopathy: Secondary | ICD-10-CM | POA: Diagnosis not present

## 2021-04-22 DIAGNOSIS — I2510 Atherosclerotic heart disease of native coronary artery without angina pectoris: Secondary | ICD-10-CM | POA: Diagnosis not present

## 2021-04-22 DIAGNOSIS — I350 Nonrheumatic aortic (valve) stenosis: Secondary | ICD-10-CM

## 2021-04-22 DIAGNOSIS — Z79899 Other long term (current) drug therapy: Secondary | ICD-10-CM | POA: Diagnosis not present

## 2021-04-22 DIAGNOSIS — I480 Paroxysmal atrial fibrillation: Secondary | ICD-10-CM | POA: Insufficient documentation

## 2021-04-22 HISTORY — PX: RIGHT/LEFT HEART CATH AND CORONARY ANGIOGRAPHY: CATH118266

## 2021-04-22 LAB — POCT I-STAT 7, (LYTES, BLD GAS, ICA,H+H)
Acid-Base Excess: 2 mmol/L (ref 0.0–2.0)
Bicarbonate: 28 mmol/L (ref 20.0–28.0)
Calcium, Ion: 1.14 mmol/L — ABNORMAL LOW (ref 1.15–1.40)
HCT: 34 % — ABNORMAL LOW (ref 36.0–46.0)
Hemoglobin: 11.6 g/dL — ABNORMAL LOW (ref 12.0–15.0)
O2 Saturation: 100 %
Potassium: 3.2 mmol/L — ABNORMAL LOW (ref 3.5–5.1)
Sodium: 141 mmol/L (ref 135–145)
TCO2: 30 mmol/L (ref 22–32)
pCO2 arterial: 49.6 mmHg — ABNORMAL HIGH (ref 32.0–48.0)
pH, Arterial: 7.361 (ref 7.350–7.450)
pO2, Arterial: 209 mmHg — ABNORMAL HIGH (ref 83.0–108.0)

## 2021-04-22 LAB — POCT I-STAT EG7
Acid-Base Excess: 1 mmol/L (ref 0.0–2.0)
Bicarbonate: 27.7 mmol/L (ref 20.0–28.0)
Calcium, Ion: 1.12 mmol/L — ABNORMAL LOW (ref 1.15–1.40)
HCT: 34 % — ABNORMAL LOW (ref 36.0–46.0)
Hemoglobin: 11.6 g/dL — ABNORMAL LOW (ref 12.0–15.0)
O2 Saturation: 76 %
Potassium: 3.2 mmol/L — ABNORMAL LOW (ref 3.5–5.1)
Sodium: 143 mmol/L (ref 135–145)
TCO2: 29 mmol/L (ref 22–32)
pCO2, Ven: 50.9 mmHg (ref 44.0–60.0)
pH, Ven: 7.344 (ref 7.250–7.430)
pO2, Ven: 44 mmHg (ref 32.0–45.0)

## 2021-04-22 SURGERY — RIGHT/LEFT HEART CATH AND CORONARY ANGIOGRAPHY
Anesthesia: LOCAL

## 2021-04-22 MED ORDER — SODIUM CHLORIDE 0.9% FLUSH: 3.0000 mL | Freq: Two times a day (BID) | INTRAVENOUS | Status: DC

## 2021-04-22 MED ORDER — IOHEXOL 350 MG/ML SOLN
INTRAVENOUS | Status: DC | PRN
  Administered 2021-04-22: 09:00:00 40 mL

## 2021-04-22 MED ORDER — SODIUM CHLORIDE 0.9 % IV SOLN: 250.0000 mL | INTRAVENOUS | Status: DC | PRN

## 2021-04-22 MED ORDER — MIDAZOLAM HCL 2 MG/2ML IJ SOLN
INTRAMUSCULAR | Status: AC
  Filled 2021-04-22: qty 2

## 2021-04-22 MED ORDER — LABETALOL HCL 5 MG/ML IV SOLN: 10.0000 mg | INTRAVENOUS | Status: DC | PRN

## 2021-04-22 MED ORDER — VERAPAMIL HCL 2.5 MG/ML IV SOLN
INTRAVENOUS | Status: DC | PRN
  Administered 2021-04-22: 09:00:00 10 mL via INTRA_ARTERIAL

## 2021-04-22 MED ORDER — ONDANSETRON HCL 4 MG/2ML IJ SOLN: 4.0000 mg | Freq: Four times a day (QID) | INTRAMUSCULAR | Status: DC | PRN

## 2021-04-22 MED ORDER — HEPARIN SODIUM (PORCINE) 1000 UNIT/ML IJ SOLN
INTRAMUSCULAR | Status: AC
  Filled 2021-04-22: qty 10

## 2021-04-22 MED ORDER — SODIUM CHLORIDE 0.9 % WEIGHT BASED INFUSION
3.0000 mL/kg/h | INTRAVENOUS | Status: DC
  Administered 2021-04-22: 08:00:00 3 mL/kg/h via INTRAVENOUS

## 2021-04-22 MED ORDER — LIDOCAINE HCL (PF) 1 % IJ SOLN
INTRAMUSCULAR | Status: DC | PRN
  Administered 2021-04-22 (×2): 2 mL
  Administered 2021-04-22: 15 mL

## 2021-04-22 MED ORDER — MIDAZOLAM HCL 2 MG/2ML IJ SOLN
INTRAMUSCULAR | Status: DC | PRN
  Administered 2021-04-22: 2 mg via INTRAVENOUS

## 2021-04-22 MED ORDER — FENTANYL CITRATE (PF) 100 MCG/2ML IJ SOLN
INTRAMUSCULAR | Status: DC | PRN
  Administered 2021-04-22: 25 ug via INTRAVENOUS

## 2021-04-22 MED ORDER — SODIUM CHLORIDE 0.9 % WEIGHT BASED INFUSION: 1.0000 mL/kg/h | INTRAVENOUS | Status: DC

## 2021-04-22 MED ORDER — ASPIRIN 81 MG PO CHEW: 81.0000 mg | CHEWABLE_TABLET | ORAL | Status: DC

## 2021-04-22 MED ORDER — FENTANYL CITRATE (PF) 100 MCG/2ML IJ SOLN
INTRAMUSCULAR | Status: AC
  Filled 2021-04-22: qty 2

## 2021-04-22 MED ORDER — VERAPAMIL HCL 2.5 MG/ML IV SOLN
INTRAVENOUS | Status: AC
  Filled 2021-04-22: qty 2

## 2021-04-22 MED ORDER — SODIUM CHLORIDE 0.9% FLUSH: 3.0000 mL | INTRAVENOUS | Status: DC | PRN

## 2021-04-22 MED ORDER — HEPARIN (PORCINE) IN NACL 1000-0.9 UT/500ML-% IV SOLN
INTRAVENOUS | Status: AC
  Filled 2021-04-22: qty 1000

## 2021-04-22 MED ORDER — HYDRALAZINE HCL 20 MG/ML IJ SOLN: 10.0000 mg | INTRAMUSCULAR | Status: DC | PRN

## 2021-04-22 MED ORDER — LIDOCAINE HCL (PF) 1 % IJ SOLN
INTRAMUSCULAR | Status: AC
  Filled 2021-04-22: qty 30

## 2021-04-22 MED ORDER — HEPARIN (PORCINE) IN NACL 1000-0.9 UT/500ML-% IV SOLN
INTRAVENOUS | Status: DC | PRN
  Administered 2021-04-22 (×2): 500 mL

## 2021-04-22 MED ORDER — ACETAMINOPHEN 325 MG PO TABS: 650.0000 mg | ORAL_TABLET | ORAL | Status: DC | PRN

## 2021-04-22 SURGICAL SUPPLY — 18 items
CATH BALLN WEDGE 5F 110CM (CATHETERS) ×1 IMPLANT
CATH INFINITI 5FR JL4 (CATHETERS) ×1 IMPLANT
CATH INFINITI JR4 5F (CATHETERS) ×1 IMPLANT
CLOSURE MYNX CONTROL 5F (Vascular Products) ×1 IMPLANT
DEVICE RAD TR BAND REGULAR (VASCULAR PRODUCTS) ×1 IMPLANT
GLIDESHEATH SLEND SS 6F .021 (SHEATH) ×1 IMPLANT
GUIDEWIRE ANGLED .035X150CM (WIRE) ×1 IMPLANT
GUIDEWIRE INQWIRE 1.5J.035X260 (WIRE) IMPLANT
INQWIRE 1.5J .035X260CM (WIRE) ×2
KIT HEART LEFT (KITS) ×3 IMPLANT
PACK CARDIAC CATHETERIZATION (CUSTOM PROCEDURE TRAY) ×3 IMPLANT
SHEATH GLIDE SLENDER 4/5FR (SHEATH) ×1 IMPLANT
SHEATH PINNACLE 5F 10CM (SHEATH) ×1 IMPLANT
SHEATH PROBE COVER 6X72 (BAG) ×1 IMPLANT
TRANSDUCER W/STOPCOCK (MISCELLANEOUS) ×3 IMPLANT
TUBING CIL FLEX 10 FLL-RA (TUBING) ×3 IMPLANT
WIRE EMERALD 3MM-J .035X150CM (WIRE) ×1 IMPLANT
WIRE HI TORQ VERSACORE-J 145CM (WIRE) ×1 IMPLANT

## 2021-04-22 NOTE — Discharge Instructions (Signed)
Radial Site Care  This sheet gives you information about how to care for yourself after your procedure. Your health care provider may also give you more specific instructions. If you have problems or questions, contact your health care provider. What can I expect after the procedure? After the procedure, it is common to have: Bruising and tenderness at the catheter insertion area. Follow these instructions at home: Medicines Take over-the-counter and prescription medicines only as told by your health care provider. Insertion site care Follow instructions from your health care provider about how to take care of your insertion site. Make sure you: Wash your hands with soap and water before you remove your bandage (dressing). If soap and water are not available, use hand sanitizer. May remove dressing in 24 hours. Check your insertion site every day for signs of infection. Check for: Redness, swelling, or pain. Fluid or blood. Pus or a bad smell. Warmth. Do no take baths, swim, or use a hot tub for 5 days. You may shower 24-48 hours after the procedure. Remove the dressing and gently wash the site with plain soap and water. Pat the area dry with a clean towel. Do not rub the site. That could cause bleeding. Do not apply powder or lotion to the site. Activity  For 24 hours after the procedure, or as directed by your health care provider: Do not flex or bend the affected arm. Do not push or pull heavy objects with the affected arm. Do not drive yourself home from the hospital or clinic. You may drive 24 hours after the procedure. Do not operate machinery or power tools. KEEP ARM ELEVATED THE REMAINDER OF THE DAY. Do not push, pull or lift anything that is heavier than 10 lb for 5 days. Ask your health care provider when it is okay to: Return to work or school. Resume usual physical activities or sports. Resume sexual activity. General instructions If the catheter site starts to  bleed, raise your arm and put firm pressure on the site. If the bleeding does not stop, get help right away. This is a medical emergency. DRINK PLENTY OF FLUIDS FOR THE NEXT 2-3 DAYS. No alcohol consumption for 24 hours after receiving sedation. If you went home on the same day as your procedure, a responsible adult should be with you for the first 24 hours after you arrive home. Keep all follow-up visits as told by your health care provider. This is important. Contact a health care provider if: You have a fever. You have redness, swelling, or yellow drainage around your insertion site. Get help right away if: You have unusual pain at the radial site. The catheter insertion area swells very fast. The insertion area is bleeding, and the bleeding does not stop when you hold steady pressure on the area. Your arm or hand becomes pale, cool, tingly, or numb. These symptoms may represent a serious problem that is an emergency. Do not wait to see if the symptoms will go away. Get medical help right away. Call your local emergency services (911 in the [place] Do not drive yourself to the hospital. Summary After the procedure, it is common to have bruising and tenderness at the site. Follow instructions from your health care provider about how to take care of your radial site wound. Check the wound every day for signs of infection.  This information is not intended to replace advice given to you by your health care provider. Make sure you discuss any questions you have with   your health care provider. Document Revised: 05/18/2017 Document Reviewed: 05/18/2017 Elsevier Patient Education  2020 Elsevier Inc.  

## 2021-04-22 NOTE — Interval H&P Note (Signed)
History and Physical Interval Note:  04/22/2021 8:23 AM  Carrie [name]  has presented today for surgery, with the diagnosis of severe MR.  The various methods of treatment have been discussed with the patient and family. After consideration of risks, benefits and other options for treatment, the patient has consented to  Procedure(s): RIGHT/LEFT HEART CATH AND CORONARY ANGIOGRAPHY (N/A) as a surgical intervention.  The patient's history has been reviewed, patient examined, no change in status, stable for surgery.  I have reviewed the patient's chart and labs.  Questions were answered to the patient's satisfaction.     [name]

## 2021-04-22 NOTE — Progress Notes (Signed)
Pt ambulated without difficulty or bleeding.   Discharged home with her daughter who will drive and stay with pt x 24 hrs. 

## 2021-04-23 ENCOUNTER — Telehealth: Payer: Self-pay | Admitting: Cardiovascular Disease

## 2021-04-23 MED FILL — Heparin Sodium (Porcine) Inj 1000 Unit/ML: INTRAMUSCULAR | Qty: 10 | Status: AC

## 2021-04-23 NOTE — Telephone Encounter (Signed)
Patient said she had a heart cath yesterday and when she woke up this morning she had a slight cough. She wanted to know what medicine she could take. Please advise

## 2021-04-23 NOTE — Telephone Encounter (Signed)
Pt is calling in to ask what safe OTC medications she can take for allergy/cold like symptoms.  Informed the pt that she can safely take claritin for allergies and as directed, saline nasal spray, coricidin as directed and needed for cough, sugar free cough drops, and if needed for congestion Mucinex WITHOUT D.  Pt denies any cardiac complaints at this time.  Pt states she feels good from a heart perspective and from cath she had done yesterday, just dealing with a small cold, but afebrile.  Pt verbalized understanding and agrees with this plan.  Pt was more than gracious for all the assistance provided.

## 2021-04-26 DIAGNOSIS — R059 Cough, unspecified: Secondary | ICD-10-CM | POA: Diagnosis not present

## 2021-05-04 ENCOUNTER — Other Ambulatory Visit: Payer: Self-pay

## 2021-05-04 ENCOUNTER — Institutional Professional Consult (permissible substitution): Payer: Medicare HMO | Admitting: Surgery

## 2021-05-04 ENCOUNTER — Encounter: Payer: Self-pay | Admitting: Surgery

## 2021-05-04 VITALS — BP 142/79 | HR 66 | Resp 20 | Ht 61.0 in | Wt 126.0 lb

## 2021-05-04 DIAGNOSIS — I350 Nonrheumatic aortic (valve) stenosis: Secondary | ICD-10-CM

## 2021-05-04 NOTE — Progress Notes (Signed)
Patient ID: Carrie [name], female   DOB: May 15, 1945, 76 y.o.   MRN: [id number]  [facility]         [address]       [place] [address]             [phone]          CARDIOTHORACIC SURGERY CONSULTATION REPORT  PCP is Carrie Lima, MD Referring Provider is Carrie Mocha, MD Primary Cardiologist is [name], MD (prior)  Reason for consultation:  Severe aortic stenosis, moderate aortic insufficiency, and moderate to severe mitral regurgitation.  HPI:  The patient is a 76 year old woman with a history of paroxysmal atrial fibrillation, HOCM, moderate nonobstructive LAD disease, and aortic stenosis that has been followed for the past 10 years with echocardiogram.  She had an echo in May 2022 showing severe calcification of the aortic valve with a mean gradient of 44 mmHg consistent with severe aortic stenosis.  The mitral valve was also degenerative with severe mitral regurgitation and systolic anterior motion of mitral valve.  There was moderate to severe mitral annular calcification.  Left ventricular ejection fraction was 65 to 70% with severe asymmetric left ventricular hypertrophy of the septal segment.  The peak gradient was 26 mmHg across the LVOT.  She was asymptomatic and walking greater than 1 mile per day.  She underwent an exercise stress echocardiogram in June 2022 which brought out no symptoms.  There was severe aortic stenosis with a mean gradient of 47 mmHg and severe mitral regurgitation due to systolic anterior motion.  There was moderate aortic insufficiency.  Her BNP was elevated at 1305 on 09/16/2020.  Her most recent echocardiogram on 03/23/2021 showed severe aortic stenosis with a mean gradient of 46 mmHg and a valve area of 0.7 cm with a dimensionless index of 0.32.  There is severe mitral annular calcification with moderate to severe mitral regurgitation with a mean gradient of 5  mmHg.  Left ventricular ejection fraction is 60 to 65% with severe asymmetric left ventricular hypertrophy of the basal septal segment.  The LVOT peak gradient was 21 mmHg.  She is here today with her daughter and son.  She continues to deny any symptoms although she said she does not feel as well since her catheterization with fatigue with activity.  Her daughter notes that she developed a bad cough right after the catheterization that persisted.  She said that she felt like her energy level is decreased after the catheterization.  She has had no shortness of breath.  She denies dizziness and syncope.  She has had no peripheral edema.  She denies any chest pain or pressure.  Past Medical History:  Diagnosis Date   Aortic stenosis    Coronary artery disease    a. Prior LHC (07/24/03): LMCA heavily calcified without significant stenosis, LAD with heavy calcification proximally. 50-60% stenosis just distal to D2, LCx calcified proximally without significant stenosis, Dominant RCA with moderate proximal calcification and mild luminal irregularities throughout, LVEDP 12, LVEF 84%   Diastolic dysfunction without heart failure    Former tobacco use    Heart murmur    HOCM (hypertrophic obstructive cardiomyopathy) (HCC)    Mild AI (aortic insufficiency)    Osteoporosis, unspecified    Other and unspecified hyperlipidemia    Paroxysmal atrial flutter ([facility])    Pneumonia    Wears hearing aid    left    Past Surgical History:  Procedure Laterality Date   ATRIAL ABLATION SURGERY  2005   BREAST SURGERY  1982   Benign tumor removed RT   BUNIONECTOMY  04/07/2012   Procedure: BUNIONECTOMY;  Surgeon: Alta Corning, MD;  Location: [facility];  Service: Orthopedics;  Laterality: Left;  CHEVRON OSTEOTOMY LEFT FOOT    CARDIAC CATHETERIZATION  2005   GANGLION CYST EXCISION     left wrist   HERNIA REPAIR  1990   rt ing   RIGHT/LEFT HEART CATH AND CORONARY ANGIOGRAPHY N/A 04/22/2021    Procedure: RIGHT/LEFT HEART CATH AND CORONARY ANGIOGRAPHY;  Surgeon: Carrie Mocha, MD;  Location: [facility];  Service: Cardiovascular;  Laterality: N/A;   TONSILLECTOMY     TUBAL LIGATION      Family History  Problem Relation Age of Onset   Heart disease Mother 5   Congestive Heart Failure Mother    Dementia Mother    Hypertension Other    Valvular heart disease Father        Had valves replaced   Colon cancer Neg Hx    Rectal cancer Neg Hx     Social History   Socioeconomic History   Marital status: Divorced    Spouse name: Not on file   Number of children: 2   Years of education: Not on file   Highest education level: Not on file  Occupational History   Occupation: retired  Tobacco Use   Smoking status: Former    Types: Cigarettes    Quit date: 04/05/1999    Years since quitting: 22.0   Smokeless tobacco: Never   Tobacco comments:    Regular Exercise-No  Vaping Use   Vaping Use: Never used  Substance and Sexual Activity   Alcohol use: Yes    Alcohol/week: 7.0 standard drinks    Types: 7 Glasses of wine per week    Comment: 1 drink per week   Drug use: No   Sexual activity: Not on file  Other Topics Concern   Not on file  Social History Narrative   Not on file   Social Determinants of Health   Financial Resource Strain: Not on file  Food Insecurity: No Food Insecurity   Worried About Running Out of Food in the Last Year: Never true   Ran Out of Food in the Last Year: Never true  Transportation Needs: No Transportation Needs   Lack of Transportation (Medical): No   Lack of Transportation (Non-Medical): No  Physical Activity: Sufficiently Active   Days of Exercise per Week: 7 days   Minutes of Exercise per Session: 30 min  Stress: No Stress Concern Present   Feeling of Stress : Not at all  Social Connections: Moderately Integrated   Frequency of Communication with Friends and Family: More than three times a week   Frequency of Social  Gatherings with Friends and Family: Once a week   Attends Religious Services: More than 4 times per year   Active Member of [employer] or Organizations: Yes   Attends Music therapist: More than 4 times per year   Marital Status: Divorced  Human resources officer Violence: Not At Risk   Fear of Current or Ex-Partner: No   Emotionally Abused: No   Physically Abused: No   Sexually Abused: No    Prior to Admission medications   Medication Sig Start Date End Date Taking? Authorizing Provider  Cholecalciferol (VITAMIN D3) 1000 UNITS CAPS Take 1 capsule by mouth daily.   Yes [provider]  denosumab (PROLIA) 60 MG/ML SOSY injection Inject 60 mg into the skin every 6 (six) months.   Yes [provider]  diphenhydrAMINE (BENADRYL) 25 MG tablet Take 25 mg by mouth every 6 (six) hours as needed for allergies.   Yes [provider]  ELIQUIS 5 MG TABS tablet Take 1 tablet (5 mg total) by mouth 2 (two) times daily. 09/23/20  Yes Carrie Mocha, MD  metoprolol succinate (TOPROL-XL) 50 MG 24 hr tablet TAKE 1 TABLET (50 MG TOTAL) BY MOUTH DAILY. TAKE WITH OR IMMEDIATELY FOLLOWING A MEAL. 07/04/20  Yes Dorothy Spark, MD  rosuvastatin (CRESTOR) 20 MG tablet TAKE 1 TABLET BY MOUTH EVERY DAY 05/23/20  Yes Dorothy Spark, MD  vitamin B-12 (CYANOCOBALAMIN) 1000 MCG tablet Take 1,000 mcg by mouth daily.   Yes [provider]    Current Outpatient Medications  Medication Sig Dispense Refill   Cholecalciferol (VITAMIN D3) 1000 UNITS CAPS Take 1 capsule by mouth daily.     denosumab (PROLIA) 60 MG/ML SOSY injection Inject 60 mg into the skin every 6 (six) months.     diphenhydrAMINE (BENADRYL) 25 MG tablet Take 25 mg by mouth every 6 (six) hours as needed for allergies.     ELIQUIS 5 MG TABS tablet Take 1 tablet (5 mg total) by mouth 2 (two) times daily. 180 tablet 1   metoprolol succinate (TOPROL-XL) 50 MG 24 hr tablet TAKE 1 TABLET (50 MG TOTAL) BY MOUTH DAILY. TAKE  WITH OR IMMEDIATELY FOLLOWING A MEAL. 90 tablet 2   rosuvastatin (CRESTOR) 20 MG tablet TAKE 1 TABLET BY MOUTH EVERY DAY 90 tablet 3   vitamin B-12 (CYANOCOBALAMIN) 1000 MCG tablet Take 1,000 mcg by mouth daily.     No current facility-administered medications for this visit.    Allergies  Allergen Reactions   Codeine Nausea And Vomiting   Crestor [Rosuvastatin] Other (See Comments)    Crestor 40mg  fatigue and exhaustion--pt tolerates crestor 20mg    Latex Rash      Review of Systems:   General:  normal appetite, + decreased energy, no weight gain, no weight loss, no fever  Cardiac:  no chest pain with exertion, no chest pain at rest, no SOB with exertion, no resting SOB, no PND, no orthopnea, no palpitations, no arrhythmia, + atrial fibrillation, no LE edema, no dizzy spells, no syncope  Respiratory:  no shortness of breath, no home oxygen, no productive cough, + dry cough, no bronchitis, no wheezing, no hemoptysis, no asthma, no pain with inspiration or cough, no sleep apnea, no CPAP at night  GI:   no difficulty swallowing, no reflux, no frequent heartburn, no hiatal hernia, no abdominal pain, no constipation, no diarrhea, no hematochezia, no hematemesis, no melena  GU:   no dysuria,  no frequency, no urinary tract infection, no hematuria, no kidney stones, no kidney disease  Vascular:  no pain suggestive of claudication, no pain in feet, no leg cramps, no varicose veins, no DVT, no non-healing foot ulcer  Neuro:   no stroke, no TIA's, no seizures, no headaches, no temporary blindness one eye,  no slurred speech, no peripheral neuropathy, no chronic pain, no instability of gait, no memory/cognitive dysfunction  Musculoskeletal: no arthritis, no joint swelling, no myalgias, no difficulty walking, normal mobility   Skin:   no rash, no itching, no skin infections, no pressure sores or ulcerations  Psych:   no anxiety, no depression, no nervousness, no unusual recent stress  Eyes:  no  blurry vision, no floaters, no recent vision changes, + wears glasses or contacts  ENT:   no hearing loss, no loose or painful teeth, no dentures, last saw dentist 12/13/20  Hematologic:  no easy bruising, no abnormal bleeding, no clotting disorder, no frequent epistaxis  Endocrine:  no diabetes, does not check CBG's at home     Physical Exam:   BP (!) 142/79 (BP Location: Right Arm, Patient Position: Sitting)    Pulse 66    Resp 20    Ht 5\' 1"  (1.549 m)    Wt 126 lb (57.2 kg)    SpO2 94% Comment: RA   BMI 23.81 kg/m   General:  Elderly but  well-appearing  HEENT:  Unremarkable, NCAT, PERLA, EOM  Neck:   no JVD, no bruits, no adenopathy   Chest:   clear to auscultation, symmetrical breath sounds, no wheezes, no rhonchi   CV:   RRR, 3/6 systolic murmur RSB, no diastolic murmur  Abdomen:  soft, non-tender, no masses   Extremities:  warm, well-perfused, pulses palpable at ankle, no lower extremity edema  Rectal/GU  Deferred  Neuro:   Grossly non-focal and symmetrical throughout  Skin:   Clean and dry, no rashes, no breakdown  Diagnostic Tests:    ECHOCARDIOGRAM REPORT         Patient Name:   Pasha SPRINKLE Pankonin Date of Exam: 03/23/2021  Medical Rec #:  [id number]               Height:       61.0 in  Accession #:    [id number]              Weight:       127.0 lb  Date of Birth:  [birth year]                BSA:          1.557 m  Patient Age:    60 years                BP:           130/72 mmHg  Patient Gender: F                       HR:           80 bpm.  Exam Location:  [address]   Procedure: 2D Echo, 3D Echo, Cardiac Doppler, Color Doppler and Strain  Analysis   Indications:    I34.0 Mitral regurgitation     History:        Patient has prior history of Echocardiogram examinations.                  Diastolic CHF-HOCM, CAD, Aortic Valve Disease,  Arrythmias:SVT                  and Atrial Fibrillation; Risk Factors:Dyslipidemia.  Previous                  echo revealed  severe LVH, HOCM with SAM LVOT gradient 26  mmHg,                  severe AS and MR.     Sonographer:    Lenard Galloway BA, RDCS  Referring Phys: [place]     1. Left ventricular ejection fraction, by estimation, is 60 to 65%. The  left ventricle has normal function. The left  ventricle has no regional  wall motion abnormalities. There is severe asymmetric left ventricular  hypertrophy of the basal-septal  segment. LVOT obstruction with peak gradient 55mmHg. Left ventricular  diastolic parameters are consistent with Grade II diastolic dysfunction  (pseudonormalization). Elevated left atrial pressure.   2. Right ventricular systolic function is normal. The right ventricular  size is normal. There is normal pulmonary artery systolic pressure. The  estimated right ventricular systolic pressure is 57.2 mmHg.   3. Left atrial size was moderately dilated.   4. The mitral valve is degenerative. Moderate to severe mitral valve  regurgitation. Mild mitral stenosis. MG 54mmHg at 74bpm, MVA 1.6 cm^2 by  continuity equation. Severe mitral annular calcification.   5. The inferior vena cava is normal in size with greater than 50%  respiratory variability, suggesting right atrial pressure of 3 mmHg.   6. The aortic valve is tricuspid. There is severe calcifcation of the  aortic valve. Aortic valve regurgitation is moderate. Severe aortic valve  stenosis. Concomitant LVOT obstruction complicates evaluation of AS  severity, but appears severe. Vmax 4.2  m/s, MG 46 mmHg, AVA 0.7 cm^2, DI 0.32   FINDINGS   Left Ventricle: Left ventricular ejection fraction, by estimation, is 60  to 65%. The left ventricle has normal function. The left ventricle has no  regional wall motion abnormalities. The left ventricular internal cavity  size was normal in size. There is   severe asymmetric left ventricular hypertrophy of the basal-septal  segment. Left ventricular diastolic parameters  are consistent with Grade  II diastolic dysfunction (pseudonormalization). Elevated left atrial  pressure.   Right Ventricle: The right ventricular size is normal. No increase in  right ventricular wall thickness. Right ventricular systolic function is  normal. There is normal pulmonary artery systolic pressure. The tricuspid  regurgitant velocity is 2.80 m/s, and   with an assumed right atrial pressure of 3 mmHg, the estimated right  ventricular systolic pressure is 62.0 mmHg.   Left Atrium: Left atrial size was moderately dilated.   Right Atrium: Right atrial size was normal in size.   Pericardium: There is no evidence of pericardial effusion.   Mitral Valve: The mitral valve is degenerative in appearance. Severe  mitral annular calcification. Moderate to severe mitral valve  regurgitation. Mild mitral valve stenosis.   Tricuspid Valve: The tricuspid valve is normal in structure. Tricuspid  valve regurgitation is mild.   Aortic Valve: The aortic valve is tricuspid. There is severe calcifcation  of the aortic valve. Aortic valve regurgitation is moderate. Aortic  regurgitation PHT measures 337 msec. Severe aortic stenosis is present.  Aortic valve mean gradient measures 46.0   mmHg. Aortic valve peak gradient measures 69.6 mmHg. Aortic valve area,  by VTI measures 0.72 cm.   Pulmonic Valve: The pulmonic valve was not well visualized. Pulmonic valve  regurgitation is not visualized.   Aorta: The aortic root and ascending aorta are structurally normal, with  no evidence of dilitation.   Venous: The inferior vena cava is normal in size with greater than 50%  respiratory variability, suggesting right atrial pressure of 3 mmHg.   IAS/Shunts: The interatrial septum was not well visualized.      LEFT VENTRICLE  PLAX 2D  LVIDd:         4.20 cm   Diastology  LVIDs:         3.10 cm   LV e' medial:    3.92 cm/s  LV PW:  1.00 cm   LV E/e' medial:  30.9  LV IVS:         1.50 cm   LV e' lateral:   11.00 cm/s  LVOT diam:     1.70 cm   LV E/e' lateral: 11.0  LV SV:         76  LV SV Index:   49  LVOT Area:     2.27 cm      RIGHT VENTRICLE             IVC  RV Basal diam:  3.60 cm     IVC diam: 1.20 cm  RV Mid diam:    1.90 cm  RV S prime:     12.20 cm/s  TAPSE (M-mode): 1.7 cm  RVSP:           34.4 mmHg   LEFT ATRIUM             Index        RIGHT ATRIUM           Index  LA diam:        4.10 cm 2.63 cm/m   RA Pressure: 3.00 mmHg  LA Vol (A2C):   68.8 ml 44.18 ml/m  RA Area:     9.51 cm  LA Vol (A4C):   66.8 ml 42.90 ml/m  RA Volume:   17.80 ml  11.43 ml/m  LA Biplane Vol: 68.3 ml 43.86 ml/m   AORTIC VALVE  AV Area (Vmax):    0.86 cm  AV Area (Vmean):   0.78 cm  AV Area (VTI):     0.72 cm  AV Vmax:           417.00 cm/s  AV Vmean:          318.333 cm/s  AV VTI:            1.060 m  AV Peak Grad:      69.6 mmHg  AV Mean Grad:      46.0 mmHg  LVOT Vmax:         158.00 cm/s  LVOT Vmean:        110.000 cm/s  LVOT VTI:          0.335 m  LVOT/AV VTI ratio: 0.32  AI PHT:            337 msec     AORTA  Ao Root diam: 2.90 cm  Ao Asc diam:  3.20 cm   MITRAL VALVE                  TRICUSPID VALVE  MV Area (PHT): 3.77 cm       TR Peak grad:   31.4 mmHg  MV Area VTI:   0.03 cm       TR Vmax:        280.00 cm/s  MV VTI:        22.80 m        Estimated RAP:  3.00 mmHg  MV Decel Time: 201 msec       RVSP:           34.4 mmHg  MR Peak grad:    182.2 mmHg  MR Mean grad:    119.0 mmHg   SHUNTS  MR Vmax:         675.00 cm/s  Systemic VTI:  0.34 m  MR Vmean:        517.0 cm/s   Systemic Diam: 1.70  cm  MR PISA Nyquist: 2.8 m/s  MR PISA:         9.05 cm  MR PISA Eff ROA: 37 mm  MR PISA Radius:  1.20 cm  MV E velocity: 121.00 cm/s  MV A velocity: 125.00 cm/s  MV E/A ratio:  0.97   Oswaldo Milian MD  Electronically signed by Oswaldo Milian MD  Signature Date/Time: 03/23/2021/11:47:38 AM         Final     Physicians  Panel  Physicians Referring Physician Case Authorizing Physician  Carrie Mocha, MD (Primary)     Procedures  RIGHT/LEFT HEART CATH AND CORONARY ANGIOGRAPHY   Conclusion      Mid RCA lesion is 50% stenosed.   Hemodynamic findings consistent with mild pulmonary hypertension.   There is severe aortic valve stenosis.   1.  Calcified but nonobstructive coronary artery disease with patency of the left main, LAD, left circumflex, ramus intermedius, and right coronary arteries 2.  Heavy mitral annular calcification with known moderate to severe mitral regurgitation and mild mitral stenosis by echo assessment 3.  Severe aortic stenosis with mean transvalvular gradient 27 mmHg and calculated aortic valve area 0.97 cm (probable combination of valvular and subvalvular stenosis) 4.  Mild pulmonary hypertension with mean pulmonary artery pressure 28 mmHg, transpulmonary gradient 9 mmHg, V wave 24 mmHg.   Continued evaluation of valvular heart disease with consideration of cardiac surgery   Procedural Details  Technical Details INDICATION: 76 year old woman with complex cardiac history of aortic stenosis, mitral regurgitation, subvalvular stenosis who presents for diagnostic right and left heart catheterization.  PROCEDURAL DETAILS: Using direct ultrasound guidance, the basilic vein is accessed via a front wall puncture.  Ultrasound images are digitally captured and stored in the patient's chart.  A 4/5 French slender sheath is inserted.. The right wrist was then prepped, draped, and anesthetized with 1% lidocaine. Using ultrasound guidance a 5/6 French Slender sheath was placed in the right radial artery. Intra-arterial verapamil was administered through the radial artery sheath. IV heparin was administered after a JR4 catheter was advanced into the central aorta. A Swan-Ganz catheter was used for the right heart catheterization. Standard protocol was followed for recording of right heart pressures and  sampling of oxygen saturations. Fick cardiac output was calculated.  There was a radial artery loop in the right arm.  I was able to traverse the loop with an angled Glidewire but a catheter cause severe pain and the radial approach was thus abandoned.  The femoral area had been prepped.  Using direct ultrasound guidance, the right femoral artery is accessed via a front wall puncture.  Ultrasound images are digitally captured and stored in the patient's chart.  A 5 French sheath is inserted.  Standard Judkins catheters were used for selective coronary angiography. LV pressure is recorded and an aortic valve pullback is performed. There were no immediate procedural complications.  Mynx closure is used for femoral artery hemostasis.  A TR band is used for radial artery hemostasis.  Manual pressure will be used for antecubital venous hemostasis.  The patient was transferred to the post catheterization recovery area for further monitoring.      Estimated blood loss <50 mL.   During this procedure medications were administered to achieve and maintain moderate conscious sedation while the patient's heart rate, blood pressure, and oxygen saturation were continuously monitored and I was present face-to-face 100% of this time.   Medications (Filter: Administrations occurring from 0802 to 0919 on 04/22/21)  important  Continuous medications are totaled by the amount administered until 04/22/21 0919.   Heparin (Porcine) in NaCl 1000-0.9 UT/500ML-% SOLN (mL) Total volume:  1,000 mL Date/Time Rate/Dose/Volume Action   04/22/21 0808 500 mL Given   0808 500 mL Given    midazolam (VERSED) injection (mg) Total dose:  2 mg Date/Time Rate/Dose/Volume Action   04/22/21 0826 2 mg Given    fentaNYL (SUBLIMAZE) injection (mcg) Total dose:  25 mcg Date/Time Rate/Dose/Volume Action   04/22/21 0826 25 mcg Given    lidocaine (PF) (XYLOCAINE) 1 % injection (mL) Total volume:  19 mL Date/Time Rate/Dose/Volume  Action   04/22/21 0833 2 mL Given   0835 2 mL Given   0850 15 mL Given    Radial Cocktail/Verapamil only (mL) Total volume:  10 mL Date/Time Rate/Dose/Volume Action   04/22/21 0837 10 mL Given    iohexol (OMNIPAQUE) 350 MG/ML injection (mL) Total volume:  40 mL Date/Time Rate/Dose/Volume Action   04/22/21 0912 40 mL Given    Sedation Time  Sedation Time Physician-1: 39 minutes 46 seconds Contrast  Medication Name Total Dose  iohexol (OMNIPAQUE) 350 MG/ML injection 40 mL   Radiation/Fluoro  Fluoro time: 4.8 (min) DAP: 9339 (mGycm2) Cumulative Air Kerma: 948 (mGy) Complications  Complications documented before study signed (04/22/2021  5:46 AM)   No complications were associated with this study.  Documented by Marton Redwood - 04/22/2021  9:13 AM     Coronary Findings  Diagnostic Dominance: Right Left Main  There is mild diffuse disease throughout the vessel. Calcified but widely patent with no significant stenosis    Left Anterior Descending  There is mild diffuse disease throughout the vessel. The vessel is moderately calcified. The LAD is calcified throughout its proximal and mid segments. The proximal vessel is patent with no stenosis. The first diagonal is patent with very mild ostial narrowing. The diagonal is a large caliber vessel. The mid and distal LAD have mild diffuse irregularities with no significant stenoses    Ramus Intermedius  There is mild diffuse disease throughout the vessel. The intermediate branch is patent with mild proximal irregularity and nonobstructive stenosis    Left Circumflex  The vessel exhibits minimal luminal irregularities. The circumflex is patent. The first obtuse marginal branch is patent with minimal irregularity.    Right Coronary Artery  There is mild diffuse disease throughout the vessel. Dominant vessel with diffuse calcification. The proximal vessel is widely patent. The mid vessel has diffuse irregular 40 to 50%  stenosis. The distal vessel has mild irregularity with no significant stenosis. The PDA and PLA branches are patent without significant stenoses.  Mid RCA lesion is 50% stenosed. The lesion is moderately calcified.    Intervention   No interventions have been documented.   Right Heart  Right Heart Pressures Hemodynamic findings consistent with mild pulmonary hypertension.   Left Heart  Mitral Valve The annulus is calcified.  Aortic Valve There is severe aortic valve stenosis. The aortic valve is calcified. There is restricted aortic valve motion.   Coronary Diagrams  Diagnostic Dominance: Right Intervention  Implants     Vascular Products  Closure Mynx Control 22f - [id number] - Implanted Inventory item: CLOSURE [facility] CONTROL 77F Model/Cat number: [id number]  Manufacturer: [place] Lot number: [id number]  Device identifier: [id number] Device identifier type: GS1  GUDID Information  Request status Successful    Brand name: [facility] CONTROL Version/Model: [id number]  Company name: [employer] safety info as of 04/22/21:  MR Safe  Contains dry or latex rubber: No    GMDN P.T. name: Wound hydrogel dressing, non-antimicrobial     As of 04/22/2021  Status: Implanted       [employer] Images   Show images for CARDIAC CATHETERIZATION Images on [employer]   Show images for Rennie, Hack Sprinkle Link to Procedure Log  Procedure Log    Hemo Data  Flowsheet Row Most Recent Value  Fick Cardiac Output 4.86 L/min  Fick Cardiac Output Index 3.13 (L/min)/BSA  Aortic Mean Gradient 26.63 mmHg  Aortic Peak Gradient 10 mmHg  Aortic Valve Area 0.97  Aortic Value Area Index 0.62 cm2/BSA  RA A Wave 13 mmHg  RA V Wave 9 mmHg  RA Mean 9 mmHg  RV Systolic Pressure 37 mmHg  RV Diastolic Pressure 4 mmHg  RV EDP 10 mmHg  PA Systolic Pressure 41 mmHg  PA Diastolic Pressure 15 mmHg  PA Mean 28 mmHg  PW A Wave 24 mmHg  PW V Wave 24 mmHg  PW Mean 19 mmHg  AO  Systolic Pressure 829 mmHg  AO Diastolic Pressure 52 mmHg  AO Mean 80 mmHg  LV Systolic Pressure 937 mmHg  LV Diastolic Pressure 16 mmHg  LV EDP 20 mmHg  AOp Systolic Pressure 169 mmHg  AOp Diastolic Pressure 58 mmHg  AOp Mean Pressure 89 mmHg  LVp Systolic Pressure 678 mmHg  LVp Diastolic Pressure 14 mmHg  LVp EDP Pressure 21 mmHg  QP/QS 1  TPVR Index 8.94 HRUI  TSVR Index 25.52 HRUI  PVR SVR Ratio 0.13  TPVR/TSVR Ratio 0.35    ADDENDUM REPORT: 04/13/2021 14:22   CLINICAL DATA:  Aortic Valve pathology with assessment for TAVR   EXAM: Cardiac TAVR CT   TECHNIQUE: The patient was scanned on a Siemens Force 938 slice scanner. A 120 kV retrospective scan was triggered in the descending thoracic aorta at 111 HU's. Gantry rotation speed was 270 msecs and collimation was .9 mm. No beta blockade or nitro were given. The 3D data set was reconstructed in 5% intervals of the R-R cycle. Systolic and diastolic phases were analyzed on a dedicated work station using MPR, MIP and VRT modes. The patient received 95 cc of contrast.   FINDINGS: Aortic Valve: Severely thickened aortic valve with heavy calcification and reduced excursion the planimeter valve area is 0.84 Sq cm consistent with severe aortic stenosis   Number of leaflets: 3   LVOT calcification: none   Annular calcification: none   Aortic Valve Calcium Score: 3195   Presence of basal septal hypertrophy: 16 mm   - Systolic annulus assessment is still greater than diastolic assessment   Perimembranous septal diameter: 7 mm   Mitral Valve: There is severe mitral annular calcification   Aortic Annulus Measurements- 10% phase assessment   Major annulus diameter: 24 mm   Minor annulus diameter: 18 mm   Annular perimeter: 66 mm   Annular area: 3.3 cm2   Aortic Root Measurements   Sinotubular Junction: 28 mm   Ascending Thoracic Aorta: 35 mm   Aortic Arch: 29 mm   Descending Thoracic Aorta: 21 mm    Sinus of Valsalva Measurements:   Right coronary cusp width: 27 mm   Left coronary cusp width: 28 mm   Non coronary cusp width: 28 mm   Mean diameter: 28 mm   Coronary Artery Height above Annulus:   Left Main: 10 mm   Left SoV height: 16 mm   Right Coronary: 12 mm  Right SoV height: 16 mm   Optimum Fluoroscopic Angle for Delivery: LAO 3, CAU 7   Cusp overlay view angle:RAO 14, CAU 25   Valves for structural team consideration: Valve is at the lower limits for a 23 mm Sapien Valve vs upper limites for a 20 mm Sapien Valve. Coronary heights listed as above; there is sufficiency SoV diameter and height for a 26 mm CoreValve (lower limits of normal).   Non TAVR Valve Findings:   Coronary Calcium Score:   Left main: 398   Left anterior descending artery: 1165   Left circumflex artery: 40   Right coronary artery: 1524   Ramus Intermedius: 213   Total: 3340   Percentile: 99th for age, sex, and race matched control.   Aortic atherosclerosis noted.   Vascular air seen in the main pulmonary artery.   No left atrial appendage.   IMPRESSION: 1. Severe Aortic stenosis. Findings pertinent to TAVR procedure are detailed above.   2. Patient's total coronary artery calcium score is 3340, which is 99th percentile for subjects of the same age, gender, and race based populations.   3.  Aortic atherosclerosis.   RECOMMENDATIONS:   Coronary artery calcium (CAC) score is a strong predictor of incident coronary heart disease (CHD) and provides predictive information beyond traditional risk factors. CAC scoring is reasonable to use in the decision to withhold, postpone, or initiate statin therapy in intermediate-risk or selected borderline-risk asymptomatic adults (age 61-75 years and LDL-C >=70 to <190 mg/dL) who do not have diabetes or established atherosclerotic cardiovascular disease (ASCVD).* In intermediate-risk (10-year ASCVD risk >=7.5% to <20%) adults or  selected borderline-risk (10-year ASCVD risk >=5% to <7.5%) adults in whom a CAC score is measured for the purpose of making a treatment decision the following recommendations have been made:   If CAC = 0, it is reasonable to withhold statin therapy and reassess in 5 to 10 years, as long as higher risk conditions are absent (diabetes mellitus, family history of premature CHD in first degree relatives (males <55 years; females <65 years), cigarette smoking, LDL >=190 mg/dL or other independent risk factors).   If CAC is 1 to 99, it is reasonable to initiate statin therapy for patients >=71 years of age.   If CAC is >=100 or >=75th percentile, it is reasonable to initiate statin therapy at any age.   Cardiology referral should be considered for patients with CAC scores >=400 or >=75th percentile.   *2018 AHA/ACC/AACVPR/AAPA/ABC/ACPM/ADA/AGS/APhA/ASPC/NLA/PCNA Guideline on the Management of Blood Cholesterol: A Report of the American College of Cardiology/American Heart Association Task Force on Clinical Practice Guidelines. J Am Coll Cardiol. 2019;73(24):3168-3209.   Mahesh  Chandrasekhar     Electronically Signed   By: Rudean Haskell M.D.   On: 04/13/2021 14:22    Addended by Werner Lean, MD on 04/13/2021  2:25 PM   Study Result  Narrative & Impression  EXAM: OVER-READ INTERPRETATION  CT CHEST   The following report is an over-read performed by radiologist Dr. Salvatore Marvel of [facility], [place] on 04/13/2021. This over-read does not include interpretation of cardiac or coronary anatomy or pathology. The coronary CTA interpretation by the cardiologist is attached.   COMPARISON:  04/27/2017 chest CT angiogram.   FINDINGS: Please see the separate concurrent chest CT angiogram report for details.   IMPRESSION: Please see the separate concurrent chest CT angiogram report for details.   Electronically Signed: By: Ilona Sorrel M.D. On:  04/13/2021 13:17    Narrative &  Impression  CLINICAL DATA:  Aortic valve replacement (TAVR), pre-op eval. Aortic stenosis.   EXAM: CT ANGIOGRAPHY CHEST, ABDOMEN AND PELVIS   TECHNIQUE: Multidetector CT imaging through the chest, abdomen and pelvis was performed using the standard protocol during bolus administration of intravenous contrast. Multiplanar reconstructed images and MIPs were obtained and reviewed to evaluate the vascular anatomy.   CONTRAST:  61mL OMNIPAQUE IOHEXOL 350 MG/ML SOLN   COMPARISON:  04/27/2017 chest CT angiogram.   FINDINGS: CTA CHEST FINDINGS   Cardiovascular: Mild cardiomegaly. No significant pericardial effusion/thickening. Diffuse thickening and calcification of the aortic valve. Atherosclerotic nonaneurysmal thoracic aorta. Normal caliber pulmonary arteries. No central pulmonary emboli.   Mediastinum/Nodes: Heterogeneous partially calcified 1.2 cm upper left thyroid nodule, stable. Unremarkable esophagus. No pathologically enlarged axillary, mediastinal or hilar lymph nodes.   Lungs/Pleura: No pneumothorax. No pleural effusion. No acute consolidative airspace disease, lung masses or significant pulmonary nodules. Mild centrilobular emphysema.   Musculoskeletal: No aggressive appearing focal osseous lesions. Chronic severe T5 and T8 and mild T11 vertebral compression fractures, unchanged. Moderate thoracic spondylosis.   CTA ABDOMEN AND PELVIS FINDINGS   Hepatobiliary: Normal liver with no liver mass. Normal gallbladder with no radiopaque cholelithiasis. No biliary ductal dilatation.   Pancreas: Normal, with no mass or duct dilation.   Spleen: Normal size. No mass.   Adrenals/Urinary Tract: Normal adrenals. No contour deforming renal masses. No hydronephrosis. Normal bladder.   Stomach/Bowel: Normal non-distended stomach. Normal caliber small bowel with no small bowel wall thickening. Normal appendix. Marked colonic diverticulosis,  most prominent in the sigmoid colon, with no large bowel wall thickening or significant pericolonic fat stranding.   Vascular/Lymphatic: Atherosclerotic nonaneurysmal abdominal aorta. No pathologically enlarged lymph nodes in the abdomen or pelvis.   Reproductive: Grossly normal uterus.  No adnexal mass.   Other: No pneumoperitoneum, ascites or focal fluid collection.   Musculoskeletal: No aggressive appearing focal osseous lesions. Chronic moderate to severe vertebral compression fractures throughout the lumbar vertebral bodies, most prominent at L5, unchanged since 04/21/2017 CT. Marked lumbar degenerative disc disease, most prominent at L5-S1. Diffuse osteopenia.   VASCULAR MEASUREMENTS PERTINENT TO TAVR:   AORTA:   Minimal Aortic Diameter-10.2 x 9.5 mm   Severity of Aortic Calcification-severe   RIGHT PELVIS:   Right Common Iliac Artery -   Minimal Diameter-6.2 x 6.0 mm   Tortuosity-moderate   Calcification-severe   Right External Iliac Artery -   Minimal Diameter-6.2 x 6.2 mm   Tortuosity-moderate to severe   Calcification-mild   Right Common Femoral Artery -   Minimal Diameter-6.0 x 5.1 mm   Tortuosity-mild   Calcification-moderate   LEFT PELVIS:   Left Common Iliac Artery -   Minimal Diameter-6.7 x 6.3 mm   Tortuosity-moderate to severe   Calcification-severe   Left External Iliac Artery -   Minimal Diameter-6.6 x 6.4 mm   Tortuosity-moderate   Calcification-mild   Left Common Femoral Artery -   Minimal Diameter-6.3 x 5.5 mm   Tortuosity-mild   Calcification-moderate   Review of the MIP images confirms the above findings.   IMPRESSION: 1. Vascular findings and measurements pertinent to potential TAVR procedure, as detailed. 2. Diffuse thickening and calcification of the aortic valve, compatible with reported aortic stenosis. 3. Mild cardiomegaly. 4. Marked colonic diverticulosis. 5. Diffuse osteopenia. Chronic multilevel  vertebral compression fractures throughout the thoracic and lumbar spine. 6. Aortic Atherosclerosis (ICD10-I70.0) and Emphysema (ICD10-J43.9).     Electronically Signed   By: Ilona Sorrel M.D.   On: 04/13/2021 13:58  STS PROM: 3.211%  Impression:  This 76 year old woman has stage D, severe, symptomatic aortic stenosis with New York Heart Association class II symptoms of exertional fatigue.  She developed a significant cough after her catheterization which may have been due to some congestive heart failure.  I have personally reviewed her 2D echocardiogram, cardiac catheterization, and CTA studies.  Her echocardiogram shows a severely calcified and thickened aortic valve with restricted leaflet mobility.  The mean gradient was 46 mmHg with a valve area of 0.7 cm consistent with severe aortic stenosis.  There is also moderate aortic insufficiency and moderate to severe mitral regurgitation with a degenerative and heavily calcified mitral valve with severe mitral annular calcification.  There is mild mitral stenosi.  Left ventricular systolic function is normal with severe asymmetric hypertrophy of the septal segment.  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  I agree that aortic valve replacement is indicated in this patient for relief of her symptoms and to prevent progressive left ventricular deterioration. 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  Her CTA of the chest shows extensive calcification of the ascending aorta and aortic arch and therefore I do not think she is a candidate for open surgical double valve replacement and septal myectomy.  I think the best option for treating her would be to perform TAVR with continued medical treatment of her mitral valve  regurgitation.  Hopefully this would improve with treatment of her aortic stenosis.  Her gated cardiac CTA shows anatomy suitable for TAVR although she has a relatively small annulus.  She is at the lower limit for a 23 mm SAPIEN 3 valve and would be 23% oversized which would not be ideal with her calcified aorta.  She appears to be a candidate for a 26 mm Medtronic Evolut valve.  Her abdominal and pelvic CTA shows adequate pelvic vascular anatomy to allow transfemoral insertion.  The patient and her son and daughter were counseled at length regarding treatment alternatives for management of severe symptomatic aortic stenosis. The risks and benefits of surgical intervention has been discussed in detail. Long-term prognosis with medical therapy was discussed. Alternative approaches such as conventional surgical aortic valve replacement, transcatheter aortic valve replacement, and palliative medical therapy were compared and contrasted at length. This discussion was placed in the context of the patient's own specific clinical presentation and past medical history. All of their questions have been addressed.   Following the decision to proceed with transcatheter aortic valve replacement, a discussion was held regarding what types of management strategies would be attempted intraoperatively in the event of life-threatening complications, including whether or not the patient would be considered a candidate for the use of cardiopulmonary bypass and/or conversion to open sternotomy for attempted surgical intervention.  I do not think she would be a candidate for emergent sternotomy to manage any intraoperative complications given her calcified aorta. The patient is aware of the fact that transient use of cardiopulmonary bypass may be necessary. The patient has been advised of a variety of complications that might develop including but not limited to risks of death, stroke, paravalvular leak, aortic dissection or other  major vascular complications, aortic annulus rupture, device embolization, cardiac rupture or perforation, mitral regurgitation, acute myocardial infarction, arrhythmia, heart block or bradycardia requiring permanent pacemaker placement, congestive heart failure, respiratory failure, renal failure,  pneumonia, infection, other late complications related to structural valve deterioration or migration, or other complications that might ultimately cause a temporary or permanent loss of functional independence or other long term morbidity. The patient provides full informed consent for the procedure as described and all questions were answered.   Plan:  She will tentatively be scheduled for transfemoral TAVR on 05/19/2021.  I spent 60 minutes performing this consultation and > 50% of this time was spent face to face counseling and coordinating the care of this patient's severe symptomatic aortic stenosis.   Gaye Pollack, MD 05/04/2021 4:09 PM

## 2021-05-11 ENCOUNTER — Other Ambulatory Visit: Payer: Self-pay

## 2021-05-11 DIAGNOSIS — I2510 Atherosclerotic heart disease of native coronary artery without angina pectoris: Secondary | ICD-10-CM

## 2021-05-11 MED ORDER — ROSUVASTATIN CALCIUM 20 MG PO TABS: 20.0000 mg | ORAL_TABLET | Freq: Every day | ORAL | 3 refills | Status: DC

## 2021-05-11 MED ORDER — METOPROLOL SUCCINATE ER 50 MG PO TB24: 50.0000 mg | ORAL_TABLET | Freq: Every day | ORAL | 3 refills | Status: DC

## 2021-05-12 ENCOUNTER — Encounter: Payer: Self-pay | Admitting: Cardiovascular Disease

## 2021-05-12 ENCOUNTER — Other Ambulatory Visit: Payer: Self-pay

## 2021-05-12 ENCOUNTER — Ambulatory Visit: Payer: Medicare HMO | Admitting: Cardiovascular Disease

## 2021-05-12 VITALS — BP 130/70 | HR 69 | Ht 61.0 in | Wt 122.4 lb

## 2021-05-12 DIAGNOSIS — I350 Nonrheumatic aortic (valve) stenosis: Secondary | ICD-10-CM | POA: Diagnosis not present

## 2021-05-12 NOTE — Patient Instructions (Addendum)
Medication Instructions:  No changes *If you need a refill on your cardiac medications before your next appointment, please call your pharmacy*   Lab Work: none If you have labs (blood work) drawn today and your tests are completely normal, you will receive your results only by: [place] (if you have MyChart) OR A paper copy in the mail If you have any lab test that is abnormal or we need to change your treatment, we will call you to review the results.   Testing/Procedures: Please plan for echo and same day office visit with Dr. Burt Knack in May 2023.     Follow-Up:  Friday Sep 04, 2021 - echo at 1:05 pm (arrive by 12:50 pm) and visit with Dr. Burt Knack afterward at 2:00 pm   Addendum:  called and left VM for patient of her appointments Sep 04, 2021.

## 2021-05-12 NOTE — Progress Notes (Signed)
Cardiology Office Note:    Date:  05/13/2021   ID:  Carrie, [name] 1945-10-08, MRN [id number]  PCP:  [name], MD   [facility] Providers Cardiologist:  [name], MD     Referring MD: [name], MD   Chief Complaint  Patient presents with   Aortic Stenosis    History of Present Illness:    Carrie [name] is a 76 y.o. female returns for discussion regarding her aortic stenosis.  She underwent formal cardiac surgical consultation with Dr. Cyndia Bent May 04, 2021.  We subsequently discussed her case in our multidisciplinary team meeting.  The patient has paroxysmal atrial fibrillation, severe aortic stenosis, hypertrophic cardiomyopathy with subvalvular stenosis, and severe mitral annular calcification with at least moderate mitral regurgitation.  She was deemed a poor candidate for conventional heart surgery because of extensive calcification in the ascending aorta and aortic arch as well as the need for complex surgery which would involve double valve replacement and septal myectomy.  However, the patient is felt to be a potential candidate for TAVR.  She is here with her daughter today.  She had a cough and mild shortness of breath following her cardiac catheterization.  All of this is now resolved.  She currently has no shortness of breath, chest pain, chest pressure, lightheadedness, or heart palpitations.  She has no complaints today.  Past Medical History:  Diagnosis Date   Aortic stenosis    Coronary artery disease    a. Prior LHC (07/24/03): LMCA heavily calcified without significant stenosis, LAD with heavy calcification proximally. 50-60% stenosis just distal to D2, LCx calcified proximally without significant stenosis, Dominant RCA with moderate proximal calcification and mild luminal irregularities throughout, LVEDP 12, LVEF 01%   Diastolic dysfunction without heart failure    Former tobacco use    Heart murmur    HOCM (hypertrophic  obstructive cardiomyopathy) (HCC)    Mild AI (aortic insufficiency)    Osteoporosis, unspecified    Other and unspecified hyperlipidemia    Paroxysmal atrial flutter ([place])    Pneumonia    Wears hearing aid    left    Past Surgical History:  Procedure Laterality Date   ATRIAL ABLATION SURGERY  2005   BREAST SURGERY  1982   Benign tumor removed RT   BUNIONECTOMY  04/07/2012   Procedure: BUNIONECTOMY;  Surgeon: Alta Corning, MD;  Location: [facility];  Service: Orthopedics;  Laterality: Left;  CHEVRON OSTEOTOMY LEFT FOOT    CARDIAC CATHETERIZATION  2005   GANGLION CYST EXCISION     left wrist   HERNIA REPAIR  1990   rt ing   RIGHT/LEFT HEART CATH AND CORONARY ANGIOGRAPHY N/A 04/22/2021   Procedure: RIGHT/LEFT HEART CATH AND CORONARY ANGIOGRAPHY;  Surgeon: [name], MD;  Location: [facility];  Service: Cardiovascular;  Laterality: N/A;   TONSILLECTOMY     TUBAL LIGATION      Current Medications: Current Meds  Medication Sig   Cholecalciferol (VITAMIN D3) 1000 UNITS CAPS Take 1 capsule by mouth daily.   denosumab (PROLIA) 60 MG/ML SOSY injection Inject 60 mg into the skin every 6 (six) months.   diphenhydrAMINE (BENADRYL) 25 MG tablet Take 25 mg by mouth every 6 (six) hours as needed for allergies.   ELIQUIS 5 MG TABS tablet Take 1 tablet (5 mg total) by mouth 2 (two) times daily.   metoprolol succinate (TOPROL-XL) 50 MG 24 hr tablet Take 1 tablet (50 mg total) by  mouth daily. Take with or immediately following a meal.   rosuvastatin (CRESTOR) 20 MG tablet Take 1 tablet (20 mg total) by mouth daily.   vitamin B-12 (CYANOCOBALAMIN) 1000 MCG tablet Take 1,000 mcg by mouth daily.     Allergies:   Codeine, Crestor [rosuvastatin], and Latex   Social History   Socioeconomic History   Marital status: Divorced    Spouse name: Not on file   Number of children: 2   Years of education: Not on file   Highest education level: Not on file  Occupational  History   Occupation: retired  Tobacco Use   Smoking status: Former    Types: Cigarettes    Quit date: 04/05/1999    Years since quitting: 22.1   Smokeless tobacco: Never   Tobacco comments:    Regular Exercise-No  Vaping Use   Vaping Use: Never used  Substance and Sexual Activity   Alcohol use: Yes    Alcohol/week: 7.0 standard drinks    Types: 7 Glasses of wine per week    Comment: 1 drink per week   Drug use: No   Sexual activity: Not on file  Other Topics Concern   Not on file  Social History Narrative   Not on file   Social Determinants of Health   Financial Resource Strain: Not on file  Food Insecurity: No Food Insecurity   Worried About Running Out of Food in the Last Year: Never true   Ran Out of Food in the Last Year: Never true  Transportation Needs: No Transportation Needs   Lack of Transportation (Medical): No   Lack of Transportation (Non-Medical): No  Physical Activity: Sufficiently Active   Days of Exercise per Week: 7 days   Minutes of Exercise per Session: 30 min  Stress: No Stress Concern Present   Feeling of Stress : Not at all  Social Connections: Moderately Integrated   Frequency of Communication with Friends and Family: More than three times a week   Frequency of Social Gatherings with Friends and Family: Once a week   Attends Religious Services: More than 4 times per year   Active Member of [employer] or Organizations: Yes   Attends Music therapist: More than 4 times per year   Marital Status: Divorced     Family History: The patient's family history includes Congestive Heart Failure in her mother; Dementia in her mother; Heart disease (age of onset: 14) in her mother; Hypertension in an other family member; Valvular heart disease in her father. There is no history of Colon cancer or Rectal cancer.  ROS:   Please see the history of present illness.    All other systems reviewed and are negative.  EKGs/Labs/Other Studies Reviewed:     The following studies were reviewed today: Echo 03/23/21: 1. Left ventricular ejection fraction, by estimation, is 60 to 65%. The  left ventricle has normal function. The left ventricle has no regional  wall motion abnormalities. There is severe asymmetric left ventricular  hypertrophy of the basal-septal  segment. LVOT obstruction with peak gradient 37mmHg. Left ventricular  diastolic parameters are consistent with Grade II diastolic dysfunction  (pseudonormalization). Elevated left atrial pressure.   2. Right ventricular systolic function is normal. The right ventricular  size is normal. There is normal pulmonary artery systolic pressure. The  estimated right ventricular systolic pressure is 29.5 mmHg.   3. Left atrial size was moderately dilated.   4. The mitral valve is degenerative. Moderate to severe mitral valve  regurgitation. Mild mitral stenosis. MG 80mmHg at 74bpm, MVA 1.6 cm^2 by  continuity equation. Severe mitral annular calcification.   5. The inferior vena cava is normal in size with greater than 50%  respiratory variability, suggesting right atrial pressure of 3 mmHg.   6. The aortic valve is tricuspid. There is severe calcifcation of the  aortic valve. Aortic valve regurgitation is moderate. Severe aortic valve  stenosis. Concomitant LVOT obstruction complicates evaluation of AS  severity, but appears severe. Vmax 4.2  m/s, MG 46 mmHg, AVA 0.7 cm^2, DI 0.32   FINDINGS   Left Ventricle: Left ventricular ejection fraction, by estimation, is 60  to 65%. The left ventricle has normal function. The left ventricle has no  regional wall motion abnormalities. The left ventricular internal cavity  size was normal in size. There is   severe asymmetric left ventricular hypertrophy of the basal-septal  segment. Left ventricular diastolic parameters are consistent with Grade  II diastolic dysfunction (pseudonormalization). Elevated left atrial  pressure.   Right Ventricle:  The right ventricular size is normal. No increase in  right ventricular wall thickness. Right ventricular systolic function is  normal. There is normal pulmonary artery systolic pressure. The tricuspid  regurgitant velocity is 2.80 m/s, and   with an assumed right atrial pressure of 3 mmHg, the estimated right  ventricular systolic pressure is 31.4 mmHg.   Left Atrium: Left atrial size was moderately dilated.   Right Atrium: Right atrial size was normal in size.   Pericardium: There is no evidence of pericardial effusion.   Mitral Valve: The mitral valve is degenerative in appearance. Severe  mitral annular calcification. Moderate to severe mitral valve  regurgitation. Mild mitral valve stenosis.   Tricuspid Valve: The tricuspid valve is normal in structure. Tricuspid  valve regurgitation is mild.   Aortic Valve: The aortic valve is tricuspid. There is severe calcifcation  of the aortic valve. Aortic valve regurgitation is moderate. Aortic  regurgitation PHT measures 337 msec. Severe aortic stenosis is present.  Aortic valve mean gradient measures 46.0   mmHg. Aortic valve peak gradient measures 69.6 mmHg. Aortic valve area,  by VTI measures 0.72 cm.   Pulmonic Valve: The pulmonic valve was not well visualized. Pulmonic valve  regurgitation is not visualized.   Aorta: The aortic root and ascending aorta are structurally normal, with  no evidence of dilitation.   Venous: The inferior vena cava is normal in size with greater than 50%  respiratory variability, suggesting right atrial pressure of 3 mmHg.   IAS/Shunts: The interatrial septum was not well visualized.   Cardiac CTA: Aortic Valve: Severely thickened aortic valve with heavy calcification and reduced excursion the planimeter valve area is 0.84 Sq cm consistent with severe aortic stenosis   Number of leaflets: 3   LVOT calcification: none   Annular calcification: none   Aortic Valve Calcium Score: 3195    Presence of basal septal hypertrophy: 16 mm   - Systolic annulus assessment is still greater than diastolic assessment   Perimembranous septal diameter: 7 mm   Mitral Valve: There is severe mitral annular calcification   Aortic Annulus Measurements- 10% phase assessment   Major annulus diameter: 24 mm   Minor annulus diameter: 18 mm   Annular perimeter: 66 mm   Annular area: 3.3 cm2   Aortic Root Measurements   Sinotubular Junction: 28 mm   Ascending Thoracic Aorta: 35 mm   Aortic Arch: 29 mm   Descending Thoracic Aorta: 21 mm  Sinus of Valsalva Measurements:   Right coronary cusp width: 27 mm   Left coronary cusp width: 28 mm   Non coronary cusp width: 28 mm   Mean diameter: 28 mm   Coronary Artery Height above Annulus:   Left Main: 10 mm   Left SoV height: 16 mm   Right Coronary: 12 mm   Right SoV height: 16 mm   Optimum Fluoroscopic Angle for Delivery: LAO 3, CAU 7   Cusp overlay view angle:RAO 14, CAU 25   Valves for structural team consideration: Valve is at the lower limits for a 23 mm Sapien Valve vs upper limites for a 20 mm Sapien Valve. Coronary heights listed as above; there is sufficiency SoV diameter and height for a 26 mm CoreValve (lower limits of normal).   Non TAVR Valve Findings:   Coronary Calcium Score:   Left main: 398   Left anterior descending artery: 1165   Left circumflex artery: 40   Right coronary artery: 1524   Ramus Intermedius: 213   Total: 3340   Percentile: 99th for age, sex, and race matched control.   Aortic atherosclerosis noted.   Vascular air seen in the main pulmonary artery.   No left atrial appendage.   IMPRESSION: 1. Severe Aortic stenosis. Findings pertinent to TAVR procedure are detailed above.   2. Patient's total coronary artery calcium score is 3340, which is 99th percentile for subjects of the same age, gender, and race based populations.   3.  Aortic  atherosclerosis.   Recent Labs: 05/21/2020: ALT 21; TSH 1.150 09/16/2020: NT-Pro BNP 1,305 03/30/2021: BUN 7; Creatinine, Ser 0.59; Platelets 339 04/22/2021: Hemoglobin 11.6; Hemoglobin 11.6; Potassium 3.2; Potassium 3.2; Sodium 143; Sodium 141  Recent Lipid Panel    Component Value Date/Time   CHOL 199 05/21/2020 1024   TRIG 175 (H) 05/21/2020 1024   HDL 78 05/21/2020 1024   CHOLHDL 2.6 05/21/2020 1024   CHOLHDL 2.3 08/18/2015 1030   VLDL 22 08/18/2015 1030   LDLCALC 92 05/21/2020 1024   LDLDIRECT 133.8 12/19/2012 0828     Risk Assessment/Calculations:    CHA2DS2-VASc Score = 6   This indicates a 9.7% annual risk of stroke. The patient's score is based upon: CHF History: 1 HTN History: 1 Diabetes History: 0 Stroke History: 0 Vascular Disease History: 1 Age Score: 2 Gender Score: 1          Physical Exam:    VS:  BP 130/70    Pulse 69    Ht 5\' 1"  (1.549 m)    Wt 122 lb 6.4 oz (55.5 kg)    SpO2 98%    BMI 23.13 kg/m     Wt Readings from Last 3 Encounters:  05/12/21 122 lb 6.4 oz (55.5 kg)  05/04/21 126 lb (57.2 kg)  04/22/21 126 lb (57.2 kg)     GEN:  Well nourished, well developed in no acute distress HEENT: Normal NECK: No JVD; No carotid bruits LYMPHATICS: No lymphadenopathy CARDIAC: RRR, 3/6 harsh crescendo decrescendo murmur at the right upper sternal border, no diastolic murmur, diminished A2 RESPIRATORY:  Clear to auscultation without rales, wheezing or rhonchi  ABDOMEN: Soft, non-tender, non-distended MUSCULOSKELETAL:  No edema; No deformity  SKIN: Warm and dry NEUROLOGIC:  Alert and oriented x 3 PSYCHIATRIC:  Normal affect   ASSESSMENT:    1. Severe aortic stenosis    PLAN:    In order of problems listed above:  Complex situation in this patient with hypertrophic cardiomyopathy and mitral  regurgitation.  She does have severe asymmetric LVH with septal hypertrophy, systolic anterior motion of the mitral valve, and LV outflow obstruction.  I  reviewed some of the concerns about TAVR in patients with subvalvular stenosis.  However, the patient clearly has severe aortic stenosis and mild to moderate aortic insufficiency based on my personal review of her echo images again.  Her mean transaortic velocity is greater than 4 m/s with a mean gradient over 40 mmHg.  The valve is severely calcified and restricted.  Fortunately the patient has minimal symptoms at present and actually feels quite well, having recovered now from mild cough and shortness of breath that she experienced after her heart catheterization.  After full discussion of treatment options using a shared decision making model, we have agreed to follow-up closely in 6 months with a repeat echocardiogram.  If the patient develops any progressive symptoms of fatigue, dyspnea, or chest discomfort, she will notify me.  Ultimately it appears she will be best treated with TAVR using a self-expanding supra annular valve because of her relatively small aortic valve annulus. Current medications will be continued with no changes made today.      Medication Adjustments/Labs and Tests Ordered: Current medicines are reviewed at length with the patient today.  Concerns regarding medicines are outlined above.  Orders Placed This Encounter  Procedures   ECHOCARDIOGRAM COMPLETE   No orders of the defined types were placed in this encounter.   Patient Instructions  Medication Instructions:  No changes *If you need a refill on your cardiac medications before your next appointment, please call your pharmacy*   Lab Work: none If you have labs (blood work) drawn today and your tests are completely normal, you will receive your results only by: [place] (if you have MyChart) OR A paper copy in the mail If you have any lab test that is abnormal or we need to change your treatment, we will call you to review the results.   Testing/Procedures: Please plan for echo and same day office visit  with Dr. Burt Knack in May 2023.     Follow-Up:  Friday Sep 04, 2021 - echo at 1:05 pm (arrive by 12:50 pm) and visit with Dr. Burt Knack afterward at 2:00 pm   Addendum:  called and left VM for patient of her appointments Sep 04, 2021.     Signed, [name], MD  05/13/2021 3:33 PM    [place]

## 2021-05-13 ENCOUNTER — Encounter: Payer: Self-pay | Admitting: Cardiovascular Disease

## 2021-05-15 ENCOUNTER — Other Ambulatory Visit (HOSPITAL_COMMUNITY): Payer: Medicare HMO

## 2021-06-10 DIAGNOSIS — H26493 Other secondary cataract, bilateral: Secondary | ICD-10-CM | POA: Diagnosis not present

## 2021-06-10 DIAGNOSIS — Z961 Presence of intraocular lens: Secondary | ICD-10-CM | POA: Diagnosis not present

## 2021-06-10 DIAGNOSIS — H524 Presbyopia: Secondary | ICD-10-CM | POA: Diagnosis not present

## 2021-06-11 ENCOUNTER — Telehealth: Payer: Self-pay | Admitting: Cardiovascular Disease

## 2021-06-11 NOTE — Telephone Encounter (Signed)
Spoke with Dr. Ali Lowe since Dr. Burt Knack is out of office. Has requested to move up Echo and appointment.   Spoke with Joellen Jersey who is going to assist with scheduling and calling the patient.

## 2021-06-11 NOTE — Telephone Encounter (Signed)
Pt c/o of Chest Pain: STAT if CP now or developed within 24 hours  1. Are you having CP right now? no  2. Are you experiencing any other symptoms (ex. SOB, nausea, vomiting, sweating)? Shoulder and back pain, some indigestion   3. How long have you been experiencing CP? A couple of weeks  4. Is your CP continuous or coming and going? Comes and goes, only happens when she does her daily walks.   5. Have you taken Nitroglycerin? No   She states the chest pain is not major is like a tingle in her chest. It's more shoulder pain, like a tingling across her shoulder blades.  ?

## 2021-06-11 NOTE — Telephone Encounter (Signed)
Scheduled the patient for echo/office visit with Dr. Burt Knack 06/22/2021.  She will call the office if symptoms progress prior to that time. The patient was grateful for call and agrees with plan.

## 2021-06-11 NOTE — Telephone Encounter (Signed)
Complaining about tingling in her chest, more of a shoulder pain across her shoulder blades. She wanted to let Dr. Burt Knack know to make sure nothing needs to change regarding her severe aortic stenosis with 6 month echo and appointment that is currently due in May. The patient walks about a mile daily and has been doing so for several years. The pain only occurs when she is on her daily walk, not when she is moving though out her home. The pain will subside about 15 minutes after she has finished her walk. She is not having lightheadedness, dizziness, or shortness of breath with this pain or any other time. No Swelling and no new fatigue. No sickness with coughing. HR 80 BP 128/78. She has not missed any doses of her medication. Gave the patient ED precautions. Verbalized understanding.   Will route to Dr. Burt Knack for advisement.   Of note Heart Cath from December :   Mid RCA lesion is 50% stenosed.   Hemodynamic findings consistent with mild pulmonary hypertension.   There is severe aortic valve stenosis.   1.  Calcified but nonobstructive coronary artery disease with patency of the left main, LAD, left circumflex, ramus intermedius, and right coronary arteries

## 2021-06-17 DIAGNOSIS — H26491 Other secondary cataract, right eye: Secondary | ICD-10-CM | POA: Diagnosis not present

## 2021-06-22 ENCOUNTER — Ambulatory Visit (HOSPITAL_COMMUNITY): Payer: Medicare HMO | Attending: Cardiology

## 2021-06-22 ENCOUNTER — Ambulatory Visit: Payer: Medicare HMO | Admitting: Cardiovascular Disease

## 2021-06-22 ENCOUNTER — Encounter: Payer: Self-pay | Admitting: Cardiovascular Disease

## 2021-06-22 ENCOUNTER — Other Ambulatory Visit: Payer: Self-pay

## 2021-06-22 VITALS — BP 110/60 | HR 70 | Ht 60.0 in | Wt 123.4 lb

## 2021-06-22 DIAGNOSIS — I350 Nonrheumatic aortic (valve) stenosis: Secondary | ICD-10-CM | POA: Diagnosis not present

## 2021-06-22 LAB — ECHOCARDIOGRAM COMPLETE
AR max vel: 0.71 cm2
AV Area VTI: 0.52 cm2
AV Area mean vel: 0.57 cm2
AV Mean grad: 49.3 mmHg
AV Peak grad: 84.4 mmHg
Ao pk vel: 4.59 m/s
Area-P 1/2: 1.94 cm2
MV M vel: 6.85 m/s
MV Peak grad: 187.4 mmHg
P 1/2 time: 251 msec
S' Lateral: 1.95 cm

## 2021-06-22 NOTE — Progress Notes (Signed)
Cardiology Office Note:    Date:  06/23/2021   ID:  [name], [name] [birth year], MRN [id number]  PCP:  [name], MD   [facility] Providers Cardiologist:  [name], MD     Referring MD: [name], MD   Chief Complaint  Patient presents with   Aortic Stenosis    History of Present Illness:    Carrie [name] is a 76 y.o. female with a hx of aortic stenosis.  She underwent formal cardiac surgical consultation with Dr. Cyndia Bent May 04, 2021.  We subsequently discussed her case in our multidisciplinary team meeting.  The patient has paroxysmal atrial fibrillation, severe aortic stenosis, hypertrophic cardiomyopathy with subvalvular stenosis, and severe mitral annular calcification with at least moderate mitral regurgitation.  She was deemed a poor candidate for conventional heart surgery because of extensive calcification in the ascending aorta and aortic arch as well as the need for complex surgery which would involve double valve replacement and septal myectomy.  However, the patient is felt to be a potential candidate for TAVR.  We had extensive discussion at the time of her last office visit May 12, 2021 regard to potential treatment options.  At that time we elected through shared decision making conversation to continue with close clinical surveillance including a repeat echocardiogram next visit 25-month intervals.  However, the patient has started experiencing progressive symptoms with physical activity.  She called and was added onto clinic today with an updated echocardiogram.  She describes a feeling of discomfort in the upper back that occurs with ambulation.  She has never experienced this before.  Symptoms improved with rest.  She denies pain, pressure, or heaviness in the chest.  She has minimal dyspnea with exertion, no edema, no orthopnea, and no PND.  She also complains of progressive fatigue.  A repeat echocardiogram performed today  confirms worsening of her valvular aortic stenosis.  The patient is here with her daughter today.  Past Medical History:  Diagnosis Date   Aortic stenosis    Coronary artery disease    a. Prior LHC (07/24/03): LMCA heavily calcified without significant stenosis, LAD with heavy calcification proximally. 50-60% stenosis just distal to D2, LCx calcified proximally without significant stenosis, Dominant RCA with moderate proximal calcification and mild luminal irregularities throughout, LVEDP 12, LVEF 96%   Diastolic dysfunction without heart failure    Former tobacco use    Heart murmur    HOCM (hypertrophic obstructive cardiomyopathy) (HCC)    Mild AI (aortic insufficiency)    Osteoporosis, unspecified    Other and unspecified hyperlipidemia    Paroxysmal atrial flutter ([place])    Pneumonia    Wears hearing aid    left    Past Surgical History:  Procedure Laterality Date   ATRIAL ABLATION SURGERY  2005   BREAST SURGERY  1982   Benign tumor removed RT   BUNIONECTOMY  04/07/2012   Procedure: BUNIONECTOMY;  Surgeon: Alta Corning, MD;  Location: [place];  Service: Orthopedics;  Laterality: Left;  CHEVRON OSTEOTOMY LEFT FOOT    CARDIAC CATHETERIZATION  2005   GANGLION CYST EXCISION     left wrist   HERNIA REPAIR  1990   rt ing   RIGHT/LEFT HEART CATH AND CORONARY ANGIOGRAPHY N/A 04/22/2021   Procedure: RIGHT/LEFT HEART CATH AND CORONARY ANGIOGRAPHY;  Surgeon: [name], MD;  Location: [place] [facility];  Service: Cardiovascular;  Laterality: N/A;   TONSILLECTOMY     TUBAL LIGATION  Current Medications: Current Meds  Medication Sig   Cholecalciferol (VITAMIN D3) 1000 UNITS CAPS Take 1 capsule by mouth daily.   denosumab (PROLIA) 60 MG/ML SOSY injection Inject 60 mg into the skin every 6 (six) months.   diphenhydrAMINE (BENADRYL) 25 MG tablet Take 25 mg by mouth every 6 (six) hours as needed for allergies.   ELIQUIS 5 MG TABS tablet Take 1 tablet (5 mg  total) by mouth 2 (two) times daily.   metoprolol succinate (TOPROL-XL) 50 MG 24 hr tablet Take 1 tablet (50 mg total) by mouth daily. Take with or immediately following a meal.   rosuvastatin (CRESTOR) 20 MG tablet Take 1 tablet (20 mg total) by mouth daily.   vitamin B-12 (CYANOCOBALAMIN) 1000 MCG tablet Take 1,000 mcg by mouth daily.     Allergies:   Codeine, Crestor [rosuvastatin], and Latex   Social History   Socioeconomic History   Marital status: Divorced    Spouse name: Not on file   Number of children: 2   Years of education: Not on file   Highest education level: Not on file  Occupational History   Occupation: retired  Tobacco Use   Smoking status: Former    Types: Cigarettes    Quit date: 04/05/1999    Years since quitting: 22.2   Smokeless tobacco: Never   Tobacco comments:    Regular Exercise-No  Vaping Use   Vaping Use: Never used  Substance and Sexual Activity   Alcohol use: Yes    Alcohol/week: 7.0 standard drinks    Types: 7 Glasses of wine per week    Comment: 1 drink per week   Drug use: No   Sexual activity: Not on file  Other Topics Concern   Not on file  Social History Narrative   Not on file   Social Determinants of Health   Financial Resource Strain: Not on file  Food Insecurity: No Food Insecurity   Worried About Running Out of Food in the Last Year: Never true   Ran Out of Food in the Last Year: Never true  Transportation Needs: No Transportation Needs   Lack of Transportation (Medical): No   Lack of Transportation (Non-Medical): No  Physical Activity: Sufficiently Active   Days of Exercise per Week: 7 days   Minutes of Exercise per Session: 30 min  Stress: No Stress Concern Present   Feeling of Stress : Not at all  Social Connections: Moderately Integrated   Frequency of Communication with Friends and Family: More than three times a week   Frequency of Social Gatherings with Friends and Family: Once a week   Attends Religious  Services: More than 4 times per year   Active Member of [employer] or Organizations: Yes   Attends Music therapist: More than 4 times per year   Marital Status: Divorced     Family History: The patient's family history includes Congestive Heart Failure in her mother; Dementia in her mother; Heart disease (age of onset: 70) in her mother; Hypertension in an other family member; Valvular heart disease in her father. There is no history of Colon cancer or Rectal cancer.  ROS:   Please see the history of present illness.    All other systems reviewed and are negative.  EKGs/Labs/Other Studies Reviewed:    The following studies were reviewed today: Echo (06/22/21)  1. Normal LV function; severe basal septal hypertrophy with SAM and LVOT  gradient of 3.5 m/s with valsalva c/w HOCM; severe AS (mean  gradient 54  mmHg; peak velocity 4.7 m/s; DI 0.23); mild AI; mild MS (MVA 1.9 cm2);  moderate to severe MR. Aortic  stenosis worse compared to previous.   2. Left ventricular ejection fraction, by estimation, is 60 to 65%. The  left ventricle has normal function. The left ventricle has no regional  wall motion abnormalities. There is severe asymmetric left ventricular  hypertrophy of the basal-septal  segment. Left ventricular diastolic parameters are indeterminate. Elevated  left atrial pressure.   3. Right ventricular systolic function is normal. The right ventricular  size is normal.   4. Left atrial size was moderately dilated.   5. The mitral valve is normal in structure. Moderate to severe mitral  valve regurgitation. Mild mitral stenosis. Severe mitral annular  calcification.   6. The aortic valve is calcified. Aortic valve regurgitation is mild.  Severe aortic valve stenosis.   7. The inferior vena cava is normal in size with greater than 50%  respiratory variability, suggesting right atrial pressure of 3 mmHg.   Cardiac CTA: FINDINGS: Aortic Valve: Severely thickened  aortic valve with heavy calcification and reduced excursion the planimeter valve area is 0.84 Sq cm consistent with severe aortic stenosis   Number of leaflets: 3   LVOT calcification: none   Annular calcification: none   Aortic Valve Calcium Score: 3195   Presence of basal septal hypertrophy: 16 mm   - Systolic annulus assessment is still greater than diastolic assessment   Perimembranous septal diameter: 7 mm   Mitral Valve: There is severe mitral annular calcification   Aortic Annulus Measurements- 10% phase assessment   Major annulus diameter: 24 mm   Minor annulus diameter: 18 mm   Annular perimeter: 66 mm   Annular area: 3.3 cm2   Aortic Root Measurements   Sinotubular Junction: 28 mm   Ascending Thoracic Aorta: 35 mm   Aortic Arch: 29 mm   Descending Thoracic Aorta: 21 mm   Sinus of Valsalva Measurements:   Right coronary cusp width: 27 mm   Left coronary cusp width: 28 mm   Non coronary cusp width: 28 mm   Mean diameter: 28 mm   Coronary Artery Height above Annulus:   Left Main: 10 mm   Left SoV height: 16 mm   Right Coronary: 12 mm   Right SoV height: 16 mm   Optimum Fluoroscopic Angle for Delivery: LAO 3, CAU 7   Cusp overlay view angle:RAO 14, CAU 25   Valves for structural team consideration: Valve is at the lower limits for a 23 mm Sapien Valve vs upper limites for a 20 mm Sapien Valve. Coronary heights listed as above; there is sufficiency SoV diameter and height for a 26 mm CoreValve (lower limits of normal).   Non TAVR Valve Findings:   Coronary Calcium Score:   Left main: 398   Left anterior descending artery: 1165   Left circumflex artery: 40   Right coronary artery: 1524   Ramus Intermedius: 213   Total: 3340   Percentile: 99th for age, sex, and race matched control.   Aortic atherosclerosis noted.   Vascular air seen in the main pulmonary artery.   No left atrial appendage.   IMPRESSION: 1. Severe  Aortic stenosis. Findings pertinent to TAVR procedure are detailed above.   2. Patient's total coronary artery calcium score is 3340, which is 99th percentile for subjects of the same age, gender, and race based populations.   3.  Aortic atherosclerosis.  Cardiac Cath:  Mid RCA lesion is 50% stenosed.   Hemodynamic findings consistent with mild pulmonary hypertension.   There is severe aortic valve stenosis.   1.  Calcified but nonobstructive coronary artery disease with patency of the left main, LAD, left circumflex, ramus intermedius, and right coronary arteries 2.  Heavy mitral annular calcification with known moderate to severe mitral regurgitation and mild mitral stenosis by echo assessment 3.  Severe aortic stenosis with mean transvalvular gradient 27 mmHg and calculated aortic valve area 0.97 cm (probable combination of valvular and subvalvular stenosis) 4.  Mild pulmonary hypertension with mean pulmonary artery pressure 28 mmHg, transpulmonary gradient 9 mmHg, V wave 24 mmHg.   Continued evaluation of valvular heart disease with consideration of cardiac surgery  EKG:  EKG is not ordered today.    Recent Labs: 09/16/2020: NT-Pro BNP 1,305 03/30/2021: Platelets 339 04/22/2021: Hemoglobin 11.6; Hemoglobin 11.6 06/22/2021: BUN 7; Creatinine, Ser 0.71; Potassium 4.0; Sodium 140  Recent Lipid Panel    Component Value Date/Time   CHOL 199 05/21/2020 1024   TRIG 175 (H) 05/21/2020 1024   HDL 78 05/21/2020 1024   CHOLHDL 2.6 05/21/2020 1024   CHOLHDL 2.3 08/18/2015 1030   VLDL 22 08/18/2015 1030   LDLCALC 92 05/21/2020 1024   LDLDIRECT 133.8 12/19/2012 0828     Risk Assessment/Calculations:           Physical Exam:    VS:  BP 110/60    Pulse 70    Ht 5' (1.524 m)    Wt 123 lb 6.4 oz (56 kg)    SpO2 96%    BMI 24.10 kg/m     Wt Readings from Last 3 Encounters:  06/22/21 123 lb 6.4 oz (56 kg)  05/12/21 122 lb 6.4 oz (55.5 kg)  05/04/21 126 lb (57.2 kg)     GEN:   Well nourished, well developed, moderately kyphotic elderly woman in no acute distress HEENT: Normal NECK: No JVD; BL carotid bruits LYMPHATICS: No lymphadenopathy CARDIAC: RRR, 3/6 harsh late peaking crescendo decrescendo murmur at the right upper sternal border, absent A2, no diastolic murmur. RESPIRATORY:  Clear to auscultation without rales, wheezing or rhonchi  ABDOMEN: Soft, non-tender, non-distended MUSCULOSKELETAL:  No edema; No deformity  SKIN: Warm and dry NEUROLOGIC:  Alert and oriented x 3 PSYCHIATRIC:  Normal affect   ASSESSMENT:    1. Severe aortic stenosis    PLAN:    In order of problems listed above:  The patient has severe, stage D1 aortic stenosis.  This is associated with New York Heart Association functional class II symptoms of fatigue and exertional discomfort in the upper back that is likely an anginal equivalent.  I have again discussed natural history of aortic stenosis at length with the patient and her daughter.  We reviewed treatment options in the context of her known comorbid medical problems and age.  Shared decision making occurs.  She has already undergone formal cardiac surgical consultation and she is not a candidate for conventional heart surgery because of heavy calcification of the ascending aorta and complexity of surgery which would require aortic and mitral valve replacement as well as septal myectomy.  TAVR is a reasonable treatment consideration, especially in the setting of clearly progressive aortic stenosis.  Today's echocardiogram is reviewed and shows worsening of her aortic stenosis, now with a mean transvalvular gradient of 54 mmHg consistent with very severe aortic stenosis.  The patient has new onset exertional discomfort in the upper back and reports worsening fatigue.  I have reviewed risks, indications, and alternatives to transfemoral TAVR with the patient and her daughter today.  I have reviewed all of her imaging studies including the  gated CTA of the heart, the CTA of the chest, abdomen, and pelvis, and her cardiac catheterization and echo studies.  She is demonstrated to have no high-grade coronary obstruction. We discussed specific concerns about the presence of HCM with subvalvular obstruction and SAM. She understands that this has the potential to complicate her postoperative course. We also reviewed known complications of TAVR, including but not limited to vascular injury, bleeding, arrhythmia, heart block requiring PPM placement, stroke, MI, valve embolization, emergency cardiac surgery, early or late valve deterioration, leaflet thrombosis, endocarditis, and death. She understands that serious complications occur an incidence of approximately 1%, but need for pacemaker is as frequent as 8%. After review of all treatment options, she provides full informed consent for transcatheter aortic valve replacement.     Medication Adjustments/Labs and Tests Ordered: Current medicines are reviewed at length with the patient today.  Concerns regarding medicines are outlined above.  Orders Placed This Encounter  Procedures   Basic metabolic panel   No orders of the defined types were placed in this encounter.   Patient Instructions  Medication Instructions:  Your physician recommends that you continue on your current medications as directed. Please refer to the Current Medication list given to you today.  *If you need a refill on your cardiac medications before your next appointment, please call your pharmacy*   Lab Work: NONE If you have labs (blood work) drawn today and your tests are completely normal, you will receive your results only by: [place] (if you have MyChart) OR A paper copy in the mail If you have any lab test that is abnormal or we need to change your treatment, we will call you to review the results.   Testing/Procedures: NONE   Follow-Up: Structural team will Follow-up  Provider:   Joetta Manners, MD  06/23/2021 10:53 AM    [place]

## 2021-06-22 NOTE — Patient Instructions (Signed)
Medication Instructions:  Your physician recommends that you continue on your current medications as directed. Please refer to the Current Medication list given to you today.  *If you need a refill on your cardiac medications before your next appointment, please call your pharmacy*   Lab Work: NONE If you have labs (blood work) drawn today and your tests are completely normal, you will receive your results only by: [place] (if you have MyChart) OR A paper copy in the mail If you have any lab test that is abnormal or we need to change your treatment, we will call you to review the results.   Testing/Procedures: NONE   Follow-Up: Structural team will Follow-up  Provider:   [name]

## 2021-06-22 NOTE — Progress Notes (Signed)
Pre Surgical Assessment: 5 M Walk Test  80M=16.37ft  5 Meter Walk Test- trial 1: 7.92 seconds 5 Meter Walk Test- trial 2: 7.37 seconds 5 Meter Walk Test- trial 3: 6.27 seconds 5 Meter Walk Test Average: 7.19 seconds

## 2021-06-23 LAB — BASIC METABOLIC PANEL
BUN/Creatinine Ratio: 10 — ABNORMAL LOW (ref 12–28)
BUN: 7 mg/dL — ABNORMAL LOW (ref 8–27)
CO2: 24 mmol/L (ref 20–29)
Calcium: 9 mg/dL (ref 8.7–10.3)
Chloride: 105 mmol/L (ref 96–106)
Creatinine, Ser: 0.71 mg/dL (ref 0.57–1.00)
Glucose: 85 mg/dL (ref 70–99)
Potassium: 4 mmol/L (ref 3.5–5.2)
Sodium: 140 mmol/L (ref 134–144)
eGFR: 88 mL/min/{1.73_m2} (ref >59)

## 2021-06-24 DIAGNOSIS — H26492 Other secondary cataract, left eye: Secondary | ICD-10-CM | POA: Diagnosis not present

## 2021-07-07 ENCOUNTER — Other Ambulatory Visit: Payer: Self-pay

## 2021-07-07 ENCOUNTER — Encounter: Payer: Self-pay | Admitting: Internal Medicine

## 2021-07-07 ENCOUNTER — Ambulatory Visit (INDEPENDENT_AMBULATORY_CARE_PROVIDER_SITE_OTHER): Payer: Medicare HMO | Admitting: Internal Medicine

## 2021-07-07 DIAGNOSIS — J069 Acute upper respiratory infection, unspecified: Secondary | ICD-10-CM | POA: Diagnosis not present

## 2021-07-07 MED ORDER — BENZONATATE 200 MG PO CAPS: 200.0000 mg | ORAL_CAPSULE | Freq: Three times a day (TID) | ORAL | 0 refills | Status: DC | PRN

## 2021-07-07 NOTE — Patient Instructions (Signed)
We have sent in tessalon perles to use up to 3 times a day for cough. ? ?You can take zyrtec (cetirizine) over the counter to use to help the drainage. ? ? ?

## 2021-07-07 NOTE — Progress Notes (Signed)
? ?  Subjective:  ? ?Patient ID: Carrie [name], female    DOB: 03-25-1946, 76 y.o.   MRN: [id number] ? ?HPI ?The patient is a 76 YO female coming in for cough and drainage. ? ?Review of Systems  ?Constitutional:  Negative for activity change, appetite change, chills, fatigue, fever and unexpected weight change.  ?HENT:  Positive for congestion, postnasal drip and voice change. Negative for ear discharge, ear pain, rhinorrhea, sinus pressure, sinus pain, sneezing, sore throat, tinnitus and trouble swallowing.   ?Eyes: Negative.   ?Respiratory:  Positive for cough. Negative for chest tightness, shortness of breath and wheezing.   ?Cardiovascular: Negative.   ?Gastrointestinal: Negative.   ?Musculoskeletal:  Negative for myalgias.  ?Neurological: Negative.   ? ?Objective:  ?Physical Exam ?Constitutional:   ?   Appearance: She is well-developed.  ?HENT:  ?   Head: Normocephalic and atraumatic.  ?   Comments: Oropharynx with redness and clear drainage, nose with swollen turbinates, TMs normal bilaterally.  ?Neck:  ?   Thyroid: No thyromegaly.  ?Cardiovascular:  ?   Rate and Rhythm: Normal rate and regular rhythm.  ?Pulmonary:  ?   Effort: Pulmonary effort is normal. No respiratory distress.  ?   Breath sounds: Normal breath sounds. No wheezing or rales.  ?Abdominal:  ?   General: Bowel sounds are normal. There is no distension.  ?   Palpations: Abdomen is soft.  ?   Tenderness: There is no abdominal tenderness. There is no rebound.  ?Musculoskeletal:     ?   General: No tenderness.  ?   Cervical back: Normal range of motion.  ?Lymphadenopathy:  ?   Cervical: No cervical adenopathy.  ?Skin: ?   General: Skin is warm and dry.  ?Neurological:  ?   Mental Status: She is alert and oriented to person, place, and time.  ?   Coordination: Coordination normal.  ? ? ?Vitals:  ? 07/07/21 1507  ?BP: 116/68  ?Pulse: 75  ?Temp: 97.9 ?F (36.6 ?C)  ?TempSrc: Oral  ?SpO2: 98%  ?Weight: 120 lb 6.4 oz (54.6 kg)  ?Height: 5' (1.524 m)   ? ? ?This visit occurred during the SARS-CoV-2 public health emergency.  Safety protocols were in place, including screening questions prior to the visit, additional usage of staff PPE, and extensive cleaning of exam room while observing appropriate contact time as indicated for disinfecting solutions.  ? ?Assessment & Plan:  ? ?

## 2021-07-08 NOTE — Assessment & Plan Note (Signed)
Symptoms ongoing for a couple days. Overall improving. Rx tessalon perles to help reduce cough and advised to start zyrtec otc to help with drainage and congestion. No indication for antibiotics or steroids today. Call back in 3-4 days if no improvement.  ?

## 2021-07-20 ENCOUNTER — Other Ambulatory Visit: Payer: Self-pay

## 2021-07-20 DIAGNOSIS — I350 Nonrheumatic aortic (valve) stenosis: Secondary | ICD-10-CM

## 2021-07-27 NOTE — Telephone Encounter (Signed)
Prolia VOB initiated via [email or web address] ? ?Last OV: 09/01/20, 07/07/21 (acute) ?Next OV:  ?Last Prolia inj: 03/05/21 ?Next Prolia inj DUE: 09/03/21 ? ?

## 2021-07-28 ENCOUNTER — Telehealth: Payer: Self-pay | Admitting: Physician Assistant

## 2021-07-28 NOTE — Telephone Encounter (Signed)
?  [facility]   ?MULTIDISCIPLINARY HEART VALVE TEAM ? ? ?The patient was scheduled for TAVR 08/04/21 with Dr. Burt Knack and Dr Cyndia Bent. She called into the office today to cancel her case due to the sudden death of her ex husband of 57 years. She does not feel ready to have her surgery as she needs to be there for her children. She requested that her surgery be pushed out. I was able to move her case to 08/11/21. The schedules will be adjusted. I offered my condolences for her loss.  ? ? ?Angelena Form PA-C  [facility]  ? ?

## 2021-07-28 NOTE — Telephone Encounter (Signed)
Thx Katie ?

## 2021-07-31 ENCOUNTER — Other Ambulatory Visit (HOSPITAL_COMMUNITY): Payer: Medicare HMO

## 2021-08-07 ENCOUNTER — Other Ambulatory Visit (HOSPITAL_COMMUNITY): Payer: Medicare HMO

## 2021-08-13 NOTE — Telephone Encounter (Signed)
Prior auth required for PROLIA  PA PROCESS DETAILS: Precertification is required. Call [phone] or complete the Precertification form available at [email or web address] 

## 2021-08-25 NOTE — Telephone Encounter (Signed)
Pt ready for scheduling on or after 09/03/21 ? ?Out-of-pocket cost due at time of visit: $301 ? ?Primary: [employer] Medicare ?Prolia co-insurance: 20% (approximately $276) ?Admin fee co-insurance: 20% (approximately $25) ? ?Secondary: n/a ?Prolia co-insurance:  ?Admin fee co-insurance:  ? ?Deductible: does not apply ? ?Prior Auth: APPROVED ?PA# [id number] ?Valid: 03/02/21-03/02/22 ? ?** This summary of benefits is an estimation of the patient's out-of-pocket cost. Exact cost may vary based on individual plan coverage.  ? ?

## 2021-08-28 ENCOUNTER — Other Ambulatory Visit: Payer: Self-pay

## 2021-08-28 DIAGNOSIS — I350 Nonrheumatic aortic (valve) stenosis: Secondary | ICD-10-CM

## 2021-09-03 NOTE — Pre-Procedure Instructions (Signed)
Surgical Instructions ? ? ? Your procedure is scheduled on Tuesday, May 16th. ? Report to [facility] Main Entrance "A" at 10:15 A.M., then check in with the Admitting office. ? Call this number if you have problems the morning of surgery: ? [phone] ? ? If you have any questions prior to your surgery date call [phone]: Open Monday-Friday 8am-4pm ? ? ? Remember: ? Do not eat or drink after midnight the night before your surgery ? ? Take NO MEDICATIONS the morning of surgery. ? ?As of today, STOP taking any Aspirin (unless otherwise instructed by your surgeon) Aleve, Naproxen, Ibuprofen, Motrin, Advil, Goody's, BC's, all herbal medications, fish oil, and all vitamins. ?         ?           ?Do NOT Smoke (Tobacco/Vaping) for 24 hours prior to your procedure. ? ?If you use a CPAP at night, you may bring your mask/headgear for your overnight stay. ?  ?Contacts, glasses, piercing's, hearing aid's, dentures or partials may not be worn into surgery, please bring cases for these belongings.  ?  ?For patients admitted to the hospital, discharge time will be determined by your treatment team. ?  ?Patients discharged the day of surgery will not be allowed to drive home, and someone needs to stay with them for 24 hours. ? ?SURGICAL WAITING ROOM VISITATION ?Patients having surgery or a procedure may have two support people in the waiting room. These visitors may be switched out with other visitors if needed. ?Children under the age of 33 must have an adult accompany them who is not the patient. ?If the patient needs to stay at the hospital during part of their recovery, the visitor guidelines for inpatient rooms apply. ? ?Please refer to the [facility] website for the visitor guidelines for Inpatients (after your surgery is over and you are in a regular room).  ? ? ?Special instructions:   ?[place]- Preparing For Surgery ? ?Before surgery, you can play an important role. Because skin is not sterile, your skin needs  to be as free of germs as possible. You can reduce the number of germs on your skin by washing with CHG (chlorahexidine gluconate) Soap before surgery.  CHG is an antiseptic cleaner which kills germs and bonds with the skin to continue killing germs even after washing.   ? ?Oral Hygiene is also important to reduce your risk of infection.  Remember - BRUSH YOUR TEETH THE MORNING OF SURGERY WITH YOUR REGULAR TOOTHPASTE ? ?Please do not use if you have an allergy to CHG or antibacterial soaps. If your skin becomes reddened/irritated stop using the CHG.  ?Do not shave (including legs and underarms) for at least 48 hours prior to first CHG shower. It is OK to shave your face. ? ?Please follow these instructions carefully. ?  ?Shower the NIGHT BEFORE SURGERY and the MORNING OF SURGERY ? ?If you chose to wash your hair, wash your hair first as usual with your normal shampoo. ? ?After you shampoo, rinse your hair and body thoroughly to remove the shampoo. ? ?Use CHG Soap as you would any other liquid soap. You can apply CHG directly to the skin and wash gently with a scrungie or a clean washcloth.  ? ?Apply the CHG Soap to your body ONLY FROM THE NECK DOWN.  Do not use on open wounds or open sores. Avoid contact with your eyes, ears, mouth and genitals (private parts). Wash Face and genitals (private parts)  with your normal soap.  ? ?Wash thoroughly, paying special attention to the area where your surgery will be performed. ? ?Thoroughly rinse your body with warm water from the neck down. ? ?DO NOT shower/wash with your normal soap after using and rinsing off the CHG Soap. ? ?Pat yourself dry with a CLEAN TOWEL. ? ?Wear CLEAN PAJAMAS to bed the night before surgery ? ?Place CLEAN SHEETS on your bed the night before your surgery ? ?DO NOT SLEEP WITH PETS. ? ? ?Day of Surgery: ?Take a shower with CHG soap. ?Do not wear jewelry or makeup ?Do not wear lotions, powders, perfumes, or deodorant. ?Do not shave 48 hours prior to  surgery.  ?Do not bring valuables to the hospital.  ?[facility] is not responsible for any belongings or valuables. ?Do not wear nail polish, gel polish, artificial nails, or any other type of covering on natural nails (fingers and toes) ?If you have artificial nails or gel coating that need to be removed by a nail salon, please have this removed prior to surgery. Artificial nails or gel coating may interfere with anesthesia's ability to adequately monitor your vital signs. ?Wear Clean/Comfortable clothing the morning of surgery ?Remember to brush your teeth WITH YOUR REGULAR TOOTHPASTE. ?  ?Please read over the following fact sheets that you were given. ? ? ? ?If you received a COVID test during your pre-op visit  it is requested that you wear a mask when out in public, stay away from anyone that may not be feeling well and notify your surgeon if you develop symptoms. If you have been in contact with anyone that has tested positive in the last 10 days please notify you surgeon. ? ?

## 2021-09-04 ENCOUNTER — Encounter (HOSPITAL_COMMUNITY): Payer: Self-pay

## 2021-09-04 ENCOUNTER — Other Ambulatory Visit: Payer: Self-pay

## 2021-09-04 ENCOUNTER — Ambulatory Visit: Payer: Medicare HMO | Admitting: Cardiovascular Disease

## 2021-09-04 ENCOUNTER — Other Ambulatory Visit (HOSPITAL_COMMUNITY): Payer: Medicare HMO

## 2021-09-04 ENCOUNTER — Encounter (HOSPITAL_COMMUNITY)
Admission: RE | Admit: 2021-09-04 | Discharge: 2021-09-04 | Disposition: A | Discharge status: Home / Self Care | Payer: Medicare HMO | Source: Ambulatory Visit | Attending: Cardiovascular Disease | Admitting: Cardiovascular Disease

## 2021-09-04 VITALS — BP 100/52 | HR 79 | Temp 97.6°F | Resp 17 | Ht 61.0 in | Wt 122.4 lb

## 2021-09-04 DIAGNOSIS — I2510 Atherosclerotic heart disease of native coronary artery without angina pectoris: Secondary | ICD-10-CM | POA: Diagnosis not present

## 2021-09-04 DIAGNOSIS — Z20822 Contact with and (suspected) exposure to covid-19: Secondary | ICD-10-CM | POA: Insufficient documentation

## 2021-09-04 DIAGNOSIS — Z01818 Encounter for other preprocedural examination: Secondary | ICD-10-CM | POA: Insufficient documentation

## 2021-09-04 DIAGNOSIS — I350 Nonrheumatic aortic (valve) stenosis: Secondary | ICD-10-CM | POA: Diagnosis not present

## 2021-09-04 DIAGNOSIS — I5032 Chronic diastolic (congestive) heart failure: Secondary | ICD-10-CM | POA: Diagnosis not present

## 2021-09-04 DIAGNOSIS — I700 Atherosclerosis of aorta: Secondary | ICD-10-CM | POA: Diagnosis not present

## 2021-09-04 DIAGNOSIS — Z87891 Personal history of nicotine dependence: Secondary | ICD-10-CM | POA: Diagnosis not present

## 2021-09-04 DIAGNOSIS — E785 Hyperlipidemia, unspecified: Secondary | ICD-10-CM | POA: Insufficient documentation

## 2021-09-04 DIAGNOSIS — S22000A Wedge compression fracture of unspecified thoracic vertebra, initial encounter for closed fracture: Secondary | ICD-10-CM | POA: Diagnosis not present

## 2021-09-04 LAB — COMPREHENSIVE METABOLIC PANEL
ALT: 17 U/L (ref 0–44)
AST: 26 U/L (ref 15–41)
Albumin: 3.8 g/dL (ref 3.5–5.0)
Alkaline Phosphatase: 74 U/L (ref 38–126)
Anion gap: 7 (ref 5–15)
BUN: 9 mg/dL (ref 8–23)
CO2: 27 mmol/L (ref 22–32)
Calcium: 9.3 mg/dL (ref 8.9–10.3)
Chloride: 104 mmol/L (ref 98–111)
Creatinine, Ser: 0.76 mg/dL (ref 0.44–1.00)
GFR, Estimated: >60 mL/min (ref >60)
Glucose, Bld: 82 mg/dL (ref 70–99)
Potassium: 4.3 mmol/L (ref 3.5–5.1)
Sodium: 138 mmol/L (ref 135–145)
Total Bilirubin: 0.9 mg/dL (ref 0.3–1.2)
Total Protein: 7.8 g/dL (ref 6.5–8.1)

## 2021-09-04 LAB — SARS CORONAVIRUS 2 (TAT 6-24 HRS): SARS Coronavirus 2: NEGATIVE

## 2021-09-04 LAB — PROTIME-INR
INR: 1.1 (ref 0.8–1.2)
Prothrombin Time: 13.6 seconds (ref 11.4–15.2)

## 2021-09-04 LAB — URINALYSIS, ROUTINE W REFLEX MICROSCOPIC
Bilirubin Urine: NEGATIVE
Glucose, UA: NEGATIVE mg/dL
Ketones, ur: NEGATIVE mg/dL
Nitrite: NEGATIVE
Protein, ur: NEGATIVE mg/dL
Specific Gravity, Urine: 1.016 (ref 1.005–1.030)
pH: 5 (ref 5.0–8.0)

## 2021-09-04 LAB — TYPE AND SCREEN
ABO/RH(D): O NEG
Antibody Screen: NEGATIVE

## 2021-09-04 LAB — CBC
HCT: 40.6 % (ref 36.0–46.0)
Hemoglobin: 13.1 g/dL (ref 12.0–15.0)
MCH: 30.9 pg (ref 26.0–34.0)
MCHC: 32.3 g/dL (ref 30.0–36.0)
MCV: 95.8 fL (ref 80.0–100.0)
Platelets: 293 10*3/uL (ref 150–400)
RBC: 4.24 MIL/uL (ref 3.87–5.11)
RDW: 14 % (ref 11.5–15.5)
WBC: 8.1 10*3/uL (ref 4.0–10.5)
nRBC: 0 % (ref 0.0–0.2)

## 2021-09-04 LAB — SURGICAL PCR SCREEN
MRSA, PCR: NEGATIVE
Staphylococcus aureus: POSITIVE — AB

## 2021-09-04 NOTE — Progress Notes (Addendum)
PCP - Scarlette Calico, MD ?Cardiologist - Burt Knack, MD ? ?PPM/ICD - denies ?Device Orders - n/a ?Rep Notified - n/a ? ?Chest x-ray - 09/04/2021 ?EKG - 09/04/2021 ?Stress Test - 10/20/2020 ?ECHO - 06/22/2021 ?Cardiac Cath - 04/22/2021 ? ?Sleep Study - denies ?CPAP - n/a ? ?Fasting Blood Sugar - n/a ? ?Blood Thinner Instructions: Eliquis - last dose - 09/02/2021 per patient ?Aspirin Instructions: Patient was instructed: As of today, STOP taking any Aspirin (unless otherwise instructed by your surgeon) Aleve, Naproxen, Ibuprofen, Motrin, Advil, Goody's, BC's, all herbal medications, fish oil, and all vitamins. ?         ? ?ERAS Protcol - n/a ? ?COVID TEST- done in PAT on 09/04/2021 ? ? ?Anesthesia review: yes - cardiac history; abnormal labs in PAT - Dr. Burt Knack was notified via [place]. ? ?Patient denies shortness of breath, fever, cough and chest pain at PAT appointment ? ? ?All instructions explained to the patient, with a verbal understanding of the material. Patient agrees to go over the instructions while at home for a better understanding. Patient also instructed to self quarantine after being tested for COVID-19. The opportunity to ask questions was provided. ?  ?

## 2021-09-04 NOTE — Progress Notes (Signed)
Abnormal labs in PAT: Leukocytes, UA - trace; Bacteria, UA - rare; Hgb urine dipstick - small. Dr. Burt Knack was notified via [place]. Dr. Antionette Char office was called with no success. ?

## 2021-09-07 ENCOUNTER — Encounter (HOSPITAL_COMMUNITY): Payer: Self-pay | Admitting: Cardiovascular Disease

## 2021-09-07 ENCOUNTER — Ambulatory Visit: Payer: Medicare HMO | Admitting: Cardiovascular Disease

## 2021-09-07 ENCOUNTER — Other Ambulatory Visit (HOSPITAL_COMMUNITY): Payer: Medicare HMO

## 2021-09-07 MED ORDER — NOREPINEPHRINE 4 MG/250ML-% IV SOLN
0.0000 ug/min | INTRAVENOUS | Status: AC
  Administered 2021-09-08: 1 ug/min via INTRAVENOUS
  Filled 2021-09-07: qty 250

## 2021-09-07 MED ORDER — DEXMEDETOMIDINE HCL IN NACL 400 MCG/100ML IV SOLN
0.1000 ug/kg/h | INTRAVENOUS | Status: AC
  Administered 2021-09-08: 27.76 ug via INTRAVENOUS
  Administered 2021-09-08: 1 ug/kg/h via INTRAVENOUS
  Filled 2021-09-07 (×2): qty 100

## 2021-09-07 MED ORDER — POTASSIUM CHLORIDE 2 MEQ/ML IV SOLN
80.0000 meq | INTRAVENOUS | Status: DC
  Filled 2021-09-07: qty 40

## 2021-09-07 MED ORDER — HEPARIN 30,000 UNITS/1000 ML (OHS) CELLSAVER SOLUTION
Status: DC
  Filled 2021-09-07: qty 1000

## 2021-09-07 MED ORDER — MAGNESIUM SULFATE 50 % IJ SOLN
40.0000 meq | INTRAMUSCULAR | Status: DC
  Filled 2021-09-07: qty 9.85

## 2021-09-07 MED ORDER — CEFAZOLIN SODIUM-DEXTROSE 2-4 GM/100ML-% IV SOLN
2.0000 g | INTRAVENOUS | Status: AC
  Administered 2021-09-08: 2 g via INTRAVENOUS
  Filled 2021-09-07 (×3): qty 100

## 2021-09-07 NOTE — H&P (Signed)
? ?   ?[address] ?      [place] [address] ?            [phone]   ? ?  ?Cardiothoracic Surgery Admission History and Physical ? ? ?PCP is [name], MD ?Referring Provider is [name], MD ?Primary Cardiologist is [name], MD (prior) ?  ?Reason for admission:  Severe aortic stenosis, moderate aortic insufficiency, and moderate to severe mitral regurgitation. ?  ?HPI: ?  ?The patient is a 76 year old woman with a history of paroxysmal atrial fibrillation, HOCM, moderate nonobstructive LAD disease, and aortic stenosis that has been followed for the past 10 years with echocardiogram.  She had an echo in May 2022 showing severe calcification of the aortic valve with a mean gradient of 44 mmHg consistent with severe aortic stenosis.  The mitral valve was also degenerative with severe mitral regurgitation and systolic anterior motion of mitral valve.  There was moderate to severe mitral annular calcification.  Left ventricular ejection fraction was 65 to 70% with severe asymmetric left ventricular hypertrophy of the septal segment.  The peak gradient was 26 mmHg across the LVOT.  She was asymptomatic and walking greater than 1 mile per day.  She underwent an exercise stress echocardiogram in June 2022 which brought out no symptoms.  There was severe aortic stenosis with a mean gradient of 47 mmHg and severe mitral regurgitation due to systolic anterior motion.  There was moderate aortic insufficiency.  Her BNP was elevated at 1305 on 09/16/2020.  Her most recent echocardiogram on 03/23/2021 showed severe aortic stenosis with a mean gradient of 46 mmHg and a valve area of 0.7 cm? with a dimensionless index of 0.32.  There is severe mitral annular calcification with moderate to severe mitral regurgitation with a mean gradient of 5 mmHg.  Left ventricular ejection fraction is 60 to 65% with severe asymmetric left ventricular hypertrophy of the basal septal segment.  The LVOT peak  gradient was 21 mmHg. ?  ?She continues to deny any symptoms although she said she does not feel as well since her catheterization with fatigue with activity.  Her daughter notes that she developed a bad cough right after the catheterization that persisted.  She said that she felt like her energy level is decreased after the catheterization.  She has had no shortness of breath.  She denies dizziness and syncope.  She has had no peripheral edema.  She denies any chest pain or pressure. ?  ?    ?Past Medical History:  ?Diagnosis Date  ? Aortic stenosis    ? Coronary artery disease    ?  a. Prior LHC (07/24/03): LMCA heavily calcified without significant stenosis, LAD with heavy calcification proximally. 50-60% stenosis just distal to D2, LCx calcified proximally without significant stenosis, Dominant RCA with moderate proximal calcification and mild luminal irregularities throughout, LVEDP 12, LVEF 70%  ? Diastolic dysfunction without heart failure    ? Former tobacco use    ? Heart murmur    ? HOCM (hypertrophic obstructive cardiomyopathy) ([place])    ? Mild AI (aortic insufficiency)    ? Osteoporosis, unspecified    ? Other and unspecified hyperlipidemia    ? Paroxysmal atrial flutter ([facility])    ? Pneumonia    ? Wears hearing aid    ?  left  ?  ?  ?     ?Past Surgical History:  ?Procedure Laterality Date  ? ATRIAL ABLATION SURGERY   2005  ?  BREAST SURGERY   1982  ?  Benign tumor removed RT  ? BUNIONECTOMY   04/07/2012  ?  Procedure: BUNIONECTOMY;  Surgeon: Alta Corning, MD;  Location: [facility];  Service: Orthopedics;  Laterality: Left;  CHEVRON OSTEOTOMY LEFT FOOT   ? CARDIAC CATHETERIZATION   2005  ? GANGLION CYST EXCISION      ?  left wrist  ? HERNIA REPAIR   1990  ?  rt ing  ? RIGHT/LEFT HEART CATH AND CORONARY ANGIOGRAPHY N/A 04/22/2021  ?  Procedure: RIGHT/LEFT HEART CATH AND CORONARY ANGIOGRAPHY;  Surgeon: [name], MD;  Location: [facility];  Service: Cardiovascular;  Laterality:  N/A;  ? TONSILLECTOMY      ? TUBAL LIGATION      ?  ?  ?     ?Family History  ?Problem Relation Age of Onset  ? Heart disease Mother 63  ? Congestive Heart Failure Mother    ? Dementia Mother    ? Hypertension Other    ? Valvular heart disease Father    ?      Had valves replaced  ? Colon cancer Neg Hx    ? Rectal cancer Neg Hx    ?  ?  ?Social History  ?  ?     ?Socioeconomic History  ? Marital status: Divorced  ?    Spouse name: Not on file  ? Number of children: 2  ? Years of education: Not on file  ? Highest education level: Not on file  ?Occupational History  ? Occupation: retired  ?Tobacco Use  ? Smoking status: Former  ?    Types: Cigarettes  ?    Quit date: 04/05/1999  ?    Years since quitting: 22.0  ? Smokeless tobacco: Never  ? Tobacco comments:  ?    Regular Exercise-No  ?Vaping Use  ? Vaping Use: Never used  ?Substance and Sexual Activity  ? Alcohol use: Yes  ?    Alcohol/week: 7.0 standard drinks  ?    Types: 7 Glasses of wine per week  ?    Comment: 1 drink per week  ? Drug use: No  ? Sexual activity: Not on file  ?Other Topics Concern  ? Not on file  ?Social History Narrative  ? Not on file  ?  ?Social Determinants of Health  ?  ?   ?Financial Resource Strain: Not on file  ?Food Insecurity: No Food Insecurity  ? Worried About Charity fundraiser in the Last Year: Never true  ? Ran Out of Food in the Last Year: Never true  ?Transportation Needs: No Transportation Needs  ? Lack of Transportation (Medical): No  ? Lack of Transportation (Non-Medical): No  ?Physical Activity: Sufficiently Active  ? Days of Exercise per Week: 7 days  ? Minutes of Exercise per Session: 30 min  ?Stress: No Stress Concern Present  ? Feeling of Stress : Not at all  ?Social Connections: Moderately Integrated  ? Frequency of Communication with Friends and Family: More than three times a week  ? Frequency of Social Gatherings with Friends and Family: Once a week  ? Attends Religious Services: More than 4 times per year  ? Active  Member of Clubs or Organizations: Yes  ? Attends Archivist Meetings: More than 4 times per year  ? Marital Status: Divorced  ?Intimate Partner Violence: Not At Risk  ? Fear of Current or Ex-Partner: No  ? Emotionally Abused:  No  ? Physically Abused: No  ? Sexually Abused: No  ?  ?  ?       ?Prior to Admission medications   ?Medication Sig Start Date End Date Taking? Authorizing Provider  ?Cholecalciferol (VITAMIN D3) 1000 UNITS CAPS Take 1 capsule by mouth daily.     Yes [provider]  ?denosumab (PROLIA) 60 MG/ML SOSY injection Inject 60 mg into the skin every 6 (six) months.     Yes [provider]  ?diphenhydrAMINE (BENADRYL) 25 MG tablet Take 25 mg by mouth every 6 (six) hours as needed for allergies.     Yes [provider]  ?ELIQUIS 5 MG TABS tablet Take 1 tablet (5 mg total) by mouth 2 (two) times daily. 09/23/20   Yes [name], MD  ?metoprolol succinate (TOPROL-XL) 50 MG 24 hr tablet TAKE 1 TABLET (50 MG TOTAL) BY MOUTH DAILY. TAKE WITH OR IMMEDIATELY FOLLOWING A MEAL. 07/04/20   Yes Dorothy Spark, MD  ?rosuvastatin (CRESTOR) 20 MG tablet TAKE 1 TABLET BY MOUTH EVERY DAY 05/23/20   Yes Dorothy Spark, MD  ?vitamin B-12 (CYANOCOBALAMIN) 1000 MCG tablet Take 1,000 mcg by mouth daily.     Yes [provider]  ?  ?  ?      ?Current Outpatient Medications  ?Medication Sig Dispense Refill  ? Cholecalciferol (VITAMIN D3) 1000 UNITS CAPS Take 1 capsule by mouth daily.      ? denosumab (PROLIA) 60 MG/ML SOSY injection Inject 60 mg into the skin every 6 (six) months.      ? diphenhydrAMINE (BENADRYL) 25 MG tablet Take 25 mg by mouth every 6 (six) hours as needed for allergies.      ? ELIQUIS 5 MG TABS tablet Take 1 tablet (5 mg total) by mouth 2 (two) times daily. 180 tablet 1  ? metoprolol succinate (TOPROL-XL) 50 MG 24 hr tablet TAKE 1 TABLET (50 MG TOTAL) BY MOUTH DAILY. TAKE WITH OR IMMEDIATELY FOLLOWING A MEAL. 90 tablet 2  ? rosuvastatin (CRESTOR) 20  MG tablet TAKE 1 TABLET BY MOUTH EVERY DAY 90 tablet 3  ? vitamin B-12 (CYANOCOBALAMIN) 1000 MCG tablet Take 1,000 mcg by mouth daily.      ?  ?No current facility-administered medications for this visit

## 2021-09-07 NOTE — Anesthesia Preprocedure Evaluation (Addendum)
Anesthesia Evaluation  ?Patient identified by MRN, date of birth, ID band ?Patient awake ? ? ? ?Reviewed: ?Allergy & Precautions, NPO status , Patient's Chart, lab work & pertinent test results, reviewed documented beta blocker date and time  ? ?History of Anesthesia Complications ?Negative for: history of anesthetic complications ? ?Airway ?Mallampati: I ? ?TM Distance: >3 FB ?Neck ROM: Full ? ? ? Dental ? ?(+) Dental Advisory Given ?  ?Pulmonary ?former smoker,  ?09/04/2021 SARS coronavirus NEG ?  ?breath sounds clear to auscultation ? ? ? ? ? ? Cardiovascular ?hypertension, Pt. on medications and Pt. on home beta blockers ?(-) angina+ CAD (non-obstructive)  ?+ dysrhythmias (s/p ablation) Atrial Fibrillation + Valvular Problems/Murmurs AS and MR  ?Rhythm:Irregular Rate:Normal ? ?05/2021 ECHO: EF 60-65%, normal LVF, no regional wall motion abnormalities, severe ASH of basal-septal segment, normal RVF, mod-severe MR, severe AS with mild AI, Severe aortic stenosis with mean transvalvular gradient 27 mmHg and calculated aortic valve area 0.97 cm? (probable combination of valvular and subvalvular stenosis) ?  ?Neuro/Psych ? Headaches,   ? GI/Hepatic ?Neg liver ROS, GERD  Controlled,  ?Endo/Other  ?negative endocrine ROS ? Renal/GU ?negative Renal ROS  ? ?  ?Musculoskeletal ? ? Abdominal ?  ?Peds ? Hematology ?eliquis   ?Anesthesia Other Findings ? ? Reproductive/Obstetrics ? ?  ? ? ? ? ? ? ? ? ? ? ? ? ? ?  ?  ? ? ? ? ? ? ?Anesthesia Physical ?Anesthesia Plan ? ?ASA: 4 ? ?Anesthesia Plan: MAC  ? ?Post-op Pain Management: Tylenol PO (pre-op)*  ? ?Induction:  ? ?PONV Risk Score and Plan: 2 and Ondansetron and Treatment may vary due to age or medical condition ? ?Airway Management Planned: Simple Face Mask and Natural Airway ? ?Additional Equipment: Arterial line ? ?Intra-op Plan:  ? ?Post-operative Plan:  ? ?Informed Consent: I have reviewed the patients History and Physical, chart, labs  and discussed the procedure including the risks, benefits and alternatives for the proposed anesthesia with the patient or authorized representative who has indicated his/her understanding and acceptance.  ? ? ? ?Dental advisory given ? ?Plan Discussed with: CRNA and Surgeon ? ?Anesthesia Plan Comments: (PAT note written 09/07/2021 by Myra Gianotti, PA-C. ?)  ? ? ? ? ?Anesthesia Quick Evaluation ? ?

## 2021-09-07 NOTE — Progress Notes (Addendum)
Anesthesia Chart Review: ? Case: [id number] Date/Time: 09/08/21 1200  ? Procedures:  ?    Transcatheter Aortic Valve Replacement, Transfemoral ?    INTRAOPERATIVE TRANSTHORACIC ECHOCARDIOGRAM  ? Anesthesia type: Monitor Anesthesia Care  ? Pre-op diagnosis: Severe Aortic Stenosis  ? Location: [facility] / [facility]  ? Providers: [name], MD  ? ?  ?CT Surgeon: Gilford Raid, MD  ? ?DISCUSSION: Patient is a 76 year old female scheduled for the above procedure. ? ?History includes former smoker (quit 04/05/99), CAD (non-obstructive 03/2021), murmur (severe AS, mild AI, moderate-severe MR, mild MS 06/22/21), paroxsymal a-flutter/SVT ("SVT ablation" 04/27/03), chronic diastolic CHF, HOCM, HLD. ? ?09/04/2021 presurgical COVID-19 test negative.  09/04/2021 presurgical chest x-ray is still in process. ? ?Anesthesia team to evaluate on the day of surgery.  She reported last Eliquis is 09/02/2021. ? ? ?VS: BP (!) 100/52   Pulse 79   Temp 36.4 ?C (Oral)   Resp 17   Ht '5\' 1"'$  (1.549 m)   Wt 55.5 kg   SpO2 99%   BMI 23.13 kg/m?  ? ? ?PROVIDERS: ?[name], MD is PCP ?[name], MD is cardiologist ? ? ?LABS: Labs reviewed: Acceptable for surgery. ?(all labs ordered are listed, but only abnormal results are displayed) ? ?Labs Reviewed  ?SURGICAL PCR SCREEN - Abnormal; Notable for the following components:  ?    Result Value  ? Staphylococcus aureus POSITIVE (*)   ? All other components within normal limits  ?URINALYSIS, ROUTINE W REFLEX MICROSCOPIC - Abnormal; Notable for the following components:  ? APPearance HAZY (*)   ? Hgb urine dipstick SMALL (*)   ? Leukocytes,Ua TRACE (*)   ? Bacteria, UA RARE (*)   ? All other components within normal limits  ?SARS CORONAVIRUS 2 (TAT 6-24 HRS)  ?CBC  ?COMPREHENSIVE METABOLIC PANEL  ?PROTIME-INR  ?TYPE AND SCREEN  ? ? ? ?IMAGES: ?CXR 09/04/21: In process. ? ?CTA chest/abd/pelvis 04/13/21: ?IMPRESSION: ?1. Vascular findings and measurements pertinent to potential  TAVR ?procedure, as detailed. ?2. Diffuse thickening and calcification of the aortic valve, ?compatible with reported aortic stenosis. ?3. Mild cardiomegaly. ?4. Marked colonic diverticulosis. ?5. Diffuse osteopenia. Chronic multilevel vertebral compression ?fractures throughout the thoracic and lumbar spine. ?6. Aortic Atherosclerosis (ICD10-I70.0) and Emphysema (ICD10-J43.9). ?  ? ?EKG: 09/04/21: ?Normal sinus rhythm ?Possible Left atrial enlargement ?Left ventricular hypertrophy with repolarization abnormality ( R in aVL , Sokolow-Lyon , Cornell product ) ?Abnormal ECG ?Confirmed by Eleonore Chiquito ([id number]) on 09/04/2021 9:30:05 PM ? ? ?CV: ?Echo 06/22/21: ?IMPRESSIONS  ? 1. Normal LV function; severe basal septal hypertrophy with SAM and LVOT  ?gradient of 3.5 m/s with valsalva c/w HOCM; severe AS (mean gradient 54  ?mmHg; peak velocity 4.7 m/s; DI 0.23); mild AI; mild MS (MVA 1.9 cm2);  ?moderate to severe MR. Aortic  ?stenosis worse compared to previous.  ? 2. Left ventricular ejection fraction, by estimation, is 60 to 65%. The  ?left ventricle has normal function. The left ventricle has no regional  ?wall motion abnormalities. There is severe asymmetric left ventricular  ?hypertrophy of the basal-septal  ?segment. Left ventricular diastolic parameters are indeterminate. Elevated  ?left atrial pressure.  ? 3. Right ventricular systolic function is normal. The right ventricular  ?size is normal.  ? 4. Left atrial size was moderately dilated.  ? 5. The mitral valve is normal in structure. Moderate to severe mitral  ?valve regurgitation. Mild mitral stenosis. Severe mitral annular  ?calcification.  ? 6. The  aortic valve is calcified. Aortic valve regurgitation is mild.  ?Severe aortic valve stenosis.  ? 7. The inferior vena cava is normal in size with greater than 50%  ?respiratory variability, suggesting right atrial pressure of 3 mmHg.  ? ? ?RHC/LHC 04/22/21: ?  Mid RCA lesion is 50% stenosed. ?  Hemodynamic  findings consistent with mild pulmonary hypertension. ?  There is severe aortic valve stenosis. ?  ?1.  Calcified but nonobstructive coronary artery disease with patency of the left main, LAD, left circumflex, ramus intermedius, and right coronary arteries ?2.  Heavy mitral annular calcification with known moderate to severe mitral regurgitation and mild mitral stenosis by echo assessment ?3.  Severe aortic stenosis with mean transvalvular gradient 27 mmHg and calculated aortic valve area 0.97 cm? (probable combination of valvular and subvalvular stenosis) ?4.  Mild pulmonary hypertension with mean pulmonary artery pressure 28 mmHg, transpulmonary gradient 9 mmHg, V wave 24 mmHg. ?  ?Continued evaluation of valvular heart disease with consideration of cardiac surgery ? ? ?CT Coronary 04/13/21: ?IMPRESSION: ?1. Severe Aortic stenosis. Findings pertinent to TAVR procedure are ?detailed above. ?2. Patient's total coronary artery calcium score is 3340, which is ?99th percentile for subjects of the same age, gender, and race based ?populations. ?3.  Aortic atherosclerosis. ?  ? ?Long term event monitor ZioAT 10/09/18-10/19/18: ?Sinus bradycardia to sinus tachycardia. ?Very short runs of atrial tachycardia and nsVT. ?No atrial fibrillation. ?  ?Sinus bradycardia to sinus tachycardia. Very short runs of atrial tachycardia and nsVT. No atrial fibrillation. ?Continue the same dose of metoprolol. ?  ? ?US Carotid 04/04/06: ?IMPRESSION  ?1. 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 Thyroid ultrasound is recommended for complete  ?evaluation. If this is solitary or dominant, it will require biopsy.  ?2. Right carotid plaque formation, as described above, without significant  ?luminal narrowing.  ?3.  No significant plaque formation is seen on the left. ? ? ?Past Medical History:  ?Diagnosis Date  ? Aortic stenosis   ? Coronary artery disease   ? a. Prior LHC (07/24/03): LMCA heavily calcified without significant stenosis, LAD with heavy  calcification proximally. 50-60% stenosis just distal to D2, LCx calcified proximally without significant stenosis, Dominant RCA with moderate proximal calcification and mild luminal irregularities throughout, LVEDP 12, LVEF 70%  ? Diastolic dysfunction without heart failure   ? Former tobacco use   ? Heart murmur   ? HOCM (hypertrophic obstructive cardiomyopathy) ([place])   ? Mild AI (aortic insufficiency)   ? Osteoporosis, unspecified   ? Other and unspecified hyperlipidemia   ? Paroxysmal atrial flutter ([place])   ? Pneumonia   ? Wears hearing aid   ? left  ? ? ?Past Surgical History:  ?Procedure Laterality Date  ? ATRIAL ABLATION SURGERY  04/27/2003  ? BREAST SURGERY  04/26/1980  ? Benign tumor removed RT  ? BUNIONECTOMY  04/07/2012  ? Procedure: BUNIONECTOMY;  Surgeon: Alta Corning, MD;  Location: [facility];  Service: Orthopedics;  Laterality: Left;  CHEVRON OSTEOTOMY LEFT FOOT   ? CARDIAC CATHETERIZATION  04/27/2003  ? EYE SURGERY    ? bilateral cataracts  ? GANGLION CYST EXCISION    ? left wrist  ? HERNIA REPAIR  04/26/1988  ? rt ing  ? RIGHT/LEFT HEART CATH AND CORONARY ANGIOGRAPHY N/A 04/22/2021  ? Procedure: RIGHT/LEFT HEART CATH AND CORONARY ANGIOGRAPHY;  Surgeon: [name], MD;  Location: [facility];  Service: Cardiovascular;  Laterality: N/A;  ? TONSILLECTOMY    ? TUBAL  LIGATION    ? ? ?MEDICATIONS: ? benzonatate (TESSALON) 200 MG capsule  ? Cholecalciferol (VITAMIN D3) 1000 UNITS CAPS  ? denosumab (PROLIA) 60 MG/ML SOSY injection  ? ELIQUIS 5 MG TABS tablet  ? metoprolol succinate (TOPROL-XL) 50 MG 24 hr tablet  ? Polyethyl Glycol-Propyl Glycol (SYSTANE OP)  ? rosuvastatin (CRESTOR) 20 MG tablet  ? vitamin B-12 (CYANOCOBALAMIN) 1000 MCG tablet  ? ?No current facility-administered medications for this encounter.  ? ? [START ON 09/08/2021] ceFAZolin (ANCEF) IVPB 2g/100 mL premix  ? [START ON 09/08/2021] dexmedetomidine (PRECEDEX) 400 MCG/100ML (4 mcg/mL) infusion  ? [START ON  09/08/2021] heparin 30,000 units/NS 1000 mL solution for CELLSAVER  ? [START ON 09/08/2021] magnesium sulfate (IV Push/IM) injection 40 mEq  ? [START ON 09/08/2021] norepinephrine (LEVOPHED) '4mg'$  in 213m (0.016 mg/mL) premix in

## 2021-09-08 ENCOUNTER — Encounter (HOSPITAL_COMMUNITY): Payer: Self-pay | Admitting: Cardiovascular Disease

## 2021-09-08 ENCOUNTER — Inpatient Hospital Stay (HOSPITAL_COMMUNITY): Payer: Medicare HMO | Admitting: Vascular Surgery

## 2021-09-08 ENCOUNTER — Encounter (HOSPITAL_COMMUNITY)
Admission: RE | Disposition: A | Discharge status: Home / Self Care | Payer: Medicare HMO | Source: Home / Self Care | Attending: Cardiovascular Disease

## 2021-09-08 ENCOUNTER — Inpatient Hospital Stay (HOSPITAL_COMMUNITY)
Admission: RE | Admit: 2021-09-08 | Discharge: 2021-09-09 | DRG: 267 | Disposition: A | Discharge status: Home / Self Care | Payer: Medicare HMO | Attending: Cardiovascular Disease | Admitting: Cardiovascular Disease

## 2021-09-08 ENCOUNTER — Other Ambulatory Visit: Payer: Self-pay

## 2021-09-08 ENCOUNTER — Other Ambulatory Visit: Payer: Self-pay | Admitting: Physician Assistant

## 2021-09-08 ENCOUNTER — Inpatient Hospital Stay (HOSPITAL_COMMUNITY): Payer: Medicare HMO | Admitting: Certified Registered Nurse Anesthetist

## 2021-09-08 ENCOUNTER — Inpatient Hospital Stay (HOSPITAL_COMMUNITY)
Admission: RE | Admit: 2021-09-08 | Discharge: 2021-09-08 | Disposition: A | Discharge status: Home / Self Care | Payer: Medicare HMO | Source: Ambulatory Visit | Attending: Cardiovascular Disease | Admitting: Cardiovascular Disease

## 2021-09-08 DIAGNOSIS — Z7901 Long term (current) use of anticoagulants: Secondary | ICD-10-CM | POA: Diagnosis not present

## 2021-09-08 DIAGNOSIS — M810 Age-related osteoporosis without current pathological fracture: Secondary | ICD-10-CM | POA: Diagnosis not present

## 2021-09-08 DIAGNOSIS — K219 Gastro-esophageal reflux disease without esophagitis: Secondary | ICD-10-CM | POA: Diagnosis present

## 2021-09-08 DIAGNOSIS — E785 Hyperlipidemia, unspecified: Secondary | ICD-10-CM | POA: Diagnosis present

## 2021-09-08 DIAGNOSIS — I480 Paroxysmal atrial fibrillation: Secondary | ICD-10-CM | POA: Diagnosis present

## 2021-09-08 DIAGNOSIS — Z91040 Latex allergy status: Secondary | ICD-10-CM | POA: Diagnosis not present

## 2021-09-08 DIAGNOSIS — Z8249 Family history of ischemic heart disease and other diseases of the circulatory system: Secondary | ICD-10-CM

## 2021-09-08 DIAGNOSIS — I4891 Unspecified atrial fibrillation: Secondary | ICD-10-CM | POA: Diagnosis not present

## 2021-09-08 DIAGNOSIS — I10 Essential (primary) hypertension: Secondary | ICD-10-CM | POA: Diagnosis present

## 2021-09-08 DIAGNOSIS — Z885 Allergy status to narcotic agent status: Secondary | ICD-10-CM | POA: Diagnosis not present

## 2021-09-08 DIAGNOSIS — Z8701 Personal history of pneumonia (recurrent): Secondary | ICD-10-CM | POA: Diagnosis not present

## 2021-09-08 DIAGNOSIS — I2510 Atherosclerotic heart disease of native coronary artery without angina pectoris: Secondary | ICD-10-CM | POA: Diagnosis not present

## 2021-09-08 DIAGNOSIS — Z9851 Tubal ligation status: Secondary | ICD-10-CM

## 2021-09-08 DIAGNOSIS — Z952 Presence of prosthetic heart valve: Secondary | ICD-10-CM

## 2021-09-08 DIAGNOSIS — I421 Obstructive hypertrophic cardiomyopathy: Secondary | ICD-10-CM | POA: Diagnosis not present

## 2021-09-08 DIAGNOSIS — I700 Atherosclerosis of aorta: Secondary | ICD-10-CM | POA: Diagnosis present

## 2021-09-08 DIAGNOSIS — Z006 Encounter for examination for normal comparison and control in clinical research program: Secondary | ICD-10-CM | POA: Diagnosis not present

## 2021-09-08 DIAGNOSIS — Z974 Presence of external hearing-aid: Secondary | ICD-10-CM | POA: Diagnosis not present

## 2021-09-08 DIAGNOSIS — I447 Left bundle-branch block, unspecified: Secondary | ICD-10-CM | POA: Diagnosis not present

## 2021-09-08 DIAGNOSIS — I350 Nonrheumatic aortic (valve) stenosis: Secondary | ICD-10-CM

## 2021-09-08 DIAGNOSIS — I422 Other hypertrophic cardiomyopathy: Secondary | ICD-10-CM | POA: Diagnosis present

## 2021-09-08 DIAGNOSIS — I4892 Unspecified atrial flutter: Secondary | ICD-10-CM | POA: Diagnosis present

## 2021-09-08 DIAGNOSIS — Z79899 Other long term (current) drug therapy: Secondary | ICD-10-CM | POA: Diagnosis not present

## 2021-09-08 DIAGNOSIS — Z87891 Personal history of nicotine dependence: Secondary | ICD-10-CM

## 2021-09-08 DIAGNOSIS — Z888 Allergy status to other drugs, medicaments and biological substances status: Secondary | ICD-10-CM

## 2021-09-08 DIAGNOSIS — I080 Rheumatic disorders of both mitral and aortic valves: Secondary | ICD-10-CM | POA: Diagnosis not present

## 2021-09-08 DIAGNOSIS — I059 Rheumatic mitral valve disease, unspecified: Secondary | ICD-10-CM

## 2021-09-08 HISTORY — DX: Presence of prosthetic heart valve: Z95.2

## 2021-09-08 HISTORY — PX: TRANSCATHETER AORTIC VALVE REPLACEMENT, TRANSFEMORAL: SHX6400

## 2021-09-08 HISTORY — DX: Nonrheumatic aortic (valve) stenosis: I35.0

## 2021-09-08 HISTORY — DX: Headache, unspecified: R51.9

## 2021-09-08 HISTORY — PX: INTRAOPERATIVE TRANSTHORACIC ECHOCARDIOGRAM: SHX6523

## 2021-09-08 LAB — ECHOCARDIOGRAM LIMITED
AR max vel: 2.96 cm2
AV Area VTI: 3.16 cm2
AV Area mean vel: 2.96 cm2
AV Mean grad: 7 mmHg
AV Peak grad: 13.1 mmHg
Ao pk vel: 1.81 m/s
MV M vel: 7.34 m/s
MV Peak grad: 215.5 mmHg
P 1/2 time: 241 msec
Radius: 0.5 cm

## 2021-09-08 LAB — POCT I-STAT, CHEM 8
BUN: 6 mg/dL — ABNORMAL LOW (ref 8–23)
BUN: 6 mg/dL — ABNORMAL LOW (ref 8–23)
Calcium, Ion: 1.26 mmol/L (ref 1.15–1.40)
Calcium, Ion: 1.3 mmol/L (ref 1.15–1.40)
Chloride: 103 mmol/L (ref 98–111)
Chloride: 104 mmol/L (ref 98–111)
Creatinine, Ser: 0.5 mg/dL (ref 0.44–1.00)
Creatinine, Ser: 0.4 mg/dL — ABNORMAL LOW (ref 0.44–1.00)
Glucose, Bld: 83 mg/dL (ref 70–99)
Glucose, Bld: 114 mg/dL — ABNORMAL HIGH (ref 70–99)
HCT: 35 % — ABNORMAL LOW (ref 36.0–46.0)
HCT: 33 % — ABNORMAL LOW (ref 36.0–46.0)
Hemoglobin: 11.9 g/dL — ABNORMAL LOW (ref 12.0–15.0)
Hemoglobin: 11.2 g/dL — ABNORMAL LOW (ref 12.0–15.0)
Potassium: 3.9 mmol/L (ref 3.5–5.1)
Potassium: 4.5 mmol/L (ref 3.5–5.1)
Sodium: 139 mmol/L (ref 135–145)
Sodium: 139 mmol/L (ref 135–145)
TCO2: 27 mmol/L (ref 22–32)
TCO2: 27 mmol/L (ref 22–32)

## 2021-09-08 LAB — ABO/RH: ABO/RH(D): O NEG

## 2021-09-08 LAB — POCT ACTIVATED CLOTTING TIME: Activated Clotting Time: 266 seconds

## 2021-09-08 SURGERY — IMPLANTATION, AORTIC VALVE, TRANSCATHETER, FEMORAL APPROACH
Anesthesia: Monitor Anesthesia Care

## 2021-09-08 MED ORDER — HEPARIN (PORCINE) IN NACL 1000-0.9 UT/500ML-% IV SOLN
INTRAVENOUS | Status: DC | PRN
  Administered 2021-09-08 (×2): 500 mL

## 2021-09-08 MED ORDER — NITROGLYCERIN IN D5W 200-5 MCG/ML-% IV SOLN: 0.0000 ug/min | INTRAVENOUS | Status: DC

## 2021-09-08 MED ORDER — ONDANSETRON HCL 4 MG/2ML IJ SOLN
INTRAMUSCULAR | Status: DC | PRN
  Administered 2021-09-08: 4 mg via INTRAVENOUS

## 2021-09-08 MED ORDER — SODIUM CHLORIDE 0.9% FLUSH
3.0000 mL | INTRAVENOUS | Status: DC | PRN
Start: 2021-09-09 — End: 2021-09-09

## 2021-09-08 MED ORDER — HEPARIN SODIUM (PORCINE) 1000 UNIT/ML IJ SOLN
INTRAMUSCULAR | Status: DC | PRN
  Administered 2021-09-08: 8000 [IU] via INTRAVENOUS

## 2021-09-08 MED ORDER — LIDOCAINE HCL (PF) 1 % IJ SOLN
INTRAMUSCULAR | Status: DC | PRN
  Administered 2021-09-08: 15 mL

## 2021-09-08 MED ORDER — TRAMADOL HCL 50 MG PO TABS: 50.0000 mg | ORAL_TABLET | ORAL | Status: DC | PRN

## 2021-09-08 MED ORDER — CHLORHEXIDINE GLUCONATE 4 % EX LIQD
60.0000 mL | Freq: Once | CUTANEOUS | Status: DC
  Filled 2021-09-08: qty 60

## 2021-09-08 MED ORDER — CHLORHEXIDINE GLUCONATE 4 % EX LIQD
30.0000 mL | CUTANEOUS | Status: DC
  Filled 2021-09-08: qty 30

## 2021-09-08 MED ORDER — ACETAMINOPHEN 500 MG PO TABS: 1000.0000 mg | ORAL_TABLET | Freq: Once | ORAL | Status: DC

## 2021-09-08 MED ORDER — CEFAZOLIN SODIUM-DEXTROSE 2-4 GM/100ML-% IV SOLN
2.0000 g | Freq: Three times a day (TID) | INTRAVENOUS | Status: AC
  Administered 2021-09-08 – 2021-09-09 (×2): 2 g via INTRAVENOUS
  Filled 2021-09-08 (×2): qty 100

## 2021-09-08 MED ORDER — ACETAMINOPHEN 650 MG RE SUPP
650.0000 mg | Freq: Four times a day (QID) | RECTAL | Status: DC | PRN
Start: 2021-09-08 — End: 2021-09-09

## 2021-09-08 MED ORDER — PROPOFOL 10 MG/ML IV BOLUS
INTRAVENOUS | Status: DC | PRN
  Administered 2021-09-08: 20 mg via INTRAVENOUS
  Administered 2021-09-08 (×2): 10 mg via INTRAVENOUS

## 2021-09-08 MED ORDER — ACETAMINOPHEN 325 MG PO TABS: 650.0000 mg | ORAL_TABLET | Freq: Four times a day (QID) | ORAL | Status: DC | PRN

## 2021-09-08 MED ORDER — PHENYLEPHRINE 80 MCG/ML (10ML) SYRINGE FOR IV PUSH (FOR BLOOD PRESSURE SUPPORT)
PREFILLED_SYRINGE | INTRAVENOUS | Status: DC | PRN
Start: 2021-09-08 — End: 2021-09-08
  Administered 2021-09-08: 40 ug via INTRAVENOUS

## 2021-09-08 MED ORDER — OXYCODONE HCL 5 MG PO TABS: 5.0000 mg | ORAL_TABLET | ORAL | Status: DC | PRN

## 2021-09-08 MED ORDER — IOHEXOL 350 MG/ML SOLN
INTRAVENOUS | Status: DC | PRN
  Administered 2021-09-08: 100 mL

## 2021-09-08 MED ORDER — HYDRALAZINE HCL 20 MG/ML IJ SOLN
INTRAMUSCULAR | Status: AC
Start: 2021-09-08 — End: ?
  Filled 2021-09-08: qty 1

## 2021-09-08 MED ORDER — ONDANSETRON HCL 4 MG/2ML IJ SOLN: 4.0000 mg | Freq: Four times a day (QID) | INTRAMUSCULAR | Status: DC | PRN

## 2021-09-08 MED ORDER — MORPHINE SULFATE (PF) 2 MG/ML IV SOLN: 1.0000 mg | INTRAVENOUS | Status: DC | PRN

## 2021-09-08 MED ORDER — SODIUM CHLORIDE 0.9% FLUSH
3.0000 mL | Freq: Two times a day (BID) | INTRAVENOUS | Status: DC
  Administered 2021-09-09: 3 mL via INTRAVENOUS

## 2021-09-08 MED ORDER — HEPARIN (PORCINE) IN NACL 1000-0.9 UT/500ML-% IV SOLN
INTRAVENOUS | Status: AC
  Filled 2021-09-08: qty 500

## 2021-09-08 MED ORDER — HYDRALAZINE HCL 20 MG/ML IJ SOLN
10.0000 mg | INTRAMUSCULAR | Status: DC | PRN
Start: 2021-09-08 — End: 2021-09-09
  Administered 2021-09-08: 10 mg via INTRAVENOUS

## 2021-09-08 MED ORDER — CHLORHEXIDINE GLUCONATE 0.12 % MT SOLN
15.0000 mL | Freq: Once | OROMUCOSAL | Status: AC
  Administered 2021-09-08: 15 mL via OROMUCOSAL
  Filled 2021-09-08 (×2): qty 15

## 2021-09-08 MED ORDER — AMLODIPINE BESYLATE 5 MG PO TABS
5.0000 mg | ORAL_TABLET | Freq: Once | ORAL | Status: DC
  Filled 2021-09-08: qty 1

## 2021-09-08 MED ORDER — SODIUM CHLORIDE 0.9 % IV SOLN: INTRAVENOUS | Status: DC

## 2021-09-08 MED ORDER — SODIUM CHLORIDE 0.9 % IV SOLN
INTRAVENOUS | Status: AC
Start: 2021-09-08 — End: 2021-09-09

## 2021-09-08 MED ORDER — FENTANYL CITRATE (PF) 100 MCG/2ML IJ SOLN
INTRAMUSCULAR | Status: DC | PRN
  Administered 2021-09-08 (×2): 25 ug via INTRAVENOUS

## 2021-09-08 MED ORDER — ROSUVASTATIN CALCIUM 20 MG PO TABS
20.0000 mg | ORAL_TABLET | Freq: Every day | ORAL | Status: DC
  Administered 2021-09-08 – 2021-09-09 (×2): 20 mg via ORAL
  Filled 2021-09-08 (×2): qty 1

## 2021-09-08 MED ORDER — PROPOFOL 500 MG/50ML IV EMUL
INTRAVENOUS | Status: DC | PRN
  Administered 2021-09-08: 20 ug/kg/min via INTRAVENOUS

## 2021-09-08 MED ORDER — SODIUM CHLORIDE 0.9 % IV SOLN
250.0000 mL | INTRAVENOUS | Status: DC | PRN
  Administered 2021-09-09: 250 mL via INTRAVENOUS

## 2021-09-08 MED ORDER — AMLODIPINE BESYLATE 5 MG PO TABS
ORAL_TABLET | ORAL | Status: AC
  Filled 2021-09-08: qty 1

## 2021-09-08 MED ORDER — PROTAMINE SULFATE 10 MG/ML IV SOLN
INTRAVENOUS | Status: DC | PRN
  Administered 2021-09-08: 80 mg via INTRAVENOUS

## 2021-09-08 MED ORDER — LACTATED RINGERS IV SOLN: INTRAVENOUS | Status: DC

## 2021-09-08 MED ORDER — CHLORHEXIDINE GLUCONATE 4 % EX LIQD
60.0000 mL | Freq: Once | CUTANEOUS | Status: DC
  Administered 2021-09-08: 4 via TOPICAL
  Filled 2021-09-08: qty 60

## 2021-09-08 SURGICAL SUPPLY — 34 items
BAG SNAP BAND KOVER 36X36 (MISCELLANEOUS) ×6 IMPLANT
BALLN TRUE 22X4.5 (BALLOONS) ×2
BALLOON TRUE 22X4.5 (BALLOONS) IMPLANT
CABLE ADAPT PACING TEMP 12FT (ADAPTER) ×1 IMPLANT
CATH ANGIO 5F PIGTAIL 100CM (CATHETERS) ×1 IMPLANT
CATH DIAG 6FR PIGTAIL ANGLED (CATHETERS) ×2 IMPLANT
CATH INFINITI 6F AL1 (CATHETERS) ×1 IMPLANT
CATH S G BIP PACING (CATHETERS) ×1 IMPLANT
CLOSURE MYNX CONTROL 6F/7F (Vascular Products) ×1 IMPLANT
CLOSURE PERCLOSE PROSTYLE (VASCULAR PRODUCTS) ×2 IMPLANT
DRYSEAL FLEXSHEATH 16FR 33CM (SHEATH) ×1
ELECT DEFIB PAD ADLT CADENCE (PAD) ×1 IMPLANT
GUIDEWIRE CNFDA BRKR CVD (WIRE) ×1 IMPLANT
GUIDEWIRE SAF TJ AMPL .035X180 (WIRE) ×1 IMPLANT
KIT HEART LEFT (KITS) ×3 IMPLANT
KIT MICROPUNCTURE NIT STIFF (SHEATH) ×2 IMPLANT
PACK CARDIAC CATHETERIZATION (CUSTOM PROCEDURE TRAY) ×3 IMPLANT
SHEATH BRITE TIP 7FR 35CM (SHEATH) ×1 IMPLANT
SHEATH DRYSEAL FLEX 16FR 33CM (SHEATH) IMPLANT
SHEATH PINNACLE 6F 10CM (SHEATH) ×1 IMPLANT
SHEATH PINNACLE 8F 10CM (SHEATH) ×1 IMPLANT
SHEATH PROBE COVER 6X72 (BAG) ×1 IMPLANT
SHIELD RADPAD SCOOP 12X17 (MISCELLANEOUS) ×1 IMPLANT
SLEEVE REPOSITIONING LENGTH 30 (MISCELLANEOUS) ×1 IMPLANT
STOPCOCK MORSE 400PSI 3WAY (MISCELLANEOUS) ×6 IMPLANT
SYS EVOLUT FX DELIVERY 23-29 (CATHETERS) ×2
SYS EVOLUT FX LOADING 23-29 (CATHETERS) ×2
SYSTEM EVOLUT FX DELIVRY 23-29 (CATHETERS) IMPLANT
SYSTEM EVOLUT FX LOADING 23-29 (CATHETERS) IMPLANT
TRANSDUCER W/STOPCOCK (MISCELLANEOUS) ×6 IMPLANT
VALVE EVOLUT FX 26 (Valve) ×1 IMPLANT
WIRE EMERALD 3MM-J .035X150CM (WIRE) ×1 IMPLANT
WIRE EMERALD 3MM-J .035X260CM (WIRE) ×1 IMPLANT
WIRE EMERALD ST .035X260CM (WIRE) ×1 IMPLANT

## 2021-09-08 NOTE — Progress Notes (Signed)
?  [facility]   ?MULTIDISCIPLINARY HEART VALVE TEAM ? ?Patient doing well s/p TAVR- spoke with Elmyra Ricks by phone. She is hemodynamically stable.but hypertensive. WIll give PRN hydralazine and norvasc '5mg'$  POx 1.. Groin sites stable. ECG with sinus brady and new LBBB but no high grade block. A Plan to DC arterial line and transfer to 4E. Plan for early ambulation after bedrest completed and hopeful discharge over the next 24-48 hours.  ? ?Angelena Form PA-C  [facility]  ?Pager [phone] ? ?

## 2021-09-08 NOTE — Op Note (Signed)
?[facility]   ?MULTIDISCIPLINARY HEART VALVE TEAM ? ? ?TAVR OPERATIVE NOTE ? ? ? ?[facility] ?[id number] ? ? ?Date of Procedure:  09/08/2021 ? ?Preoperative Diagnosis: Severe Aortic Stenosis  ? ?Postoperative Diagnosis: Same  ? ?Procedure:  ? ?Transcatheter Aortic Valve Replacement - Percutaneous Right Transfemoral Approach ? Medtronic Evolut FX (size 26 mm,  serial # [id number]) ?  ?Co-Surgeons:  Gaye Pollack, MD and [name], MD ? ? ?Anesthesiologist:  Annye Asa, MD ? ?Echocardiographer:  Sanda Klein, MD ? ?Pre-operative Echo Findings: ?Severe aortic stenosis ?Normal left ventricular systolic function ? ?Post-operative Echo Findings: ?Trace paravalvular leak ?Normal left ventricular systolic function ? ? ?BRIEF CLINICAL NOTE AND INDICATIONS FOR SURGERY ? ?This 76 year old woman has stage D, severe, symptomatic aortic stenosis with New York Heart Association class II symptoms of exertional fatigue.  She developed a significant cough after her catheterization which may have been due to some congestive heart failure.  I have personally reviewed her 2D echocardiogram, cardiac catheterization, and CTA studies.  Her echocardiogram shows a severely calcified and thickened aortic valve with restricted leaflet mobility.  The mean gradient was 46 mmHg with a valve area of 0.7 cm? consistent with severe aortic stenosis.  There is also moderate aortic insufficiency and moderate to severe mitral regurgitation with a degenerative and heavily calcified mitral valve with severe mitral annular calcification.  There is mild mitral stenosi.  Left ventricular systolic function is normal with severe asymmetric hypertrophy of the septal segment.  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  I agree that aortic valve replacement is indicated in this patient for relief of her symptoms and to prevent progressive left  ventricular deterioration. 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  Her CTA of the chest shows extensive calcification of the ascending aorta and aortic arch and therefore I do not think she is a candidate for open surgical double valve replacement and septal myectomy.  I think the best option for treating her would be to perform TAVR with continued medical treatment of her mitral valve regurgitation.  Hopefully this would improve with treatment of her aortic stenosis.  Her gated cardiac CTA shows anatomy suitable for TAVR although she has a relatively small annulus.  She is at the lower limit for a 23 mm SAPIEN 3 valve and would be 23% oversized which would not be ideal with her calcified aorta.  She appears to be a candidate for a 26 mm Medtronic Evolut valve.  Her abdominal and pelvic CTA shows adequate pelvic vascular anatomy to allow transfemoral insertion. ?  ?The patient and her son and daughter were counseled at length regarding treatment alternatives for management of severe symptomatic aortic stenosis. The risks and benefits of surgical intervention has been discussed in detail. Long-term prognosis with medical therapy was discussed. Alternative approaches such as conventional surgical aortic valve replacement, transcatheter aortic valve replacement, and palliative medical therapy were compared and contrasted at length. This discussion was placed in the context of the patient's own specific clinical presentation and past medical history. All of their questions have been addressed.  ?  ?Following the decision to proceed with transcatheter aortic valve replacement, a discussion was held regarding what types of management strategies would be attempted intraoperatively in the event of life-threatening complications, including whether or not the  patient would be considered a candidate for the use of cardiopulmonary bypass and/or conversion to open sternotomy for attempted surgical intervention.  I do not think she would be a candidate for emergent sternotomy to manage any intraoperative complications given her calcified aorta. The patient is aware of the fact that transient use of cardiopulmonary bypass may be necessary. The patient has been advised of a variety of complications that might develop including but not limited to risks of death, stroke, paravalvular leak, aortic dissection or other major vascular complications, aortic annulus rupture, device embolization, cardiac rupture or perforation, mitral regurgitation, acute myocardial infarction, arrhythmia, heart block or bradycardia requiring permanent pacemaker placement, congestive heart failure, respiratory failure, renal failure, pneumonia, infection, other late complications related to structural valve deterioration or migration, or other complications that might ultimately cause a temporary or permanent loss of functional independence or other long term morbidity. The patient provides full informed consent for the procedure as described and all questions were answered.  ? ?DETAILS OF THE OPERATIVE PROCEDURE ? ?PREPARATION:   ? ?The patient is brought to the operating room on the above mentioned date and central monitoring was established by the anesthesia team including placement of a radial arterial line. The patient is placed in the supine position on the operating table.  Intravenous antibiotics are administered.  The procedure was performed under conscious sedation monitored by anesthesiology. ? ?Baseline transthoracic echocardiogram was performed. The patient's abdomen and both groins are prepared and draped in a sterile manner. A time out procedure is performed. ? ? ?PERIPHERAL ACCESS:   ? ?Using the modified Seldinger technique, femoral arterial and venous access was obtained with  placement of 6 Fr sheaths on the left side.  A pigtail diagnostic catheter was passed through the left arterial sheath under fluoroscopic guidance into the aortic root.  A temporary transvenous pacemaker catheter was passed through the left femoral venous sheath under fluoroscopic guidance into the right ventricle.  The pacemaker was tested to ensure stable lead placement and pacemaker capture.  ? ?TRANSFEMORAL ACCESS:  ? ?Percutaneous transfemoral access and sheath placement was performed using ultrasound guidance.  The right common femoral artery was cannulated using a micropuncture needle and appropriate location was verified using hand injection angiogram.  A pair of Abbott Perclose percutaneous closure devices were placed and a 8 French sheath replaced into the femoral artery.  The patient was heparinized systemically and ACT verified > 250 seconds.   ? ?A 8 Fr sheath was introduced into the right femoral artery. An AL-1 catheter was used to direct a straight-tip exchange length wire across the native aortic valve into the left ventricle. This was exchanged out for a pigtail catheter and position was confirmed in the LV apex. Simultaneous LV and Ao pressures were recorded.  The pigtail catheter was exchanged for a Confida wire in the LV apex.   ? ? ?BALLOON AORTIC VALVULOPLASTY:  ? ?Not performed ? ? ?TRANSCATHETER HEART VALVE DEPLOYMENT:  ? ?A Medtronic Evolut FX transcatheter heart valve (size 26 mm) was prepared and crimped per manufacturer's guidelines, and the proper orientation of the valve is confirmed on the Medtronic delivery system. The valve was advanced through the introducer sheath using normal technique until in an appropriate position in the abdominal aorta beyond the sheath tip. The valve was then advanced across the aortic arch. The valve was carefully positioned across the aortic valve annulus in cusp overlap position. The valve is carefully deployed using the retractor dial so that 80% of the  valve is released. The patient was turned LAO and the parallax removed from the valve. Position was satisfactory.The valve was fully released during pacing at 120 bpm. There was felt to be moderate paravalvular leak and no

## 2021-09-08 NOTE — Discharge Summary (Addendum)
?HE[facility] AND VASCULAR [facility]ENTER   ?MULTIDISCIPLINARY HEART VALVE TEAM ? ?Discharge Summary  ?  ?Patient ID: Carrie Carrie [name] ?MRN: [id number]; DOB: July 22, 1945 ? ?Admit date: 09/08/2021 ?Discharge date: 09/09/2021 ? ?Primary Care Provider: [name], MD  ?Primary Cardiologist: [name], MD /Dr. Burt Knack, MD & Dr. Cyndia Bent, MD (TAVR) ? ?Discharge Diagnoses  ?  ?Principal Problem: ?  S/P TAVR (transcatheter aortic valve replacement) ?Active Problems: ?  Hyperlipidemia ?  Severe aortic stenosis ?  HOCM (hypertrophic obstructive cardiomyopathy) ([place]) ?  Paroxysmal atrial fibrillation (HCC) ?  Essential hypertension ?  GERD without esophagitis ?  Mitral valve disease ? ?Allergies ?Allergies  ?Allergen Reactions  ? Codeine Itching and Nausea And Vomiting  ? Crestor [Rosuvastatin] Other (See Comments)  ?  Crestor 29m fatigue and exhaustion--pt tolerates crestor 264m ? Latex Rash  ? Tape Rash  ?  Please use paper tape  ? ?Diagnostic Studies/Procedures  ?  ?TAVR OPERATIVE NOTE ?  ?  ? ?Date of Procedure:                09/08/2021 ?  ?Preoperative Diagnosis:      Severe Aortic Stenosis  ?  ?Postoperative Diagnosis:    Same  ?  ?Procedure:      ?  ?Transcatheter Aortic Valve Replacement - Percutaneous Right Transfemoral Approach ?            Medtronic Evolut FX (size 26 mm,  serial # [id number]?             ?Co-Surgeons:                        BrGaye PollackMD and MiSherren MochaMD ?  ?  ?Anesthesiologist:                  CaAnnye AsaMD ?  ?Echocardiographer:              MiSanda KleinMD ?  ?Pre-operative Echo Findings: ?Severe aortic stenosis ?Normal left ventricular systolic function ?  ?Post-operative Echo Findings: ?Trace paravalvular leak ?Normal left ventricular systolic function ?_____________ ?  ?Echo 09/09/21: Completed but pending formal read at the time of discharge  ? ?History of Present Illness   ?  ?BeLallaprinkle Carrie [name] a [age].o. female with a history of moderate nonobstructive CAD,  remote SVT ablation, HOCM with LVOT obstruction, paroxysmal atrial fibrillation on Eliquis, severe MAC, mod-severe MR with mild MS and severe AS (previously stage C with normal stress echo) now progression of symptoms of fatigue [facility]  ? ?Ms. CoValtierraas initially see by Dr. CoBurt Knack/24/22. She established with Dr. McAundra Dubinn 2012 and had previously followed with Dr. CrStanford Breedhen Dr. NeMeda CoffeeShe had a cardiac catheterization in 2005 which demonstrated moderate stenosis of the mid LAD. She underwent an SVT ablation in 2005. She had mild aortic stenosis dating back approximately 10 years with a mean gradient of 15 mmHg back in 2012. At the time of her last visit with Dr. NeMeda Coffeen 04/2020 she was felt to be Carrie [name] from a cardiac perspective without symptoms therefore plan was to continue close echocardiogram surveillance every 6 months due to mild progression of her AS with a peak transvalvular velocity of 4.2 m/s and a mean gradient of 34 mmHg with calculated valve area 0.9 cm?. She then saw Dr. CoBurt Knackgain 02/2021 and was referred to Dr. BaCyndia Bentue to the  complexity of her cardiac disease however was felt to be a poor candidate for surgery due to extensive calcification in the ascending aorta and aortic arch as well as the need for complex surgery which would involve double valve replacement and septal myectomy. She was felt to be a better candidate for TAVR at that time.  ? ?She then started experiencing progressive symptoms of fatigue and upper back pain with physical activity. A repeat echocardiogram at that time confirmed worsening valvular aortic stenosis with an LVEF 60%, severe basal septal hypertrophy with SAM and LVOT gradient of 3.5 m/s with valsalva c/w HOCM. Aov mean grad 49.3 mmHg, peak grad 84.4 mmHg, AVA 0.57 cm2, DVI 0.23 moderate to severe MR with mild MS and severe MAC. Mild AR. BSA: 1.6 m2.  ? ?Cardiac catheterization performed 04/22/21 showed calcified  but nonobstructive coronary artery disease with patency of the left main, LAD, left circumflex, ramus intermedius, and right coronary arteries. Severe aortic stenosis with mean transvalvular gradient 27 mmHg and calculated aortic valve area 0.97 cm? (probable combination of valvular and subvalvular stenosis). Mild pulmonary hypertension with mean pulmonary artery pressure 28 mmHg, transpulmonary gradient 9 mmHg, V wave 24 mmHg.  ? ?She was discussed with our multidisciplinary valve team and felt to have severe, symptomatic aortic stenosis and to be a suitable candidate for TAVR, which was set up for 09/08/21.    ? ?Hospital Course  ?   ?Severe AS: s/p successful TAVR with a Medtronic Evolut FX 66m via the TF approach on 09/08/21. There was significant leak after valve deployment therefore valve was post dilated with improvement. Post operative echo pending. Groin sites are Carrie [name] without signs of hematoma. ECG with sinus tachycardia therefore Toprol was restarted today. Restarted on Eliquis this AM. Labs WNL. ?She has ambulated with CRI without issues. Post procedure care reviewed. SBE discussed and will be RX'ed at follow up next week.  ? ?Paroxysmal atrial fibrillation: Maintaining sinus rhythm/sinus tachycardia. Will restart Torpol and Eliquis this AM prior to d/c.   ? ?Hypertrophic cardiomyopathy with subvalvular stenosis: Echo with LVEF at 60-65% with  moderate concentric left ventricular hypertrophy. Continue to follow.  ? 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  ? ?Consultants: None   ? ?The patient was seen and examined by Dr. CBurt Knackwho feels that she is Carrie [name] and ready for discharge today, 09/09/21 ?_____________ ? ?Discharge Vitals ?Blood pressure 122/77, pulse (!) 107, temperature 98 ?F (36.7 ?C), temperature source Oral, resp. rate 20, height _0  (1.549 m), weight 58.6 kg,  SpO2 92 %.  ?Filed Weights  ? 09/08/21 1016 09/08/21 1801 09/09/21 0500  ?Weight: 54.4 kg 58.8 kg 58.6 kg  ? ?General: Well developed, well nourished, NAD ?Neck: Negative for carotid bruits. No JVD ?Lungs:Clear to ausculation bilaterally. No wheezes, rales, or rhonchi. Breathing is unlabored. ?Cardiovascular: RRR with S1 S2. + systolic murmur ?Abdomen: Soft, non-tender, non-distended. No obvious abdominal masses. ?Extremities: No edema. Bilateral groin sites Carrie [name] with no sign of hematoma.  ?Neuro: Alert and oriented. No focal deficits. No facial asymmetry. MAE spontaneously. ?Psych: Responds to questions appropriately with normal affect.   ? ?Labs & Radiologic Studies  ?  ?CBC ?Recent Labs  ?  09/08/21 ?1634 09/09/21 ?0235  ?WBC  --  9.9  ?HGB 11.2* 12.7  ?HCT 33.0* 37.0  ?MCV  --  91.4  ?PLT  --  217  ? ?Basic Metabolic Panel ?Recent Labs  ?  09/08/21 ?1634 09/09/21 ?0235  ?NA 139 134*  ?K 4.5 4.1  ?CL 104 104  ?CO2  --  23  ?GLUCOSE 114* 134*  ?BUN 6* 9  ?CREATININE 0.40* 0.62  ?CALCIUM  --  9.1  ?MG  --  2.0  ? ?Liver Function Tests ?No results for input(s): AST, ALT, ALKPHOS, BILITOT, PROT, ALBUMIN in the last 72 hours. ?No results for input(s): LIPASE, AMYLASE in the last 72 hours. ?Cardiac Enzymes ?No results for input(s): CKTOTAL, CKMB, CKMBINDEX, TROPONINI in the last 72 hours. ?BNP ?Invalid input(s): POCBNP ?D-Dimer ?No results for input(s): DDIMER in the last 72 hours. ?Hemoglobin A1C ?No results for input(s): HGBA1C in the last 72 hours. ?Fasting Lipid Panel ?No results for input(s): CHOL, HDL, LDLCALC, TRIG, CHOLHDL, LDLDIRECT in the last 72 hours. ?Thyroid Function Tests ?No results for input(s): TSH, T4TOTAL, T3FREE, THYROIDAB in the last 72 hours. ? ?Invalid input(s): FREET3 ?_____________  ?DG Chest 2 View ? ?Result Date: 09/07/2021 ?CLINICAL DATA:  Upcoming surgery. EXAM: CHEST - 2 VIEW COMPARISON:  August 19, 2017 FINDINGS: The cardiomediastinal silhouette is Carrie [name] with a tortuous thoracic aorta  containing calcified atherosclerosis. No pneumothorax. No nodules or masses. No focal infiltrates. A midthoracic vertebral compression fracture is Carrie [name]. No acute abnormalities. IMPRESSION: No active cardiop

## 2021-09-08 NOTE — Anesthesia Procedure Notes (Signed)
Arterial Line Insertion ?Start/End5/16/2023 12:35 PM, 09/08/2021 12:45 PM ?Performed by: Moshe Salisbury, CRNA, CRNA ? Patient location: Pre-op. ?Preanesthetic checklist: patient identified, IV checked, site marked, risks and benefits discussed, surgical consent, monitors and equipment checked, pre-op evaluation, timeout performed and anesthesia consent ?Lidocaine 1% used for infiltration ?Catheter size: 20 G ?Hand hygiene performed , maximum sterile barriers used  and Seldinger technique used ? ?Attempts: 1 ?Procedure performed without using ultrasound guided technique. ?Following insertion, dressing applied and Biopatch. ?Post procedure assessment: normal ? ?Patient tolerated the procedure well with no immediate complications. ? ? ?

## 2021-09-08 NOTE — Op Note (Signed)
?HE[facility] AND VASCULAR CENTER   ?MULTIDISCIPLINARY HEART VALVE TEAM ? ? ?TAVR OPERATIVE NOTE ? ? ?Date of Procedure:  09/08/2021  ? ?Preoperative Diagnosis: Severe Aortic Stenosis  ? ?Postoperative Diagnosis: Same  ? ?Procedure:  ? ?Transcatheter Aortic Valve Replacement - Percutaneous Transfemoral Approach ? Medtronic Evolut FX (size 26 mm, serial # [id number]) ?  ?Co-Surgeons:  Gaye Pollack, MD and [name], MD ? ?Anesthesiologist:  Annye Asa, MD ? ?Echocardiographer:  Sanda Klein, MD ? ?Pre-operative Echo Findings: ?Severe aortic stenosis ?Vigorous/hyperdynamic left ventricular systolic function ? ?Post-operative Echo Findings: ?Trace paravalvular leak ?Unchanged left ventricular systolic function ? ?BRIEF CLINICAL NOTE AND INDICATIONS FOR SURGERY ? ?76 year old woman with progressive, severe, stage D1 aortic stenosis.  Comorbid conditions include paroxysmal atrial fibrillation, hypertrophic cardiomyopathy, and severe mitral annular calcification with moderately severe mitral regurgitation.  The patient has severe aortic calcification and was deemed a poor candidate for conventional surgery.  She presents today for TAVR after undergoing multidisciplinary heart team evaluation and extensive preoperative testing. ? ?During the course of the patient's preoperative work up they have been evaluated comprehensively by a multidisciplinary team of specialists coordinated through the [facility] in the [facility].  They have been demonstrated to suffer from symptomatic severe aortic stenosis as noted above. The patient has been counseled extensively as to the relative risks and benefits of all options for the treatment of severe aortic stenosis including long term medical therapy, conventional surgery for aortic valve replacement, and transcatheter aortic valve replacement.  The patient has been independently evaluated in formal cardiac surgical consultation  by Dr Cyndia Bent, who deemed the patient appropriate for TAVR. Based upon review of all of the patient's preoperative diagnostic tests they are felt to be candidate for transcatheter aortic valve replacement using the transfemoral approach as an alternative to conventional surgery.   ? ?Following the decision to proceed with transcatheter aortic valve replacement, a discussion has been held regarding what types of management strategies would be attempted intraoperatively in the event of life-threatening complications, including whether or not the patient would be considered a candidate for the use of cardiopulmonary bypass and/or conversion to open sternotomy for attempted surgical intervention.  The patient has been advised of a variety of complications that might develop peculiar to this approach including but not limited to risks of death, stroke, paravalvular leak, aortic dissection or other major vascular complications, aortic annulus rupture, device embolization, cardiac rupture or perforation, acute myocardial infarction, arrhythmia, heart block or bradycardia requiring permanent pacemaker placement, congestive heart failure, respiratory failure, renal failure, pneumonia, infection, other late complications related to structural valve deterioration or migration, or other complications that might ultimately cause a temporary or permanent loss of functional independence or other long term morbidity.  The patient provides full informed consent for the procedure as described and all questions were answered preoperatively. ? ?DETAILS OF THE OPERATIVE PROCEDURE ? ?PREPARATION:   ?The patient is brought to the operating room on the above mentioned date and central monitoring was established by the anesthesia team including placement of a radial arterial line. The patient is placed in the supine position on the operating table.  Intravenous antibiotics are administered. The patient is monitored closely throughout the  procedure under conscious sedation. ? ?Baseline transthoracic echocardiogram is performed. The patient's chest, abdomen, both groins, and both lower extremities are prepared and draped in a sterile manner. A time out procedure is performed. ? ? ?PERIPHERAL ACCESS:   ?Using ultrasound guidance,  femoral arterial and venous access is obtained with placement of 6 Fr sheaths on the left side.  [place] images are captured and digitally stored in the patient's record. A pigtail diagnostic catheter was passed through the femoral arterial sheath under fluoroscopic guidance into the aortic root (non-coronary cusp).  A temporary transvenous pacemaker catheter was passed through the femoral venous sheath under fluoroscopic guidance into the right ventricle.  The pacemaker was tested to ensure stable lead placement and pacemaker capture.  ? ?TRANSFEMORAL ACCESS:  ?A micropuncture technique is used to access the right femoral artery under fluoroscopic and ultrasound guidance.  [place] images are captured and digitally stored in the patient's chart. 2 Perclose devices are deployed at 10' and 2' positions to 'PreClose' the femoral artery. An 8 French sheath is placed.   An AL-1 catheter was used to direct a straight-tip exchange length wire across the native aortic valve into the left ventricle. This was exchanged out for a pigtail catheter and position was confirmed in the LV apex.  Simultaneous LV and Ao pressures were recorded.  The pigtail catheter was exchanged for a Confida wire in the LV apex.   ? ?BALLOON AORTIC VALVULOPLASTY:  ?Not performed ? ?TRANSCATHETER HEART VALVE DEPLOYMENT:  ?A Medtronic Evolut FX transcatheter heart valve (size 26 mm) was prepared and crimped per manufacturer's guidelines, and the proper orientation of the valve is confirmed on the Medtronic delivery system. The valve was advanced over the Confida wire using the inline sheath until it is positioned at the base of the pigtail catheter in the aortic valve  annulus. Using the fine tuning wheel, valve deployment begins until annular contact is made. Controlled ventricular pacing is performed. Once proper position is confirmed via aortic root angiograms in the cusp overlap and LAO projections, the valve is deployed to 80% where it is fully functional. The patient's hemodynamic recovery following valve deployment is good.  Final position is confirmed and the valve is slowly deployed over 30 seconds until both paddles are released. The delivery catheter and Confida wire are removed and the valve is assessed with echocardiography. Echo demostrated acceptable post-procedural gradients, stable mitral valve function, and moderate aortic insufficiency.  Aortic root angiography is then performed and demonstrates at least moderate aortic insufficiency.  The base of the valve stent frame appears underexpanded.  The valve is recrossed with a pigtail catheter and a Confida wire is advanced into the left ventricle.  A 22 mm Bard true balloon is prepped using normal technique with positive prep.  The balloon is advanced over the wire until it is on the ventricular side of the Medtronic stent frame.  Rapid ventricular pacing is performed at 180 bpm and the balloon is inflated with good stent frame expansion.  The patient's hemodynamic recovery is rapid.  Further assessment with both angiography and echo demonstrates only trivial residual paravalvular leak.  The patient's result is felt to be adequate. ? ? ?PROCEDURE COMPLETION:  ?The sheath was removed and femoral artery closure is performed using the 2 previously deployed Perclose devices.  Protamine is administered once femoral arterial repair was complete. The site is clear with no evidence of bleeding or hematoma after the sutures are tightened. The temporary pacemaker and pigtail catheters are removed. Mynx closure is used for contralateral femoral arterial hemostasis for the 6 Fr sheath. ? ?The patient tolerated the procedure  well and is transported to the recovery area in stable condition. There were no immediate intraoperative complications. All sponge instrument and needle counts  are verified correct at completion of the operat

## 2021-09-08 NOTE — Progress Notes (Signed)
Patient arrived to 4e-17, vitals taken, patient resting comfortably, denies pain. ?

## 2021-09-08 NOTE — Interval H&P Note (Signed)
History and Physical Interval Note: ? ?09/08/2021 ?10:17 AM ? ?Merlean Sprinkle Lookabaugh  has presented today for surgery, with the diagnosis of Severe Aortic Stenosis.  The various methods of treatment have been discussed with the patient and family. After consideration of risks, benefits and other options for treatment, the patient has consented to  Procedure(s): ?Transcatheter Aortic Valve Replacement, Transfemoral (N/A) ?INTRAOPERATIVE TRANSTHORACIC ECHOCARDIOGRAM (N/A) as a surgical intervention.  The patient's history has been reviewed, patient examined, no change in status, stable for surgery.  I have reviewed the patient's chart and labs.  Questions were answered to the patient's satisfaction.   ? ? ?Gaye Pollack ? ? ?

## 2021-09-08 NOTE — Anesthesia Postprocedure Evaluation (Signed)
Anesthesia Post Note ? ?Patient: Carrie [name] ? ?Procedure(s) Performed: Transcatheter Aortic Valve Replacement, Transfemoral ?INTRAOPERATIVE TRANSTHORACIC ECHOCARDIOGRAM ? ?  ? ?Patient location during evaluation: Cath Lab ?Anesthesia Type: MAC ?Level of consciousness: awake and alert, patient cooperative and oriented ?Pain management: pain level controlled ?Vital Signs Assessment: post-procedure vital signs reviewed and stable ?Respiratory status: nonlabored ventilation, spontaneous breathing, respiratory function stable and patient connected to nasal cannula oxygen ?Cardiovascular status: blood pressure returned to baseline and stable ?Postop Assessment: no apparent nausea or vomiting ?Anesthetic complications: no ? ? ?There were no known notable events for this encounter. ? ?Last Vitals:  ?Vitals:  ? 09/08/21 1630 09/08/21 1635  ?BP: (!) 166/63 (!) 171/70  ?Pulse: (!) 50 (!) 47  ?Resp: 15 12  ?Temp:    ?SpO2: 97% 98%  ?  ?Last Pain:  ?Vitals:  ? 09/08/21 1614  ?TempSrc: Temporal  ?PainSc: 0-No pain  ? ? ?  ?  ?  ?  ?  ?  ? ?Houa Nie,E. Yamari Ventola ? ? ? ? ?

## 2021-09-08 NOTE — Anesthesia Procedure Notes (Signed)
Procedure Name: Parker ?Date/Time: 09/08/2021 2:22 PM ?Performed by: Dorthea Cove, CRNA ?Pre-anesthesia Checklist: Patient identified, Emergency Drugs available, Patient being monitored, Suction available and Timeout performed ?Patient Re-evaluated:Patient Re-evaluated prior to induction ?Oxygen Delivery Method: Simple face mask ?Preoxygenation: Pre-oxygenation with 100% oxygen ?Induction Type: IV induction ?Placement Confirmation: positive ETCO2 and CO2 detector ?Dental Injury: Teeth and Oropharynx as per pre-operative assessment  ? ? ? ? ?

## 2021-09-08 NOTE — Transfer of Care (Signed)
Immediate Anesthesia Transfer of Care Note ? ?Patient: Carrie [name] ? ?Procedure(s) Performed: Transcatheter Aortic Valve Replacement, Transfemoral ?INTRAOPERATIVE TRANSTHORACIC ECHOCARDIOGRAM ? ?Patient Location: Cath Lab ? ?Anesthesia Type:MAC ? ?Level of Consciousness: awake, drowsy and patient cooperative ? ?Airway & Oxygen Therapy: Patient Spontanous Breathing and Patient connected to nasal cannula oxygen ? ?Post-op Assessment: Report given to RN and Post -op Vital signs reviewed and stable ? ?Post vital signs: Reviewed and stable ? ?Last Vitals:  ?Vitals Value Taken Time  ?BP 131/58 09/08/21 1614  ?Temp    ?Pulse 53 09/08/21 1615  ?Resp 12 09/08/21 1615  ?SpO2 92 % 09/08/21 1615  ?Vitals shown include unvalidated device data. ? ?Last Pain:  ?Vitals:  ? 09/08/21 1039  ?TempSrc:   ?PainSc: 0-No pain  ?   ? ?Patients Stated Pain Goal: 3 (09/08/21 1039) ? ?Complications: There were no known notable events for this encounter. ?

## 2021-09-09 ENCOUNTER — Encounter (HOSPITAL_COMMUNITY): Payer: Self-pay | Admitting: Cardiovascular Disease

## 2021-09-09 ENCOUNTER — Inpatient Hospital Stay (HOSPITAL_COMMUNITY): Payer: Medicare HMO

## 2021-09-09 DIAGNOSIS — I421 Obstructive hypertrophic cardiomyopathy: Secondary | ICD-10-CM | POA: Diagnosis not present

## 2021-09-09 DIAGNOSIS — Z952 Presence of prosthetic heart valve: Secondary | ICD-10-CM | POA: Diagnosis not present

## 2021-09-09 DIAGNOSIS — Z006 Encounter for examination for normal comparison and control in clinical research program: Secondary | ICD-10-CM | POA: Diagnosis not present

## 2021-09-09 DIAGNOSIS — I700 Atherosclerosis of aorta: Secondary | ICD-10-CM | POA: Diagnosis not present

## 2021-09-09 DIAGNOSIS — I080 Rheumatic disorders of both mitral and aortic valves: Secondary | ICD-10-CM | POA: Diagnosis not present

## 2021-09-09 DIAGNOSIS — I350 Nonrheumatic aortic (valve) stenosis: Secondary | ICD-10-CM

## 2021-09-09 LAB — ECHOCARDIOGRAM COMPLETE
AR max vel: 1.57 cm2
AV Area VTI: 1.68 cm2
AV Area mean vel: 1.61 cm2
AV Mean grad: 21.5 mmHg
AV Peak grad: 43.4 mmHg
Ao pk vel: 3.3 m/s
Area-P 1/2: 3.34 cm2
Height: 61 in
MV M vel: 6.55 m/s
MV Peak grad: 171.6 mmHg
MV VTI: 2.84 cm2
Radius: 0.9 cm
S' Lateral: 2.6 cm
Weight: 2067.03 oz

## 2021-09-09 LAB — MAGNESIUM: Magnesium: 2 mg/dL (ref 1.7–2.4)

## 2021-09-09 LAB — CBC
HCT: 37 % (ref 36.0–46.0)
Hemoglobin: 12.7 g/dL (ref 12.0–15.0)
MCH: 31.4 pg (ref 26.0–34.0)
MCHC: 34.3 g/dL (ref 30.0–36.0)
MCV: 91.4 fL (ref 80.0–100.0)
Platelets: 217 10*3/uL (ref 150–400)
RBC: 4.05 MIL/uL (ref 3.87–5.11)
RDW: 13.8 % (ref 11.5–15.5)
WBC: 9.9 10*3/uL (ref 4.0–10.5)
nRBC: 0 % (ref 0.0–0.2)

## 2021-09-09 LAB — BASIC METABOLIC PANEL
Anion gap: 7 (ref 5–15)
BUN: 9 mg/dL (ref 8–23)
CO2: 23 mmol/L (ref 22–32)
Calcium: 9.1 mg/dL (ref 8.9–10.3)
Chloride: 104 mmol/L (ref 98–111)
Creatinine, Ser: 0.62 mg/dL (ref 0.44–1.00)
GFR, Estimated: >60 mL/min (ref >60)
Glucose, Bld: 134 mg/dL — ABNORMAL HIGH (ref 70–99)
Potassium: 4.1 mmol/L (ref 3.5–5.1)
Sodium: 134 mmol/L — ABNORMAL LOW (ref 135–145)

## 2021-09-09 MED ORDER — APIXABAN 5 MG PO TABS: 5.0000 mg | ORAL_TABLET | Freq: Two times a day (BID) | ORAL | Status: DC

## 2021-09-09 MED ORDER — METOPROLOL SUCCINATE ER 50 MG PO TB24
50.0000 mg | ORAL_TABLET | Freq: Every day | ORAL | Status: DC
  Administered 2021-09-09: 50 mg via ORAL
  Filled 2021-09-09: qty 1

## 2021-09-09 NOTE — Progress Notes (Signed)
CARDIAC REHAB PHASE I  ? ?PRE:  Rate/Rhythm: 28 SR with PACs ? ?  BP: sitting 111/57 ? ?  SaO2: 95 RA ? ?MODE:  Ambulation: 340 ft  ? ?POST:  Rate/Rhythm: 119 ST with PACs ? ?  BP: sitting 122/66  ? ?  SaO2: 95 RA ? ?Pt ambulated independently. HR up but denied SOB. Sts she feels well. To recliner.  ? ?Discussed restrictions, exercise, and CRPII. Will refer to [facility].  ?[phone]  ? ?Yves Dill CES, ACSM ?09/09/2021 ?11:03 AM ? ? ? ? ?

## 2021-09-09 NOTE — Progress Notes (Signed)
?  Echocardiogram ?2D Echocardiogram has been performed. ? Joella, Saefong ?09/09/2021, 8:24 AM ?

## 2021-09-09 NOTE — Progress Notes (Signed)
D/c tele and Ivs. Went over AVS with pt and all questions were addressed.  Tamma Brigandi S Brook Geraci, RN  

## 2021-09-10 ENCOUNTER — Telehealth: Payer: Self-pay | Admitting: Cardiology

## 2021-09-10 LAB — POCT ACTIVATED CLOTTING TIME
Activated Clotting Time: 149 seconds
Activated Clotting Time: 125 seconds

## 2021-09-10 NOTE — Telephone Encounter (Signed)
  Carrie [name] VALVE TEAM   Patient contacted regarding discharge from [facility] on 09/09/21   Patient understands to follow up with provider Kathyrn Drown NP-C at the [place] office.  Patient understands discharge instructions? Yes  Patient understands medications and regimen? Yes  Patient understands to bring all medications to this visit? Yes   Kathyrn Drown NP-C Structural Heart Team  Pager: [phone]

## 2021-09-14 ENCOUNTER — Telehealth (HOSPITAL_COMMUNITY): Payer: Self-pay

## 2021-09-14 NOTE — Telephone Encounter (Signed)
Per phase I cardiac rehab, fax cardiac rehab referral to [facility] cardiac rehab.

## 2021-09-15 NOTE — Progress Notes (Unsigned)
[place]                                     Cardiology Office Note:    Date:  09/16/2021   ID:  Carrie [name], DOB 05-11-1945, MRN [id number]  PCP:  [name], MD  [facility] Cardiologist:  [name], MD /Dr. Burt Knack, MD & Dr. Cyndia Bent, MD (TAVR) [facility] Electrophysiologist:  None   Referring MD: [name], MD   Chief Complaint  Patient presents with   Follow-up    S/p TAVR   History of Present Illness:    Carrie [name] is a 76 y.o. female with a hx of moderate nonobstructive CAD, remote SVT ablation, HOCM with LVOT obstruction, paroxysmal atrial fibrillation on Eliquis, severe MAC, mod-severe MR with mild MS and severe AS (previously stage C with normal stress echo) now progression of symptoms of fatigue who presents today for [facility] post TAVR follow up.    Ms. Sedivy was initially see by Dr. Burt Knack 09/16/20. She established with Dr. Aundra Dubin in 2012 and had previously followed with Dr. Stanford Breed then Dr. Meda Coffee. She had a cardiac catheterization in 2005 which demonstrated moderate stenosis of the mid LAD. She underwent an SVT ablation in 2005. She had mild aortic stenosis dating back approximately 10 years with a mean gradient of 15 mmHg back in 2012. At the time of her last visit with Dr. Meda Coffee in 04/2020 she was felt to be stable from a cardiac perspective without symptoms therefore plan was to continue close echocardiogram surveillance every 6 months due to mild progression of her AS with a peak transvalvular velocity of 4.2 m/s and a mean gradient of 34 mmHg with calculated valve area 0.9 cm. She then saw Dr. Burt Knack again 02/2021 and was referred to Dr. Cyndia Bent due to the complexity of her cardiac disease however was felt to be a poor candidate for surgery due to extensive calcification in the ascending aorta and aortic arch as well as the need for complex surgery which would involve double  valve replacement and septal myectomy. She was felt to be a better candidate for TAVR at that time.    She then started experiencing progressive symptoms of fatigue and upper back pain with physical activity. A repeat echocardiogram at that time confirmed worsening valvular aortic stenosis with an LVEF 60%, severe basal septal hypertrophy with SAM and LVOT gradient of 3.5 m/s with valsalva c/w HOCM. Aov mean grad 49.3 mmHg, peak grad 84.4 mmHg, AVA 0.57 cm2, DVI 0.23 moderate to severe MR with mild MS and severe MAC. Mild AR. BSA: 1.6 m2.    Cardiac catheterization performed 04/22/21 showed calcified but nonobstructive coronary artery disease with patency of the left main, LAD, left circumflex, ramus intermedius, and right coronary arteries. Severe aortic stenosis with mean transvalvular gradient 27 mmHg and calculated aortic valve area 0.97 cm (probable combination of valvular and subvalvular stenosis). Mild pulmonary hypertension with mean pulmonary artery pressure 28 mmHg, transpulmonary gradient 9 mmHg, V wave 24 mmHg.    She was discussed with our multidisciplinary valve team and felt to have severe, symptomatic aortic stenosis and to be a suitable candidate for TAVR, which was 09/08/21 with a Medtronic Evolut FX 66m via the TF approach. There was significant leak after valve deployment therefore valve was post dilated with improvement. Post operative echo with mean  gradient measures at 21.5 mmHg, peak gradient measures 43.67mHg and AVA by VTI measures 1.68 cm. She was restarted on Eliquis with no issues.   Today she is here with her daughter and reports she has been feeling great with no pain or SOB. She was expecting to feel something after surgery and has been very pleasantly surprised with the ease of TAVR. She denies palpitations, LE edema, orthopnea, dizziness, or syncope. Groin sites are healing well with no bleeding or hematoma. SBE discussed and RXed with Amoxicillin.   Past Medical  History:  Diagnosis Date   Coronary artery disease    a. Prior LHC (07/24/03): LMCA heavily calcified without significant stenosis, LAD with heavy calcification proximally. 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 CoBurt Knack Dr. BaCyndia Bent Severe aortic stenosis    Wears hearing aid    left    Past Surgical History:  Procedure Laterality Date   ATRIAL ABLATION SURGERY  04/27/2003   BREAST SURGERY  04/26/1980   Benign tumor removed RT   BUNIONECTOMY  04/07/2012   Procedure: BUNIONECTOMY;  Surgeon: JoAlta CorningMD;  [place] [facility] Service: [place]opedics;  Laterality: Left;  CHEVRON OSTEOTOMY LEFT FOOT    CARDIAC CATHETERIZATION  04/27/2003   EYE SURGERY     bilateral cataracts   GANGLION CYST EXCISION     left wrist   HERNIA REPAIR  04/26/1988   rt ing   INTRAOPERATIVE TRANSTHORACIC ECHOCARDIOGRAM N/A 09/08/2021   Procedure: INTRAOPERATIVE TRANSTHORACIC ECHOCARDIOGRAM;  Surgeon: CoSherren MochaMD;  [facility];  Service: Open Heart Surgery;  Laterality: N/A;   RIGHT/LEFT HEART CATH AND CORONARY ANGIOGRAPHY N/A 04/22/2021   Procedure: RIGHT/LEFT HEART CATH AND CORONARY ANGIOGRAPHY;  Surgeon: CoSherren MochaMD;  [facility];  Service: Cardiovascular;  Laterality: N/A;   TONSILLECTOMY     TRANSCATHETER AORTIC VALVE REPLACEMENT, TRANSFEMORAL N/A 09/08/2021   Procedure: Transcatheter  Aortic Valve Replacement, Transfemoral;  Surgeon: CoSherren MochaMD;  [facility] [facility];  Service: Open Heart Surgery;  Laterality: N/A;   TUBAL LIGATION      Current Medications: Current Meds  Medication Sig   benzonatate (TESSALON) 200 MG capsule Take 1 capsule (200 mg total) by mouth 3 (three) times daily as needed.   Cholecalciferol (VITAMIN D3) 1000 UNITS CAPS Take 1 capsule by mouth daily.   denosumab (PROLIA) 60 MG/ML SOSY injection Inject 60 mg into the skin every 6 (six) months.   ELIQUIS 5 MG TABS tablet Take 1 tablet (5 mg total) by mouth 2 (two) times daily.   metoprolol succinate (TOPROL-XL) 50 MG 24 hr tablet Take 1 tablet (50 mg total) by mouth daily. Take with or immediately following a meal.   Polyethyl Glycol-Propyl Glycol (SYSTANE OP) Apply 1 drop to eye 2 (two) times daily as needed (dry eyes).   rosuvastatin (CRESTOR) 20 MG tablet Take 1 tablet (20 mg total) by  mouth daily.   vitamin B-12 (CYANOCOBALAMIN) 1000 MCG tablet Take 1,000 mcg by mouth daily.     Allergies:   Codeine, Crestor [rosuvastatin], Latex, and Tape   Social History   Socioeconomic History   Marital status: Divorced    Spouse name: Not on file   Number of children: 2   Years of education: Not on file   Highest education level: Not on file  Occupational History   Occupation: retired  Tobacco Use   Smoking status: Former    Types: Cigarettes    Quit date: 04/05/1999    Years since quitting: 22.4   Smokeless tobacco: Never   Tobacco comments:    Regular Exercise-No  Vaping Use   Vaping Use: Never used  Substance and Sexual Activity   Alcohol use: Yes    Alcohol/week: 7.0 standard drinks    Types: 7 Glasses of wine per week    Comment: 1 drink per week   Drug use: No   Sexual activity: Not Currently    Birth control/protection: Post-menopausal  Other Topics Concern   Not on file  Social History Narrative   Not on file   Social Determinants of Health   Financial  Resource Strain: Not on file  Food Insecurity: No Food Insecurity   Worried About Charity fundraiser in the Last Year: Never true   Ran Out of Food in the Last Year: Never true  Transportation Needs: No Transportation Needs   Lack of Transportation (Medical): No   Lack of Transportation (Non-Medical): No  Physical Activity: Sufficiently Active   Days of Exercise per Week: 7 days   Minutes of Exercise per Session: 30 min  Stress: No Stress Concern Present   Feeling of Stress : Not at all  Social Connections: Moderately Integrated   Frequency of Communication with Friends and Family: More than three times a week   Frequency of Social Gatherings with Friends and Family: Once a week   Attends Religious Services: More than 4 times per year   Active Member of [employer] or Organizations: Yes   Attends Music therapist: More than 4 times per year   Marital Status: Divorced     Family History: The patient's family history includes Congestive Heart Failure in her mother; Dementia in her mother; Heart disease (age of onset: 50) in her mother; Hypertension in an other family member; Valvular heart disease in her father. There is no history of Colon cancer or Rectal cancer.  ROS:   Please see the history of present illness.    All other systems reviewed and are negative.  EKGs/Labs/Other Studies Reviewed:    The following studies were reviewed today:  TAVR OPERATIVE NOTE      Date of Procedure:                09/08/2021   Preoperative Diagnosis:      Severe Aortic Stenosis    Postoperative Diagnosis:    Same    Procedure:        Transcatheter Aortic Valve Replacement - Percutaneous Right Transfemoral Approach             Medtronic Evolut FX (size 26 mm,  serial # [id number])              Co-Surgeons:                        Gaye Pollack, MD and [name], MD  Anesthesiologist:                  Annye Asa, MD   Echocardiographer:              Sanda Klein, MD   Pre-operative Echo Findings: Severe aortic stenosis Normal left ventricular systolic function   Post-operative Echo Findings: Trace paravalvular leak Normal left ventricular systolic function _____________   Echo 09/09/21:   IMPRESSIONS Left ventricular ejection fraction, by estimation, is 70 to 75%. The left ventricle has hyperdynamic function. The left ventricle has no regional wall motion abnormalities. There is moderate asymmetric left ventricular hypertrophy of the basal-septal segment. Left ventricular diastolic parameters are consistent with Grade I diastolic dysfunction (impaired relaxation). 1. Right ventricular systolic function is normal. The right ventricular size is normal. There is mildly elevated pulmonary artery systolic pressure. 2. 3. Left atrial size was severely dilated. There is at most mild mitral stenosis. Mitral gradients are high due to tachycardia and severe mitral insufficiency. There is moderate to severe systolic anterior motion of the mitral valve with secondary mitral insufficiency and LV outflow obstruction. The mitral valve is degenerative. Severe mitral valve regurgitation. The mean mitral valve gradient is 8.5 mmHg with average heart rate of 105 bpm. Severe mitral annular calcification. 4. Aortic valve gradients and calculated aortic valve area are inaccurate due to subvalvular dynamic obstruction due to HOCM pathophysiology. Dimensionless valve index is consistent with normal prosthetic valve function.. The aortic valve has been repaired/replaced. Aortic valve regurgitation is not visualized. There is a 26 mm Sapien prosthetic (TAVR) valve present in the aortic position. Echo findings are consistent with normal structure and function of the aortic valve prosthesis. Aortic valve Vmax measures 3.30 m/s. 5. 6. The inferior vena cava is normal in size with greater than 50% respiratory variability,  EKG:  EKG is  ordered today.   The ekg ordered today demonstrates NSR with incomplete RBBB, known. HR 62bpm.   Recent Labs: 09/16/2020: NT-Pro BNP 1,305 09/04/2021: ALT 17 09/09/2021: BUN 9; Creatinine, Ser 0.62; Hemoglobin 12.7; Magnesium 2.0; Platelets 217; Potassium 4.1; Sodium 134   Recent Lipid Panel    Component Value Date/Time   CHOL 199 05/21/2020 1024   TRIG 175 (H) 05/21/2020 1024   HDL 78 05/21/2020 1024   CHOLHDL 2.6 05/21/2020 1024   CHOLHDL 2.3 08/18/2015 1030   VLDL 22 08/18/2015 1030   LDLCALC 92 05/21/2020 1024   LDLDIRECT 133.8 12/19/2012 0828   Physical Exam:    VS:  BP 118/60   Pulse 62   Ht _0  (1.549 m)   Wt 120 lb 3.2 oz (54.5 kg)   SpO2 97%   BMI 22.71 kg/m     Wt Readings from Last 3 Encounters:  09/16/21 120 lb 3.2 oz (54.5 kg)  09/09/21 129 lb 3 oz (58.6 kg)  09/04/21 122 lb 6.4 oz (55.5 kg)    General: Well developed, well nourished, NAD Lungs:Clear to ausculation bilaterally. No wheezes, rales, or rhonchi. Breathing is unlabored. Cardiovascular: RRR with S1 S2. No murmurs Extremities: No edema.  Neuro: Alert and oriented. No focal deficits. No facial asymmetry. MAE spontaneously. Psych: Responds to questions appropriately with normal affect.    ASSESSMENT/PLAN:    Severe AS: s/p successful TAVR with a Medtronic Evolut FX 43m via the TF approach on 09/08/21. There was significant leak after valve deployment therefore valve was post dilated with improvement. Post operative echo with mean gradient measures at 21.5 mmHg, peak gradient measures 43.429mg and AVA  by VTI measures 1.68 cm. She was restarted on Eliquis with no issues. She is doing very well post procedure with no SOB. Groin sites are healing well with no bleeding or hematoma. SBE discussed and RXed with Amoxicillin. Plan for 1 month follow up with echocardiogram   Paroxysmal atrial fibrillation: Maintaining sinus rhythm per EKG today. Continue Toprol and Eliquis.    Hypertrophic cardiomyopathy with subvalvular  stenosis: Echo with LVEF at 60-65% with moderate concentric left ventricular hypertrophy. Continue to follow.    Severe mitral annular calcification with at least moderate mitral regurgitation: Severe mitral annular calcification with moderate to severe mitral valve regurgitation. Hopeful this will improve some with TAVR. Follow one month echo.    Medication Adjustments/Labs and Tests Ordered: Current medicines are reviewed at length with the patient today.  Concerns regarding medicines are outlined above.  Orders Placed This Encounter  Procedures   EKG 12-Lead   No orders of the defined types were placed in this encounter.   Patient Instructions  Medication Instructions:  Your physician recommends that you continue on your current medications as directed. Please refer to the Current Medication list given to you today.  *If you need a refill on your cardiac medications before your next appointment, please call your pharmacy*   Lab Work: None ordered   If you have labs (blood work) drawn today and your tests are completely normal, you will receive your results only by: [place] (if you have MyChart) OR A paper copy in the mail If you have any lab test that is abnormal or we need to change your treatment, we will call you to review the results.   Testing/Procedures: None ordered    Follow-Up: Follow up as scheduled   :1}    Other Instructions   Important Information About Sugar         Signed, Kathyrn Drown, NP  09/16/2021 10:13 AM    [place]

## 2021-09-16 ENCOUNTER — Ambulatory Visit: Payer: Medicare HMO | Admitting: Cardiology

## 2021-09-16 VITALS — BP 118/60 | HR 62 | Ht 61.0 in | Wt 120.2 lb

## 2021-09-16 DIAGNOSIS — I480 Paroxysmal atrial fibrillation: Secondary | ICD-10-CM | POA: Diagnosis not present

## 2021-09-16 DIAGNOSIS — I2510 Atherosclerotic heart disease of native coronary artery without angina pectoris: Secondary | ICD-10-CM | POA: Diagnosis not present

## 2021-09-16 DIAGNOSIS — E785 Hyperlipidemia, unspecified: Secondary | ICD-10-CM | POA: Diagnosis not present

## 2021-09-16 DIAGNOSIS — I350 Nonrheumatic aortic (valve) stenosis: Secondary | ICD-10-CM | POA: Diagnosis not present

## 2021-09-16 DIAGNOSIS — I340 Nonrheumatic mitral (valve) insufficiency: Secondary | ICD-10-CM

## 2021-09-16 DIAGNOSIS — I421 Obstructive hypertrophic cardiomyopathy: Secondary | ICD-10-CM | POA: Diagnosis not present

## 2021-09-16 DIAGNOSIS — Z952 Presence of prosthetic heart valve: Secondary | ICD-10-CM | POA: Diagnosis not present

## 2021-09-16 NOTE — Patient Instructions (Signed)
Medication Instructions:  ?Your physician recommends that you continue on your current medications as directed. Please refer to the Current Medication list given to you today. ? ?*If you need a refill on your cardiac medications before your next appointment, please call your pharmacy* ? ? ?Lab Work: ?None ordered  ? ?If you have labs (blood work) drawn today and your tests are completely normal, you will receive your results only by: ?MyChart Message (if you have MyChart) OR ?A paper copy in the mail ?If you have any lab test that is abnormal or we need to change your treatment, we will call you to review the results. ? ? ?Testing/Procedures: ?None ordered  ? ? ?Follow-Up: ?Follow up as scheduled  :1}  ? ? ?Other Instructions ? ?Important Information About Sugar ? ? ? ? ?  ?

## 2021-09-17 ENCOUNTER — Other Ambulatory Visit: Payer: Self-pay

## 2021-09-17 MED ORDER — ELIQUIS 5 MG PO TABS: 5.0000 mg | ORAL_TABLET | Freq: Two times a day (BID) | ORAL | 1 refills | Status: DC

## 2021-09-17 NOTE — Telephone Encounter (Signed)
Prescription refill request for Eliquis received. Indication: afib  Last office visit: [place] 06/22/2021 Scr: 0.62, 09/09/2021 Age: 76 yo  Weight: 54.5 kg   Refill sent

## 2021-10-14 DIAGNOSIS — Z952 Presence of prosthetic heart valve: Secondary | ICD-10-CM | POA: Diagnosis not present

## 2021-10-15 NOTE — Progress Notes (Addendum)
[facility]   MULTIDISCIPLINARY HEART VALVE CLINIC                                     Cardiology Office Note:    Date:  10/20/2021   ID:  Carrie [name], DOB 01/30/1946, MRN [id number]  PCP:  Etta Grandchild, MD  [facility] Cardiologist:  Meriam Sprague, MD  /Dr. Excell Seltzer, MD & Dr. Laneta Simmers, MD (TAVR) [facility] Electrophysiologist:  None   Referring MD: Etta Grandchild, MD   Chief Complaint  Patient presents with   Follow-up    1 month s/p TAVR    History of Present Illness:    Carrie [name] is a 76 y.o. female with a hx of moderate nonobstructive CAD, remote SVT ablation, HOCM with LVOT obstruction, paroxysmal atrial fibrillation on Eliquis, severe MAC, mod-severe MR with mild MS and severe AS (previously stage C with normal stress echo) now progression of symptoms of fatigue who presents today for [facility] post TAVR follow up.    Carrie [name] was initially see by Dr. Excell Seltzer 09/16/20. She established with Dr. Shirlee Latch in 2012 and had previously followed with Dr. Jens Som then Dr. Delton See. She had a cardiac catheterization in 2005 which demonstrated moderate stenosis of the mid LAD. She underwent an SVT ablation in 2005. She had mild aortic stenosis dating back approximately 10 years with a mean gradient of 15 mmHg back in 2012. At the time of her last visit with Dr. Delton See in 04/2020 she was felt to be stable from a cardiac perspective without symptoms therefore plan was to continue close echocardiogram surveillance every 6 months due to mild progression of her AS with a peak transvalvular velocity of 4.2 m/s and a mean gradient of 34 mmHg with calculated valve area 0.9 cm. She then saw Dr. Excell Seltzer again 02/2021 and was referred to Dr. Laneta Simmers due to the complexity of her cardiac disease however was felt to be a poor candidate for surgery due to extensive calcification in the ascending aorta and aortic arch as well as the need for complex surgery which would  involve double valve replacement and septal myectomy. She was felt to be a better candidate for TAVR at that time.    She then started experiencing progressive symptoms of fatigue and upper back pain with physical activity. A repeat echocardiogram at that time confirmed worsening valvular aortic stenosis with an LVEF 60%, severe basal septal hypertrophy with SAM and LVOT gradient of 3.5 m/s with valsalva c/w HOCM. Aov mean grad 49.3 mmHg, peak grad 84.4 mmHg, AVA 0.57 cm2, DVI 0.23 moderate to severe MR with mild MS and severe MAC. Mild AR. BSA: 1.6 m2.    Cardiac catheterization performed 04/22/21 showed calcified but nonobstructive coronary artery disease with patency of the left main, LAD, left circumflex, ramus intermedius, and right coronary arteries. Severe aortic stenosis with mean transvalvular gradient 27 mmHg and calculated aortic valve area 0.97 cm (probable combination of valvular and subvalvular stenosis). Mild pulmonary hypertension with mean pulmonary artery pressure 28 mmHg, transpulmonary gradient 9 mmHg, V wave 24 mmHg.    She was discussed with our multidisciplinary valve team and felt to have severe, symptomatic aortic stenosis and to be a suitable candidate for TAVR, which was 09/08/21 with a Medtronic Evolut FX 26mm via the TF approach. There was significant leak after valve deployment therefore valve was post dilated with improvement.  Post operative echo with mean gradient measures at 21.5 mmHg, peak gradient measures 43.57mmHg and AVA by VTI measures 1.68 cm. She was restarted on Eliquis with no issues.   I saw her in post TAVR follow up and she was doing very well with no symptoms. Today she comes with her daughter and she continues to feel great with no symptoms of SOB, LE edema, dizziness, or syncope. She is walking at least 1 mile per day with no issues and has even started working in her garden. She is participating in cardiac rehab however this is costing her $40 per session  ands she is asking to stop. I have ensured her that she can stop and continue her current walking regimen.    Past Medical History:  Diagnosis Date   Coronary artery disease    a. Prior LHC (07/24/03): LMCA heavily calcified without significant stenosis, LAD with heavy calcification proximally. 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 Excell Seltzer & Dr. Laneta Simmers   Severe aortic stenosis    Wears hearing aid    left    Past Surgical History:  Procedure Laterality Date   ATRIAL ABLATION SURGERY  04/27/2003   BREAST SURGERY  04/26/1980   Benign tumor removed RT   BUNIONECTOMY  04/07/2012   Procedure: BUNIONECTOMY;  Surgeon: Harvie Junior, MD;  Location: [facility];  Service: Orthopedics;  Laterality: Left;  CHEVRON OSTEOTOMY LEFT FOOT    CARDIAC CATHETERIZATION  04/27/2003   EYE SURGERY     bilateral cataracts   GANGLION CYST EXCISION     left wrist   HERNIA REPAIR  04/26/1988   rt ing   INTRAOPERATIVE TRANSTHORACIC ECHOCARDIOGRAM N/A 09/08/2021   Procedure: INTRAOPERATIVE TRANSTHORACIC ECHOCARDIOGRAM;  Surgeon: Tonny Bollman, MD;  Location: [facility] INVASIVE CV LAB;  Service: Open Heart Surgery;  Laterality: N/A;   RIGHT/LEFT HEART CATH AND CORONARY ANGIOGRAPHY N/A 04/22/2021   Procedure: RIGHT/LEFT HEART CATH AND CORONARY ANGIOGRAPHY;  Surgeon: Tonny Bollman, MD;  Location: [facility] INVASIVE CV LAB;  Service: Cardiovascular;   Laterality: N/A;   TONSILLECTOMY     TRANSCATHETER AORTIC VALVE REPLACEMENT, TRANSFEMORAL N/A 09/08/2021   Procedure: Transcatheter Aortic Valve Replacement, Transfemoral;  Surgeon: Tonny Bollman, MD;  Location: [facility] INVASIVE CV LAB;  Service: Open Heart Surgery;  Laterality: N/A;   TUBAL LIGATION      Current Medications: Current Meds  Medication Sig   Cholecalciferol (VITAMIN D3) 1000 UNITS CAPS Take 1 capsule by mouth daily.   denosumab (PROLIA) 60 MG/ML SOSY injection Inject 60 mg into the skin every 6 (six) months.   ELIQUIS 5 MG TABS tablet Take 1 tablet (5 mg total) by mouth 2 (two) times daily.   metoprolol succinate (TOPROL-XL) 50 MG 24 hr tablet Take 1 tablet (50 mg total) by mouth daily. Take with or immediately following a meal.   Polyethyl Glycol-Propyl Glycol (SYSTANE OP) Apply 1 drop to eye 2 (two) times daily as needed (dry  eyes).   rosuvastatin (CRESTOR) 20 MG tablet Take 1 tablet (20 mg total) by mouth daily.   vitamin B-12 (CYANOCOBALAMIN) 1000 MCG tablet Take 1,000 mcg by mouth daily.   [DISCONTINUED] benzonatate (TESSALON) 200 MG capsule Take 1 capsule (200 mg total) by mouth 3 (three) times daily as needed.     Allergies:   Codeine, Crestor [rosuvastatin], Latex, and Tape   Social History   Socioeconomic History   Marital status: Divorced    Spouse name: Not on file   Number of children: 2   Years of education: Not on file   Highest education level: Not on file  Occupational History   Occupation: retired  Tobacco Use   Smoking status: Former    Types: Cigarettes    Quit date: 04/05/1999    Years since quitting: 22.5   Smokeless tobacco: Never   Tobacco comments:    Regular Exercise-No  Vaping Use   Vaping Use: Never used  Substance and Sexual Activity   Alcohol use: Yes    Alcohol/week: 7.0 standard drinks of alcohol    Types: 7 Glasses of wine per week    Comment: 1 drink per week   Drug use: No   Sexual activity: Not Currently    Birth  control/protection: Post-menopausal  Other Topics Concern   Not on file  Social History Narrative   Not on file   Social Determinants of Health   Financial Resource Strain: Medium Risk (12/13/2019)   Overall Financial Resource Strain (CARDIA)    Difficulty of Paying Living Expenses: Somewhat hard  Food Insecurity: No Food Insecurity (11/06/2020)   Hunger Vital Sign    Worried About Running Out of Food in the Last Year: Never true    Ran Out of Food in the Last Year: Never true  Transportation Needs: No Transportation Needs (11/06/2020)   PRAPARE - Administrator, Civil Service (Medical): No    Lack of Transportation (Non-Medical): No  Physical Activity: Sufficiently Active (11/06/2020)   Exercise Vital Sign    Days of Exercise per Week: 7 days    Minutes of Exercise per Session: 30 min  Stress: No Stress Concern Present (11/06/2020)   [employer] of Occupational Health - Occupational Stress Questionnaire    Feeling of Stress : Not at all  Social Connections: Moderately Integrated (11/06/2020)   Social Connection and Isolation Panel [NHANES]    Frequency of Communication with Friends and Family: More than three times a week    Frequency of Social Gatherings with Friends and Family: Once a week    Attends Religious Services: More than 4 times per year    Active Member of [employer] or Organizations: Yes    Attends Engineer, structural: More than 4 times per year    Marital Status: Divorced    Family History: The patient's family history includes Congestive Heart Failure in her mother; Dementia in her mother; Heart disease (age of onset: 34) in her mother; Hypertension in an other family member; Valvular heart disease in her father. There is no history of Colon cancer or Rectal cancer.  ROS:   Please see the history of present illness.    All other systems reviewed and are negative.  EKGs/Labs/Other Studies Reviewed:    The following studies were reviewed  today:  Echocardiogram 10/19/21:   1. SAM of the anterior mitral leaflet with turbulent outflow through LVOT  After Valsallva maneuver, peak gradient through the LV, LVOT, AV is 47 mm  Hg. . Left ventricular ejection fraction, by estimation, is 55 to 60%. The  left ventricle has normal  function. There is mild left ventricular hypertrophy. Left ventricular  diastolic parameters are consistent with Grade I diastolic dysfunction  (impaired relaxation). Elevated left atrial pressure.   2. Right ventricular systolic function is normal. The right ventricular  size is normal. Mildly increased right ventricular wall thickness. There  is normal pulmonary artery systolic pressure.   3. Left atrial size was mildly dilated.   4. Peak and mean gradients through the valve are 8 and 2 mm Hg  respectively. MVA by PT1/2 is 2.1 cm2.. Moderate mitral valve  regurgitation. Mild mitral stenosis. Severe mitral annular calcification.   5. S/p TAVR (26 mm Medtronic Evolut, procedure date 09/08/21). Peak and  mean gradients through the valve are. Peak and mean gradients through the  valve are 23 and 12 mm Hg respectively. Mild perivalvular AI. Marland Kitchen The aortic  valve has been repaired/replaced.   There is a 26 mm Medtronic Evolut valve present in the aortic position.  Procedure Date: 09/08/2021.    TAVR OPERATIVE NOTE       Date of Procedure:                09/08/2021   Preoperative Diagnosis:      Severe Aortic Stenosis    Postoperative Diagnosis:    Same    Procedure:        Transcatheter Aortic Valve Replacement - Percutaneous Right Transfemoral Approach             Medtronic Evolut FX (size 26 mm,  serial # [id number])              Co-Surgeons:                        Alleen Borne, MD and Tonny Bollman, MD     Anesthesiologist:                  Jairo Ben, MD   Echocardiographer:              Thurmon Fair, MD   Pre-operative Echo Findings: Severe aortic stenosis Normal left ventricular  systolic function   Post-operative Echo Findings: Trace paravalvular leak Normal left ventricular systolic function _____________   Echo 09/09/21:    IMPRESSIONS Left ventricular ejection fraction, by estimation, is 70 to 75%. The left ventricle has hyperdynamic function. The left ventricle has no regional wall motion abnormalities. There is moderate asymmetric left ventricular hypertrophy of the basal-septal segment. Left ventricular diastolic parameters are consistent with Grade I diastolic dysfunction (impaired relaxation). 1. Right ventricular systolic function is normal. The right ventricular size is normal. There is mildly elevated pulmonary artery systolic pressure. 2. 3. Left atrial size was severely dilated. There is at most mild mitral stenosis. Mitral gradients are high due to tachycardia and severe mitral insufficiency. There is moderate to severe systolic anterior motion of the mitral valve with secondary mitral insufficiency and LV outflow obstruction. The mitral valve is degenerative. Severe mitral valve regurgitation. The mean mitral valve gradient is 8.5 mmHg with average heart rate of 105 bpm. Severe mitral annular calcification. 4. Aortic valve gradients and calculated aortic valve area are inaccurate due to subvalvular dynamic obstruction due to HOCM pathophysiology. Dimensionless valve index is consistent with normal prosthetic valve function.. The aortic valve has been repaired/replaced. Aortic valve regurgitation is not visualized. There is a 26 mm Sapien prosthetic (TAVR) valve  present in the aortic position. Echo findings are consistent with normal structure and function of the aortic valve prosthesis. Aortic valve Vmax measures 3.30 m/s. 5. 6. The inferior vena cava is normal in size with greater than 50% respiratory variability,  EKG:  EKG is not ordered today.   Recent Labs: 09/04/2021: ALT 17 09/09/2021: BUN 9; Creatinine, Ser 0.62; Hemoglobin 12.7;  Magnesium 2.0; Platelets 217; Potassium 4.1; Sodium 134  Recent Lipid Panel    Component Value Date/Time   CHOL 199 05/21/2020 1024   TRIG 175 (H) 05/21/2020 1024   HDL 78 05/21/2020 1024   CHOLHDL 2.6 05/21/2020 1024   CHOLHDL 2.3 08/18/2015 1030   VLDL 22 08/18/2015 1030   LDLCALC 92 05/21/2020 1024   LDLDIRECT 133.8 12/19/2012 0828   Physical Exam:    VS:  BP 110/64   Pulse 72   Ht 5\' 1"  (1.549 m)   Wt 122 lb 3.2 oz (55.4 kg)   BMI 23.09 kg/m     Wt Readings from Last 3 Encounters:  10/19/21 122 lb 3.2 oz (55.4 kg)  09/16/21 120 lb 3.2 oz (54.5 kg)  09/09/21 129 lb 3 oz (58.6 kg)    General: Well developed, well nourished, NAD Lungs:Clear to ausculation bilaterally. Breathing is unlabored. Cardiovascular: RRR with S1 S2. + systolic murmur Extremities: No edema.  Neuro: Alert and oriented. No focal deficits. No facial asymmetry. MAE spontaneously. Psych: Responds to questions appropriately with normal affect.    ASSESSMENT/PLAN:    Severe AS: s/p successful TAVR with a Medtronic Evolut FX 26mm via the TF approach on 09/08/21. There was significant leak after valve deployment therefore valve was post dilated with improvement. Post operative echo with mean gradient measures at 21.5 mmHg, peak gradient measures 43.52mmHg and AVA by VTI measures 1.68 cm. She was restarted on Eliquis with no issues. She is doing very well post procedure with NYHA class I symptoms. Walking daily. Echocardiogram today with LVEF at 55-60% with mean gradient at and peak at and stable Medtronic 26mm valve. Continue SBE with Amoxicillin, lifelong. Plan one year follow up with our team with echo. Will see general cardiology in 6 months.    Paroxysmal atrial fibrillation: Tolerating Eliquis with no bleeding in stool or urine. Continue Toprol and Eliquis.    Hypertrophic cardiomyopathy with subvalvular stenosis: Echo with LVEF at 60-65% with moderate concentric left ventricular hypertrophy  and SAM of the anterior mitral leaflet with turbulent outflow through LVOT   Severe mitral annular calcification with at least moderate mitral regurgitation: Severe mitral annular calcification with moderate to severe mitral valve regurgitation. On echo today with SAM of the anterior mitral leaflet with turbulent outflow through LVOT after Valsallva maneuver with a peak gradient through the LVOT at 47 mmHg. She remains asymptomatic. Continue with surveillance echo in one year or sooner if symptoms.      Medication Adjustments/Labs and Tests Ordered: Current medicines are reviewed at length with the patient today.  Concerns regarding medicines are outlined above.  Orders Placed This Encounter  Procedures   ECHOCARDIOGRAM COMPLETE   No orders of the defined types were placed in this encounter.   Patient Instructions  Medication Instructions:  Your physician recommends that you continue on your current medications as directed. Please refer to the Current Medication list given to you today.  *If you need a refill on your cardiac medications before your next appointment, please call your pharmacy*   Lab Work: None ordered  If you have labs (  blood work) drawn today and your tests are completely normal, you will receive your results only by: MyChart Message (if you have MyChart) OR A paper copy in the mail If you have any lab test that is abnormal or we need to change your treatment, we will call you to review the results.   Testing/Procedures: None ordered   Follow-Up: At [facility], you and your health needs are our priority.  As part of our continuing mission to provide you with exceptional heart care, we have created designated Provider Care Teams.  These Care Teams include your primary Cardiologist (physician) and Advanced Practice Providers (APPs -  Physician Assistants and Nurse Practitioners) who all work together to provide you with the care you need, when you need it.  We  recommend signing up for the patient portal called "MyChart".  Sign up information is provided on this After Visit Summary.  MyChart is used to connect with patients for Virtual Visits (Telemedicine).  Patients are able to view lab/test results, encounter notes, upcoming appointments, etc.  Non-urgent messages can be sent to your provider as well.   To learn more about what you can do with MyChart, go to [email or web address].    Your next appointment:   6 month(s) WITH DR. Shari Prows  1 YEAR WITH REPEAT ECHO / Shaniquia Brafford, NP  The format for your next appointment:   In Person  Provider:       Other Instructions   Important Information About Sugar         Signed, Georgie Chard, NP  10/20/2021 7:42 AM    [facility]

## 2021-10-19 ENCOUNTER — Ambulatory Visit (HOSPITAL_COMMUNITY): Payer: Medicare HMO | Attending: Internal Medicine

## 2021-10-19 ENCOUNTER — Ambulatory Visit: Payer: Medicare HMO | Admitting: Cardiology

## 2021-10-19 VITALS — BP 110/64 | HR 72 | Ht 61.0 in | Wt 122.2 lb

## 2021-10-19 DIAGNOSIS — I350 Nonrheumatic aortic (valve) stenosis: Secondary | ICD-10-CM

## 2021-10-19 DIAGNOSIS — Z952 Presence of prosthetic heart valve: Secondary | ICD-10-CM | POA: Diagnosis not present

## 2021-10-19 DIAGNOSIS — I10 Essential (primary) hypertension: Secondary | ICD-10-CM

## 2021-10-19 DIAGNOSIS — I421 Obstructive hypertrophic cardiomyopathy: Secondary | ICD-10-CM

## 2021-10-19 DIAGNOSIS — E785 Hyperlipidemia, unspecified: Secondary | ICD-10-CM

## 2021-10-19 DIAGNOSIS — I480 Paroxysmal atrial fibrillation: Secondary | ICD-10-CM | POA: Diagnosis not present

## 2021-10-19 DIAGNOSIS — I340 Nonrheumatic mitral (valve) insufficiency: Secondary | ICD-10-CM

## 2021-10-19 LAB — ECHOCARDIOGRAM COMPLETE
AV Mean grad: 12 mmHg
AV Peak grad: 23.4 mmHg
Ao pk vel: 2.42 m/s
Area-P 1/2: 2.14 cm2
Height: 61 in
MV M vel: 5.85 m/s
MV Peak grad: 136.9 mmHg
P 1/2 time: 443 msec
Radius: 0.7 cm
S' Lateral: 2 cm

## 2021-10-28 DIAGNOSIS — Z952 Presence of prosthetic heart valve: Secondary | ICD-10-CM | POA: Diagnosis not present

## 2021-10-30 ENCOUNTER — Telehealth: Payer: Medicare HMO

## 2021-11-09 ENCOUNTER — Ambulatory Visit (INDEPENDENT_AMBULATORY_CARE_PROVIDER_SITE_OTHER): Payer: Medicare HMO

## 2021-11-09 VITALS — BP 122/60 | HR 70 | Temp 98.7°F | Ht 61.0 in | Wt 121.8 lb

## 2021-11-09 DIAGNOSIS — M810 Age-related osteoporosis without current pathological fracture: Secondary | ICD-10-CM

## 2021-11-09 DIAGNOSIS — Z0000 Encounter for general adult medical examination without abnormal findings: Secondary | ICD-10-CM

## 2021-11-09 DIAGNOSIS — Z1211 Encounter for screening for malignant neoplasm of colon: Secondary | ICD-10-CM

## 2021-11-09 MED ORDER — DENOSUMAB 60 MG/ML ~~LOC~~ SOSY
60.0000 mg | PREFILLED_SYRINGE | Freq: Once | SUBCUTANEOUS | Status: AC
  Administered 2021-11-09: 60 mg via SUBCUTANEOUS

## 2021-11-09 NOTE — Patient Instructions (Signed)
Carrie [name] , Thank you for taking time to come for your Medicare Wellness Visit. I appreciate your ongoing commitment to your health goals. Please review the following plan we discussed and let me know if I can assist you in the future.   Screening recommendations/referrals: Colonoscopy: 04/15/2017; due every 3 years (order placed 11/09/2021) Mammogram: 12/04/2020; due every year Bone Density: 09/29/2020; due every 2 years Recommended yearly ophthalmology/optometry visit for glaucoma screening and checkup Recommended yearly dental visit for hygiene and checkup  Vaccinations: Influenza vaccine: 03/15/2021; due every Fall Season  Pneumococcal vaccine: 06/22/2012, 04/22/2015 (completed) Tdap vaccine: due; recommend every 10 years Shingles vaccine: due; recommend Shingrix which is 2 doses 2-6 months apart and over 90% effective      Covid-19: 05/29/2019, 06/26/2019, 02/20/2020  Advanced directives: Yes; Please bring a copy of your health care power of attorney and living will to the office at your convenience.  Conditions/risks identified: Yes  Next appointment: Please schedule your next Medicare Wellness Visit with your Nurse Health Advisor in 1 year by calling [phone].   Preventive Care 22 Years and Older, Female Preventive care refers to lifestyle choices and visits with your health care provider that can promote health and wellness. What does preventive care include? A yearly physical exam. This is also called an annual well check. Dental exams once or twice a year. Routine eye exams. Ask your health care provider how often you should have your eyes checked. Personal lifestyle choices, including: Daily care of your teeth and gums. Regular physical activity. Eating a healthy diet. Avoiding tobacco and drug use. Limiting alcohol use. Practicing safe sex. Taking low-dose aspirin every day. Taking vitamin and mineral supplements as recommended by your health care provider. What happens  during an annual well check? The services and screenings done by your health care provider during your annual well check will depend on your age, overall health, lifestyle risk factors, and family history of disease. Counseling  Your health care provider may ask you questions about your: Alcohol use. Tobacco use. Drug use. Emotional well-being. Home and relationship well-being. Sexual activity. Eating habits. History of falls. Memory and ability to understand (cognition). Work and work Statistician. Reproductive health. Screening  You may have the following tests or measurements: Height, weight, and BMI. Blood pressure. Lipid and cholesterol levels. These may be checked every 5 years, or more frequently if you are over 64 years old. Skin check. Lung cancer screening. You may have this screening every year starting at age 17 if you have a 30-pack-year history of smoking and currently smoke or have quit within the past 15 years. Fecal occult blood test (FOBT) of the stool. You may have this test every year starting at age 71. Flexible sigmoidoscopy or colonoscopy. You may have a sigmoidoscopy every 5 years or a colonoscopy every 10 years starting at age 68. Hepatitis C blood test. Hepatitis B blood test. Sexually transmitted disease (STD) testing. Diabetes screening. This is done by checking your blood sugar (glucose) after you have not eaten for a while (fasting). You may have this done every 1-3 years. Bone density scan. This is done to screen for osteoporosis. You may have this done starting at age 81. Mammogram. This may be done every 1-2 years. Talk to your health care provider about how often you should have regular mammograms. Talk with your health care provider about your test results, treatment options, and if necessary, the need for more tests. Vaccines  Your health care provider may recommend certain  vaccines, such as: Influenza vaccine. This is recommended every  year. Tetanus, diphtheria, and acellular pertussis (Tdap, Td) vaccine. You may need a Td booster every 10 years. Zoster vaccine. You may need this after age 69. Pneumococcal 13-valent conjugate (PCV13) vaccine. One dose is recommended after age 45. Pneumococcal polysaccharide (PPSV23) vaccine. One dose is recommended after age 95. Talk to your health care provider about which screenings and vaccines you need and how often you need them. This information is not intended to replace advice given to you by your health care provider. Make sure you discuss any questions you have with your health care provider. Document Released: 05/09/2015 Document Revised: 12/31/2015 Document Reviewed: 02/11/2015 Elsevier Interactive Patient Education  2017 [place] Prevention in the Home Falls can cause injuries. They can happen to people of all ages. There are many things you can do to make your home safe and to help prevent falls. What can I do on the outside of my home? Regularly fix the edges of walkways and driveways and fix any cracks. Remove anything that might make you trip as you walk through a door, such as a raised step or threshold. Trim any bushes or trees on the path to your home. Use bright outdoor lighting. Clear any walking paths of anything that might make someone trip, such as rocks or tools. Regularly check to see if handrails are loose or broken. Make sure that both sides of any steps have handrails. Any raised decks and porches should have guardrails on the edges. Have any leaves, snow, or ice cleared regularly. Use sand or salt on walking paths during winter. Clean up any spills in your garage right away. This includes oil or grease spills. What can I do in the bathroom? Use night lights. Install grab bars by the toilet and in the tub and shower. Do not use towel bars as grab bars. Use non-skid mats or decals in the tub or shower. If you need to sit down in the shower, use a  plastic, non-slip stool. Keep the floor dry. Clean up any water that spills on the floor as soon as it happens. Remove soap buildup in the tub or shower regularly. Attach bath mats securely with double-sided non-slip rug tape. Do not have throw rugs and other things on the floor that can make you trip. What can I do in the bedroom? Use night lights. Make sure that you have a light by your bed that is easy to reach. Do not use any sheets or blankets that are too big for your bed. They should not hang down onto the floor. Have a firm chair that has side arms. You can use this for support while you get dressed. Do not have throw rugs and other things on the floor that can make you trip. What can I do in the kitchen? Clean up any spills right away. Avoid walking on wet floors. Keep items that you use a lot in easy-to-reach places. If you need to reach something above you, use a strong step stool that has a grab bar. Keep electrical cords out of the way. Do not use floor polish or wax that makes floors slippery. If you must use wax, use non-skid floor wax. Do not have throw rugs and other things on the floor that can make you trip. What can I do with my stairs? Do not leave any items on the stairs. Make sure that there are handrails on both sides of the stairs  and use them. Fix handrails that are broken or loose. Make sure that handrails are as long as the stairways. Check any carpeting to make sure that it is firmly attached to the stairs. Fix any carpet that is loose or worn. Avoid having throw rugs at the top or bottom of the stairs. If you do have throw rugs, attach them to the floor with carpet tape. Make sure that you have a light switch at the top of the stairs and the bottom of the stairs. If you do not have them, ask someone to add them for you. What else can I do to help prevent falls? Wear shoes that: Do not have high heels. Have rubber bottoms. Are comfortable and fit you  well. Are closed at the toe. Do not wear sandals. If you use a stepladder: Make sure that it is fully opened. Do not climb a closed stepladder. Make sure that both sides of the stepladder are locked into place. Ask someone to hold it for you, if possible. Clearly mark and make sure that you can see: Any grab bars or handrails. First and last steps. Where the edge of each step is. Use tools that help you move around (mobility aids) if they are needed. These include: Canes. Walkers. Scooters. Crutches. Turn on the lights when you go into a dark area. Replace any light bulbs as soon as they burn out. Set up your furniture so you have a clear path. Avoid moving your furniture around. If any of your floors are uneven, fix them. If there are any pets around you, be aware of where they are. Review your medicines with your doctor. Some medicines can make you feel dizzy. This can increase your chance of falling. Ask your doctor what other things that you can do to help prevent falls. This information is not intended to replace advice given to you by your health care provider. Make sure you discuss any questions you have with your health care provider. Document Released: 02/06/2009 Document Revised: 09/18/2015 Document Reviewed: 05/17/2014 Elsevier Interactive Patient Education  2017 [employer].

## 2021-11-09 NOTE — Addendum Note (Signed)
Addended by: Sheral Flow on: 11/09/2021 10:13 AM   Modules accepted: Orders

## 2021-11-09 NOTE — Progress Notes (Signed)
After obtaining consent, and per orders of Dr. Ronnald Ramp, injection of Prolia was given on the left arm by Marrian Salvage. Patient tolerated well and was instructed to report any adverse reaction to me immediately.

## 2021-11-09 NOTE — Progress Notes (Signed)
Subjective:   Carrie [name] is a 76 y.o. female who presents for Medicare Annual (Subsequent) preventive examination.  Review of Systems: No ROS. Medicare Wellness Visit. Additional risk factors are reflected in social history.    Sleep Patterns: No sleep issues, feels rested on waking and sleeps 8 hours nightly. Home Safety/Smoke Alarms: Feels safe in home; Smoke alarms in place. Living environment: 1-story home; Lives alone; no needs for DME; good family support system. Seat Belt Safety/Bike Helmet: Wears seat belt.  Cardiac Risk Factors include: advanced age (>66mn, >>69women);dyslipidemia;family history of premature cardiovascular disease;hypertension     Objective:    Today's Vitals   11/09/21 0913  BP: 122/60  Pulse: 70  Temp: 98.7 F (37.1 C)  SpO2: 98%  Weight: 121 lb 12.8 oz (55.2 kg)  Height: '5\' 1"'$  (1.549 m)  PainSc: 0-No pain   Body mass index is 23.01 kg/m.     11/09/2021    9:36 AM 09/08/2021   10:25 AM 09/04/2021    2:10 PM 04/22/2021    7:38 AM 11/06/2020   12:46 PM 04/27/2017    7:38 PM 04/15/2017   10:54 AM  Advanced Directives  Does Patient Have a Medical Advance Directive? Yes Yes Yes Yes Yes No No  Type of Advance Directive Living will;Healthcare Power of [place] Living will;Healthcare Power of [place] Living will;Healthcare Power of Attorney    Does patient want to make changes to medical advance directive? No - Patient declined No - Patient declined  No - Patient declined No - Patient declined    Copy of [place] Chart? No - copy requested No - copy requested  No - copy requested No - copy requested      Current Medications (verified) Outpatient Encounter Medications as of 11/09/2021  Medication Sig   Cholecalciferol (VITAMIN D3) 1000 UNITS CAPS Take 1 capsule by mouth daily.   denosumab (PROLIA) 60 MG/ML SOSY injection Inject 60  mg into the skin every 6 (six) months.   ELIQUIS 5 MG TABS tablet Take 1 tablet (5 mg total) by mouth 2 (two) times daily.   metoprolol succinate (TOPROL-XL) 50 MG 24 hr tablet Take 1 tablet (50 mg total) by mouth daily. Take with or immediately following a meal.   Polyethyl Glycol-Propyl Glycol (SYSTANE OP) Apply 1 drop to eye 2 (two) times daily as needed (dry eyes).   rosuvastatin (CRESTOR) 20 MG tablet Take 1 tablet (20 mg total) by mouth daily.   vitamin B-12 (CYANOCOBALAMIN) 1000 MCG tablet Take 1,000 mcg by mouth daily.   No facility-administered encounter medications on file as of 11/09/2021.    Allergies (verified) Codeine, Crestor [rosuvastatin], Latex, and Tape   History: Past Medical History:  Diagnosis Date   Coronary artery disease    a. Prior LHC (07/24/03): LMCA heavily calcified without significant stenosis, LAD with heavy calcification proximally. 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 CBurt Knack& Dr. BCyndia Bent  Severe aortic stenosis    Wears hearing aid    left   Past Surgical History:  Procedure Laterality Date   ATRIAL ABLATION SURGERY  04/27/2003   BREAST SURGERY  04/26/1980   Benign tumor removed RT   BUNIONECTOMY  04/07/2012   Procedure: BUNIONECTOMY;  Surgeon: JAlta Corning MD;  [facility] [facility]  Service: Orthopedics;  Laterality: Left;  CHEVRON OSTEOTOMY LEFT FOOT    CARDIAC  CATHETERIZATION  04/27/2003   EYE SURGERY     bilateral cataracts   GANGLION CYST EXCISION     left wrist   HERNIA REPAIR  04/26/1988   rt ing   INTRAOPERATIVE TRANSTHORACIC ECHOCARDIOGRAM N/A 09/08/2021   Procedure: INTRAOPERATIVE TRANSTHORACIC ECHOCARDIOGRAM;  Surgeon: CSherren Mocha MD;  [facility];  Service: Open Heart Surgery;  Laterality: N/A;   RIGHT/LEFT HEART CATH AND CORONARY ANGIOGRAPHY N/A 04/22/2021   Procedure: RIGHT/LEFT HEART CATH AND CORONARY ANGIOGRAPHY;  Surgeon: CSherren Mocha MD;  [facility];  Service: Cardiovascular;  Laterality: N/A;   TONSILLECTOMY     TRANSCATHETER AORTIC VALVE REPLACEMENT, TRANSFEMORAL N/A 09/08/2021   Procedure: Transcatheter Aortic Valve Replacement, Transfemoral;  Surgeon: CSherren Mocha MD;  [facility];  Service: Open Heart Surgery;  Laterality: N/A;   TUBAL LIGATION     Family History  Problem Relation Age of Onset   Heart disease Mother 664  Congestive Heart Failure Mother    Dementia Mother    Hypertension Other    Valvular heart disease Father        Had valves replaced   Colon cancer Neg Hx    Rectal cancer Neg Hx    Social History   Socioeconomic History   Marital status: Divorced    Spouse name: Not on file   Number of children: 2   Years of education: Not on file   Highest education level: Not on file  Occupational History   Occupation: retired  Tobacco Use   Smoking status: Former    Types: Cigarettes    Quit date: 04/05/1999    Years since quitting: 22.6   Smokeless tobacco: Never   Tobacco comments:    Regular Exercise-No  Vaping Use   Vaping Use: Never used  Substance and Sexual Activity   Alcohol use: Yes    Alcohol/week: 7.0 standard drinks of alcohol    Types: 7 Glasses of wine per week    Comment: 1 drink per week   Drug use: No   Sexual activity: Not Currently    Birth control/protection: Post-menopausal  Other Topics Concern   Not on file   Social History Narrative   Not on file   Social Determinants of Health   Financial Resource Strain: Low Risk  (11/09/2021)   Overall Financial Resource Strain (CARDIA)    Difficulty of Paying Living Expenses: Not hard at all  Food Insecurity: No Food Insecurity (11/09/2021)   Hunger Vital Sign    Worried About Running Out of Food in the Last Year: Never true    Ran Out of Food in the Last Year: Never true  Transportation Needs: No Transportation Needs (11/09/2021)   PRAPARE - THydrologist(Medical): No    Lack of Transportation (Non-Medical): No  Physical Activity: Sufficiently Active (11/09/2021)   Exercise Vital Sign    Days of Exercise per Week: 5 days  Minutes of Exercise per Session: 30 min  Recent Concern: Physical Activity - Inactive (11/09/2021)   Exercise Vital Sign    Days of Exercise per Week: 5 days    Minutes of Exercise per Session: 0 min  Stress: No Stress Concern Present (11/09/2021)   [place]    Feeling of Stress : Not at all  Social Connections: Moderately Integrated (11/09/2021)   Social Connection and Isolation Panel [NHANES]    Frequency of Communication with Friends and Family: More than three times a week    Frequency of Social Gatherings with Friends and Family: Once a week    Attends Religious Services: More than 4 times per year    Active Member of [employer] or Organizations: Yes    Attends Music therapist: More than 4 times per year    Marital Status: Divorced    Tobacco Counseling Counseling given: Not Answered Tobacco comments: Regular Exercise-No   Clinical Intake:  Pre-visit preparation completed: Yes  Pain : No/denies pain Pain Score: 0-No pain     BMI - recorded: 23.01 Nutritional Status: BMI of 19-24  Normal Nutritional Risks: None Diabetes: No  How often do you need to have someone help you when you read instructions,  pamphlets, or other written materials from your doctor or pharmacy?: 1 - Never What is the last grade level you completed in school?: HSG  Diabetic? no  Interpreter Needed?: No  Information entered by :: Geena Weinhold N. Justo Hengel, LPN.   Activities of Daily Living    11/09/2021    9:59 AM 09/09/2021    5:04 AM  In your present state of health, do you have any difficulty performing the following activities:  Hearing? 1   Vision? 0   Difficulty concentrating or making decisions? 0   Walking or climbing stairs? 0   Dressing or bathing? 0   Doing errands, shopping? 0 0  Preparing Food and eating ? N   Using the Toilet? N   In the past six months, have you accidently leaked urine? N   Do you have problems with loss of bowel control? N   Managing your Medications? N   Managing your Finances? N   Housekeeping or managing your Housekeeping? N     Patient Care Team: [name], MD as PCP - General (Internal Medicine) Freada Bergeron, MD as PCP - Cardiology (Cardiology) Charlton Haws, [facility] as Pharmacist (Pharmacist) [name], MD as Consulting Physician (Cardiology) Rutherford Guys, MD as Consulting Physician (Ophthalmology) Danella Sensing, MD as Consulting Physician (Dermatology)  Indicate any recent Medical Services you may have received from other than [facility] providers in the past year (date may be approximate).     Assessment:   This is a routine wellness examination for [name].  Hearing/Vision screen Hearing Screening - Comments:: Patient wears hearing aids. Vision Screening - Comments:: Patient wears readers. Cataracts removed. Eye exam done by: Rutherford Guys, MD.  Dietary issues and exercise activities discussed: Current Exercise Habits: Home exercise routine, Type of exercise: walking, Time (Minutes): 30, Frequency (Times/Week): 5, Weekly Exercise (Minutes/Week): 150, Intensity: Moderate, Exercise limited by: cardiac condition(s)   Goals Addressed              This Visit's Progress    Keep being independent and active.        Depression Screen    11/09/2021    9:14 AM 07/07/2021    3:09 PM 11/06/2020   12:50  PM 09/01/2020    1:19 PM 08/30/2019    2:19 PM 05/08/2018    2:40 PM 01/19/2017   12:13 PM  PHQ 2/9 Scores  PHQ - 2 Score 0 0 0 0 0 1 1  PHQ- 9 Score       1    Fall Risk    11/09/2021    9:37 AM 07/07/2021    3:09 PM 11/06/2020   12:50 PM 09/01/2020    1:18 PM 08/30/2019    2:19 PM  Fall Risk   Falls in the past year? 0 0 0 0 0  Number falls in past yr: 0 0 0 0 0  Injury with Fall? 0 0 0 0 0  Risk for fall due to : No Fall Risks  No Fall Risks No Fall Risks No Fall Risks  Follow up Falls evaluation completed  Falls evaluation completed Falls evaluation completed Falls evaluation completed    [place]:  Any stairs in or around the home? No  If so, are there any without handrails? No  Home free of loose throw rugs in walkways, pet beds, electrical cords, etc? Yes  Adequate lighting in your home to reduce risk of falls? Yes   ASSISTIVE DEVICES UTILIZED TO PREVENT FALLS:  Life alert? No  Use of a cane, walker or w/c? No  Grab bars in the bathroom? Yes  Shower chair or bench in shower? No  Elevated toilet seat or a handicapped toilet? Yes   TIMED UP AND GO:  Was the test performed? Yes .  Length of time to ambulate 10 feet: 6 sec.   Gait steady and fast without use of assistive device  Cognitive Function:    01/19/2017   11:47 AM  MMSE - Mini Mental State Exam  Orientation to time 5  Orientation to Place 5  Registration 3  Attention/ Calculation 5  Recall 2  Language- name 2 objects 2  Language- repeat 1  Language- follow 3 step command 3  Language- read & follow direction 1  Write a sentence 1  Copy design 1  Total score 29        11/09/2021    9:16 AM  6CIT Screen  What Year? 0 points  What month? 0 points  What time? 0 points  Count back from 20 0 points  Months  in reverse 0 points  Repeat phrase 0 points  Total Score 0 points    Immunizations Immunization History  Administered Date(s) Administered   Fluad Quad(high Dose 65+) 01/04/2019   Influenza Whole 01/06/2010, 01/19/2012   Influenza, High Dose Seasonal PF 01/23/2015, 01/20/2016, 01/19/2017, 01/03/2018   Influenza,inj,Quad PF,6+ Mos 02/08/2014   Influenza-Unspecified 03/15/2021   Moderna Sars-Covid-2 Vaccination 05/29/2019, 06/26/2019, 02/20/2020   Pneumococcal Conjugate-13 04/22/2015   Pneumococcal Polysaccharide-23 06/22/2012   Td 12/25/2009   Zoster, Live 12/25/2009    TDAP status: Due, Education has been provided regarding the importance of this vaccine. Advised may receive this vaccine at local pharmacy or Health Dept. Aware to provide a copy of the vaccination record if obtained from local pharmacy or Health Dept. Verbalized acceptance and understanding.  Flu Vaccine status: Up to date  Pneumococcal vaccine status: Up to date  Covid-19 vaccine status: Completed vaccines  Qualifies for Shingles Vaccine? Yes   Zostavax completed Yes   Shingrix Completed?: No.    Education has been provided regarding the importance of this vaccine. Patient has been advised to call insurance company to  determine out of pocket expense if they have not yet received this vaccine. Advised may also receive vaccine at local pharmacy or Health Dept. Verbalized acceptance and understanding.  Screening Tests Health Maintenance  Topic Date Due   Zoster Vaccines- Shingrix (1 of 2) Never done   TETANUS/TDAP  12/26/2019   COLONOSCOPY (Pts 45-31yr Insurance coverage will need to be confirmed)  04/15/2020   COVID-19 Vaccine (4 - Moderna series) 04/16/2020   INFLUENZA VACCINE  11/24/2021   Pneumonia Vaccine 76 Years old  Completed   DEXA SCAN  Completed   Hepatitis C Screening  Completed   HPV VACCINES  Aged Out    Health Maintenance  Health Maintenance Due  Topic Date Due   Zoster Vaccines-  Shingrix (1 of 2) Never done   TETANUS/TDAP  12/26/2019   COLONOSCOPY (Pts 45-447yrInsurance coverage will need to be confirmed)  04/15/2020   COVID-19 Vaccine (4 - Moderna series) 04/16/2020    Colorectal cancer screening: Referral to GI placed 11/09/2021. Pt aware the office will call re: appt.  Mammogram status: Completed 12/04/2020. Repeat every year  Bone Density status: Completed 09/29/2020. Results reflect: Bone density results: OSTEOPOROSIS. Repeat every 2 years.  Lung Cancer Screening: (Low Dose CT Chest recommended if Age 76-80ears, 30 pack-year currently smoking OR have quit w/in 15years.) does not qualify.   Lung Cancer Screening Referral: no  Additional Screening:  Hepatitis C Screening: does qualify; Completed 08/16/2016  Vision Screening: Recommended annual ophthalmology exams for early detection of glaucoma and other disorders of the eye. Is the patient up to date with their annual eye exam?  Yes  Who is the provider or what is the name of the office in which the patient attends annual eye exams? [facility]. If pt is not established with a provider, would they like to be referred to a provider to establish care? No .   Dental Screening: Recommended annual dental exams for proper oral hygiene  Community Resource Referral / Chronic Care Management: CRR required this visit?  No   CCM required this visit?  No      Plan:     I have personally reviewed and noted the following in the patient's chart:   Medical and social history Use of alcohol, tobacco or illicit drugs  Current medications and supplements including opioid prescriptions.  Functional ability and status Nutritional status Physical activity Advanced directives List of other physicians Hospitalizations, surgeries, and ER visits in previous 12 months Vitals Screenings to include cognitive, depression, and falls Referrals and appointments  In addition, I have reviewed and discussed with  patient certain preventive protocols, quality metrics, and best practice recommendations. A written personalized care plan for preventive services as well as general preventive health recommendations were provided to patient.     ShSheral FlowLPN   10/31/39/2878 Nurse Notes:

## 2021-11-13 ENCOUNTER — Telehealth: Payer: Self-pay | Admitting: Cardiology

## 2021-11-13 NOTE — Telephone Encounter (Signed)
Returned call to Pt.  She has had nosebleeds over the last few weeks.  Her nosebleed last night was heavier per Pt.  She has been using saline nasal spray.  She states she has already called her PCP-but they advised her it would be 2-3 weeks before she could be seen.  Advised Pt would call Dr. Ronnald Ramp office and advise that Pt may need referral to ENT to evaluate versus a PCP office visit.  Pt in agreement and thanked nurse for assistance.  Outreach made to PCP office.  Left detailed message to be given to Dr. Ronnald Ramp nurse.  Advised would have referral for ENT sent in for Pt for frequent nosebleeds on Eliquis.  PCP nurse will follow up with Pt.  Await further needs.

## 2021-11-13 NOTE — Telephone Encounter (Signed)
I spoke with Carrie [name] at [facility] regarding PT. PT is still experiencing frequent nose bleeds and currently treating it with nasal spray. Wondering now if there is anyway we can get PT an urgent referral to ENT or if a provider would need to see her in office first? PT would like to be contacted once we have a game plan at   [facility]: [phone]

## 2021-11-13 NOTE — Telephone Encounter (Signed)
  Pt c/o medication issue:  1. Name of Medication: ELIQUIS 5 MG TABS tablet  2. How are you currently taking this medication (dosage and times per day)? Take 1 tablet (5 mg total) by mouth 2 (two) times daily.  3. Are you having a reaction (difficulty breathing--STAT)? No   4. What is your medication issue? Pt said, she has a nose bleed last night 2-3 times it was so bad that it as dripping on the floor. She wants to know if she needs to change her medications

## 2021-11-13 NOTE — Telephone Encounter (Signed)
Pt scheduled for 7/25 @ 11am with PCP.

## 2021-11-16 NOTE — Telephone Encounter (Signed)
Last Prolia inj 11/09/21 Next Prolia inj due 05/13/21

## 2021-11-17 ENCOUNTER — Encounter: Payer: Self-pay | Admitting: Internal Medicine

## 2021-11-17 ENCOUNTER — Ambulatory Visit (INDEPENDENT_AMBULATORY_CARE_PROVIDER_SITE_OTHER): Payer: Medicare HMO | Admitting: Internal Medicine

## 2021-11-17 VITALS — BP 124/70 | HR 80 | Temp 97.6°F | Ht 61.0 in | Wt 122.0 lb

## 2021-11-17 DIAGNOSIS — E7800 Pure hypercholesterolemia, unspecified: Secondary | ICD-10-CM | POA: Diagnosis not present

## 2021-11-17 DIAGNOSIS — R040 Epistaxis: Secondary | ICD-10-CM | POA: Diagnosis not present

## 2021-11-17 DIAGNOSIS — Z0001 Encounter for general adult medical examination with abnormal findings: Secondary | ICD-10-CM

## 2021-11-17 DIAGNOSIS — I10 Essential (primary) hypertension: Secondary | ICD-10-CM | POA: Diagnosis not present

## 2021-11-17 DIAGNOSIS — R739 Hyperglycemia, unspecified: Secondary | ICD-10-CM | POA: Insufficient documentation

## 2021-11-17 DIAGNOSIS — Z0000 Encounter for general adult medical examination without abnormal findings: Secondary | ICD-10-CM

## 2021-11-17 LAB — CBC WITH DIFFERENTIAL/PLATELET
Basophils Absolute: 0 10*3/uL (ref 0.0–0.1)
Basophils Relative: 0.5 % (ref 0.0–3.0)
Eosinophils Absolute: 0 10*3/uL (ref 0.0–0.7)
Eosinophils Relative: 0.1 % (ref 0.0–5.0)
HCT: 39.4 % (ref 36.0–46.0)
Hemoglobin: 13.4 g/dL (ref 12.0–15.0)
Lymphocytes Relative: 18.4 % (ref 12.0–46.0)
Lymphs Abs: 1.3 10*3/uL (ref 0.7–4.0)
MCHC: 33.9 g/dL (ref 30.0–36.0)
MCV: 90.8 fl (ref 78.0–100.0)
Monocytes Absolute: 0.5 10*3/uL (ref 0.1–1.0)
Monocytes Relative: 7.4 % (ref 3.0–12.0)
Neutro Abs: 5.1 10*3/uL (ref 1.4–7.7)
Neutrophils Relative %: 73.6 % (ref 43.0–77.0)
Platelets: 225 10*3/uL (ref 150.0–400.0)
RBC: 4.34 Mil/uL (ref 3.87–5.11)
RDW: 13.4 % (ref 11.5–15.5)
WBC: 6.9 10*3/uL (ref 4.0–10.5)

## 2021-11-17 LAB — APTT: aPTT: 37 s — ABNORMAL HIGH (ref 25.4–36.8)

## 2021-11-17 LAB — LIPID PANEL
Cholesterol: 158 mg/dL (ref 0–200)
HDL: 55.4 mg/dL (ref >39.00)
LDL Cholesterol: 67 mg/dL (ref 0–99)
NonHDL: 102.97
Total CHOL/HDL Ratio: 3
Triglycerides: 178 mg/dL — ABNORMAL HIGH (ref 0.0–149.0)
VLDL: 35.6 mg/dL (ref 0.0–40.0)

## 2021-11-17 LAB — PROTIME-INR
INR: 1.4 ratio — ABNORMAL HIGH (ref 0.8–1.0)
Prothrombin Time: 15.5 s — ABNORMAL HIGH (ref 9.6–13.1)

## 2021-11-17 LAB — TSH: TSH: 1.59 u[IU]/mL (ref 0.35–5.50)

## 2021-11-17 LAB — HEMOGLOBIN A1C: Hgb A1c MFr Bld: 5.4 % (ref 4.6–6.5)

## 2021-11-17 NOTE — Progress Notes (Signed)
Subjective:  Patient ID: Carrie [name], female    DOB: [birth year]  Age: 76 y.o. MRN: [id number]  CC: Annual Exam, Hypertension, and Hyperlipidemia   HPI [facility] presents for a CPX and f/up -  She has been experiencing right-sided epistaxis for several weeks but now the epistaxis has resolved over the last week.  She still wants to see an ENT doctor.  She does not notice bleeding from anywhere else.  She is active and denies chest pain, shortness of breath, edema, or palpitations.  Outpatient Medications Prior to Visit  Medication Sig Dispense Refill   Cholecalciferol (VITAMIN D3) 1000 UNITS CAPS Take 1 capsule by mouth daily.     denosumab (PROLIA) 60 MG/ML SOSY injection Inject 60 mg into the skin every 6 (six) months.     ELIQUIS 5 MG TABS tablet Take 1 tablet (5 mg total) by mouth 2 (two) times daily. 180 tablet 1   metoprolol succinate (TOPROL-XL) 50 MG 24 hr tablet Take 1 tablet (50 mg total) by mouth daily. Take with or immediately following a meal. 90 tablet 3   Polyethyl Glycol-Propyl Glycol (SYSTANE OP) Apply 1 drop to eye 2 (two) times daily as needed (dry eyes).     rosuvastatin (CRESTOR) 20 MG tablet Take 1 tablet (20 mg total) by mouth daily. 90 tablet 3   vitamin B-12 (CYANOCOBALAMIN) 1000 MCG tablet Take 1,000 mcg by mouth daily.     No facility-administered medications prior to visit.    ROS Review of Systems  Constitutional: Negative.  Negative for appetite change, diaphoresis, fatigue and unexpected weight change.  HENT:  Positive for nosebleeds. Negative for congestion, sore throat and trouble swallowing.   Eyes: Negative.   Respiratory:  Negative for cough, chest tightness, shortness of breath and wheezing.   Cardiovascular:  Negative for chest pain, palpitations and leg swelling.  Gastrointestinal:  Negative for abdominal pain, anal bleeding, blood in stool, constipation, diarrhea, nausea and vomiting.  Endocrine: Negative.    Genitourinary: Negative.  Negative for hematuria.  Musculoskeletal: Negative.  Negative for arthralgias and myalgias.  Skin: Negative.  Negative for color change and pallor.  Neurological: Negative.  Negative for dizziness and weakness.  Hematological:  Negative for adenopathy. Does not bruise/bleed easily.  Psychiatric/Behavioral: Negative.      Objective:  BP 124/70 (BP Location: Left Arm, Patient Position: Sitting, Cuff Size: Normal)   Pulse 80   Temp 97.6 F (36.4 C) (Oral)   Ht '5\' 1"'$  (1.549 m)   Wt 122 lb (55.3 kg)   SpO2 97%   BMI 23.05 kg/m   BP Readings from Last 3 Encounters:  11/17/21 124/70  11/09/21 122/60  10/19/21 110/64    Wt Readings from Last 3 Encounters:  11/17/21 122 lb (55.3 kg)  11/09/21 121 lb 12.8 oz (55.2 kg)  10/19/21 122 lb 3.2 oz (55.4 kg)    Physical Exam Vitals reviewed.  HENT:     Nose: Nose normal. No nasal tenderness or mucosal edema.     Right Nostril: No foreign body, epistaxis or septal hematoma.     Left Nostril: No foreign body, epistaxis or septal hematoma.     Mouth/Throat:     Mouth: Mucous membranes are moist.  Eyes:     General: No scleral icterus.    Conjunctiva/sclera: Conjunctivae normal.  Cardiovascular:     Rate and Rhythm: Normal rate and regular rhythm.     Heart sounds: Murmur heard.     Systolic murmur is present  with a grade of 1/6.     No friction rub. No gallop.     Comments: 1/^ SEM RUSB Pulmonary:     Effort: Pulmonary effort is normal.     Breath sounds: No stridor. No wheezing, rhonchi or rales.  Abdominal:     General: Abdomen is flat.     Palpations: There is no mass.     Tenderness: There is no abdominal tenderness. There is no guarding.     Hernia: No hernia is present.  Musculoskeletal:        General: Normal range of motion.     Cervical back: Neck supple.     Right lower leg: No edema.     Left lower leg: No edema.  Lymphadenopathy:     Cervical: No cervical adenopathy.  Skin:     General: Skin is warm and dry.     Findings: No bruising.  Neurological:     General: No focal deficit present.     Mental Status: She is alert.  Psychiatric:        Mood and Affect: Mood normal.        Behavior: Behavior normal.     Lab Results  Component Value Date   WBC 6.9 11/17/2021   HGB 13.4 11/17/2021   HCT 39.4 11/17/2021   PLT 225.0 11/17/2021   GLUCOSE 134 (H) 09/09/2021   CHOL 158 11/17/2021   TRIG 178.0 (H) 11/17/2021   HDL 55.40 11/17/2021   LDLDIRECT 133.8 12/19/2012   LDLCALC 67 11/17/2021   ALT 17 09/04/2021   AST 26 09/04/2021   NA 134 (L) 09/09/2021   K 4.1 09/09/2021   CL 104 09/09/2021   CREATININE 0.62 09/09/2021   BUN 9 09/09/2021   CO2 23 09/09/2021   TSH 1.59 11/17/2021   INR 1.4 (H) 11/17/2021   HGBA1C 5.4 11/17/2021    ECHOCARDIOGRAM COMPLETE  Result Date: 09/09/2021    ECHOCARDIOGRAM REPORT   Patient Name:   Carrie [name] Date of Exam: 09/09/2021 Medical Rec #:  [id number]               Height:       61.0 in Accession #:    [id number]              Weight:       129.2 lb Date of Birth:  Oct 07, 1945                BSA:          1.569 m Patient Age:    18 years                BP:           114/64 mmHg Patient Gender: F                       HR:           93 bpm. Exam Location:  Inpatient Procedure: 2D Echo, Cardiac Doppler and Color Doppler Indications:    Post TAVR evaluation V43.3 / Z95.2  History:        Patient has prior history of Echocardiogram examinations, most                 recent 09/08/2021. CAD, Aortic Valve Disease, Arrythmias:Atrial                 Flutter; Risk Factors:Former Smoker. HOCM (hypertrophic  obstructive cardiomyopathy).                 Aortic Valve: 26 mm Sapien prosthetic, stented (TAVR) valve is                 present in the aortic position.  Sonographer:    Darlina Sicilian RDCS Referring Phys: [id number] [place]  1. Left ventricular ejection fraction, by estimation, is 70 to 75%. The  left ventricle has hyperdynamic function. The left ventricle has no regional wall motion abnormalities. There is moderate asymmetric left ventricular hypertrophy of the basal-septal  segment. Left ventricular diastolic parameters are consistent with Grade I diastolic dysfunction (impaired relaxation).  2. Right ventricular systolic function is normal. The right ventricular size is normal. There is mildly elevated pulmonary artery systolic pressure.  3. Left atrial size was severely dilated.  4. There is at most mild mitral stenosis. Mitral gradients are high due to tachycardia and severe mitral insufficiency. There is moderate to severe systolic anterior motion of the mitral valve with secondary mitral insufficiency and LV outflow obstruction. The mitral valve is degenerative. Severe mitral valve regurgitation. The mean mitral valve gradient is 8.5 mmHg with average heart rate of 105 bpm. Severe mitral annular calcification.  5. Aortic valve gradients and calculated aortic valve area are inaccurate due to subvalvular dynamic obstruction due to HOCM pathophysiology. Dimensionless valve index is consistent with normal prosthetic valve function.. The aortic valve has been repaired/replaced. Aortic valve regurgitation is not visualized. There is a 26 mm Sapien prosthetic (TAVR) valve present in the aortic position. Echo findings are consistent with normal structure and function of the aortic valve prosthesis. Aortic valve Vmax measures 3.30 m/s.  6. The inferior vena cava is normal in size with greater than 50% respiratory variability, suggesting right atrial pressure of 3 mmHg. Comparison(s): The degree of SAM of the mitral valve is worse, with increased severity of MR and increased subvalvular gradient (likely due to post-TAVR afterload reduction and tachycardia). FINDINGS  Left Ventricle: Left ventricular ejection fraction, by estimation, is 70 to 75%. The left ventricle has hyperdynamic function. The left ventricle  has no regional wall motion abnormalities. The left ventricular internal cavity size was normal in size. There is moderate asymmetric left ventricular hypertrophy of the basal-septal segment. Left ventricular diastolic parameters are consistent with Grade I diastolic dysfunction (impaired relaxation). Indeterminate filling pressures. Right Ventricle: The right ventricular size is normal. No increase in right ventricular wall thickness. Right ventricular systolic function is normal. There is mildly elevated pulmonary artery systolic pressure. The tricuspid regurgitant velocity is 2.85  m/s, and with an assumed right atrial pressure of 3 mmHg, the estimated right ventricular systolic pressure is 76.8 mmHg. Left Atrium: Left atrial size was severely dilated. Right Atrium: Right atrial size was normal in size. Pericardium: There is no evidence of pericardial effusion. Mitral Valve: There is at most mild mitral stenosis. Mitral gradients are high due to tachycardia and severe mitral insufficiency. There is moderate to severe systolic anterior motion of the mitral valve with secondary mitral insufficiency and LV outflow  obstruction. The mitral valve is degenerative in appearance. Severe mitral annular calcification. Severe mitral valve regurgitation. MV peak gradient, 15.2 mmHg. The mean mitral valve gradient is 8.5 mmHg with average heart rate of 105 bpm. Tricuspid Valve: The tricuspid valve is normal in structure. Tricuspid valve regurgitation is mild. Aortic Valve: Aortic valve gradients and calculated aortic valve area are inaccurate due to subvalvular dynamic obstruction due to  HOCM pathophysiology. Dimensionless valve index is consistent with normal prosthetic valve function. The aortic valve has been repaired/replaced. Aortic valve regurgitation is not visualized. Aortic valve mean gradient measures 21.5 mmHg. Aortic valve peak gradient measures 43.4 mmHg. Aortic valve area, by VTI measures 1.68 cm. There is a 26  mm Sapien prosthetic, stented (TAVR) valve present in the aortic position. Echo findings are consistent with normal structure and function of the aortic valve prosthesis. Pulmonic Valve: The pulmonic valve was grossly normal. Pulmonic valve regurgitation is not visualized. Aorta: The aortic root is normal in size and structure. Venous: A pattern of systolic flow reversal, suggestive of severe mitral regurgitation is recorded from the right upper pulmonary vein and the left upper pulmonary vein. The inferior vena cava is normal in size with greater than 50% respiratory variability, suggesting right atrial pressure of 3 mmHg. IAS/Shunts: No atrial level shunt detected by color flow Doppler.  LEFT VENTRICLE PLAX 2D LVIDd:         3.50 cm LVIDs:         2.60 cm LV PW:         1.20 cm LV IVS:        1.70 cm LVOT diam:     2.00 cm LV SV:         92 LV SV Index:   59 LVOT Area:     3.14 cm  RIGHT VENTRICLE RV S prime:     17.40 cm/s TAPSE (M-mode): 2.0 cm LEFT ATRIUM             Index        RIGHT ATRIUM          Index LA diam:        3.90 cm 2.49 cm/m   RA Area:     7.50 cm LA Vol (A2C):   61.5 ml 39.21 ml/m  RA Volume:   11.50 ml 7.33 ml/m LA Vol (A4C):   80.6 ml 51.38 ml/m LA Biplane Vol: 74.6 ml 47.56 ml/m  AORTIC VALVE                     PULMONIC VALVE AV Area (Vmax):    1.57 cm      PV Vmax:       2.88 m/s AV Area (Vmean):   1.61 cm      PV Peak grad:  33.2 mmHg AV Area (VTI):     1.68 cm AV Vmax:           329.50 cm/s AV Vmean:          210.250 cm/s AV VTI:            0.549 m AV Peak Grad:      43.4 mmHg AV Mean Grad:      21.5 mmHg LVOT Vmax:         165.00 cm/s LVOT Vmean:        108.000 cm/s LVOT VTI:          0.294 m LVOT/AV VTI ratio: 0.54 MITRAL VALVE                  TRICUSPID VALVE MV Area (PHT): 3.34 cm       TR Peak grad:   32.5 mmHg MV Area VTI:   2.84 cm       TR Vmax:        285.00 cm/s MV Peak grad:  15.2 mmHg MV Mean grad:  8.5 mmHg  SHUNTS MV Vmax:       1.95 m/s       Systemic VTI:   0.29 m MV Vmean:      137.5 cm/s     Systemic Diam: 2.00 cm MV Decel Time: 227 msec MR Peak grad:    171.6 mmHg MR Mean grad:    132.0 mmHg MR Vmax:         655.00 cm/s MR Vmean:        565.0 cm/s MR PISA:         5.09 cm MR PISA Eff ROA: 36 mm MR PISA Radius:  0.90 cm MV E velocity: 142.00 cm/s Dani Gobble Croitoru MD Electronically signed by Sanda Klein MD Signature Date/Time: 09/09/2021/12:26:35 PM    Final    ECHOCARDIOGRAM LIMITED  Result Date: 09/08/2021    ECHOCARDIOGRAM LIMITED REPORT   Patient Name:   [facility] SPRINKLE Asbill Date of Exam: 09/08/2021 Medical Rec #:  [id number]               Height:       61.0 in Accession #:    [id number]              Weight:       120.0 lb Date of Birth:  19-Jun-1945                BSA:          1.520 m Patient Age:    30 years                BP:           172/57 mmHg Patient Gender: F                       HR:           62 bpm. Exam Location:  Inpatient Procedure: Limited Echo, Cardiac Doppler and Color Doppler Indications:    Aortic stenosis I35.0  History:        Patient has prior history of Echocardiogram examinations, most                 recent 06/22/2021. CAD, Aortic Valve Disease and Mitral Valve                 Disease, Arrythmias:Atrial Flutter, Signs/Symptoms:Murmur; Risk                 Factors:Former Smoker. HOCM.                 Aortic Valve: 26 mm stented (TAVR) valve is present in the                 aortic position. Procedure Date: 09/08/2021.  Sonographer:    Darlina Sicilian RDCS Referring Phys: [place]                  Normal left ventricular regional wall motion and overall                 systolic function. Estimated LVEF 65%.                 Trileaflet aortic valve with severe calcific stenosis.                 Moderate aortic insufficiency.  Peak aortic valve gradient 70 mm Hg, mean gradient 47 mm Hg,                 dimensionless valve index 0.27, calculated valve area 0.75 cm sq                  (0.5 cm sq/m sq indexed for BSA).                 Moderate-to-severe centrally-directed mitral insufficiency.                 No pericardial effusion.                  PREOPERATIVE FINDINGS                  Hyperdynamic left ventricular systolic function. Estimated LVEF                 65-70%.                 There is systolic anterior motion of the mitral valve, but with                 very mild LVOT obstruction.                 Well-seated Medtronic stent-valve (TAVR). The valve was                 reexpanded due to moderate perivalvular leak. There appears to                 be only a trivial leak at the end of the procedure.                 Peak aortic valve gradient 13 mm Hg, mean gradient 7 mm Hg.                 Unchanged moderate to severe mitral insufficiency.                 No pericardial effusion. IMPRESSIONS  1. Left ventricular ejection fraction, by estimation, is 60 to 65%. The left ventricle has normal function. The left ventricle has no regional wall motion abnormalities. There is moderate concentric left ventricular hypertrophy.  2. Right ventricular systolic function is normal. The right ventricular size is normal.  3. The mitral valve is degenerative. Moderate to severe mitral valve regurgitation. Severe mitral annular calcification.  4. The aortic valve has been repaired/replaced. There is a 26 mm stented (TAVR) valve present in the aortic position. Procedure Date: 09/08/2021. FINDINGS  Left Ventricle: Left ventricular ejection fraction, by estimation, is 60 to 65%. The left ventricle has normal function. The left ventricle has no regional wall motion abnormalities. The left ventricular internal cavity size was normal in size. There is  moderate concentric left ventricular hypertrophy. Right Ventricle: The right ventricular size is normal. No increase in right ventricular wall thickness. Right ventricular systolic function is normal. Pericardium: There is no evidence of pericardial  effusion. Mitral Valve: The mitral valve is degenerative in appearance. Severe mitral annular calcification. Moderate to severe mitral valve regurgitation. Aortic Valve: The aortic valve has been repaired/replaced. Aortic regurgitation PHT measures 241 msec. Aortic valve mean gradient measures 7.0 mmHg. Aortic valve peak gradient measures 13.1 mmHg. Aortic valve area, by VTI measures 3.16 cm. There is a 26  mm stented (TAVR) valve present in the aortic position. Procedure Date: 09/08/2021. LEFT VENTRICLE PLAX 2D LVOT diam:  1.86 cm LV SV:         147 LV SV Index:   97 LVOT Area:     2.72 cm  AORTIC VALVE AV Area (Vmax):    2.96 cm AV Area (Vmean):   2.96 cm AV Area (VTI):     3.16 cm AV Vmax:           181.00 cm/s AV Vmean:          124.000 cm/s AV VTI:            0.465 m AV Peak Grad:      13.1 mmHg AV Mean Grad:      7.0 mmHg LVOT Vmax:         197.00 cm/s LVOT Vmean:        135.000 cm/s LVOT VTI:          0.540 m LVOT/AV VTI ratio: 1.16 AI PHT:            241 msec MR Peak grad:    215.5 mmHg MR Mean grad:    129.0 mmHg   SHUNTS MR Vmax:         734.00 cm/s  Systemic VTI:  0.54 m MR Vmean:        534.0 cm/s   Systemic Diam: 1.86 cm MR PISA:         1.57 cm MR PISA Eff ROA: 5 mm MR PISA Radius:  0.50 cm Dani Gobble Croitoru MD Electronically signed by Sanda Klein MD Signature Date/Time: 09/08/2021/5:31:38 PM    Final    Structural Heart Procedure  Result Date: 09/08/2021 See surgical note for result.   Assessment & Plan:   Jeanine was seen today for annual exam, hypertension and hyperlipidemia.  Diagnoses and all orders for this visit:  Essential hypertension- Her blood pressure is adequately well controlled. -     CBC with Differential/Platelet; Future -     TSH; Future -     TSH -     CBC with Differential/Platelet  Pure hypercholesterolemia- LDL goal achieved. Doing well on the statin  -     Lipid panel; Future -     TSH; Future -     TSH -     Lipid panel  Right-sided epistaxis-  Her coags are not alarming and are consistent with DOAC therapy. -     CBC with Differential/Platelet; Future -     Protime-INR; Future -     APTT; Future -     Ambulatory referral to ENT -     APTT -     Protime-INR -     CBC with Differential/Platelet  Encounter for general adult medical examination with abnormal findings- Exam completed, labs reviewed, vaccines are up-to-date, no cancer screenings indicated, patient education was given.  Chronic hyperglycemia- Her A1C is normal. -     Hemoglobin A1c; Future -     Hemoglobin A1c   I am having Truitt Leep. [place] maintain her Vitamin D3, denosumab, cyanocobalamin, rosuvastatin, metoprolol succinate, Polyethyl Glycol-Propyl Glycol (SYSTANE OP), and Eliquis.  No orders of the defined types were placed in this encounter.    Follow-up: Return in about 6 months (around 05/20/2022).  Scarlette Calico, MD

## 2021-11-17 NOTE — Patient Instructions (Signed)

## 2021-12-21 ENCOUNTER — Encounter: Payer: Self-pay | Admitting: Emergency Medicine

## 2021-12-21 ENCOUNTER — Ambulatory Visit (INDEPENDENT_AMBULATORY_CARE_PROVIDER_SITE_OTHER): Payer: Medicare HMO

## 2021-12-21 ENCOUNTER — Ambulatory Visit (INDEPENDENT_AMBULATORY_CARE_PROVIDER_SITE_OTHER): Payer: Medicare HMO | Admitting: Emergency Medicine

## 2021-12-21 VITALS — BP 132/74 | HR 76 | Temp 98.1°F | Ht 61.0 in | Wt 122.0 lb

## 2021-12-21 DIAGNOSIS — R1319 Other dysphagia: Secondary | ICD-10-CM

## 2021-12-21 DIAGNOSIS — R630 Anorexia: Secondary | ICD-10-CM

## 2021-12-21 LAB — URINALYSIS
Bilirubin Urine: NEGATIVE
Ketones, ur: NEGATIVE
Leukocytes,Ua: NEGATIVE
Nitrite: NEGATIVE
Specific Gravity, Urine: 1.01 (ref 1.000–1.030)
Total Protein, Urine: NEGATIVE
Urine Glucose: NEGATIVE
Urobilinogen, UA: 0.2 (ref 0.0–1.0)
pH: 6 (ref 5.0–8.0)

## 2021-12-21 LAB — COMPREHENSIVE METABOLIC PANEL
ALT: 15 U/L (ref 0–35)
AST: 25 U/L (ref 0–37)
Albumin: 4 g/dL (ref 3.5–5.2)
Alkaline Phosphatase: 78 U/L (ref 39–117)
BUN: 10 mg/dL (ref 6–23)
CO2: 26 mEq/L (ref 19–32)
Calcium: 8.8 mg/dL (ref 8.4–10.5)
Chloride: 103 mEq/L (ref 96–112)
Creatinine, Ser: 0.63 mg/dL (ref 0.40–1.20)
GFR: 86.04 mL/min (ref >60.00)
Glucose, Bld: 75 mg/dL (ref 70–99)
Potassium: 3.4 mEq/L — ABNORMAL LOW (ref 3.5–5.1)
Sodium: 137 mEq/L (ref 135–145)
Total Bilirubin: 0.5 mg/dL (ref 0.2–1.2)
Total Protein: 7.5 g/dL (ref 6.0–8.3)

## 2021-12-21 LAB — CBC WITH DIFFERENTIAL/PLATELET
Basophils Absolute: 0.1 10*3/uL (ref 0.0–0.1)
Basophils Relative: 0.9 % (ref 0.0–3.0)
Eosinophils Absolute: 0.1 10*3/uL (ref 0.0–0.7)
Eosinophils Relative: 1.2 % (ref 0.0–5.0)
HCT: 38.2 % (ref 36.0–46.0)
Hemoglobin: 12.6 g/dL (ref 12.0–15.0)
Lymphocytes Relative: 35.5 % (ref 12.0–46.0)
Lymphs Abs: 2.6 10*3/uL (ref 0.7–4.0)
MCHC: 33.1 g/dL (ref 30.0–36.0)
MCV: 93.7 fl (ref 78.0–100.0)
Monocytes Absolute: 0.8 10*3/uL (ref 0.1–1.0)
Monocytes Relative: 10.6 % (ref 3.0–12.0)
Neutro Abs: 3.8 10*3/uL (ref 1.4–7.7)
Neutrophils Relative %: 51.8 % (ref 43.0–77.0)
Platelets: 265 10*3/uL (ref 150.0–400.0)
RBC: 4.07 Mil/uL (ref 3.87–5.11)
RDW: 14.6 % (ref 11.5–15.5)
WBC: 7.3 10*3/uL (ref 4.0–10.5)

## 2021-12-21 NOTE — Assessment & Plan Note (Signed)
Differential diagnosis discussed with patient.   Needs upper endoscopy GI referral placed today.

## 2021-12-21 NOTE — Progress Notes (Signed)
Carrie [name] 76 y.o.   Chief Complaint  Patient presents with   lack of appetite     Lost of appetite being going on for over a month now, patient also has difficulty swallowing food     HISTORY OF PRESENT ILLNESS: This is a 76 y.o. female complaining of loss of appetite for over a month now. Also noted some difficulty to swallow solids. Non-smoker.  No other associated symptoms.  HPI   Prior to Admission medications   Medication Sig Start Date End Date Taking? Authorizing Provider  Cholecalciferol (VITAMIN D3) 1000 UNITS CAPS Take 1 capsule by mouth daily.   Yes [provider]  denosumab (PROLIA) 60 MG/ML SOSY injection Inject 60 mg into the skin every 6 (six) months.   Yes [provider]  ELIQUIS 5 MG TABS tablet Take 1 tablet (5 mg total) by mouth 2 (two) times daily. 09/17/21  Yes [name], MD  metoprolol succinate (TOPROL-XL) 50 MG 24 hr tablet Take 1 tablet (50 mg total) by mouth daily. Take with or immediately following a meal. 05/11/21  Yes [name], MD  Polyethyl Glycol-Propyl Glycol (SYSTANE OP) Apply 1 drop to eye 2 (two) times daily as needed (dry eyes).   Yes [provider]  rosuvastatin (CRESTOR) 20 MG tablet Take 1 tablet (20 mg total) by mouth daily. 05/11/21  Yes [name], MD  vitamin B-12 (CYANOCOBALAMIN) 1000 MCG tablet Take 1,000 mcg by mouth daily.   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   Mitral valve  disease 09/08/2021   GERD without esophagitis 01/05/2019   Pharyngeal dysphagia 01/04/2019   Visit for screening mammogram 01/04/2019   Essential hypertension 02/13/2018   Paroxysmal atrial fibrillation ([place]) 08/23/2017   Coronary artery disease involving native coronary artery of native heart without angina pectoris 02/24/2017   Pure hypercholesterolemia 02/24/2017   HOCM (hypertrophic obstructive cardiomyopathy) ([place]) 11/22/2016   Paroxysmal atrial flutter ([place]) 07/08/2016   Varicose veins of lower extremities with other complications 19/75/8832   Allergic rhinitis 12/28/2012   Routine general medical examination at a health care facility 06/22/2012   Severe aortic stenosis 09/05/2010   Coronary artery disease due to lipid rich plaque 06/26/2009   SUPRAVENTRICULAR TACHYCARDIA 06/23/2009   Hyperlipidemia 03/06/2009   Osteoporosis 03/06/2009    Past Medical History:  Diagnosis Date   Coronary artery disease    a. Prior LHC (07/24/03): LMCA heavily calcified without significant stenosis, LAD with heavy calcification proximally. 50-60% stenosis just distal to D2, LCx calcified proximally without significant stenosis, Dominant RCA with moderate proximal calcification and mild luminal irregularities throughout, LVEDP 12, LVEF 54%   Diastolic dysfunction without heart failure    Former tobacco use    Headache    HOCM (hypertrophic obstructive cardiomyopathy) ([facility])    Mild AI (aortic insufficiency)    Osteoporosis, unspecified    Other and unspecified hyperlipidemia    Paroxysmal atrial flutter ([facility])    Pneumonia    S/P TAVR (transcatheter aortic  valve replacement) 09/08/2021   s/p TAVR with a 68m Medtronic FX by Dr. CBurt Knack& Dr. BCyndia Bent  Severe aortic stenosis    Wears hearing aid    left    Past Surgical History:  Procedure Laterality Date   ATRIAL ABLATION SURGERY  04/27/2003   BREAST SURGERY  04/26/1980   Benign tumor removed RT   BUNIONECTOMY  04/07/2012   Procedure:  BUNIONECTOMY;  Surgeon: JAlta Corning MD;  [facility]  Service: Orthopedics;  Laterality: Left;  CHEVRON OSTEOTOMY LEFT FOOT    CARDIAC CATHETERIZATION  04/27/2003   EYE SURGERY     bilateral cataracts   GANGLION CYST EXCISION     left wrist   HERNIA REPAIR  04/26/1988   rt ing   INTRAOPERATIVE TRANSTHORACIC ECHOCARDIOGRAM N/A 09/08/2021   Procedure: INTRAOPERATIVE TRANSTHORACIC ECHOCARDIOGRAM;  Surgeon: CSherren Mocha MD;  [facility];  Service: Open Heart Surgery;  Laterality: N/A;   RIGHT/LEFT HEART CATH AND CORONARY ANGIOGRAPHY N/A 04/22/2021   Procedure: RIGHT/LEFT HEART CATH AND CORONARY ANGIOGRAPHY;  Surgeon: CSherren Mocha MD;  [facility];  Service: Cardiovascular;  Laterality: N/A;   TONSILLECTOMY     TRANSCATHETER AORTIC VALVE REPLACEMENT, TRANSFEMORAL N/A 09/08/2021   Procedure: Transcatheter Aortic Valve Replacement, Transfemoral;  Surgeon: CSherren Mocha MD;  [facility];  Service: Open Heart Surgery;  Laterality: N/A;   TUBAL LIGATION      Social History   Socioeconomic History   Marital status: Divorced    Spouse name: Not on file   Number of children: 2   Years of education: Not on file   Highest education level: Not on file  Occupational History   Occupation: retired  Tobacco Use   Smoking status: Former    Types: Cigarettes    Quit date: 04/05/1999    Years since quitting: 22.7   Smokeless tobacco: Never   Tobacco comments:    Regular Exercise-No  Vaping Use   Vaping Use: Never used  Substance and Sexual Activity   Alcohol use: Yes    Alcohol/week: 7.0 standard drinks of alcohol    Types: 7 Glasses of wine per week    Comment: 1 drink per week   Drug use: No   Sexual activity: Not Currently    Birth control/protection: Post-menopausal  Other Topics Concern   Not on file  Social History Narrative   Not on file   Social Determinants of Health   Financial Resource  Strain: Low Risk  (11/09/2021)   Overall Financial Resource Strain (CARDIA)    Difficulty of Paying Living Expenses: Not hard at all  Food Insecurity: No Food Insecurity (11/09/2021)   Hunger Vital Sign    Worried About Running Out of Food in the Last Year: Never true    Ran Out of Food in the Last Year: Never true  Transportation Needs: No Transportation Needs (11/09/2021)   PRAPARE - THydrologist(Medical): No    Lack of Transportation (Non-Medical): No  Physical Activity: Sufficiently Active (11/09/2021)   Exercise Vital Sign    Days of Exercise per Week: 5 days    Minutes of Exercise per Session: 30 min  Recent Concern: Physical Activity - Inactive (11/09/2021)   Exercise Vital Sign    Days of Exercise per Week: 5 days    Minutes of Exercise per Session: 0 min  Stress: No Stress Concern Present (11/09/2021)   [employer]  [employer] - Occupational Stress Questionnaire    Feeling of Stress : Not at all  Social Connections: Moderately Integrated (11/09/2021)   Social Connection and Isolation Panel [NHANES]    Frequency of Communication with Friends and Family: More than three times a week    Frequency of Social Gatherings with Friends and Family: Once a week    Attends Religious Services: More than 4 times per year    Active Member of [employer] or Organizations: Yes    Attends Music therapist: More than 4 times per year    Marital Status: Divorced  Intimate Partner Violence: Not At Risk (11/09/2021)   Humiliation, Afraid, Rape, and Kick questionnaire    Fear of Current or Ex-Partner: No    Emotionally Abused: No    Physically Abused: No    Sexually Abused: No    Family History  Problem Relation Age of Onset   Heart disease Mother 85   Congestive Heart Failure Mother    Dementia Mother    Hypertension Other    Valvular heart disease Father        Had valves replaced   Colon cancer Neg Hx    Rectal cancer Neg Hx       Review of Systems  Constitutional: Negative.  Negative for chills, fever, malaise/fatigue and weight loss.  HENT: Negative.  Negative for congestion and sore throat.   Respiratory: Negative.  Negative for cough and shortness of breath.   Cardiovascular: Negative.  Negative for chest pain and palpitations.  Gastrointestinal:  Negative for abdominal pain, blood in stool, diarrhea, melena, nausea and vomiting.  Genitourinary: Negative.   Skin: Negative.  Negative for rash.  Neurological: Negative.  Negative for dizziness and headaches.  All other systems reviewed and are negative.  Today's Vitals   12/21/21 1437  BP: 132/74  Pulse: 76  Temp: 98.1 F (36.7 C)  TempSrc: Oral  SpO2: 97%  Weight: 122 lb (55.3 kg)  Height: '5\' 1"'$  (1.549 m)   Body mass index is 23.05 kg/m. Wt Readings from Last 3 Encounters:  12/21/21 122 lb (55.3 kg)  11/17/21 122 lb (55.3 kg)  11/09/21 121 lb 12.8 oz (55.2 kg)     Physical Exam Vitals reviewed.  Constitutional:      Appearance: Normal appearance.  HENT:     Head: Normocephalic.     Mouth/Throat:     Mouth: Mucous membranes are moist.     Pharynx: Oropharynx is clear.  Eyes:     Extraocular Movements: Extraocular movements intact.     Conjunctiva/sclera: Conjunctivae normal.     Pupils: Pupils are equal, round, and reactive to light.  Cardiovascular:     Rate and Rhythm: Normal rate and regular rhythm.     Pulses: Normal pulses.     Heart sounds: Normal heart sounds.  Pulmonary:     Effort: Pulmonary effort is normal.     Breath sounds: Normal breath sounds.  Abdominal:     Palpations: Abdomen is soft.     Tenderness: There is no abdominal tenderness.  Musculoskeletal:     Cervical back: No tenderness.     Right lower leg: No edema.     Left lower leg: No edema.  Lymphadenopathy:     Cervical: No cervical adenopathy.  Skin:    General: Skin is warm and dry.     Capillary Refill: Capillary refill takes less than 2 seconds.   Neurological:     General: No focal deficit present.  Mental Status: She is alert and oriented to person, place, and time.  Psychiatric:        Mood and Affect: Mood normal.        Behavior: Behavior normal.    DG Chest 2 View  Result Date: 12/21/2021 CLINICAL DATA:  Loss of appetite EXAM: CHEST - 2 VIEW COMPARISON:  Radiograph 09/04/2021 FINDINGS: Interval transcatheter aortic valve replacement. Cardiomediastinal silhouette is unchanged in size. No focal airspace consolidation. No pleural effusion. No pneumothorax. No acute osseous abnormality. Thoracic spondylosis. Unchanged multiple compression deformities of the thoracic spine. IMPRESSION: No evidence of acute cardiopulmonary disease. Electronically Signed   By: Maurine Simmering M.D.   On: 12/21/2021 15:22     ASSESSMENT & PLAN: A total of 43 minutes was spent with the patient and counseling/coordination of care regarding preparing for this visit, review of most recent office visit notes and available medical records, review of multiple chronic medical conditions and their management, review of all medications, review of today's chest x-ray, differential diagnosis and need for work-up, prognosis, documentation and need for follow-up.  Problem List Items Addressed This Visit       Digestive   Esophageal dysphagia    Differential diagnosis discussed with patient.   Needs upper endoscopy GI referral placed today.      Relevant Orders   CBC with Differential/Platelet   Comprehensive metabolic panel   DG Chest 2 View (Completed)   Urinalysis     Other   Loss of appetite - Primary    Differential diagnosis including possibility of malignancy discussed with patient. Needs work-up. Chest x-ray done today.  No signs of malignancy. Blood work done today. No active depression.  Not losing weight. No new medications. Advised to follow-up with PCP within the next several weeks.      Relevant Orders   CBC with Differential/Platelet    Comprehensive metabolic panel   DG Chest 2 View (Completed)   Urinalysis   Patient Instructions  Health Maintenance After Age 33 After age 68, you are at a higher risk for certain long-term diseases and infections as well as injuries from falls. Falls are a major cause of broken bones and head injuries in people who are older than age 10. Getting regular preventive care can help to keep you healthy and well. Preventive care includes getting regular testing and making lifestyle changes as recommended by your health care provider. Talk with your health care provider about: Which screenings and tests you should have. A screening is a test that checks for a disease when you have no symptoms. A diet and exercise plan that is right for you. What should I know about screenings and tests to prevent falls? Screening and testing are the best ways to find a health problem early. Early diagnosis and treatment give you the best chance of managing medical conditions that are common after age 7. Certain conditions and lifestyle choices may make you more likely to have a fall. Your health care provider may recommend: Regular vision checks. Poor vision and conditions such as cataracts can make you more likely to have a fall. If you wear glasses, make sure to get your prescription updated if your vision changes. Medicine review. Work with your health care provider to regularly review all of the medicines you are taking, including over-the-counter medicines. Ask your health care provider about any side effects that may make you more likely to have a fall. Tell your health care provider if any medicines that you take  make you feel dizzy or sleepy. Strength and balance checks. Your health care provider may recommend certain tests to check your strength and balance while standing, walking, or changing positions. Foot health exam. Foot pain and numbness, as well as not wearing proper footwear, can make you more likely to  have a fall. Screenings, including: Osteoporosis screening. Osteoporosis is a condition that causes the bones to get weaker and break more easily. Blood pressure screening. Blood pressure changes and medicines to control blood pressure can make you feel dizzy. Depression screening. You may be more likely to have a fall if you have a fear of falling, feel depressed, or feel unable to do activities that you used to do. Alcohol use screening. Using too much alcohol can affect your balance and may make you more likely to have a fall. Follow these instructions at home: Lifestyle Do not drink alcohol if: Your health care provider tells you not to drink. If you drink alcohol: Limit how much you have to: 0-1 drink a day for women. 0-2 drinks a day for men. Know how much alcohol is in your drink. In the [place] one drink equals one 12 oz bottle of beer (355 mL), one 5 oz glass of wine (148 mL), or one 1 oz glass of hard liquor (44 mL). Do not use any products that contain nicotine or tobacco. These products include cigarettes, chewing tobacco, and vaping devices, such as e-cigarettes. If you need help quitting, ask your health care provider. Activity  Follow a regular exercise program to stay fit. This will help you maintain your balance. Ask your health care provider what types of exercise are appropriate for you. If you need a cane or walker, use it as recommended by your health care provider. Wear supportive shoes that have nonskid soles. Safety  Remove any tripping hazards, such as rugs, cords, and clutter. Install safety equipment such as grab bars in bathrooms and safety rails on stairs. Keep rooms and walkways well-lit. General instructions Talk with your health care provider about your risks for falling. Tell your health care provider if: You fall. Be sure to tell your health care provider about all falls, even ones that seem minor. You feel dizzy, tiredness (fatigue), or  off-balance. Take over-the-counter and prescription medicines only as told by your health care provider. These include supplements. Eat a healthy diet and maintain a healthy weight. A healthy diet includes low-fat dairy products, low-fat (lean) meats, and fiber from whole grains, beans, and lots of fruits and vegetables. Stay current with your vaccines. Schedule regular health, dental, and eye exams. Summary Having a healthy lifestyle and getting preventive care can help to protect your health and wellness after age 98. Screening and testing are the best way to find a health problem early and help you avoid having a fall. Early diagnosis and treatment give you the best chance for managing medical conditions that are more common for people who are older than age 68. Falls are a major cause of broken bones and head injuries in people who are older than age 68. Take precautions to prevent a fall at home. Work with your health care provider to learn what changes you can make to improve your health and wellness and to prevent falls. This information is not intended to replace advice given to you by your health care provider. Make sure you discuss any questions you have with your health care provider. Document Revised: 09/01/2020 Document Reviewed: 09/01/2020 Elsevier Patient Education  2023 Elsevier  Inc.    Agustina Caroli, MD [facility]

## 2021-12-21 NOTE — Assessment & Plan Note (Signed)
Differential diagnosis including possibility of malignancy discussed with patient. Needs work-up. Chest x-ray done today.  No signs of malignancy. Blood work done today. No active depression.  Not losing weight. No new medications. Advised to follow-up with PCP within the next several weeks.

## 2021-12-21 NOTE — Patient Instructions (Signed)
Health Maintenance After Age 76 After age 76, you are at a higher risk for certain long-term diseases and infections as well as injuries from falls. Falls are a major cause of broken bones and head injuries in people who are older than age 76. Getting regular preventive care can help to keep you healthy and well. Preventive care includes getting regular testing and making lifestyle changes as recommended by your health care provider. Talk with your health care provider about: Which screenings and tests you should have. A screening is a test that checks for a disease when you have no symptoms. A diet and exercise plan that is right for you. What should I know about screenings and tests to prevent falls? Screening and testing are the best ways to find a health problem early. Early diagnosis and treatment give you the best chance of managing medical conditions that are common after age 76. Certain conditions and lifestyle choices may make you more likely to have a fall. Your health care provider may recommend: Regular vision checks. Poor vision and conditions such as cataracts can make you more likely to have a fall. If you wear glasses, make sure to get your prescription updated if your vision changes. Medicine review. Work with your health care provider to regularly review all of the medicines you are taking, including over-the-counter medicines. Ask your health care provider about any side effects that may make you more likely to have a fall. Tell your health care provider if any medicines that you take make you feel dizzy or sleepy. Strength and balance checks. Your health care provider may recommend certain tests to check your strength and balance while standing, walking, or changing positions. Foot health exam. Foot pain and numbness, as well as not wearing proper footwear, can make you more likely to have a fall. Screenings, including: Osteoporosis screening. Osteoporosis is a condition that causes  the bones to get weaker and break more easily. Blood pressure screening. Blood pressure changes and medicines to control blood pressure can make you feel dizzy. Depression screening. You may be more likely to have a fall if you have a fear of falling, feel depressed, or feel unable to do activities that you used to do. Alcohol use screening. Using too much alcohol can affect your balance and may make you more likely to have a fall. Follow these instructions at home: Lifestyle Do not drink alcohol if: Your health care provider tells you not to drink. If you drink alcohol: Limit how much you have to: 0-1 drink a day for women. 0-2 drinks a day for men. Know how much alcohol is in your drink. In the [place] one drink equals one 12 oz bottle of beer (355 mL), one 5 oz glass of wine (148 mL), or one 1 oz glass of hard liquor (44 mL). Do not use any products that contain nicotine or tobacco. These products include cigarettes, chewing tobacco, and vaping devices, such as e-cigarettes. If you need help quitting, ask your health care provider. Activity  Follow a regular exercise program to stay fit. This will help you maintain your balance. Ask your health care provider what types of exercise are appropriate for you. If you need a cane or walker, use it as recommended by your health care provider. Wear supportive shoes that have nonskid soles. Safety  Remove any tripping hazards, such as rugs, cords, and clutter. Install safety equipment such as grab bars in bathrooms and safety rails on stairs. Keep rooms and walkways   well-lit. General instructions Talk with your health care provider about your risks for falling. Tell your health care provider if: You fall. Be sure to tell your health care provider about all falls, even ones that seem minor. You feel dizzy, tiredness (fatigue), or off-balance. Take over-the-counter and prescription medicines only as told by your health care provider. These include  supplements. Eat a healthy diet and maintain a healthy weight. A healthy diet includes low-fat dairy products, low-fat (lean) meats, and fiber from whole grains, beans, and lots of fruits and vegetables. Stay current with your vaccines. Schedule regular health, dental, and eye exams. Summary Having a healthy lifestyle and getting preventive care can help to protect your health and wellness after age 76. Screening and testing are the best way to find a health problem early and help you avoid having a fall. Early diagnosis and treatment give you the best chance for managing medical conditions that are more common for people who are older than age 76. Falls are a major cause of broken bones and head injuries in people who are older than age 76. Take precautions to prevent a fall at home. Work with your health care provider to learn what changes you can make to improve your health and wellness and to prevent falls. This information is not intended to replace advice given to you by your health care provider. Make sure you discuss any questions you have with your health care provider. Document Revised: 09/01/2020 Document Reviewed: 09/01/2020 Elsevier Patient Education  2023 Elsevier Inc.  

## 2022-01-28 DIAGNOSIS — Z1231 Encounter for screening mammogram for malignant neoplasm of breast: Secondary | ICD-10-CM | POA: Diagnosis not present

## 2022-01-28 LAB — HM MAMMOGRAPHY

## 2022-02-01 ENCOUNTER — Ambulatory Visit: Payer: Medicare HMO | Admitting: Internal Medicine

## 2022-02-01 ENCOUNTER — Telehealth: Payer: Self-pay | Admitting: Cardiovascular Disease

## 2022-02-01 NOTE — Telephone Encounter (Signed)
Pt c/o medication issue:  1. Name of Medication:   ELIQUIS 5 MG TABS tablet    2. How are you currently taking this medication (dosage and times per day)? Take 1 tablet (5 mg total) by mouth 2 (two) times daily.  3. Are you having a reaction (difficulty breathing--STAT)? No  4. What is your medication issue? Pt would like a callback regarding [employer] paperwork for medication. Please advise

## 2022-02-01 NOTE — Telephone Encounter (Signed)
Patient was returning call. Please advise ?

## 2022-02-02 NOTE — Telephone Encounter (Signed)
**Note De-Identified  Obfuscation** The pt lives 30 miles from the office and prefers not to have to drive here to drop off her BMSPA[employer] application off for Eliquis.  The pt is aware to mail her part of her application to [facility] and that we will fax a signed by Dr Burt Knack, providers page of a BMSPAF application to them with a note written on the cover letter advising [employer] that the pt is mailing her part of her application to them herself.  The pt verbalized understanding, is in agreement with plan, and thanked me for our assistance.

## 2022-02-02 NOTE — Telephone Encounter (Signed)
**Note De-Identified  Obfuscation** No answer so I left a message asking the pt to call Jeani Hawking back at Dr Leodis Liverpool' office [phone].

## 2022-03-08 ENCOUNTER — Encounter: Payer: Self-pay | Admitting: Internal Medicine

## 2022-03-08 ENCOUNTER — Ambulatory Visit (INDEPENDENT_AMBULATORY_CARE_PROVIDER_SITE_OTHER): Payer: Medicare HMO | Admitting: Internal Medicine

## 2022-03-08 VITALS — BP 134/78 | HR 96 | Temp 98.1°F | Ht 61.0 in | Wt 121.0 lb

## 2022-03-08 DIAGNOSIS — E876 Hypokalemia: Secondary | ICD-10-CM | POA: Insufficient documentation

## 2022-03-08 DIAGNOSIS — Z23 Encounter for immunization: Secondary | ICD-10-CM | POA: Diagnosis not present

## 2022-03-08 DIAGNOSIS — I480 Paroxysmal atrial fibrillation: Secondary | ICD-10-CM | POA: Diagnosis not present

## 2022-03-08 DIAGNOSIS — I10 Essential (primary) hypertension: Secondary | ICD-10-CM

## 2022-03-08 MED ORDER — POTASSIUM CHLORIDE CRYS ER 10 MEQ PO TBCR: 10.0000 meq | EXTENDED_RELEASE_TABLET | Freq: Two times a day (BID) | ORAL | 0 refills | Status: DC

## 2022-03-08 NOTE — Patient Instructions (Signed)
Hypokalemia Hypokalemia means that the amount of potassium in the blood is lower than normal. Potassium is a mineral (electrolyte) that helps regulate the amount of fluid in the body. It also stimulates muscle tightening (contraction) and helps nerves work properly. Normally, most of the body's potassium is inside cells, and only a very small amount is in the blood. Because the amount in the blood is so small, minor changes to potassium levels in the blood can be life-threatening. What are the causes? This condition may be caused by: Antibiotic medicine. Diarrhea or vomiting. Taking too much of a medicine that helps you have a bowel movement (laxative) can cause diarrhea and lead to hypokalemia. Chronic kidney disease (CKD). Medicines that help the body get rid of excess fluid (diuretics). Eating disorders, such as anorexia or bulimia. Low magnesium levels in the body. Sweating a lot. What are the signs or symptoms? Symptoms of this condition include: Weakness. Constipation. Fatigue. Muscle cramps. Mental confusion. Skipped heartbeats or irregular heartbeat (palpitations). Tingling or numbness. How is this diagnosed? This condition is diagnosed with a blood test. How is this treated? This condition may be treated by: Taking potassium supplements. Adjusting the medicines that you take. Eating more foods that contain a lot of potassium. If your potassium level is very low, you may need to get potassium through an IV and be monitored in the hospital. Follow these instructions at home: Eating and drinking  Eat a healthy diet. A healthy diet includes fresh fruits and vegetables, whole grains, healthy fats, and lean proteins. If told, eat more foods that contain a lot of potassium. These include: Nuts, such as peanuts and pistachios. Seeds, such as sunflower seeds and pumpkin seeds. Peas, lentils, and lima beans. Whole grain and bran cereals and breads. Fresh fruits and vegetables,  such as apricots, avocado, bananas, cantaloupe, kiwi, oranges, tomatoes, asparagus, and potatoes. Juices, such as orange, tomato, and prune. Lean meats, including fish. Milk and milk products, such as yogurt. General instructions Take over-the-counter and prescription medicines only as told by your health care provider. This includes vitamins, natural food products, and supplements. Keep all follow-up visits. This is important. Contact a health care provider if: You have weakness that gets worse. You feel your heart pounding or racing. You vomit. You have diarrhea. You have diabetes and you have trouble keeping your blood sugar in your target range. Get help right away if: You have chest pain. You have shortness of breath. You have vomiting or diarrhea that lasts for more than 2 days. You faint. These symptoms may be an emergency. Get help right away. Call 911. Do not wait to see if the symptoms will go away. Do not drive yourself to the hospital. Summary Hypokalemia means that the amount of potassium in the blood is lower than normal. This condition is diagnosed with a blood test. Hypokalemia may be treated by taking potassium supplements, adjusting the medicines that you take, or eating more foods that are high in potassium. If your potassium level is very low, you may need to get potassium through an IV and be monitored in the hospital. This information is not intended to replace advice given to you by your health care provider. Make sure you discuss any questions you have with your health care provider. Document Revised: 12/25/2020 Document Reviewed: 12/25/2020 Elsevier Patient Education  2023 [employer] Inc[employer]  

## 2022-03-08 NOTE — Progress Notes (Unsigned)
Subjective:  Patient ID: Carrie [name], female    DOB: 11-18-1945  Age: 76 y.o. MRN: [id number]  CC: No chief complaint on file.   HPI Carrie [name] presents for ***  Outpatient Medications Prior to Visit  Medication Sig Dispense Refill   Cholecalciferol (VITAMIN D3) 1000 UNITS CAPS Take 1 capsule by mouth daily.     denosumab (PROLIA) 60 MG/ML SOSY injection Inject 60 mg into the skin every 6 (six) months.     ELIQUIS 5 MG TABS tablet Take 1 tablet (5 mg total) by mouth 2 (two) times daily. 180 tablet 1   metoprolol succinate (TOPROL-XL) 50 MG 24 hr tablet Take 1 tablet (50 mg total) by mouth daily. Take with or immediately following a meal. 90 tablet 3   Polyethyl Glycol-Propyl Glycol (SYSTANE OP) Apply 1 drop to eye 2 (two) times daily as needed (dry eyes).     rosuvastatin (CRESTOR) 20 MG tablet Take 1 tablet (20 mg total) by mouth daily. 90 tablet 3   vitamin B-12 (CYANOCOBALAMIN) 1000 MCG tablet Take 1,000 mcg by mouth daily.     No facility-administered medications prior to visit.    ROS Review of Systems  Objective:  BP 134/78 (BP Location: Left Arm, Patient Position: Sitting, Cuff Size: Large)   Pulse 96   Temp 98.1 F (36.7 C) (Oral)   Ht '5\' 1"'$  (1.549 m)   Wt 121 lb (54.9 kg)   SpO2 97%   BMI 22.86 kg/m   BP Readings from Last 3 Encounters:  03/08/22 134/78  12/21/21 132/74  11/17/21 124/70    Wt Readings from Last 3 Encounters:  03/08/22 121 lb (54.9 kg)  12/21/21 122 lb (55.3 kg)  11/17/21 122 lb (55.3 kg)    Physical Exam Cardiovascular:     Rate and Rhythm: Normal rate and regular rhythm.     Heart sounds: Murmur heard.     Systolic murmur is present with a grade of 4/6.     No diastolic murmur is present.  Musculoskeletal:     Right lower leg: No edema.     Left lower leg: No edema.     Lab Results  Component Value Date   WBC 7.3 12/21/2021   HGB 12.6 12/21/2021   HCT 38.2 12/21/2021   PLT 265.0 12/21/2021   GLUCOSE  75 12/21/2021   CHOL 158 11/17/2021   TRIG 178.0 (H) 11/17/2021   HDL 55.40 11/17/2021   LDLDIRECT 133.8 12/19/2012   LDLCALC 67 11/17/2021   ALT 15 12/21/2021   AST 25 12/21/2021   NA 137 12/21/2021   K 3.4 (L) 12/21/2021   CL 103 12/21/2021   CREATININE 0.63 12/21/2021   BUN 10 12/21/2021   CO2 26 12/21/2021   TSH 1.59 11/17/2021   INR 1.4 (H) 11/17/2021   HGBA1C 5.4 11/17/2021    ECHOCARDIOGRAM COMPLETE  Result Date: 09/09/2021    ECHOCARDIOGRAM REPORT   Patient Name:   Carrie [name] Date of Exam: 09/09/2021 Medical Rec #:  [id number]               Height:       61.0 in Accession #:    [id number]              Weight:       129.2 lb Date of Birth:  1945/11/28                BSA:  1.569 m Patient Age:    52 years                BP:           114/64 mmHg Patient Gender: F                       HR:           93 bpm. Exam Location:  Inpatient Procedure: 2D Echo, Cardiac Doppler and Color Doppler Indications:    Post TAVR evaluation V43.3 / Z95.2  History:        Patient has prior history of Echocardiogram examinations, most                 recent 09/08/2021. CAD, Aortic Valve Disease, Arrythmias:Atrial                 Flutter; Risk Factors:Former Smoker. HOCM (hypertrophic                 obstructive cardiomyopathy).                 Aortic Valve: 26 mm Sapien prosthetic, stented (TAVR) valve is                 present in the aortic position.  Sonographer:    Darlina Sicilian RDCS Referring Phys: [id number] [place]  1. Left ventricular ejection fraction, by estimation, is 70 to 75%. The left ventricle has hyperdynamic function. The left ventricle has no regional wall motion abnormalities. There is moderate asymmetric left ventricular hypertrophy of the basal-septal  segment. Left ventricular diastolic parameters are consistent with Grade I diastolic dysfunction (impaired relaxation).  2. Right ventricular systolic function is normal. The right ventricular size is  normal. There is mildly elevated pulmonary artery systolic pressure.  3. Left atrial size was severely dilated.  4. There is at most mild mitral stenosis. Mitral gradients are high due to tachycardia and severe mitral insufficiency. There is moderate to severe systolic anterior motion of the mitral valve with secondary mitral insufficiency and LV outflow obstruction. The mitral valve is degenerative. Severe mitral valve regurgitation. The mean mitral valve gradient is 8.5 mmHg with average heart rate of 105 bpm. Severe mitral annular calcification.  5. Aortic valve gradients and calculated aortic valve area are inaccurate due to subvalvular dynamic obstruction due to HOCM pathophysiology. Dimensionless valve index is consistent with normal prosthetic valve function.. The aortic valve has been repaired/replaced. Aortic valve regurgitation is not visualized. There is a 26 mm Sapien prosthetic (TAVR) valve present in the aortic position. Echo findings are consistent with normal structure and function of the aortic valve prosthesis. Aortic valve Vmax measures 3.30 m/s.  6. The inferior vena cava is normal in size with greater than 50% respiratory variability, suggesting right atrial pressure of 3 mmHg. Comparison(s): The degree of SAM of the mitral valve is worse, with increased severity of MR and increased subvalvular gradient (likely due to post-TAVR afterload reduction and tachycardia). FINDINGS  Left Ventricle: Left ventricular ejection fraction, by estimation, is 70 to 75%. The left ventricle has hyperdynamic function. The left ventricle has no regional wall motion abnormalities. The left ventricular internal cavity size was normal in size. There is moderate asymmetric left ventricular hypertrophy of the basal-septal segment. Left ventricular diastolic parameters are consistent with Grade I diastolic dysfunction (impaired relaxation). Indeterminate filling pressures. Right Ventricle: The right ventricular size is  normal. No increase in right ventricular wall thickness.  Right ventricular systolic function is normal. There is mildly elevated pulmonary artery systolic pressure. The tricuspid regurgitant velocity is 2.85  m/s, and with an assumed right atrial pressure of 3 mmHg, the estimated right ventricular systolic pressure is 40.8 mmHg. Left Atrium: Left atrial size was severely dilated. Right Atrium: Right atrial size was normal in size. Pericardium: There is no evidence of pericardial effusion. Mitral Valve: There is at most mild mitral stenosis. Mitral gradients are high due to tachycardia and severe mitral insufficiency. There is moderate to severe systolic anterior motion of the mitral valve with secondary mitral insufficiency and LV outflow  obstruction. The mitral valve is degenerative in appearance. Severe mitral annular calcification. Severe mitral valve regurgitation. MV peak gradient, 15.2 mmHg. The mean mitral valve gradient is 8.5 mmHg with average heart rate of 105 bpm. Tricuspid Valve: The tricuspid valve is normal in structure. Tricuspid valve regurgitation is mild. Aortic Valve: Aortic valve gradients and calculated aortic valve area are inaccurate due to subvalvular dynamic obstruction due to HOCM pathophysiology. Dimensionless valve index is consistent with normal prosthetic valve function. The aortic valve has been repaired/replaced. Aortic valve regurgitation is not visualized. Aortic valve mean gradient measures 21.5 mmHg. Aortic valve peak gradient measures 43.4 mmHg. Aortic valve area, by VTI measures 1.68 cm. There is a 26 mm Sapien prosthetic, stented (TAVR) valve present in the aortic position. Echo findings are consistent with normal structure and function of the aortic valve prosthesis. Pulmonic Valve: The pulmonic valve was grossly normal. Pulmonic valve regurgitation is not visualized. Aorta: The aortic root is normal in size and structure. Venous: A pattern of systolic flow reversal,  suggestive of severe mitral regurgitation is recorded from the right upper pulmonary vein and the left upper pulmonary vein. The inferior vena cava is normal in size with greater than 50% respiratory variability, suggesting right atrial pressure of 3 mmHg. IAS/Shunts: No atrial level shunt detected by color flow Doppler.  LEFT VENTRICLE PLAX 2D LVIDd:         3.50 cm LVIDs:         2.60 cm LV PW:         1.20 cm LV IVS:        1.70 cm LVOT diam:     2.00 cm LV SV:         92 LV SV Index:   59 LVOT Area:     3.14 cm  RIGHT VENTRICLE RV S prime:     17.40 cm/s TAPSE (M-mode): 2.0 cm LEFT ATRIUM             Index        RIGHT ATRIUM          Index LA diam:        3.90 cm 2.49 cm/m   RA Area:     7.50 cm LA Vol (A2C):   61.5 ml 39.21 ml/m  RA Volume:   11.50 ml 7.33 ml/m LA Vol (A4C):   80.6 ml 51.38 ml/m LA Biplane Vol: 74.6 ml 47.56 ml/m  AORTIC VALVE                     PULMONIC VALVE AV Area (Vmax):    1.57 cm      PV Vmax:       2.88 m/s AV Area (Vmean):   1.61 cm      PV Peak grad:  33.2 mmHg AV Area (VTI):     1.68 cm AV Vmax:  329.50 cm/s AV Vmean:          210.250 cm/s AV VTI:            0.549 m AV Peak Grad:      43.4 mmHg AV Mean Grad:      21.5 mmHg LVOT Vmax:         165.00 cm/s LVOT Vmean:        108.000 cm/s LVOT VTI:          0.294 m LVOT/AV VTI ratio: 0.54 MITRAL VALVE                  TRICUSPID VALVE MV Area (PHT): 3.34 cm       TR Peak grad:   32.5 mmHg MV Area VTI:   2.84 cm       TR Vmax:        285.00 cm/s MV Peak grad:  15.2 mmHg MV Mean grad:  8.5 mmHg       SHUNTS MV Vmax:       1.95 m/s       Systemic VTI:  0.29 m MV Vmean:      137.5 cm/s     Systemic Diam: 2.00 cm MV Decel Time: 227 msec MR Peak grad:    171.6 mmHg MR Mean grad:    132.0 mmHg MR Vmax:         655.00 cm/s MR Vmean:        565.0 cm/s MR PISA:         5.09 cm MR PISA Eff ROA: 36 mm MR PISA Radius:  0.90 cm MV E velocity: 142.00 cm/s Dani Gobble Croitoru MD Electronically signed by Sanda Klein MD Signature  Date/Time: 09/09/2021/12:26:35 PM    Final    ECHOCARDIOGRAM LIMITED  Result Date: 09/08/2021    ECHOCARDIOGRAM LIMITED REPORT   Patient Name:   [facility] SPRINKLE Kenan Date of Exam: 09/08/2021 Medical Rec #:  [id number]               Height:       61.0 in Accession #:    [id number]              Weight:       120.0 lb Date of Birth:  01-Feb-1946                BSA:          1.520 m Patient Age:    12 years                BP:           172/57 mmHg Patient Gender: F                       HR:           62 bpm. Exam Location:  Inpatient Procedure: Limited Echo, Cardiac Doppler and Color Doppler Indications:    Aortic stenosis I35.0  History:        Patient has prior history of Echocardiogram examinations, most                 recent 06/22/2021. CAD, Aortic Valve Disease and Mitral Valve                 Disease, Arrythmias:Atrial Flutter, Signs/Symptoms:Murmur; Risk                 Factors:Former Smoker. HOCM.  Aortic Valve: 26 mm stented (TAVR) valve is present in the                 aortic position. Procedure Date: 09/08/2021.  Sonographer:    Darlina Sicilian RDCS Referring Phys: [place]                  Normal left ventricular regional wall motion and overall                 systolic function. Estimated LVEF 65%.                 Trileaflet aortic valve with severe calcific stenosis.                 Moderate aortic insufficiency.                 Peak aortic valve gradient 70 mm Hg, mean gradient 47 mm Hg,                 dimensionless valve index 0.27, calculated valve area 0.75 cm sq                 (0.5 cm sq/m sq indexed for BSA).                 Moderate-to-severe centrally-directed mitral insufficiency.                 No pericardial effusion.                  PREOPERATIVE FINDINGS                  Hyperdynamic left ventricular systolic function. Estimated LVEF                 65-70%.                 There is systolic anterior motion of the mitral  valve, but with                 very mild LVOT obstruction.                 Well-seated Medtronic stent-valve (TAVR). The valve was                 reexpanded due to moderate perivalvular leak. There appears to                 be only a trivial leak at the end of the procedure.                 Peak aortic valve gradient 13 mm Hg, mean gradient 7 mm Hg.                 Unchanged moderate to severe mitral insufficiency.                 No pericardial effusion. IMPRESSIONS  1. Left ventricular ejection fraction, by estimation, is 60 to 65%. The left ventricle has normal function. The left ventricle has no regional wall motion abnormalities. There is moderate concentric left ventricular hypertrophy.  2. Right ventricular systolic function is normal. The right ventricular size is normal.  3. The mitral valve is degenerative. Moderate to severe mitral valve regurgitation. Severe mitral annular calcification.  4. The aortic valve has been repaired/replaced. There is a 26 mm stented (  TAVR) valve present in the aortic position. Procedure Date: 09/08/2021. FINDINGS  Left Ventricle: Left ventricular ejection fraction, by estimation, is 60 to 65%. The left ventricle has normal function. The left ventricle has no regional wall motion abnormalities. The left ventricular internal cavity size was normal in size. There is  moderate concentric left ventricular hypertrophy. Right Ventricle: The right ventricular size is normal. No increase in right ventricular wall thickness. Right ventricular systolic function is normal. Pericardium: There is no evidence of pericardial effusion. Mitral Valve: The mitral valve is degenerative in appearance. Severe mitral annular calcification. Moderate to severe mitral valve regurgitation. Aortic Valve: The aortic valve has been repaired/replaced. Aortic regurgitation PHT measures 241 msec. Aortic valve mean gradient measures 7.0 mmHg. Aortic valve peak gradient measures 13.1 mmHg. Aortic valve area,  by VTI measures 3.16 cm. There is a 26  mm stented (TAVR) valve present in the aortic position. Procedure Date: 09/08/2021. LEFT VENTRICLE PLAX 2D LVOT diam:     1.86 cm LV SV:         147 LV SV Index:   97 LVOT Area:     2.72 cm  AORTIC VALVE AV Area (Vmax):    2.96 cm AV Area (Vmean):   2.96 cm AV Area (VTI):     3.16 cm AV Vmax:           181.00 cm/s AV Vmean:          124.000 cm/s AV VTI:            0.465 m AV Peak Grad:      13.1 mmHg AV Mean Grad:      7.0 mmHg LVOT Vmax:         197.00 cm/s LVOT Vmean:        135.000 cm/s LVOT VTI:          0.540 m LVOT/AV VTI ratio: 1.16 AI PHT:            241 msec MR Peak grad:    215.5 mmHg MR Mean grad:    129.0 mmHg   SHUNTS MR Vmax:         734.00 cm/s  Systemic VTI:  0.54 m MR Vmean:        534.0 cm/s   Systemic Diam: 1.86 cm MR PISA:         1.57 cm MR PISA Eff ROA: 5 mm MR PISA Radius:  0.50 cm Dani Gobble Croitoru MD Electronically signed by Sanda Klein MD Signature Date/Time: 09/08/2021/5:31:38 PM    Final    Structural Heart Procedure  Result Date: 09/08/2021 See surgical note for result.   Assessment & Plan:   Diagnoses and all orders for this visit:  Flu vaccine need -     Flu Vaccine QUAD High Dose(Fluad)   I am having Truitt Leep. [place] maintain her Vitamin D3, denosumab, cyanocobalamin, rosuvastatin, metoprolol succinate, Polyethyl Glycol-Propyl Glycol (SYSTANE OP), and Eliquis.  No orders of the defined types were placed in this encounter.    Follow-up: No follow-ups on file.  Scarlette Calico, MD

## 2022-03-09 ENCOUNTER — Telehealth: Payer: Self-pay | Admitting: Internal Medicine

## 2022-03-09 NOTE — Telephone Encounter (Signed)
Pt has bee informed that she is eligible for her second shingles vaccine now. She is with in her 2-60mowindow for receiving the 2nd vaccine. She expressed understanding.

## 2022-03-09 NOTE — Telephone Encounter (Signed)
Patient called back to let Dr Ronnald Ramp know that she had her first shingle shot on 11/18/21 at [facility] in [place]. She asked for a call back at [phone] to let her know when she can get her next one

## 2022-03-22 ENCOUNTER — Ambulatory Visit: Payer: Medicare HMO | Admitting: Gastroenterology

## 2022-03-22 ENCOUNTER — Telehealth: Payer: Self-pay

## 2022-03-22 ENCOUNTER — Encounter: Payer: Self-pay | Admitting: Gastroenterology

## 2022-03-22 VITALS — BP 128/70 | HR 78 | Ht 61.0 in | Wt 121.0 lb

## 2022-03-22 DIAGNOSIS — Z7901 Long term (current) use of anticoagulants: Secondary | ICD-10-CM

## 2022-03-22 DIAGNOSIS — S0990XA Unspecified injury of head, initial encounter: Secondary | ICD-10-CM | POA: Diagnosis not present

## 2022-03-22 DIAGNOSIS — I480 Paroxysmal atrial fibrillation: Secondary | ICD-10-CM | POA: Diagnosis not present

## 2022-03-22 DIAGNOSIS — S199XXA Unspecified injury of neck, initial encounter: Secondary | ICD-10-CM | POA: Diagnosis not present

## 2022-03-22 DIAGNOSIS — Z860100 Personal history of colon polyps, unspecified: Secondary | ICD-10-CM

## 2022-03-22 DIAGNOSIS — I959 Hypotension, unspecified: Secondary | ICD-10-CM | POA: Diagnosis not present

## 2022-03-22 DIAGNOSIS — Z888 Allergy status to other drugs, medicaments and biological substances status: Secondary | ICD-10-CM | POA: Diagnosis not present

## 2022-03-22 DIAGNOSIS — Z885 Allergy status to narcotic agent status: Secondary | ICD-10-CM | POA: Diagnosis not present

## 2022-03-22 DIAGNOSIS — M549 Dorsalgia, unspecified: Secondary | ICD-10-CM | POA: Diagnosis not present

## 2022-03-22 DIAGNOSIS — S61211A Laceration without foreign body of left index finger without damage to nail, initial encounter: Secondary | ICD-10-CM | POA: Diagnosis not present

## 2022-03-22 DIAGNOSIS — M545 Low back pain, unspecified: Secondary | ICD-10-CM | POA: Diagnosis not present

## 2022-03-22 DIAGNOSIS — I10 Essential (primary) hypertension: Secondary | ICD-10-CM | POA: Diagnosis not present

## 2022-03-22 DIAGNOSIS — S61213A Laceration without foreign body of left middle finger without damage to nail, initial encounter: Secondary | ICD-10-CM | POA: Diagnosis not present

## 2022-03-22 DIAGNOSIS — Z952 Presence of prosthetic heart valve: Secondary | ICD-10-CM

## 2022-03-22 DIAGNOSIS — R58 Hemorrhage, not elsewhere classified: Secondary | ICD-10-CM | POA: Diagnosis not present

## 2022-03-22 DIAGNOSIS — Z91040 Latex allergy status: Secondary | ICD-10-CM | POA: Diagnosis not present

## 2022-03-22 DIAGNOSIS — Z23 Encounter for immunization: Secondary | ICD-10-CM | POA: Diagnosis not present

## 2022-03-22 DIAGNOSIS — Z79899 Other long term (current) drug therapy: Secondary | ICD-10-CM | POA: Diagnosis not present

## 2022-03-22 DIAGNOSIS — Z743 Need for continuous supervision: Secondary | ICD-10-CM | POA: Diagnosis not present

## 2022-03-22 MED ORDER — ONDANSETRON HCL 4 MG PO TABS: 4.0000 mg | ORAL_TABLET | Freq: Three times a day (TID) | ORAL | 0 refills | Status: DC | PRN

## 2022-03-22 MED ORDER — NA SULFATE-K SULFATE-MG SULF 17.5-3.13-1.6 GM/177ML PO SOLN: 1.0000 | Freq: Once | ORAL | 0 refills | Status: AC

## 2022-03-22 NOTE — Progress Notes (Signed)
Carrie [name]    [id number]    12-20-1945  Primary Care Physician:Jones, Arvid Right, MD  Referring Physician: [name], MD [address] [place],  [place] [address]   Chief complaint: Discuss surveillance colonoscopy  HPI:  76 year old very pleasant female with history of aortic stenosis s/p TAVR, paroxysmal A-fib on chronic anticoagulation with Eliquis here to discuss surveillance colonoscopy.  Last office visit March 16, 2019. Overall she is doing well from GI standpoint. Denies any nausea, vomiting, abdominal pain, change in bowel habits, unintentional weight loss, melena or bright red blood per rectum   She [place], is very active.  Denies any chest pain or shortness of breath  She had multiple adenomatous colon polyps removed in 2018  Colonoscopy April 15, 2017 - One 2 mm polyp in the ascending colon, removed with a cold biopsy forceps. Resected and retrieved. - One 11 mm polyp at the ileocecal valve, removed with a cold snare. Resected and retrieved. - Five 5 to 9 mm polyps in the descending colon, in the transverse colon and in the ascending colon, removed with a cold snare. Resected and retrieved. - Moderate diverticulosis in the sigmoid colon, in the descending colon, in the transverse colon and in the ascending colon. - Non-bleeding internal hemorrhoids.  Outpatient Encounter Medications as of 03/22/2022  Medication Sig   Cholecalciferol (VITAMIN D3) 1000 UNITS CAPS Take 1 capsule by mouth daily.   denosumab (PROLIA) 60 MG/ML SOSY injection Inject 60 mg into the skin every 6 (six) months.   ELIQUIS 5 MG TABS tablet Take 1 tablet (5 mg total) by mouth 2 (two) times daily.   metoprolol succinate (TOPROL-XL) 50 MG 24 hr tablet Take 1 tablet (50 mg total) by mouth daily. Take with or immediately following a meal.   Polyethyl Glycol-Propyl Glycol (SYSTANE OP) Apply 1 drop to eye 2 (two) times daily  as needed (dry eyes).   potassium chloride (KLOR-CON M) 10 MEQ tablet Take 1 tablet (10 mEq total) by mouth 2 (two) times daily.   rosuvastatin (CRESTOR) 20 MG tablet Take 1 tablet (20 mg total) by mouth daily.   vitamin B-12 (CYANOCOBALAMIN) 1000 MCG tablet Take 1,000 mcg by mouth daily.   No facility-administered encounter medications on file as of 03/22/2022.    Allergies as of 03/22/2022 - Review Complete 03/22/2022  Allergen Reaction Noted   Codeine Itching and Nausea And Vomiting 03/11/2010   Crestor [rosuvastatin] Other (See Comments) 06/23/2012   Latex Rash    Tape Rash 09/04/2021    Past Medical History:  Diagnosis Date   Coronary artery disease    a. Prior LHC (07/24/03): LMCA heavily calcified without significant stenosis, LAD with heavy calcification proximally. 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 CBurt Knack& Dr. BCyndia Bent  Severe aortic stenosis    Wears hearing aid  left    Past Surgical History:  Procedure Laterality Date   ATRIAL ABLATION SURGERY  04/27/2003   BREAST SURGERY  04/26/1980   Benign tumor removed RT   BUNIONECTOMY  04/07/2012   Procedure: BUNIONECTOMY;  Surgeon: Alta Corning, MD;  Location: [facility];  Service: Orthopedics;  Laterality: Left;  CHEVRON OSTEOTOMY LEFT FOOT    CARDIAC CATHETERIZATION  04/27/2003   EYE SURGERY     bilateral cataracts   GANGLION CYST EXCISION      left wrist   HERNIA REPAIR  04/26/1988   rt ing   INTRAOPERATIVE TRANSTHORACIC ECHOCARDIOGRAM N/A 09/08/2021   Procedure: INTRAOPERATIVE TRANSTHORACIC ECHOCARDIOGRAM;  Surgeon: [name], MD;  Location: [facility];  Service: Open Heart Surgery;  Laterality: N/A;   RIGHT/LEFT HEART CATH AND CORONARY ANGIOGRAPHY N/A 04/22/2021   Procedure: RIGHT/LEFT HEART CATH AND CORONARY ANGIOGRAPHY;  Surgeon: [name], MD;  Location: [facility];  Service: Cardiovascular;  Laterality: N/A;   TONSILLECTOMY     TRANSCATHETER AORTIC VALVE REPLACEMENT, TRANSFEMORAL N/A 09/08/2021   Procedure: Transcatheter Aortic Valve Replacement, Transfemoral;  Surgeon: [name], MD;  Location: [facility];  Service: Open Heart Surgery;  Laterality: N/A;   TUBAL LIGATION      Family History  Problem Relation Age of Onset   Heart disease Mother 43   Congestive Heart Failure Mother    Dementia Mother    Hypertension Other    Valvular heart disease Father        Had valves replaced   Colon cancer Neg Hx    Rectal cancer Neg Hx     Social History   Socioeconomic History   Marital status: Divorced    Spouse name: Not on file   Number of children: 2   Years of education: Not on file   Highest education level: Not on file  Occupational History   Occupation: retired  Tobacco Use   Smoking status: Former    Types: Cigarettes    Quit date: 04/05/1999    Years since quitting: 22.9   Smokeless tobacco: Never   Tobacco comments:    Regular Exercise-No  Vaping Use   Vaping Use: Never used  Substance and Sexual Activity   Alcohol use: Yes    Alcohol/week: 7.0 standard drinks of alcohol    Types: 7 Glasses of wine per week    Comment: 1 drink per week   Drug use: No   Sexual activity: Not Currently    Birth control/protection: Post-menopausal  Other Topics Concern   Not on file  Social History Narrative   Not on file   Social Determinants of Health   Financial  Resource Strain: Low Risk  (11/09/2021)   Overall Financial Resource Strain (CARDIA)    Difficulty of Paying Living Expenses: Not hard at all  Food Insecurity: No Food Insecurity (11/09/2021)   Hunger Vital Sign    Worried About Running Out of Food in the Last Year: Never true    Ran Out of Food in the Last Year: Never true  Transportation Needs: No Transportation Needs (11/09/2021)   PRAPARE - Hydrologist (Medical): No    Lack of Transportation (Non-Medical): No  Physical Activity: Sufficiently Active (11/09/2021)   Exercise Vital Sign    Days of Exercise per Week: 5 days    Minutes of Exercise per Session: 30 min  Recent Concern: Physical Activity - Inactive (11/09/2021)   Exercise Vital Sign  Days of Exercise per Week: 5 days    Minutes of Exercise per Session: 0 min  Stress: No Stress Concern Present (11/09/2021)   [place]    Feeling of Stress : Not at all  Social Connections: Moderately Integrated (11/09/2021)   Social Connection and Isolation Panel [NHANES]    Frequency of Communication with Friends and Family: More than three times a week    Frequency of Social Gatherings with Friends and Family: Once a week    Attends Religious Services: More than 4 times per year    Active Member of [employer] or Organizations: Yes    Attends Music therapist: More than 4 times per year    Marital Status: Divorced  Intimate Partner Violence: Not At Risk (11/09/2021)   Humiliation, Afraid, Rape, and Kick questionnaire    Fear of Current or Ex-Partner: No    Emotionally Abused: No    Physically Abused: No    Sexually Abused: No      Review of systems: All other review of systems negative except as mentioned in the HPI.   Physical Exam: Vitals:   03/22/22 1032  BP: 128/70  Pulse: 78   Body mass index is 22.86 kg/m. Gen:      No acute distress HEENT:  sclera anicteric Abd:       soft, non-tender; no palpable masses, no distension Ext:    No edema Neuro: alert and oriented x 3 Psych: normal mood and affect  Data Reviewed:  Reviewed labs, radiology imaging, old records and pertinent past GI work up   Assessment and Plan/Recommendations:  76 year old very pleasant female with multiple adenomatous colon polyps, is past due for surveillance colonoscopy We will schedule it The risks and benefits as well as alternatives of endoscopic procedure(s) have been discussed and reviewed. All questions answered. The patient agrees to proceed.  She has history of aortic stenosis, is s/p TAVR 2023  History of paroxysmal A-fib on Eliquis, will request cardiology clearance to hold Eliquis for 2 days prior to the procedure  Return after colonoscopy as needed  The patient was provided an opportunity to ask questions and all were answered. The patient agreed with the plan and demonstrated an understanding of the instructions.  Damaris Hippo , MD    CC: [name], MD

## 2022-03-22 NOTE — Patient Instructions (Signed)
We have sent the following medications to your pharmacy for you to pick up at your convenience: Suprep and Zofran to take as needed prior to prep dose.  You have been scheduled for a colonoscopy. Please follow written instructions given to you at your visit today.  Please pick up your prep supplies at the pharmacy within the next 1-3 days. If you use inhalers (even only as needed), please bring them with you on the day of your procedure.  [facility] providers would like to encourage you to use [facility] to communicate with providers for non-urgent requests or questions.  Due to long hold times on the telephone, sending your provider a message by [facility] may be a faster and more efficient way to get a response.  Please allow 48 business hours for a response.  Please remember that this is for non-urgent requests.   Due to recent changes in healthcare laws, you may see the results of your imaging and laboratory studies on MyChart before your provider has had a chance to review them.  We understand that in some cases there may be results that are confusing or concerning to you. Not all laboratory results come back in the same time frame and the provider may be waiting for multiple results in order to interpret others.  Please give [place] 48 hours in order for your provider to thoroughly review all the results before contacting the office for clarification of your results.

## 2022-03-22 NOTE — Telephone Encounter (Signed)
[facility] Pre-operative Risk Assessment     Request for surgical clearance:     Endoscopy Procedure  What type of surgery is being performed?     colonoscopy  When is this surgery scheduled?     05/10/22  What type of clearance is required ?   Pharmacy  Are there any medications that need to be held prior to surgery and how long? Eliquis x 2 days  Practice name and name of physician performing surgery?      [facility]  What is your office phone and fax number?      Phone- [phone]  [phone]  Anesthesia type (None, local, MAC, general) ?       MAC

## 2022-03-23 NOTE — Telephone Encounter (Signed)
Informed patient that she can hold Eliquis 2 days prior to her procedure. Patient verbalized understanding.

## 2022-03-23 NOTE — Telephone Encounter (Signed)
Patient with diagnosis of afib on Eliquis for anticoagulation.    Procedure: colonoscopy Date of procedure: 05/10/22  CHA2DS2-VASc Score = 5  This indicates a 7.2% annual risk of stroke. The patient's score is based upon: CHF History: 0 HTN History: 1 Diabetes History: 0 Stroke History: 0 Vascular Disease History: 1 Age Score: 2 Gender Score: 1  CrCl 70m/min Platelet count 265K  Per office protocol, patient can hold Eliquis for 2 days prior to procedure as requested.    **This guidance is not considered finalized until pre-operative APP has relayed final recommendations.**

## 2022-03-23 NOTE — Telephone Encounter (Signed)
   Name: Terah Robey  DOB: 05/23/1945  MRN: [id number]   Primary Cardiologist: Freada Bergeron, MD  Chart reviewed as part of pre-operative protocol coverage.   We have been asked for guidance for Eliquis hold. Per PharmD: Procedure: colonoscopy Date of procedure: 05/10/22   CHA2DS2-VASc Score = 5  This indicates a 7.2% annual risk of stroke. The patient's score is based upon: CHF History: 0 HTN History: 1 Diabetes History: 0 Stroke History: 0 Vascular Disease History: 1 Age Score: 2 Gender Score: 1   CrCl 67m/min Platelet count 265K   Per office protocol, patient can hold Eliquis for 2 days prior to procedure as requested.  I will route this recommendation to the requesting party via Epic fax function and remove from pre-op pool. Please call with questions.  DMayra Reel NP 03/23/2022, 11:16 AM

## 2022-03-25 ENCOUNTER — Other Ambulatory Visit: Payer: Self-pay | Admitting: Cardiovascular Disease

## 2022-03-25 DIAGNOSIS — I480 Paroxysmal atrial fibrillation: Secondary | ICD-10-CM

## 2022-03-25 DIAGNOSIS — I4892 Unspecified atrial flutter: Secondary | ICD-10-CM

## 2022-03-25 NOTE — Telephone Encounter (Signed)
Eliquis '5mg'$  refill request received. Patient is 76 years old, weight-54.9kg, Crea-0.63 on 12/21/21, Diagnosis-Afib, and last seen by Kathyrn Drown on 10/19/21. Dose is appropriate based on dosing criteria. Will send in refill to requested pharmacy.

## 2022-03-28 NOTE — Progress Notes (Unsigned)
    Subjective:    Patient ID: Carrie [name], female    DOB: 05-07-1945, 76 y.o.   MRN: [id number]      HPI Zaiya is here for No chief complaint on file.    [facility] 11[facility] - went to ED on 11/27 - denied pain in ED.  On eliquis. She had small sin tears to left middle and index fingers.  Ct head and c-spine were done and w/o acute findings. Skins tears dressed with bacitracin.  Tdap given.     Medications and allergies reviewed with patient and updated if appropriate.  Current Outpatient Medications on File Prior to Visit  Medication Sig Dispense Refill   Cholecalciferol (VITAMIN D3) 1000 UNITS CAPS Take 1 capsule by mouth daily.     denosumab (PROLIA) 60 MG/ML SOSY injection Inject 60 mg into the skin every 6 (six) months.     ELIQUIS 5 MG TABS tablet TAKE 1 TABLET BY MOUTH TWICE A DAY 180 tablet 1   metoprolol succinate (TOPROL-XL) 50 MG 24 hr tablet Take 1 tablet (50 mg total) by mouth daily. Take with or immediately following a meal. 90 tablet 3   ondansetron (ZOFRAN) 4 MG tablet Take 1 tablet (4 mg total) by mouth every 8 (eight) hours as needed for nausea or vomiting. 4 tablet 0   Polyethyl Glycol-Propyl Glycol (SYSTANE OP) Apply 1 drop to eye 2 (two) times daily as needed (dry eyes).     potassium chloride (KLOR-CON M) 10 MEQ tablet Take 1 tablet (10 mEq total) by mouth 2 (two) times daily. 180 tablet 0   rosuvastatin (CRESTOR) 20 MG tablet Take 1 tablet (20 mg total) by mouth daily. 90 tablet 3   vitamin B-12 (CYANOCOBALAMIN) 1000 MCG tablet Take 1,000 mcg by mouth daily.     No current facility-administered medications on file prior to visit.    Review of Systems     Objective:  There were no vitals filed for this visit. BP Readings from Last 3 Encounters:  03/22/22 128/70  03/08/22 134/78  12/21/21 132/74   Wt Readings from Last 3 Encounters:  03/22/22 121 lb (54.9 kg)  03/08/22 121 lb (54.9 kg)  12/21/21 122 lb (55.3 kg)   There is no height or weight on  file to calculate BMI.    Physical Exam         Assessment & Plan:    See Problem List for Assessment and Plan of chronic medical problems.

## 2022-03-29 ENCOUNTER — Encounter: Payer: Self-pay | Admitting: Internal Medicine

## 2022-03-29 ENCOUNTER — Ambulatory Visit (INDEPENDENT_AMBULATORY_CARE_PROVIDER_SITE_OTHER): Payer: Medicare HMO | Admitting: Internal Medicine

## 2022-03-29 VITALS — BP 124/78 | HR 70 | Temp 97.6°F | Ht 61.0 in | Wt 122.4 lb

## 2022-03-29 DIAGNOSIS — S61412A Laceration without foreign body of left hand, initial encounter: Secondary | ICD-10-CM

## 2022-03-29 DIAGNOSIS — I10 Essential (primary) hypertension: Secondary | ICD-10-CM | POA: Diagnosis not present

## 2022-03-29 NOTE — Patient Instructions (Addendum)
    You can continue to drive.      Return if symptoms worsen or fail to improve.

## 2022-04-01 DIAGNOSIS — Z961 Presence of intraocular lens: Secondary | ICD-10-CM | POA: Diagnosis not present

## 2022-04-02 NOTE — Telephone Encounter (Signed)
Pt is calling to get an update for her pt assistance

## 2022-04-08 NOTE — Telephone Encounter (Deleted)
Forwarding to RX Prior Auth Team 

## 2022-04-08 NOTE — Telephone Encounter (Signed)
Forwarding to RX Prior Auth Team 

## 2022-04-09 NOTE — Telephone Encounter (Signed)
Patient's application was received by Roosvelt Harps squibb for eliquis application # [id number]

## 2022-04-21 ENCOUNTER — Ambulatory Visit: Payer: Medicare HMO | Admitting: Cardiology

## 2022-04-25 ENCOUNTER — Other Ambulatory Visit: Payer: Self-pay | Admitting: Cardiovascular Disease

## 2022-04-28 ENCOUNTER — Other Ambulatory Visit: Payer: Self-pay | Admitting: Cardiovascular Disease

## 2022-04-28 DIAGNOSIS — I2510 Atherosclerotic heart disease of native coronary artery without angina pectoris: Secondary | ICD-10-CM

## 2022-04-28 NOTE — Telephone Encounter (Signed)
Prolia VOB initiated via [email or web address] 

## 2022-05-03 ENCOUNTER — Encounter: Payer: Self-pay | Admitting: Gastroenterology

## 2022-05-03 ENCOUNTER — Telehealth: Payer: Self-pay | Admitting: Cardiology

## 2022-05-03 NOTE — Telephone Encounter (Signed)
Patient called to check if she has orders for an Echo test to be done on Thursday, 1/11 prior to her office visit at 10:20 am.

## 2022-05-03 NOTE — Telephone Encounter (Signed)
Called the pt.    Pt aware that she will not need her one year s/p TAVR echo until 10/18/22.  Pt aware she will see [place] on this Thursday 1/11 with Dr. Johney Frame at [phone].  She is aware that her one year echo is scheduled for 10/18/22 at 0920 and she will see Structural APP thereafter at 1020.   Pt verbalized understanding and agrees with this plan.

## 2022-05-03 NOTE — Progress Notes (Deleted)
Cardiology Office Note:    Date:  05/03/2022   ID:  Carrie [name], Carrie [name] 1945-05-09, MRN [id number]  PCP:  Carrie Lima, MD   [facility] Cardiologist:  Carrie Bergeron, MD  Referring MD: Carrie Lima, MD    History of Present Illness:    Carrie [name] is a 77 y.o. female with a hx of moderate nonobstructive CAD, remote SVT ablation, HOCM with LVOT obstruction, paroxysmal atrial fibrillation on Eliquis, severe MAC, mod-severe MR with mild MS and severe AS s/p TAVR who presents to clinic for follow-up.  Per review of the record, patient has history of SVT ablation in 2005. Had progressive aortic stenosis with worsening fatigue. TTE Cath in 03/2021 with calcified but nonobstructive CAD. TTE 05/2021 with LVEF 60%, severe basal septal hypertrophy with SAM and LVOT gradient of 3.5 m/s with valsalva c/w HOCM. Aov mean grad 49.3 mmHg, peak grad 84.4 mmHg, AVA 0.57 cm2, DVI 0.23 moderate to severe MR with mild MS and severe MAC. Mild AR. BSA: 1.6 m2.  She ultimately underwent TAVR on 09/08/21 with a Medtronic Evolut FX 56m via the TF approach. There was significant leak after valve deployment therefore valve was post dilated with improvement. Pre-op cath with nonobstructive CAD. Post-op TTE with mean gradient  21.5 mmHg, peak gradient measures 43.456mg and AVA by VTI measures 1.68 cm. She was restarted on Eliquis with no issues.    Was last seen by Carrie [name] 09/2021 where she was doing very well. TTE with LVEF 55-60%, SAM with LVOT peak gradient 4748m with valsalva, moderate MR, mild MS, severe MAC, mean AoV gradient 69m47mwith mild perivalvular AR  Today, ***  Past Medical History:  Diagnosis Date   Coronary artery disease    a. Prior LHC (07/24/03): LMCA heavily calcified without significant stenosis, LAD with heavy calcification proximally. 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 CoopeBurt [name]. BartlCyndia Bentvere aortic stenosis    Wears hearing aid    left    Past Surgical History:  Procedure Laterality Date   ATRIAL ABLATION SURGERY  04/27/2003   BREAST SURGERY  04/26/1980   Benign tumor removed RT   BUNIONECTOMY  04/07/2012   Procedure: BUNIONECTOMY;  Surgeon: Carrie [name]  [facility];  Laterality: Left;  CHEVRON OSTEOTOMY LEFT FOOT    CARDIAC CATHETERIZATION  04/27/2003   EYE SURGERY     bilateral cataracts   GANGLION CYST EXCISION     left wrist   HERNIA REPAIR  04/26/1988   rt ing   INTRAOPERATIVE TRANSTHORACIC ECHOCARDIOGRAM N/A 09/08/2021   Procedure: INTRAOPERATIVE TRANSTHORACIC ECHOCARDIOGRAM;  Surgeon: Carrie [name]  [facility];  Service: Open Heart Surgery;  Laterality: N/A;   RIGHT/LEFT HEART CATH AND CORONARY ANGIOGRAPHY N/A 04/22/2021   Procedure: RIGHT/LEFT HEART CATH AND CORONARY ANGIOGRAPHY;  Surgeon: Carrie [name]  Location:  [facility];  Service: Cardiovascular;  Laterality: N/A;   TONSILLECTOMY     TRANSCATHETER AORTIC VALVE REPLACEMENT, TRANSFEMORAL N/A 09/08/2021   Procedure: Transcatheter Aortic Valve Replacement, Transfemoral;  Surgeon: [name], MD;  Location: [facility];  Service: Open Heart Surgery;  Laterality: N/A;   TUBAL LIGATION      Current Medications: No outpatient medications have been marked as taking for the 05/06/22 encounter  (Appointment) with Carrie Bergeron, MD.     Allergies:   Codeine, Crestor [rosuvastatin], Latex, and Tape   Social History   Socioeconomic History   Marital status: Divorced    Spouse name: Not on file   Number of children: 2   Years of education: Not on file   Highest education level: Not on file  Occupational History   Occupation: retired  Tobacco Use   Smoking status: Former    Types: Cigarettes    Quit date: 04/05/1999    Years since quitting: 23.0   Smokeless tobacco: Never   Tobacco comments:    Regular Exercise-No  Vaping Use   Vaping Use: Never used  Substance and Sexual Activity   Alcohol use: Yes    Alcohol/week: 7.0 standard drinks of alcohol    Types: 7 Glasses of wine per week    Comment: 1 drink per week   Drug use: No   Sexual activity: Not Currently    Birth control/protection: Post-menopausal  Other Topics Concern   Not on file  Social History Narrative   Not on file   Social Determinants of Health   Financial Resource Strain: Low Risk  (11/09/2021)   Overall Financial Resource Strain (CARDIA)    Difficulty of Paying Living Expenses: Not hard at all  Food Insecurity: No Food Insecurity (11/09/2021)   Hunger Vital Sign    Worried About Running Out of Food in the Last Year: Never true    Ran Out of Food in the Last Year: Never true  Transportation Needs: No Transportation Needs (11/09/2021)   PRAPARE - Hydrologist (Medical): No    Lack of Transportation (Non-Medical): No  Physical Activity: Sufficiently Active (11/09/2021)   Exercise Vital Sign    Days of Exercise per Week: 5 days    Minutes of Exercise per Session: 30 min  Recent Concern: Physical Activity - Inactive (11/09/2021)   Exercise Vital Sign    Days of Exercise per Week: 5 days    Minutes of Exercise per Session: 0 min  Stress: No Stress Concern Present (11/09/2021)   [place]     Feeling of Stress : Not at all  Social Connections: Moderately Integrated (11/09/2021)   Social Connection and Isolation Panel [NHANES]    Frequency of Communication with Friends and Family: More than three times a week    Frequency of Social Gatherings with Friends and Family: Once a week    Attends Religious Services: More than 4 times per year    Active Member of [employer] or Organizations: Yes    Attends Music therapist: More than 4 times per year    Marital Status: Divorced     Family History: The patient's ***family history includes Congestive Heart Failure in her mother; Dementia in her mother; Heart disease (age of onset: 71) in her mother; Hypertension in an other family member; Valvular heart disease in her father. There is no history of Colon cancer or Rectal cancer.  ROS:  Please see the history of present illness.    *** All other systems reviewed and are negative.  EKGs/Labs/Other Studies Reviewed:    The following studies were reviewed today: TTE Oct 30, 2021: IMPRESSIONS     1. SAM of the anterior mitral leaflet with turbulent outflow through LVOT  After Valsallva maneuver, peak gradient through the LV, LVOT, AV is 47 mm  Hg. Marland Kitchen Left ventricular ejection fraction, by estimation, is 55 to 60%. The  left ventricle has normal  function. There is mild left ventricular hypertrophy. Left ventricular  diastolic parameters are consistent with Grade I diastolic dysfunction  (impaired relaxation). Elevated left atrial pressure.   2. Right ventricular systolic function is normal. The right ventricular  size is normal. Mildly increased right ventricular wall thickness. There  is normal pulmonary artery systolic pressure.   3. Left atrial size was mildly dilated.   4. Peak and mean gradients through the valve are 8 and 2 mm Hg  respectively. MVA by PT1/2 is 2.1 cm2.. Moderate mitral valve  regurgitation. Mild mitral stenosis. Severe mitral annular calcification.   5. S/p  TAVR (26 mm Medtronic Evolut, procedure date 09/08/21). Peak and  mean gradients through the valve are. Peak and mean gradients through the  valve are 23 and 12 mm Hg respectively. Mild perivalvular AI. Marland Kitchen The aortic  valve has been repaired/replaced.   There is a 26 mm Medtronic Evolut valve present in the aortic position.  Procedure Date: 09/08/2021.   LHC/RHC 03/2021:    Mid RCA lesion is 50% stenosed.   Hemodynamic findings consistent with mild pulmonary hypertension.   There is severe aortic valve stenosis.   1.  Calcified but nonobstructive coronary artery disease with patency of the left main, LAD, left circumflex, ramus intermedius, and right coronary arteries 2.  Heavy mitral annular calcification with known moderate to severe mitral regurgitation and mild mitral stenosis by echo assessment 3.  Severe aortic stenosis with mean transvalvular gradient 27 mmHg and calculated aortic valve area 0.97 cm (probable combination of valvular and subvalvular stenosis) 4.  Mild pulmonary hypertension with mean pulmonary artery pressure 28 mmHg, transpulmonary gradient 9 mmHg, V wave 24 mmHg.   Continued evaluation of valvular heart disease with consideration of cardiac surgery  EKG:  EKG is *** ordered today.  The ekg ordered today demonstrates ***  Recent Labs: 09/09/2021: Magnesium 2.0 11/17/2021: TSH 1.59 12/21/2021: ALT 15; BUN 10; Creatinine, Ser 0.63; Hemoglobin 12.6; Platelets 265.0; Potassium 3.4; Sodium 137  Recent Lipid Panel    Component Value Date/Time   CHOL 158 11/17/2021 1155   CHOL 199 05/21/2020 1024   TRIG 178.0 (H) 11/17/2021 1155   HDL 55.40 11/17/2021 1155   HDL 78 05/21/2020 1024   CHOLHDL 3 11/17/2021 1155   VLDL 35.6 11/17/2021 1155   LDLCALC 67 11/17/2021 1155   LDLCALC 92 05/21/2020 1024   LDLDIRECT 133.8 12/19/2012 0828     Risk Assessment/Calculations:   {Does this patient have ATRIAL FIBRILLATION?:[phone]}  No BP recorded.  {Refresh Note OR Click  here to enter BP  :1}***         Physical Exam:    VS:  There were no vitals taken for this visit.    Wt Readings from Last 3 Encounters:  03/29/22 122 lb 6 oz (55.5 kg)  03/22/22 121 lb (54.9 kg)  03/08/22 121 lb (54.9 kg)     GEN: *** Well nourished, well developed in no acute distress HEENT: Normal NECK: No JVD; No carotid bruits  LYMPHATICS: No lymphadenopathy CARDIAC: ***RRR, no murmurs, rubs, gallops RESPIRATORY:  Clear to auscultation without rales, wheezing or rhonchi  ABDOMEN: Soft, non-tender, non-distended MUSCULOSKELETAL:  No edema; No deformity  SKIN: Warm and dry NEUROLOGIC:  Alert and oriented x 3 PSYCHIATRIC:  Normal affect   ASSESSMENT:    No diagnosis found. PLAN:    In order of problems listed above:  #Severe AS s/p TAVR: S/p TAVR with Medtronic Evolut FX 62m on 08/2021. Pre-op cath with nonobstructive CAD. TTE 09/2021 with LVEF 55-60%, SAM with LVOT peak gradient 48mg with valsalva, moderate MR, mild MS, severe MAC, mean AoV gradient 1258m with mild perivalvular AR. Currently doing well. -Continue SBE ppx -Not on ASA due to need for [facility] severe basal [facility]al thickness: TTE with LVOT peak gradient 1m80mwith valsalva. Currently, doing well with no chest pain, SOB, or HF symptoms. -Continue metop 50mg59mdaily  #Paroxysmal Afib: -Continue metop 50mg 23maily -Continue apixaban 5mg BI32m#Mild Calcific MS: #Moderate MR: #Severe MAC: -Continue serial monitoring with annual TTEs as above  #HLD: -Continue crestor 20mg da69m #Nonobstructive CAD: No anginal symptoms. -Continue crestor 20mg dai59mNot on ASA due to need for AC      {[facility] you ordering a CV Procedure (e.g. stress test, cath, DCCV, TEE, etc)?   Press F2        :[id number]}ation Adjustments/Labs and Tests Ordered: Current medicines are reviewed at length with the patient today.  Concerns regarding medicines are outlined above.  No orders of the defined types  were placed in this encounter.  No orders of the defined types were placed in this encounter.   There are no Patient Instructions on file for this visit.   Signed, Neils Siracusa EFreada [name]/2024 1:49 PM    [facility]

## 2022-05-06 ENCOUNTER — Encounter: Payer: Self-pay | Admitting: Certified Registered Nurse Anesthetist

## 2022-05-06 ENCOUNTER — Ambulatory Visit: Payer: Medicare HMO | Admitting: Cardiology

## 2022-05-10 ENCOUNTER — Ambulatory Visit (AMBULATORY_SURGERY_CENTER): Payer: Medicare HMO | Admitting: Gastroenterology

## 2022-05-10 ENCOUNTER — Other Ambulatory Visit (HOSPITAL_COMMUNITY): Payer: Self-pay

## 2022-05-10 ENCOUNTER — Encounter: Payer: Self-pay | Admitting: Gastroenterology

## 2022-05-10 VITALS — BP 100/61 | HR 73 | Temp 98.9°F | Resp 10 | Ht 61.0 in | Wt 121.0 lb

## 2022-05-10 DIAGNOSIS — D122 Benign neoplasm of ascending colon: Secondary | ICD-10-CM | POA: Diagnosis not present

## 2022-05-10 DIAGNOSIS — Z09 Encounter for follow-up examination after completed treatment for conditions other than malignant neoplasm: Secondary | ICD-10-CM

## 2022-05-10 DIAGNOSIS — D123 Benign neoplasm of transverse colon: Secondary | ICD-10-CM | POA: Diagnosis not present

## 2022-05-10 DIAGNOSIS — Z860100 Personal history of colon polyps, unspecified: Secondary | ICD-10-CM

## 2022-05-10 DIAGNOSIS — D120 Benign neoplasm of cecum: Secondary | ICD-10-CM

## 2022-05-10 DIAGNOSIS — I2510 Atherosclerotic heart disease of native coronary artery without angina pectoris: Secondary | ICD-10-CM | POA: Diagnosis not present

## 2022-05-10 DIAGNOSIS — I509 Heart failure, unspecified: Secondary | ICD-10-CM | POA: Diagnosis not present

## 2022-05-10 MED ORDER — SODIUM CHLORIDE 0.9 % IV SOLN: 500.0000 mL | Freq: Once | INTRAVENOUS | Status: DC

## 2022-05-10 NOTE — Telephone Encounter (Signed)
Pharmacy Patient Advocate Encounter  Insurance verification completed.    The patient is insured through [employer]   Ran test claims for: Prolia 60mg.  Pharmacy benefit copay: $100.00  

## 2022-05-10 NOTE — Op Note (Addendum)
[facility] Patient Name: Carrie [name] Procedure Date: 05/10/2022 2:44 PM MRN: [id number] Endoscopist: Mauri Pole , MD, [id number] Age: 77 Referring MD:  Date of Birth: 07/30/1945 Gender: Female Account #: [id number] Procedure:                Colonoscopy Indications:              High risk colon cancer surveillance: Personal                            history of colonic polyps, High risk colon cancer                            surveillance: Personal history of adenoma (10 mm or                            greater in size), High risk colon cancer                            surveillance: Personal history of multiple (3 or                            more) adenomas Medicines:                Monitored Anesthesia Care Procedure:                Pre-Anesthesia Assessment:                           - Prior to the procedure, a History and Physical                            was performed, and patient medications and                            allergies were reviewed. The patient's tolerance of                            previous anesthesia was also reviewed. The risks                            and benefits of the procedure and the sedation                            options and risks were discussed with the patient.                            All questions were answered, and informed consent                            was obtained. Prior Anticoagulants: The patient                            last took Eliquis (apixaban) 2 days prior to the  procedure. ASA Grade Assessment: III - A patient                            with severe systemic disease. After reviewing the                            risks and benefits, the patient was deemed in                            satisfactory condition to undergo the procedure.                           After obtaining informed consent, the colonoscope                            was passed under direct vision.  Throughout the                            procedure, the patient's blood pressure, pulse, and                            oxygen saturations were monitored continuously. The                            Olympus PCF-H190DL (#[id number]) Colonoscope was                            introduced through the anus and advanced to the the                            cecum, identified by appendiceal orifice and                            ileocecal valve. The colonoscopy was performed                            without difficulty. The patient tolerated the                            procedure well. The quality of the bowel                            preparation was good. The ileocecal valve,                            appendiceal orifice, and rectum were photographed. Scope In: 1:91:47 PM Scope Out: 3:16:07 PM Scope Withdrawal Time: 0 hours 13 minutes 31 seconds  Total Procedure Duration: 0 hours 19 minutes 8 seconds  Findings:                 The perianal and digital rectal examinations were                            normal.  Three sessile polyps were found in the transverse                            colon, ascending colon and cecum. The polyps were 1                            to 2 mm in size. These polyps were removed with a                            cold biopsy forceps. Resection and retrieval were                            complete.                           A 4 mm polyp was found in the transverse colon. The                            polyp was sessile. The polyp was removed with a                            cold snare. Resection and retrieval were complete.                           Scattered small-mouthed diverticula were found in                            the sigmoid colon and descending colon.                           Non-bleeding external and internal hemorrhoids were                            found during retroflexion. The hemorrhoids were                             small. Complications:            No immediate complications. Estimated Blood Loss:     Estimated blood loss was minimal. Impression:               - Three 1 to 2 mm polyps in the transverse colon,                            in the ascending colon and in the cecum, removed                            with a cold biopsy forceps. Resected and retrieved.                           - One 4 mm polyp in the transverse colon, removed                            with a cold  snare. Resected and retrieved.                           - Diverticulosis in the sigmoid colon and in the                            descending colon.                           - Non-bleeding external and internal hemorrhoids. Recommendation:           - Patient has a contact number available for                            emergencies. The signs and symptoms of potential                            delayed complications were discussed with the                            patient. Return to normal activities tomorrow.                            Written discharge instructions were provided to the                            patient.                           - Resume previous diet.                           - Continue present medications.                           - Await pathology results.                           - No repeat colonoscopy due to age.                           - Resume Eliquis (apixaban) at prior dose tomorrow.                            Refer to managing physician for further adjustment                            of therapy. Mauri Pole, MD 05/10/2022 3:24:02 PM This report has been signed electronically.

## 2022-05-10 NOTE — Patient Instructions (Addendum)
Recommendation: Patient has a contact number available for                            emergencies. The signs and symptoms of potential                            delayed complications were discussed with the                            patient. Return to normal activities tomorrow.                            Written discharge instructions were provided to the                            patient.                           - Resume previous diet.                           - Continue present medications.                           - Await pathology results.                           - No repeat colonoscopy due to age.                           - Resume Eliquis (apixaban) at prior dose tomorrow.                            Refer to managing physician for further adjustment      YOU HAD AN ENDOSCOPIC PROCEDURE TODAY AT [facility]:   Refer to the procedure report that was given to you for any specific questions about what was found during the examination.  If the procedure report does not answer your questions, please call your gastroenterologist to clarify.  If you requested that your care partner not be given the details of your procedure findings, then the procedure report has been included in a sealed envelope for you to review at your convenience later.  YOU SHOULD EXPECT: Some feelings of bloating in the abdomen. Passage of more gas than usual.  Walking can help get rid of the air that was put into your GI tract during the procedure and reduce the bloating. If you had a lower endoscopy (such as a colonoscopy or flexible sigmoidoscopy) you may notice spotting of blood in your stool or on the toilet paper. If you underwent a bowel prep for your procedure, you may not have a normal bowel movement for a few days.  Please Note:  You might notice some irritation and congestion in your nose or some drainage.  This is from the oxygen used during your procedure.  There is no need for concern  and it should clear up in a day or so.  SYMPTOMS TO REPORT IMMEDIATELY:  Following lower endoscopy (colonoscopy or flexible sigmoidoscopy):  Excessive  amounts of blood in the stool  Significant tenderness or worsening of abdominal pains  Swelling of the abdomen that is new, acute  Fever of 100F or higher  For urgent or emergent issues, a gastroenterologist can be reached at any hour by calling [phone]. Do not use MyChart messaging for urgent concerns.   DIET:  We do recommend a small meal at first, but then you may proceed to your regular diet.  Drink plenty of fluids but you should avoid alcoholic beverages for 24 hours.  ACTIVITY:  You should plan to take it easy for the rest of today and you should NOT DRIVE or use heavy machinery until tomorrow (because of the sedation medicines used during the test).    FOLLOW UP: Our staff will call the number listed on your records the next business day following your procedure.  We will call around 7:15- 8:00 am to check on you and address any questions or concerns that you may have regarding the information given to you following your procedure. If we do not reach you, we will leave a message.     If any biopsies were taken you will be contacted by phone or by letter within the next 1-3 weeks.  Please call us at [phone] if you have not heard about the biopsies in 3 weeks.   SIGNATURES/CONFIDENTIALITY: You and/or your care partner have signed paperwork which will be entered into your electronic medical record.  These signatures attest to the fact that that the information above on your After Visit Summary has been reviewed and is understood.  Full responsibility of the confidentiality of this discharge information lies with you and/or your care-partner.

## 2022-05-10 NOTE — Progress Notes (Unsigned)
[facility] History and Physical   Primary Care Physician:  [name], MD   Reason for Procedure:  History of adenomatous colon polyps  Plan:    Surveillance colonoscopy with possible interventions as needed     HPI: Carrie [name] is a very pleasant 77 y.o. female here for surveillance colonoscopy. Denies any nausea, vomiting, abdominal pain, melena or bright red blood per rectum  The risks and benefits as well as alternatives of endoscopic procedure(s) have been discussed and reviewed. All questions answered. The patient agrees to proceed.    Past Medical History:  Diagnosis Date   Coronary artery disease    a. Prior LHC (07/24/03): LMCA heavily calcified without significant stenosis, LAD with heavy calcification proximally. 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 CBurt Knack& Dr. BCyndia Bent  Severe aortic stenosis    Wears hearing aid    left    Past Surgical History:  Procedure Laterality Date   ATRIAL ABLATION SURGERY  04/27/2003   BREAST SURGERY  04/26/1980   Benign tumor removed RT   BUNIONECTOMY  04/07/2012   Procedure: BUNIONECTOMY;  Surgeon: JAlta Corning MD;  [facility] [facility]  Service: Orthopedics;  Laterality: Left;  CHEVRON OSTEOTOMY LEFT FOOT    CARDIAC CATHETERIZATION  04/27/2003   EYE SURGERY     bilateral cataracts   GANGLION CYST EXCISION     left wrist    HERNIA REPAIR  04/26/1988   rt ing   INTRAOPERATIVE TRANSTHORACIC ECHOCARDIOGRAM N/A 09/08/2021   Procedure: INTRAOPERATIVE TRANSTHORACIC ECHOCARDIOGRAM;  Surgeon: CSherren Mocha MD;  [facility];  Service: Open Heart Surgery;  Laterality: N/A;   RIGHT/LEFT HEART CATH AND CORONARY ANGIOGRAPHY N/A 04/22/2021   Procedure: RIGHT/LEFT HEART CATH AND CORONARY ANGIOGRAPHY;  Surgeon: CSherren Mocha MD;  [facility];  Service: Cardiovascular;  Laterality: N/A;   TONSILLECTOMY     TRANSCATHETER AORTIC VALVE REPLACEMENT, TRANSFEMORAL N/A 09/08/2021   Procedure: Transcatheter Aortic Valve Replacement, Transfemoral;  Surgeon: CSherren Mocha MD;  [facility];  Service: Open Heart Surgery;  Laterality: N/A;   TUBAL LIGATION      Prior to Admission medications   Medication Sig Start Date End Date Taking? Authorizing Provider  Cholecalciferol (VITAMIN D3) 1000 UNITS CAPS Take 1 capsule by mouth daily.   Yes [provider]  metoprolol succinate (TOPROL-XL) 50 MG 24 hr tablet TAKE 1 TABLET BY MOUTH DAILY. TAKE WITH OR IMMEDIATELY FOLLOWING A MEAL. 04/28/22  Yes CSherren Mocha MD  potassium chloride (KLOR-CON M) 10 MEQ tablet Take 1 tablet (10 mEq total) by mouth 2 (two) times daily. 03/08/22  Yes JJanith Lima MD  rosuvastatin (CRESTOR) 20 MG tablet TAKE 1 TABLET BY MOUTH EVERY DAY 04/27/22  Yes PFreada Bergeron MD  vitamin B-12 (CYANOCOBALAMIN) 1000 MCG tablet Take 1,000 mcg by mouth daily.   Yes [provider]  denosumab (PROLIA) 60 MG/ML  SOSY injection Inject 60 mg into the skin every 6 (six) months.    [provider]  ELIQUIS 5 MG TABS tablet TAKE 1 TABLET BY MOUTH TWICE A DAY 03/25/22   [name], MD    Current Outpatient Medications  Medication Sig Dispense Refill   Cholecalciferol (VITAMIN D3) 1000 UNITS CAPS Take 1 capsule by mouth daily.     metoprolol succinate (TOPROL-XL) 50 MG 24 hr tablet TAKE 1  TABLET BY MOUTH DAILY. TAKE WITH OR IMMEDIATELY FOLLOWING A MEAL. 90 tablet 0   potassium chloride (KLOR-CON M) 10 MEQ tablet Take 1 tablet (10 mEq total) by mouth 2 (two) times daily. 180 tablet 0   rosuvastatin (CRESTOR) 20 MG tablet TAKE 1 TABLET BY MOUTH EVERY DAY 90 tablet 0   vitamin B-12 (CYANOCOBALAMIN) 1000 MCG tablet Take 1,000 mcg by mouth daily.     denosumab (PROLIA) 60 MG/ML SOSY injection Inject 60 mg into the skin every 6 (six) months.     ELIQUIS 5 MG TABS tablet TAKE 1 TABLET BY MOUTH TWICE A DAY 180 tablet 1   Current Facility-Administered Medications  Medication Dose Route Frequency Provider Last Rate Last Admin   0.9 %  sodium chloride infusion  500 mL Intravenous Once Mauri Pole, MD        Allergies as of 05/10/2022 - Review Complete 05/10/2022  Allergen Reaction Noted   Codeine Itching and Nausea And Vomiting 03/11/2010   Crestor [rosuvastatin] Other (See Comments) 06/23/2012   Latex Rash    Tape Rash 09/04/2021    Family History  Problem Relation Age of Onset   Heart disease Mother 58   Congestive Heart Failure Mother    Dementia Mother    Hypertension Other    Valvular heart disease Father        Had valves replaced   Colon cancer Neg Hx    Rectal cancer Neg Hx     Social History   Socioeconomic History   Marital status: Divorced    Spouse name: Not on file   Number of children: 2   Years of education: Not on file   Highest education level: Not on file  Occupational History   Occupation: retired  Tobacco Use   Smoking status: Former    Types: Cigarettes    Quit date: 04/05/1999    Years since quitting: 23.1   Smokeless tobacco: Never   Tobacco comments:    Regular Exercise-No  Vaping Use   Vaping Use: Never used  Substance and Sexual Activity   Alcohol use: Yes    Alcohol/week: 7.0 standard drinks of alcohol    Types: 7 Glasses of wine per week    Comment: 1 drink per week   Drug use: No   Sexual activity: Not Currently     Birth control/protection: Post-menopausal  Other Topics Concern   Not on file  Social History Narrative   Not on file   Social Determinants of Health   Financial Resource Strain: Low Risk  (11/09/2021)   Overall Financial Resource Strain (CARDIA)    Difficulty of Paying Living Expenses: Not hard at all  Food Insecurity: No Food Insecurity (11/09/2021)   Hunger Vital Sign    Worried About Running Out of Food in the Last Year: Never true    Ran Out of Food in the Last Year: Never true  Transportation Needs: No Transportation Needs (11/09/2021)   PRAPARE - Transportation    Lack of Transportation (Medical): No    Lack  of Transportation (Non-Medical): No  Physical Activity: Sufficiently Active (11/09/2021)   Exercise Vital Sign    Days of Exercise per Week: 5 days    Minutes of Exercise per Session: 30 min  Recent Concern: Physical Activity - Inactive (11/09/2021)   Exercise Vital Sign    Days of Exercise per Week: 5 days    Minutes of Exercise per Session: 0 min  Stress: No Stress Concern Present (11/09/2021)   [place]    Feeling of Stress : Not at all  Social Connections: Moderately Integrated (11/09/2021)   Social Connection and Isolation Panel [NHANES]    Frequency of Communication with Friends and Family: More than three times a week    Frequency of Social Gatherings with Friends and Family: Once a week    Attends Religious Services: More than 4 times per year    Active Member of [employer] or Organizations: Yes    Attends Music therapist: More than 4 times per year    Marital Status: Divorced  Intimate Partner Violence: Not At Risk (11/09/2021)   Humiliation, Afraid, Rape, and Kick questionnaire    Fear of Current or Ex-Partner: No    Emotionally Abused: No    Physically Abused: No    Sexually Abused: No    Review of Systems:  All other review of systems negative except as mentioned in the  HPI.  Physical Exam: Vital signs in last 24 hours: Blood Pressure (Abnormal) 145/79   Pulse (Abnormal) 102   Temperature 98.9 F (37.2 C)   Height '5\' 1"'$  (1.549 m)   Weight 121 lb (54.9 kg)   Oxygen Saturation 97%   Body Mass Index 22.86 kg/m  General:   Alert, NAD Lungs:  Clear .   Heart:  Regular rate and rhythm Abdomen:  Soft, nontender and nondistended. Neuro/Psych:  Alert and cooperative. Normal mood and affect. A and O x 3  Reviewed labs, radiology imaging, old records and pertinent past GI work up  Patient is appropriate for planned procedure(s) and anesthesia in an ambulatory setting   K. Denzil Magnuson , MD [phone]

## 2022-05-10 NOTE — Telephone Encounter (Signed)
Pharmacy benefit is cheaper for the patient. Send rx to [facility] 

## 2022-05-10 NOTE — Telephone Encounter (Addendum)
Pt ready for scheduling on or after 05/13/22   Out-of-pocket cost due at time of visit: $301   Primary: [employer] Prol[place] co-insurance: 20% (approximately $276) Admin fee co-insurance: 20% (approximately $25)   Secondary: n/a Prolia co-insurance:  Admin fee co-insurance:    Deductible: does not apply   Prior Auth: approved PA# [id number] Valid: 09/09/22-09/09/23   ** This summary of benefits is an estimation of the patient's out-of-pocket cost. Exact cost may vary based on individual plan coverage.

## 2022-05-10 NOTE — Progress Notes (Signed)
Called to room to assist during endoscopic procedure.  Patient ID and intended procedure confirmed with present staff. Received instructions for my participation in the procedure from the performing physician.

## 2022-05-10 NOTE — Progress Notes (Unsigned)
Report given to PACU, vss 

## 2022-05-11 ENCOUNTER — Telehealth: Payer: Self-pay

## 2022-05-11 NOTE — Telephone Encounter (Signed)
Left message on follow up call. 

## 2022-05-12 ENCOUNTER — Telehealth: Payer: Self-pay

## 2022-05-12 NOTE — Telephone Encounter (Signed)
**Note De-Identified Larosa Rhines Obfuscation** We received a letter from [facility] requesting that we complete the providers page of the pts application for Eliquis assistance attached.  I have completed the MD Page of the application and have e-mailed it to the nurse who will be working with Dr Burt Knack tomorrow so she can obtain his signature, date it, and to fax to [facility] at the fax number written on the cover letter included.

## 2022-05-13 NOTE — Telephone Encounter (Signed)
Provider's page signed by Dr Burt Knack and faxed with confirmation received.

## 2022-05-19 ENCOUNTER — Other Ambulatory Visit: Payer: Self-pay | Admitting: Internal Medicine

## 2022-05-19 ENCOUNTER — Other Ambulatory Visit (HOSPITAL_COMMUNITY): Payer: Self-pay

## 2022-05-19 DIAGNOSIS — M810 Age-related osteoporosis without current pathological fracture: Secondary | ICD-10-CM

## 2022-05-19 MED ORDER — DENOSUMAB 60 MG/ML ~~LOC~~ SOSY
60.0000 mg | PREFILLED_SYRINGE | SUBCUTANEOUS | 1 refills | Status: DC
  Filled 2022-05-19: qty 60, fill #0

## 2022-05-21 ENCOUNTER — Other Ambulatory Visit (HOSPITAL_COMMUNITY): Payer: Self-pay

## 2022-05-24 ENCOUNTER — Ambulatory Visit: Payer: Medicare HMO | Admitting: Internal Medicine

## 2022-06-01 ENCOUNTER — Encounter: Payer: Self-pay | Admitting: Gastroenterology

## 2022-06-03 ENCOUNTER — Other Ambulatory Visit: Payer: Self-pay | Admitting: Internal Medicine

## 2022-06-03 ENCOUNTER — Other Ambulatory Visit: Payer: Self-pay | Admitting: Cardiovascular Disease

## 2022-06-03 DIAGNOSIS — E876 Hypokalemia: Secondary | ICD-10-CM

## 2022-06-03 DIAGNOSIS — I2510 Atherosclerotic heart disease of native coronary artery without angina pectoris: Secondary | ICD-10-CM

## 2022-06-03 DIAGNOSIS — I10 Essential (primary) hypertension: Secondary | ICD-10-CM

## 2022-06-11 ENCOUNTER — Other Ambulatory Visit (HOSPITAL_COMMUNITY): Payer: Self-pay

## 2022-06-11 NOTE — Progress Notes (Unsigned)
Cardiology Office Note:    Date:  06/14/2022   ID:  Carrie [name], Carrie [name] Apr 07, 1946, MRN [id number]  PCP:  [name], MD   [facility] Cardiologist:  Freada Bergeron, MD {   Referring MD: [name], MD    History of Present Illness:    Carrie [name] is a 77 y.o. female with a hx of moderate nonobstructive CAD, remote SVT ablation, HOCM with LVOT obstruction, paroxysmal atrial fibrillation on Eliquis, severe MAC, mod-severe MR with mild MS and severe AS s/p TAVR who presents to clinic for follow-up.  Per review of the record, patient has a history of cardiac cath in 2005 which showed moderate stenosis of the mid-LAD. She underwent an SVT ablation in 2005. She was seen by Dr. Burt Knack in 02/2021 due to severe AS. Due to the complexity of her cardiac disease however was felt to be a poor candidate for surgery due to extensive calcification in the ascending aorta and aortic arch as well as the need for complex surgery which would involve double valve replacement and septal myectomy. She was felt to be a better candidate for TAVR at that time.   She then started experiencing progressive symptoms of fatigue and upper back pain with physical activity. A repeat echocardiogram at that time confirmed worsening valvular aortic stenosis with an LVEF 60%, severe basal septal hypertrophy with SAM and LVOT gradient of 3.5 m/s with valsalva c/w HOCM. Aov mean grad 49.3 mmHg, peak grad 84.4 mmHg, AVA 0.57 cm2, DVI 0.23 moderate to severe MR with mild MS and severe MAC. Mild AR. BSA: 1.6 m2.    Cardiac catheterization performed 04/22/21 showed calcified but nonobstructive coronary artery disease with patency of the left main, LAD, left circumflex, ramus intermedius, and right coronary arteries. Severe aortic stenosis with mean transvalvular gradient 27 mmHg and calculated aortic valve area 0.97 cm (probable combination of valvular and subvalvular stenosis). Mild  pulmonary hypertension with mean pulmonary artery pressure 28 mmHg, transpulmonary gradient 9 mmHg, V wave 24 mmHg.    She underwent TAVR on 09/08/21 with a Medtronic Evolut FX 45m via the TF approach. There was significant leak after valve deployment therefore valve was post dilated with improvement. Post operative echo with mean gradient measures at 21.5 mmHg, peak gradient measures 43.493mg and AVA by VTI measures 1.68 cm. She was restarted on Eliquis with no issues.   Was last seen in clinic on 09/2021 by JiRella Larvehere she was doing very well with no cardiac symptoms.  Today, the patient overall feels well today. No chest pain, SOB, orthopnea, PND, palpitations, or LE edema. Remains active and walks about 3m35m/day and feels well with activity. No lightheadedness, dizziness, or syncope. Tolerating medications as prescribed. Has had episodes of epistaxis that have been worse during the winter months but she has been able to get it to stop.   Blood pressure has been running 110/70s at home.     Past Medical History:  Diagnosis Date   Coronary artery disease    a. Prior LHC (07/24/03): LMCA heavily calcified without significant stenosis, LAD with heavy calcification proximally. 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 CBurt Knack& Dr. BCyndia Bent  Severe aortic stenosis    Wears hearing aid    left    Past Surgical History:  Procedure  Laterality Date   ATRIAL ABLATION SURGERY  04/27/2003   BREAST SURGERY  04/26/1980   Benign tumor removed RT   BUNIONECTOMY  04/07/2012   Procedure: BUNIONECTOMY;  Surgeon: JAlta Corning MD;  [facility]  Service: Orthopedics;  Laterality: Left;  CHEVRON OSTEOTOMY LEFT FOOT    CARDIAC CATHETERIZATION  04/27/2003   EYE SURGERY     bilateral cataracts   GANGLION CYST EXCISION     left wrist   HERNIA REPAIR  04/26/1988   rt ing   INTRAOPERATIVE TRANSTHORACIC ECHOCARDIOGRAM N/A 09/08/2021   Procedure: INTRAOPERATIVE TRANSTHORACIC ECHOCARDIOGRAM;  Surgeon: CSherren Mocha MD;  [facility];  Service: Open Heart Surgery;  Laterality: N/A;   RIGHT/LEFT HEART CATH AND CORONARY ANGIOGRAPHY N/A 04/22/2021   Procedure: RIGHT/LEFT HEART CATH AND CORONARY ANGIOGRAPHY;  Surgeon: CSherren Mocha MD;  [facility];  Service: Cardiovascular;  Laterality: N/A;   TONSILLECTOMY     TRANSCATHETER AORTIC VALVE REPLACEMENT, TRANSFEMORAL N/A 09/08/2021   Procedure: Transcatheter Aortic Valve Replacement, Transfemoral;  Surgeon: CSherren Mocha MD;  [facility];  Service: Open Heart Surgery;  Laterality: N/A;   TUBAL LIGATION      Current Medications: Current Meds  Medication Sig   Cholecalciferol (VITAMIN D3) 1000 UNITS CAPS Take 1 capsule by mouth daily.   denosumab (PROLIA) 60 MG/ML SOSY injection Inject 60 mg into the skin every 6 (six) months.   KLOR-CON M10 10 MEQ tablet TAKE 1 TABLET BY MOUTH 2 TIMES DAILY.   metoprolol succinate (TOPROL-XL) 50 MG 24 hr tablet TAKE 1 TABLET BY MOUTH EVERY DAY WITH OR IMMEDIATELY FOLLOWING A MEAL   rosuvastatin (CRESTOR) 20 MG tablet TAKE 1 TABLET BY MOUTH EVERY DAY   vitamin B-12 (CYANOCOBALAMIN) 1000 MCG tablet Take 1,000 mcg by mouth daily.   [DISCONTINUED] ELIQUIS 5 MG TABS tablet TAKE 1 TABLET BY MOUTH TWICE A DAY     Allergies:   Codeine, Crestor [rosuvastatin], Latex, and Tape    Social History   Socioeconomic History   Marital status: Divorced    Spouse name: Not on file   Number of children: 2   Years of education: Not on file   Highest education level: Not on file  Occupational History   Occupation: retired  Tobacco Use   Smoking status: Former    Types: Cigarettes    Quit date: 04/05/1999    Years since quitting: 23.2   Smokeless tobacco: Never   Tobacco comments:    Regular Exercise-No  Vaping Use   Vaping Use: Never used  Substance and Sexual Activity   Alcohol use: Yes    Alcohol/week: 7.0 standard drinks of alcohol    Types: 7 Glasses of wine per week    Comment: 1 drink per week   Drug use: No   Sexual activity: Not Currently    Birth control/protection: Post-menopausal  Other Topics Concern   Not on file  Social History Narrative   Not on file   Social Determinants of Health   Financial Resource Strain: Low Risk  (11/09/2021)  Overall Financial Resource Strain (CARDIA)    Difficulty of Paying Living Expenses: Not hard at all  Food Insecurity: No Food Insecurity (11/09/2021)   Hunger Vital Sign    Worried About Running Out of Food in the Last Year: Never true    Ran Out of Food in the Last Year: Never true  Transportation Needs: No Transportation Needs (11/09/2021)   PRAPARE - Hydrologist (Medical): No    Lack of Transportation (Non-Medical): No  Physical Activity: Sufficiently Active (11/09/2021)   Exercise Vital Sign    Days of Exercise per Week: 5 days    Minutes of Exercise per Session: 30 min  Recent Concern: Physical Activity - Inactive (11/09/2021)   Exercise Vital Sign    Days of Exercise per Week: 5 days    Minutes of Exercise per Session: 0 min  Stress: No Stress Concern Present (11/09/2021)   [place]    Feeling of Stress : Not at all  Social Connections: Moderately Integrated (11/09/2021)   Social Connection and  Isolation Panel [NHANES]    Frequency of Communication with Friends and Family: More than three times a week    Frequency of Social Gatherings with Friends and Family: Once a week    Attends Religious Services: More than 4 times per year    Active Member of [employer] or Organizations: Yes    Attends Music therapist: More than 4 times per year    Marital Status: Divorced     Family History: The patient's family history includes Congestive Heart Failure in her mother; Dementia in her mother; Heart disease (age of onset: 63) in her mother; Hypertension in an other family member; Valvular heart disease in her father. There is no history of Colon cancer or Rectal cancer.  ROS:   Please see the history of present illness.     All other systems reviewed and are negative.  EKGs/Labs/Other Studies Reviewed:    The following studies were reviewed today: Echocardiogram 10/19/21:   1. SAM of the anterior mitral leaflet with turbulent outflow through LVOT  After Valsallva maneuver, peak gradient through the LV, LVOT, AV is 47 mm  Hg. Marland Kitchen Left ventricular ejection fraction, by estimation, is 55 to 60%. The  left ventricle has normal  function. There is mild left ventricular hypertrophy. Left ventricular  diastolic parameters are consistent with Grade I diastolic dysfunction  (impaired relaxation). Elevated left atrial pressure.   2. Right ventricular systolic function is normal. The right ventricular  size is normal. Mildly increased right ventricular wall thickness. There  is normal pulmonary artery systolic pressure.   3. Left atrial size was mildly dilated.   4. Peak and mean gradients through the valve are 8 and 2 mm Hg  respectively. MVA by PT1/2 is 2.1 cm2.. Moderate mitral valve  regurgitation. Mild mitral stenosis. Severe mitral annular calcification.   5. S/p TAVR (26 mm Medtronic Evolut, procedure date 09/08/21). Peak and  mean gradients through the valve are. Peak and mean  gradients through the  valve are 23 and 12 mm Hg respectively. Mild perivalvular AI. Marland Kitchen The aortic  valve has been repaired/replaced.   There is a 26 mm Medtronic Evolut valve present in the aortic position.  Procedure Date: 09/08/2021.      TAVR OPERATIVE NOTE       Date of Procedure:  09/08/2021   Preoperative Diagnosis:      Severe Aortic Stenosis    Postoperative Diagnosis:    Same    Procedure:        Transcatheter Aortic Valve Replacement - Percutaneous Right Transfemoral Approach             Medtronic Evolut FX (size 26 mm,  serial # [id number])              Co-Surgeons:                        Gaye Pollack, MD and [name], MD     Anesthesiologist:                  Annye Asa, MD   Echocardiographer:              Sanda Klein, MD   Pre-operative Echo Findings: Severe aortic stenosis Normal left ventricular systolic function   Post-operative Echo Findings: Trace paravalvular leak Normal left ventricular systolic function _____________   Echo 09/09/21:    IMPRESSIONS Left ventricular ejection fraction, by estimation, is 70 to 75%. The left ventricle has hyperdynamic function. The left ventricle has no regional wall motion abnormalities. There is moderate asymmetric left ventricular hypertrophy of the basal-septal segment. Left ventricular diastolic parameters are consistent with Grade I diastolic dysfunction (impaired relaxation). 1. Right ventricular systolic function is normal. The right ventricular size is normal. There is mildly elevated pulmonary artery systolic pressure. 2. 3. Left atrial size was severely dilated. There is at most mild mitral stenosis. Mitral gradients are high due to tachycardia and severe mitral insufficiency. There is moderate to severe systolic anterior motion of the mitral valve with secondary mitral insufficiency and LV outflow obstruction. The mitral valve is degenerative. Severe mitral valve  regurgitation. The mean mitral valve gradient is 8.5 mmHg with average heart rate of 105 bpm. Severe mitral annular calcification. 4. Aortic valve gradients and calculated aortic valve area are inaccurate due to subvalvular dynamic obstruction due to HOCM pathophysiology. Dimensionless valve index is consistent with normal prosthetic valve function.. The aortic valve has been repaired/replaced. Aortic valve regurgitation is not visualized. There is a 26 mm Sapien prosthetic (TAVR) valve present in the aortic position. Echo findings are consistent with normal structure and function of the aortic valve prosthesis. Aortic valve Vmax measures 3.30 m/s. 5. 6. The inferior vena cava is normal in size with greater than 50% respiratory variability,  EKG:  EKG is not ordered today.   Recent Labs: 09/09/2021: Magnesium 2.0 11/17/2021: TSH 1.59 12/21/2021: ALT 15; BUN 10; Creatinine, Ser 0.63; Hemoglobin 12.6; Platelets 265.0; Potassium 3.4; Sodium 137  Recent Lipid Panel    Component Value Date/Time   CHOL 158 11/17/2021 1155   CHOL 199 05/21/2020 1024   TRIG 178.0 (H) 11/17/2021 1155   HDL 55.40 11/17/2021 1155   HDL 78 05/21/2020 1024   CHOLHDL 3 11/17/2021 1155   VLDL 35.6 11/17/2021 1155   LDLCALC 67 11/17/2021 1155   LDLCALC 92 05/21/2020 1024   LDLDIRECT 133.8 12/19/2012 0828     Risk Assessment/Calculations:    CHA2DS2-VASc Score = 5   This indicates a 7.2% annual risk of stroke. The patient's score is based upon: CHF History: 0 HTN History: 1 Diabetes History: 0 Stroke History: 0 Vascular Disease History: 1 Age Score: 2 Gender Score: 1        Physical Exam:    VS:  BP (!) 148/83  Pulse 94   Ht 5' 1"$  (1.549 m)   Wt 122 lb (55.3 kg)   SpO2 97%   BMI 23.05 kg/m     Wt Readings from Last 3 Encounters:  06/14/22 122 lb (55.3 kg)  05/10/22 121 lb (54.9 kg)  03/29/22 122 lb 6 oz (55.5 kg)     GEN:  Well nourished, well developed in no acute distress HEENT:  Normal NECK: No JVD; No carotid bruits CARDIAC: RRR, 3/6 harsh systolic murmur RESPIRATORY:  Clear to auscultation without rales, wheezing or rhonchi  ABDOMEN: Soft, non-tender, non-distended MUSCULOSKELETAL:  No edema; No deformity  SKIN: Warm and dry NEUROLOGIC:  Alert and oriented x 3 PSYCHIATRIC:  Normal affect   ASSESSMENT:    1. S/P TAVR (transcatheter aortic valve replacement)   2. Severe aortic stenosis   3. Paroxysmal atrial fibrillation (HCC)   4. Mitral valve insufficiency, unspecified etiology   5. HOCM (hypertrophic obstructive cardiomyopathy) ([place])   6. Essential hypertension    PLAN:    In order of problems listed above:  #Severe AS s//p TAVR: S/p successful TAVR with a Medtronic Evolut FX 72m via the TF approach on 09/08/21. There was significant leak after valve deployment therefore valve was post dilated with improvement. TTE 09/2021 with LVEF 55-60%, mean gradient 178mg, peak gradient 2378m, mild perivalvular AI. Currently, doing very well without anginal symptoms.  -Continue serial echoes on 09/2022 -Not on ASA due to need for apixaban -Continue metoprolol 97m2m daily -Continue dental ppx  #Paroxysmal Afib: CHADs-vasc 5. Currently doing well with no palpitations. -Conitnue apixban 5mg 51m -Continue metoprolol 97mg 66maily  #HOCM with Subvalvular Stenosis: LVEF 55-60% with peak gradient through LVOT 47mmHg56mM of the anterior mitral leaflet. -Continue metop 97mg XL64mly  #Moderate to Severe MR: -Continue serial monitoring with repeat echoes -Euvolemic and compensated on exam           Medication Adjustments/Labs and Tests Ordered: Current medicines are reviewed at length with the patient today.  Concerns regarding medicines are outlined above.  Orders Placed This Encounter  Procedures   Lipid Profile   CBC w/Diff   Meds ordered this encounter  Medications   ELIQUIS 5 MG TABS tablet    Sig: Take 1 tablet (5 mg total) by mouth 2 (two)  times daily.    Dispense:  180 tablet    Refill:  2    Patient Instructions  Medication Instructions:   Your physician recommends that you continue on your current medications as directed. Please refer to the Current Medication list given to you today.  *If you need a refill on your cardiac medications before your next appointment, please call your pharmacy*   Lab Work:  TODAY--LIPIDS AND CBC W DIFF  If you have labs (blood work) drawn today and your tests are completely normal, you will receive your results only by: MyChart [place] have MyChart) OR A paper copy in the mail If you have any lab test that is abnormal or we need to change your treatment, we will call you to review the results.    Follow-Up: [facility] your [facility] needs are our priority.  As part of our continuing mission to provide you with exceptional heart care, we have created designated Provider Care Teams.  These Care Teams include your primary Cardiologist (physician) and Advanced Practice Providers (APPs -  Physician Assistants and Nurse Practitioners) who all work together to provide you with the care you need,  when you need it.  We recommend signing up for the patient portal called "MyChart".  Sign up information is provided on this After Visit Summary.  MyChart is used to connect with patients for Virtual Visits (Telemedicine).  Patients are able to view lab/test results, encounter notes, upcoming appointments, etc.  Non-urgent messages can be sent to your provider as well.   To learn more about what you can do with MyChart, go to [email or web address].    Your next appointment:   6 month(s)  Provider:   Freada Bergeron, MD        Signed, Freada Bergeron, MD  06/14/2022 10:50 AM    [place]

## 2022-06-14 ENCOUNTER — Ambulatory Visit: Payer: Medicare HMO | Attending: Cardiology | Admitting: Cardiology

## 2022-06-14 ENCOUNTER — Encounter: Payer: Self-pay | Admitting: Cardiology

## 2022-06-14 VITALS — BP 148/83 | HR 94 | Ht 61.0 in | Wt 122.0 lb

## 2022-06-14 DIAGNOSIS — I10 Essential (primary) hypertension: Secondary | ICD-10-CM

## 2022-06-14 DIAGNOSIS — Z952 Presence of prosthetic heart valve: Secondary | ICD-10-CM | POA: Diagnosis not present

## 2022-06-14 DIAGNOSIS — I340 Nonrheumatic mitral (valve) insufficiency: Secondary | ICD-10-CM

## 2022-06-14 DIAGNOSIS — I480 Paroxysmal atrial fibrillation: Secondary | ICD-10-CM | POA: Diagnosis not present

## 2022-06-14 DIAGNOSIS — I421 Obstructive hypertrophic cardiomyopathy: Secondary | ICD-10-CM | POA: Diagnosis not present

## 2022-06-14 DIAGNOSIS — I350 Nonrheumatic aortic (valve) stenosis: Secondary | ICD-10-CM | POA: Diagnosis not present

## 2022-06-14 LAB — CBC WITH DIFFERENTIAL/PLATELET
Basophils Absolute: 0.1 10*3/uL (ref 0.0–0.2)
Basos: 1 %
EOS (ABSOLUTE): 0.1 10*3/uL (ref 0.0–0.4)
Eos: 1 %
Hematocrit: 40.4 % (ref 34.0–46.6)
Hemoglobin: 13.6 g/dL (ref 11.1–15.9)
Immature Grans (Abs): 0 10*3/uL (ref 0.0–0.1)
Immature Granulocytes: 0 %
Lymphocytes Absolute: 2.6 10*3/uL (ref 0.7–3.1)
Lymphs: 31 %
MCH: 30.7 pg (ref 26.6–33.0)
MCHC: 33.7 g/dL (ref 31.5–35.7)
MCV: 91 fL (ref 79–97)
Monocytes Absolute: 0.9 10*3/uL (ref 0.1–0.9)
Monocytes: 10 %
Neutrophils Absolute: 4.9 10*3/uL (ref 1.4–7.0)
Neutrophils: 57 %
Platelets: 307 10*3/uL (ref 150–450)
RBC: 4.43 x10E6/uL (ref 3.77–5.28)
RDW: 12.1 % (ref 11.7–15.4)
WBC: 8.6 10*3/uL (ref 3.4–10.8)

## 2022-06-14 LAB — LIPID PANEL
Chol/HDL Ratio: 2.2 ratio (ref 0.0–4.4)
Cholesterol, Total: 192 mg/dL (ref 100–199)
HDL: 86 mg/dL (ref >39)
LDL Chol Calc (NIH): 85 mg/dL (ref 0–99)
Triglycerides: 120 mg/dL (ref 0–149)
VLDL Cholesterol Cal: 21 mg/dL (ref 5–40)

## 2022-06-14 MED ORDER — ELIQUIS 5 MG PO TABS: 5.0000 mg | ORAL_TABLET | Freq: Two times a day (BID) | ORAL | 2 refills | Status: DC

## 2022-06-14 NOTE — Patient Instructions (Signed)
Medication Instructions:   Your physician recommends that you continue on your current medications as directed. Please refer to the Current Medication list given to you today.  *If you need a refill on your cardiac medications before your next appointment, please call your pharmacy*   Lab Work:  TODAY--LIPIDS AND CBC W DIFF  If you have labs (blood work) drawn today and your tests are completely normal, you will receive your results only by: [place] (if you have MyChart) OR A paper copy in the mail If you have any lab test that is abnormal or we need to change your treatment, we will call you to review the results.    Follow-Up: At [facility], you and your health needs are our priority.  As part of our continuing mission to provide you with exceptional heart care, we have created designated Provider Care Teams.  These Care Teams include your primary Cardiologist (physician) and Advanced Practice Providers (APPs -  Physician Assistants and Nurse Practitioners) who all work together to provide you with the care you need, when you need it.  We recommend signing up for the patient portal called "MyChart".  Sign up information is provided on this After Visit Summary.  MyChart is used to connect with patients for Virtual Visits (Telemedicine).  Patients are able to view lab/test results, encounter notes, upcoming appointments, etc.  Non-urgent messages can be sent to your provider as well.   To learn more about what you can do with MyChart, go to [email or web address].    Your next appointment:   6 month(s)  Provider:   Freada Bergeron, MD

## 2022-06-16 ENCOUNTER — Telehealth: Payer: Self-pay | Admitting: *Deleted

## 2022-06-16 DIAGNOSIS — E785 Hyperlipidemia, unspecified: Secondary | ICD-10-CM

## 2022-06-16 DIAGNOSIS — Z79899 Other long term (current) drug therapy: Secondary | ICD-10-CM

## 2022-06-16 DIAGNOSIS — I2510 Atherosclerotic heart disease of native coronary artery without angina pectoris: Secondary | ICD-10-CM

## 2022-06-16 MED ORDER — EZETIMIBE 10 MG PO TABS: 10.0000 mg | ORAL_TABLET | Freq: Every day | ORAL | 2 refills | Status: DC

## 2022-06-16 NOTE — Telephone Encounter (Signed)
-----   Message from Freada Bergeron, MD sent at 06/16/2022 11:39 AM EST ----- Blood counts look great. LDL cholesterol slightly above goal (goal <70). Is she willing to start zetia 78m daily or does she wish to continue with lifestyle modifications? Either way, we can repeat lipids in 3 months for monitoring to ensure she is at goal.

## 2022-06-16 NOTE — Telephone Encounter (Signed)
The patient has been notified of the result and verbalized understanding.  All questions (if any) were answered.  Pt states she is willing to start taking zetia 10 mg po daily and come in for repeat lipids in 3 months to reassess.   Confirmed the pharmacy of choice with the pt.  Scheduled the pt for repeat lipids in 3 months on 09/27/22.  She is aware to come fasting to this lab appt.   Pt verbalized understanding and agrees with this plan.

## 2022-07-23 ENCOUNTER — Other Ambulatory Visit: Payer: Self-pay

## 2022-07-23 MED ORDER — ROSUVASTATIN CALCIUM 20 MG PO TABS: 20.0000 mg | ORAL_TABLET | Freq: Every day | ORAL | 0 refills | Status: DC

## 2022-07-24 ENCOUNTER — Other Ambulatory Visit: Payer: Self-pay | Admitting: Internal Medicine

## 2022-07-24 DIAGNOSIS — E876 Hypokalemia: Secondary | ICD-10-CM

## 2022-07-24 DIAGNOSIS — I10 Essential (primary) hypertension: Secondary | ICD-10-CM

## 2022-08-30 ENCOUNTER — Encounter: Payer: Self-pay | Admitting: Podiatry

## 2022-08-30 ENCOUNTER — Ambulatory Visit (INDEPENDENT_AMBULATORY_CARE_PROVIDER_SITE_OTHER): Payer: Medicare HMO

## 2022-08-30 ENCOUNTER — Ambulatory Visit: Payer: Medicare HMO | Admitting: Podiatry

## 2022-08-30 DIAGNOSIS — M7752 Other enthesopathy of left foot: Secondary | ICD-10-CM | POA: Diagnosis not present

## 2022-08-30 DIAGNOSIS — L84 Corns and callosities: Secondary | ICD-10-CM

## 2022-08-30 DIAGNOSIS — R52 Pain, unspecified: Secondary | ICD-10-CM | POA: Diagnosis not present

## 2022-08-30 DIAGNOSIS — D689 Coagulation defect, unspecified: Secondary | ICD-10-CM | POA: Diagnosis not present

## 2022-08-30 MED ORDER — TRIAMCINOLONE ACETONIDE 10 MG/ML IJ SUSP
10.0000 mg | Freq: Once | INTRAMUSCULAR | Status: AC
  Administered 2022-08-30: 10 mg

## 2022-08-30 NOTE — Progress Notes (Signed)
Patient presents subjective:   Patient ID: Carrie [name], female   DOB: 77 y.o.   MRN: [id number]   HPI With several months of inflammation pain around the big toe joint left states that she always has had a bunion but she has been able to accommodate it but recently it has been getting worse and harder for her to wear shoe gear comfortably.  Patient does not smoke likes to be active if possible   Review of Systems  All other systems reviewed and are negative.       Objective:  Physical Exam Vitals and nursing note reviewed.  Constitutional:      Appearance: She is well-developed.  Pulmonary:     Effort: Pulmonary effort is normal.  Musculoskeletal:        General: Normal range of motion.  Skin:    General: Skin is warm.  Neurological:     Mental Status: She is alert.     Neurovascular status was found to be intact muscle strength found to be adequate range of motion adequate with patient noted to have inflammation fluid around the first MP joint left with also lesion formation present.  States it is very tender makes it hard to wear shoe gear or take any compression against the side.  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  Patient tolerated that well no bleeding was noted dressing applied reappoint as symptoms indicate and hopefully will not require bunion correction  X-rays indicate significant structural deformity of the first metatarsal left foot  with arthritis of the joint also noted

## 2022-09-02 ENCOUNTER — Other Ambulatory Visit: Payer: Self-pay | Admitting: Cardiovascular Disease

## 2022-09-02 DIAGNOSIS — I2510 Atherosclerotic heart disease of native coronary artery without angina pectoris: Secondary | ICD-10-CM

## 2022-09-09 ENCOUNTER — Ambulatory Visit (INDEPENDENT_AMBULATORY_CARE_PROVIDER_SITE_OTHER): Payer: Medicare HMO | Admitting: *Deleted

## 2022-09-09 ENCOUNTER — Telehealth: Payer: Self-pay | Admitting: *Deleted

## 2022-09-09 ENCOUNTER — Ambulatory Visit: Payer: Medicare HMO

## 2022-09-09 DIAGNOSIS — M810 Age-related osteoporosis without current pathological fracture: Secondary | ICD-10-CM | POA: Diagnosis not present

## 2022-09-09 MED ORDER — DENOSUMAB 60 MG/ML ~~LOC~~ SOSY
60.0000 mg | PREFILLED_SYRINGE | Freq: Once | SUBCUTANEOUS | Status: AC
  Administered 2022-09-09: 60 mg via SUBCUTANEOUS

## 2022-09-09 NOTE — Telephone Encounter (Signed)
Pt came in to get her Prolia injection today. She is due back in November for the other one. Pls verify coverage../l,mb

## 2022-09-09 NOTE — Progress Notes (Signed)
Pls cosign for Prolia inj../lmb  

## 2022-09-27 ENCOUNTER — Other Ambulatory Visit: Payer: Medicare HMO

## 2022-10-04 ENCOUNTER — Other Ambulatory Visit: Payer: Medicare HMO

## 2022-10-07 ENCOUNTER — Ambulatory Visit: Payer: Medicare HMO | Attending: Cardiology

## 2022-10-07 DIAGNOSIS — Z79899 Other long term (current) drug therapy: Secondary | ICD-10-CM | POA: Diagnosis not present

## 2022-10-07 DIAGNOSIS — I2510 Atherosclerotic heart disease of native coronary artery without angina pectoris: Secondary | ICD-10-CM | POA: Diagnosis not present

## 2022-10-07 DIAGNOSIS — E785 Hyperlipidemia, unspecified: Secondary | ICD-10-CM

## 2022-10-08 ENCOUNTER — Ambulatory Visit: Payer: Medicare HMO

## 2022-10-08 LAB — LIPID PANEL
Chol/HDL Ratio: 2.1 ratio (ref 0.0–4.4)
Cholesterol, Total: 151 mg/dL (ref 100–199)
HDL: 72 mg/dL (ref >39)
LDL Chol Calc (NIH): 57 mg/dL (ref 0–99)
Triglycerides: 129 mg/dL (ref 0–149)
VLDL Cholesterol Cal: 22 mg/dL (ref 5–40)

## 2022-10-11 ENCOUNTER — Telehealth: Payer: Self-pay | Admitting: Physician Assistant

## 2022-10-11 NOTE — Telephone Encounter (Signed)
  [facility]   MULTIDISCIPLINARY HEART VALVE TEAM  Patient's apts were rescheduled and due to echo availability and patient's reliance on rides to [place] we were not able to get her echo and OV rescheduled within the registry window. She will be seen back in the office on 7/19 for an echo and OV. I spoke to her on the phone and was able to complete a KCCQ. She has NYHA class I symptoms and able to walk a mile with no issues.   [place] Cardiomyopathy Questionnaire     10/11/2022    4:24 PM 10/19/2021   11:04 AM 06/22/2021    4:31 PM  KCCQ-12  1 a. Ability to shower/bathe Not at all limited Not at all limited Not at all limited  1 b. Ability to walk 1 block Not at all limited Not at all limited Not at all limited  1 c. Ability to hurry/jog Other, Did not do Not at all limited Slightly limited  2. Edema feet/ankles/legs Never over the past 2 weeks Never over the past 2 weeks Never over the past 2 weeks  3. Limited by fatigue Less than once a week Never over the past 2 weeks At least once a day  4. Limited by dyspnea Less than once a week Never over the past 2 weeks Never over the past 2 weeks  5. Sitting up / on 3+ pillows Never over the past 2 weeks Never over the past 2 weeks Never over the past 2 weeks  6. Limited enjoyment of life Not limited at all Not limited at all Slightly limited  7. Rest of life w/ symptoms Completely satisfied Completely satisfied Not at all satisfied  8 a. Participation in hobbies Did not limit at all Did not limit at all Slightly limited  8 b. Participation in chores Did not limit at all Did not limit at all Slightly limited  8 c. Visiting family/friends Did not limit at all Did not limit at all Did not limit at all        Carlean Jews PA-C  [facility]

## 2022-10-13 ENCOUNTER — Telehealth: Payer: Self-pay

## 2022-10-13 NOTE — Telephone Encounter (Signed)
Prolia VOB initiated via [email or web address] :  Last Prolia inj: 09/09/22 Next Prolia inj DUE: 03/09/23

## 2022-10-14 ENCOUNTER — Ambulatory Visit (HOSPITAL_COMMUNITY): Payer: Medicare HMO

## 2022-10-15 ENCOUNTER — Other Ambulatory Visit: Payer: Self-pay

## 2022-10-15 MED ORDER — ROSUVASTATIN CALCIUM 20 MG PO TABS: 20.0000 mg | ORAL_TABLET | Freq: Every day | ORAL | 2 refills | Status: DC

## 2022-10-18 ENCOUNTER — Other Ambulatory Visit (HOSPITAL_COMMUNITY): Payer: Medicare HMO

## 2022-10-18 ENCOUNTER — Ambulatory Visit: Payer: Medicare HMO

## 2022-10-20 ENCOUNTER — Other Ambulatory Visit (HOSPITAL_COMMUNITY): Payer: Medicare HMO

## 2022-10-20 ENCOUNTER — Ambulatory Visit: Payer: Medicare HMO

## 2022-10-21 ENCOUNTER — Ambulatory Visit (INDEPENDENT_AMBULATORY_CARE_PROVIDER_SITE_OTHER): Payer: Medicare HMO

## 2022-10-21 VITALS — Ht 61.0 in | Wt 122.0 lb

## 2022-10-21 DIAGNOSIS — Z0000 Encounter for general adult medical examination without abnormal findings: Secondary | ICD-10-CM | POA: Diagnosis not present

## 2022-10-21 NOTE — Patient Instructions (Addendum)
[name] , Thank you for taking time to come for your Medicare Wellness Visit. I appreciate your ongoing commitment to your health goals. Please review the following plan we discussed and let me know if I can assist you in the future.   These are the goals we discussed:  Goals       Increase physical activity and start to play piano again      Look into joining [employer], do chair exercises by watching the program on TV, and start to play my son's piano for fun.      Keep being independent and active.      Manage My Medicine      Timeframe:  Long-Range Goal Priority:  Medium Start Date:    11/04/20                         Expected End Date:    11/04/21                   Follow Up Date Jan 2023   - call for medicine refill 2 or 3 days before it runs out - call if I am sick and can't take my medicine - keep a list of all the medicines I take; vitamins and herbals too  - Get Shingrix vaccine and Covid booster at local pharmacy (not available in office)   Why is this important?   These steps will help you keep on track with your medicines.   Notes:       patient (pt-stated)      Wants to stay healthy; keep working some; Keep friends and may travel some;       Stay Independant (pt-stated)        This is a list of the screening recommended for you and due dates:  Health Maintenance  Topic Date Due   COVID-19 Vaccine (4 - 2023-24 season) 11/06/2022*   Zoster (Shingles) Vaccine (2 of 2) 01/21/2023*   Flu Shot  11/25/2022   Medicare Annual Wellness Visit  10/21/2023   Colon Cancer Screening  05/10/2025   DTaP/Tdap/Td vaccine (3 - Td or Tdap) 03/22/2032   Pneumonia Vaccine  Completed   DEXA scan (bone density measurement)  Completed   Hepatitis C Screening  Completed   HPV Vaccine  Aged Out  *Topic was postponed. The date shown is not the original due date.    Advanced directives: Please bring a copy of your health care power of attorney and living will to the office to  be added to your chart at your convenience.   Conditions/risks identified: None  Next appointment: Follow up in one year for your annual wellness visit     Preventive Care 65 Years and Older, Female Preventive care refers to lifestyle choices and visits with your health care provider that can promote health and wellness. What does preventive care include? A yearly physical exam. This is also called an annual well check. Dental exams once or twice a year. Routine eye exams. Ask your health care provider how often you should have your eyes checked. Personal lifestyle choices, including: Daily care of your teeth and gums. Regular physical activity. Eating a healthy diet. Avoiding tobacco and drug use. Limiting alcohol use. Practicing safe sex. Taking low-dose aspirin every day. Taking vitamin and mineral supplements as recommended by your health care provider. What happens during an annual well check? The services and screenings done by your health care provider during  your annual well check will depend on your age, overall health, lifestyle risk factors, and family history of disease. Counseling  Your health care provider may ask you questions about your: Alcohol use. Tobacco use. Drug use. Emotional well-being. Home and relationship well-being. Sexual activity. Eating habits. History of falls. Memory and ability to understand (cognition). Work and work Astronomer. Reproductive health. Screening  You may have the following tests or measurements: Height, weight, and BMI. Blood pressure. Lipid and cholesterol levels. These may be checked every 5 years, or more frequently if you are over 15 years old. Skin check. Lung cancer screening. You may have this screening every year starting at age 40 if you have a 30-pack-year history of smoking and currently smoke or have quit within the past 15 years. Fecal occult blood test (FOBT) of the stool. You may have this test every year  starting at age 67. Flexible sigmoidoscopy or colonoscopy. You may have a sigmoidoscopy every 5 years or a colonoscopy every 10 years starting at age 50. Hepatitis C blood test. Hepatitis B blood test. Sexually transmitted disease (STD) testing. Diabetes screening. This is done by checking your blood sugar (glucose) after you have not eaten for a while (fasting). You may have this done every 1-3 years. Bone density scan. This is done to screen for osteoporosis. You may have this done starting at age 73. Mammogram. This may be done every 1-2 years. Talk to your health care provider about how often you should have regular mammograms. Talk with your health care provider about your test results, treatment options, and if necessary, the need for more tests. Vaccines  Your health care provider may recommend certain vaccines, such as: Influenza vaccine. This is recommended every year. Tetanus, diphtheria, and acellular pertussis (Tdap, Td) vaccine. You may need a Td booster every 10 years. Zoster vaccine. You may need this after age 67. Pneumococcal 13-valent conjugate (PCV13) vaccine. One dose is recommended after age 31. Pneumococcal polysaccharide (PPSV23) vaccine. One dose is recommended after age 34. Talk to your health care provider about which screenings and vaccines you need and how often you need them. This information is not intended to replace advice given to you by your health care provider. Make sure you discuss any questions you have with your health care provider. Document Released: 05/09/2015 Document Revised: 12/31/2015 Document Reviewed: 02/11/2015 Elsevier Interactive Patient Education  2017 [employer].  Fall Prevention in the Home Falls can cause injuries. They can happen to people of all ages. There are many things you can do to make your home safe and to help prevent falls. What can I do on the outside of my home? Regularly fix the edges of walkways and driveways and fix any  cracks. Remove anything that might make you trip as you walk through a door, such as a raised step or threshold. Trim any bushes or trees on the path to your home. Use bright outdoor lighting. Clear any walking paths of anything that might make someone trip, such as rocks or tools. Regularly check to see if handrails are loose or broken. Make sure that both sides of any steps have handrails. Any raised decks and porches should have guardrails on the edges. Have any leaves, snow, or ice cleared regularly. Use sand or salt on walking paths during winter. Clean up any spills in your garage right away. This includes oil or grease spills. What can I do in the bathroom? Use night lights. Install grab bars by the toilet and  in the tub and shower. Do not use towel bars as grab bars. Use non-skid mats or decals in the tub or shower. If you need to sit down in the shower, use a plastic, non-slip stool. Keep the floor dry. Clean up any water that spills on the floor as soon as it happens. Remove soap buildup in the tub or shower regularly. Attach bath mats securely with double-sided non-slip rug tape. Do not have throw rugs and other things on the floor that can make you trip. What can I do in the bedroom? Use night lights. Make sure that you have a light by your bed that is easy to reach. Do not use any sheets or blankets that are too big for your bed. They should not hang down onto the floor. Have a firm chair that has side arms. You can use this for support while you get dressed. Do not have throw rugs and other things on the floor that can make you trip. What can I do in the kitchen? Clean up any spills right away. Avoid walking on wet floors. Keep items that you use a lot in easy-to-reach places. If you need to reach something above you, use a strong step stool that has a grab bar. Keep electrical cords out of the way. Do not use floor polish or wax that makes floors slippery. If you must  use wax, use non-skid floor wax. Do not have throw rugs and other things on the floor that can make you trip. What can I do with my stairs? Do not leave any items on the stairs. Make sure that there are handrails on both sides of the stairs and use them. Fix handrails that are broken or loose. Make sure that handrails are as long as the stairways. Check any carpeting to make sure that it is firmly attached to the stairs. Fix any carpet that is loose or worn. Avoid having throw rugs at the top or bottom of the stairs. If you do have throw rugs, attach them to the floor with carpet tape. Make sure that you have a light switch at the top of the stairs and the bottom of the stairs. If you do not have them, ask someone to add them for you. What else can I do to help prevent falls? Wear shoes that: Do not have high heels. Have rubber bottoms. Are comfortable and fit you well. Are closed at the toe. Do not wear sandals. If you use a stepladder: Make sure that it is fully opened. Do not climb a closed stepladder. Make sure that both sides of the stepladder are locked into place. Ask someone to hold it for you, if possible. Clearly mark and make sure that you can see: Any grab bars or handrails. First and last steps. Where the edge of each step is. Use tools that help you move around (mobility aids) if they are needed. These include: Canes. Walkers. Scooters. Crutches. Turn on the lights when you go into a dark area. Replace any light bulbs as soon as they burn out. Set up your furniture so you have a clear path. Avoid moving your furniture around. If any of your floors are uneven, fix them. If there are any pets around you, be aware of where they are. Review your medicines with your doctor. Some medicines can make you feel dizzy. This can increase your chance of falling. Ask your doctor what other things that you can do to help prevent falls. This information  is not intended to replace  advice given to you by your health care provider. Make sure you discuss any questions you have with your health care provider. Document Released: 02/06/2009 Document Revised: 09/18/2015 Document Reviewed: 05/17/2014 Elsevier Interactive Patient Education  2017 [employer].

## 2022-10-21 NOTE — Progress Notes (Signed)
Subjective:   Carrie [name] is a 77 y.o. female who presents for Medicare Annual (Subsequent) preventive examination.  Visit Complete: Virtual  I connected with  [name] on 10/21/22 by a audio enabled telemedicine application and verified that I am speaking with the correct person using two identifiers.  Patient Location: Home  Provider Location: Home Office  I discussed the limitations of evaluation and management by telemedicine. The patient expressed understanding and agreed to proceed.  Patient Medicare AWV questionnaire was completed by the patient on ; I have confirmed that all information answered by patient is correct and no changes since this date.  Review of Systems     Cardiac Risk Factors include: advanced age (>70men, >92 women);hypertension     Objective:    Today's Vitals   10/21/22 1336  Weight: 122 lb (55.3 kg)  Height: 5\' 1"  (1.549 m)   Body mass index is 23.05 kg/m.     10/21/2022    1:44 PM 11/09/2021    9:36 AM 09/08/2021   10:25 AM 09/04/2021    2:10 PM 04/22/2021    7:38 AM 11/06/2020   12:46 PM 04/27/2017    7:38 PM  Advanced Directives  Does Patient Have a Medical Advance Directive? Yes Yes Yes Yes Yes Yes No  Type of Estate agent of [place];Living will Living will;Healthcare Power of [employer] Power of [place];Living will Living will;Healthcare Power of [employer] Power of [place];Living will Living will;Healthcare Power of Attorney   Does patient want to make changes to medical advance directive?  No - Patient declined No - Patient declined  No - Patient declined No - Patient declined   Copy of Healthcare Power of Attorney in Chart? No - copy requested No - copy requested No - copy requested  No - copy requested No - copy requested     Current Medications (verified) Outpatient Encounter Medications as of 10/21/2022  Medication Sig   ezetimibe (ZETIA) 10 MG tablet Take 1 tablet  (10 mg total) by mouth daily.   Cholecalciferol (VITAMIN D3) 1000 UNITS CAPS Take 1 capsule by mouth daily.   denosumab (PROLIA) 60 MG/ML SOSY injection Inject 60 mg into the skin every 6 (six) months.   ELIQUIS 5 MG TABS tablet Take 1 tablet (5 mg total) by mouth 2 (two) times daily.   KLOR-CON M10 10 MEQ tablet TAKE 1 TABLET BY MOUTH TWICE A DAY   metoprolol succinate (TOPROL-XL) 50 MG 24 hr tablet TAKE 1 TABLET BY MOUTH EVERY DAY WITH OR IMMEDIATELY FOLLOWING A MEAL   rosuvastatin (CRESTOR) 20 MG tablet Take 1 tablet (20 mg total) by mouth daily.   vitamin B-12 (CYANOCOBALAMIN) 1000 MCG tablet Take 1,000 mcg by mouth daily.   No facility-administered encounter medications on file as of 10/21/2022.    Allergies (verified) Codeine, Crestor [rosuvastatin], Latex, and Tape   History: Past Medical History:  Diagnosis Date   Coronary artery disease    a. Prior LHC (07/24/03): LMCA heavily calcified without significant stenosis, LAD with heavy calcification proximally. 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 Excell Seltzer & Dr. Laneta Simmers   Severe aortic stenosis    Wears hearing aid    left   Past Surgical History:  Procedure Laterality Date   ATRIAL ABLATION SURGERY  04/27/2003   BREAST SURGERY  04/26/1980   Benign tumor removed RT   BUNIONECTOMY  04/07/2012   Procedure: BUNIONECTOMY;  Surgeon: Harvie Junior, MD;  Location: [facility];  Service: Orthopedics;  Laterality: Left;  CHEVRON OSTEOTOMY LEFT FOOT    CARDIAC CATHETERIZATION  04/27/2003   EYE SURGERY     bilateral cataracts   GANGLION CYST EXCISION     left wrist   HERNIA REPAIR  04/26/1988   rt ing   INTRAOPERATIVE TRANSTHORACIC ECHOCARDIOGRAM N/A 09/08/2021   Procedure: INTRAOPERATIVE TRANSTHORACIC ECHOCARDIOGRAM;  Surgeon: Tonny Bollman, MD;  Location: [facility] INVASIVE CV LAB;  Service: Open Heart Surgery;  Laterality: N/A;   RIGHT/LEFT HEART CATH AND CORONARY ANGIOGRAPHY N/A 04/22/2021   Procedure: RIGHT/LEFT HEART CATH AND CORONARY ANGIOGRAPHY;  Surgeon: Tonny Bollman, MD;  Location: [facility] INVASIVE CV LAB;  Service: Cardiovascular;  Laterality: N/A;   TONSILLECTOMY     TRANSCATHETER AORTIC VALVE REPLACEMENT, TRANSFEMORAL N/A 09/08/2021   Procedure: Transcatheter Aortic Valve Replacement, Transfemoral;  Surgeon: Tonny Bollman, MD;  Location: [facility] INVASIVE CV LAB;  Service: Open Heart Surgery;  Laterality: N/A;   TUBAL LIGATION     Family History  Problem Relation Age of Onset   Heart disease Mother 38   Congestive Heart Failure Mother    Dementia Mother    Hypertension Other    Valvular heart disease Father        Had valves replaced   Colon cancer Neg Hx    Rectal cancer Neg Hx    Social History   Socioeconomic History   Marital status: Divorced    Spouse name: Not on file   Number of children: 2   Years of education: Not on file   Highest education level: Not on file  Occupational History   Occupation: retired  Tobacco Use   Smoking status: Former    Types: Cigarettes    Quit date: 04/05/1999    Years since quitting: 23.5   Smokeless tobacco: Never   Tobacco comments:    Regular Exercise-No  Vaping Use   Vaping Use: Never used  Substance and Sexual Activity   Alcohol use: Yes    Alcohol/week: 7.0 standard drinks of alcohol    Types: 7 Glasses of wine per week    Comment: 1 drink per week   Drug use: No   Sexual activity: Not  Currently    Birth control/protection: Post-menopausal  Other Topics Concern   Not on file  Social History Narrative   Not on file   Social Determinants of Health   Financial Resource Strain: Low Risk  (10/21/2022)   Overall Financial Resource Strain (CARDIA)    Difficulty of Paying Living Expenses: Not hard at all  Food Insecurity: No Food Insecurity (11/09/2021)   Hunger Vital Sign    Worried About Running Out of Food in the Last Year: Never true    Ran Out of Food in the Last Year: Never true  Transportation Needs: No Transportation Needs (11/09/2021)   PRAPARE - Administrator, Civil Service (Medical): No    Lack of Transportation (Non-Medical): No  Physical  Activity: Sufficiently Active (10/21/2022)   Exercise Vital Sign    Days of Exercise per Week: 7 days    Minutes of Exercise per Session: 60 min  Stress: No Stress Concern Present (10/21/2022)   [employer] of Occupational Health - Occupational Stress Questionnaire    Feeling of Stress : Not at all  Social Connections: Moderately Integrated (10/21/2022)   Social Connection and Isolation Panel [NHANES]    Frequency of Communication with Friends and Family: More than three times a week    Frequency of Social Gatherings with Friends and Family: More than three times a week    Attends Religious Services: More than 4 times per year    Active Member of [employer] or Organizations: Yes    Attends Engineer, structural: More than 4 times per year    Marital Status: Divorced    Tobacco Counseling Counseling given: Not Answered Tobacco comments: Regular Exercise-No   Clinical Intake:  Pre-visit preparation completed: No  Pain : No/denies pain     BMI - recorded: 23.05 Nutritional Status: BMI of 19-24  Normal Nutritional Risks: None Diabetes: No  How often do you need to have someone help you when you read instructions, pamphlets, or other written materials from your doctor or pharmacy?: 1 -  Never  Interpreter Needed?: No  Information entered by :: Theresa Mulligan LPN   Activities of Daily Living    10/21/2022    1:42 PM 11/09/2021    9:59 AM  In your present state of health, do you have any difficulty performing the following activities:  Hearing? 1 1  Comment [place]ers hearing aids   Vision? 0 0  Difficulty concentrating or making decisions? 0 0  Walking or climbing stairs? 0 0  Dressing or bathing? 0 0  Doing errands, shopping? 0 0  Preparing Food and eating ? N N  Using the Toilet? N N  In the past six months, have you accidently leaked urine? N N  Do you have problems with loss of bowel control? N N  Managing your Medications? N N  Managing your Finances? N N  Housekeeping or managing your Housekeeping? N N    Patient Care Team: Etta Grandchild, MD as PCP - General (Internal Medicine) Meriam Sprague, MD as PCP - Cardiology (Cardiology) Kathyrn Sheriff, [facility] (Inactive) as Pharmacist (Pharmacist) Tonny Bollman, MD as Consulting Physician (Cardiology) Jethro Bolus, MD as Consulting Physician (Ophthalmology) Arminda Resides, MD as Consulting Physician (Dermatology)  Indicate any recent Medical Services you may have received from other than [facility] providers in the past year (date may be approximate).     Assessment:   This is a routine wellness examination for [place].  Hearing/Vision screen Hearing Screening - Comments:: Wears hearing aids Vision Screening - Comments:: Wears rx glasses - up to date with routine eye exams with  Dr Nile Riggs  Dietary issues and exercise activities discussed:     Goals Addressed               This Visit's Progress     Stay Independant (pt-stated)         Depression Screen    10/21/2022    1:41 PM 03/29/2022   10:28 AM 12/21/2021    2:40 PM 11/17/2021   11:09 AM 11/09/2021    9:14 AM 07/07/2021    3:09 PM 11/06/2020   12:50 PM  PHQ 2/9 Scores  PHQ - 2 Score 0 0 0 0 0 0 0  PHQ- 9  Score  0         Fall  Risk    10/21/2022    1:43 PM 03/29/2022   10:28 AM 12/21/2021    2:41 PM 11/17/2021   11:09 AM 11/09/2021    9:37 AM  Fall Risk   Falls in the past year? 0 0 0 0 0  Number falls in past yr: 0 0 0  0  Injury with Fall? 0 0 0 0 0  Risk for fall due to : No Fall Risks No Fall Risks No Fall Risks No Fall Risks No Fall Risks  Follow up Falls prevention discussed Falls evaluation completed Falls evaluation completed Falls evaluation completed Falls evaluation completed    MEDICARE RISK AT HOME:  Medicare Risk at Home - 10/21/22 1347     Any stairs in or around the home? Yes    If so, are there any without handrails? No    Home free of loose throw rugs in walkways, pet beds, electrical cords, etc? Yes    Adequate lighting in your home to reduce risk of falls? Yes    Life alert? No    Use of a cane, walker or w/c? No    Grab bars in the bathroom? Yes    Shower chair or bench in shower? No    Elevated toilet seat or a handicapped toilet? Yes             TIMED UP AND GO:  Was the test performed?  No    Cognitive Function:    01/19/2017   11:47 AM  MMSE - Mini Mental State Exam  Orientation to time 5  Orientation to Place 5  Registration 3  Attention/ Calculation 5  Recall 2  Language- name 2 objects 2  Language- repeat 1  Language- follow 3 step command 3  Language- read & follow direction 1  Write a sentence 1  Copy design 1  Total score 29        10/21/2022    1:44 PM 11/09/2021    9:16 AM  6CIT Screen  What Year? 0 points 0 points  What month? 0 points 0 points  What time? 0 points 0 points  Count back from 20 0 points 0 points  Months in reverse 0 points 0 points  Repeat phrase 0 points 0 points  Total Score 0 points 0 points    Immunizations Immunization History  Administered Date(s) Administered   Fluad Quad(high Dose 65+) 01/04/2019, 03/08/2022   Influenza Whole 01/06/2010, 01/19/2012   Influenza, High Dose Seasonal PF 01/23/2015, 01/20/2016,  01/19/2017, 01/03/2018   Influenza,inj,Quad PF,6+ Mos 02/08/2014   Influenza-Unspecified 03/15/2021   Moderna Sars-Covid-2 Vaccination 05/29/2019, 06/26/2019, 02/20/2020   Pneumococcal Conjugate-13 04/22/2015   Pneumococcal Polysaccharide-23 06/22/2012   Td 12/25/2009   Tdap 03/22/2022   Zoster Recombinat (Shingrix) 11/09/2021   Zoster, Live 12/25/2009    TDAP status: Up to date  Flu Vaccine status: Up to date  Pneumococcal vaccine status: Up to date  Covid-19 vaccine status: Completed vaccines  Qualifies for Shingles Vaccine? Yes   Zostavax completed No   Shingrix Completed?: No.    Education has been provided regarding the importance of this vaccine. Patient has been advised to call insurance company to determine out of pocket expense if they have not yet received this vaccine. Advised may also receive vaccine at local pharmacy or Health Dept. Verbalized acceptance and understanding.  Screening Tests Health Maintenance  Topic Date Due   COVID-19 Vaccine (  4 - 2023-24 season) 11/06/2022 (Originally 12/25/2021)   Zoster Vaccines- Shingrix (2 of 2) 01/21/2023 (Originally 01/04/2022)   INFLUENZA VACCINE  11/25/2022   Medicare Annual Wellness (AWV)  10/21/2023   Colonoscopy  05/10/2025   DTaP/Tdap/Td (3 - Td or Tdap) 03/22/2032   Pneumonia Vaccine 79+ Years old  Completed   DEXA SCAN  Completed   Hepatitis C Screening  Completed   HPV VACCINES  Aged Out    Health Maintenance  There are no preventive care reminders to display for this patient.   Colorectal cancer screening: No longer required.   Mammogram status: No longer required due to Age.  Bone Density status: Completed 66/22. Results reflect: Bone density results: OSTEOPOROSIS. Repeat every   years.  Lung Cancer Screening: (Low Dose CT Chest recommended if Age 6-80 years, 20 pack-year currently smoking OR have quit w/in 15years.) does not qualify.     Additional Screening:  Hepatitis C Screening: does qualify;  Completed 08/16/16  Vision Screening: Recommended annual ophthalmology exams for early detection of glaucoma and other disorders of the eye. Is the patient up to date with their annual eye exam?  Yes  Who is the provider or what is the name of the office in which the patient attends annual eye exams? Dr Nile Riggs If pt is not established with a provider, would they like to be referred to a provider to establish care? No .   Dental Screening: Recommended annual dental exams for proper oral hygiene    Community Resource Referral / Chronic Care Management:  CRR required this visit?  No   CCM required this visit?  No     Plan:     I have personally reviewed and noted the following in the patient's chart:   Medical and social history Use of alcohol, tobacco or illicit drugs  Current medications and supplements including opioid prescriptions. Patient is not currently taking opioid prescriptions. Functional ability and status Nutritional status Physical activity Advanced directives List of other physicians Hospitalizations, surgeries, and ER visits in previous 12 months Vitals Screenings to include cognitive, depression, and falls Referrals and appointments  In addition, I have reviewed and discussed with patient certain preventive protocols, quality metrics, and best practice recommendations. A written personalized care plan for preventive services as well as general preventive health recommendations were provided to patient.     PADDY WALTHALL, LPN   12/23/5619   After Visit Summary: (MyChart) Due to this being a telephonic visit, the after visit summary with patients personalized plan was offered to patient via MyChart   Nurse Notes: None

## 2022-11-03 ENCOUNTER — Other Ambulatory Visit (HOSPITAL_COMMUNITY): Payer: Medicare HMO

## 2022-11-03 ENCOUNTER — Ambulatory Visit: Payer: Medicare HMO

## 2022-11-08 ENCOUNTER — Other Ambulatory Visit: Payer: Self-pay | Admitting: Internal Medicine

## 2022-11-11 NOTE — Progress Notes (Signed)
[facility]   MULTIDISCIPLINARY HEART VALVE CLINIC                                     Cardiology Office Note:    Date:  11/12/2022   ID:  Carrie [name], DOB 07/02/1945, MRN [id number]  PCP:  Carrie Grandchild, MD  [facility] Cardiologist:  Carrie Sprague, MD / Dr. Excell [name] & Dr. Laneta [name] (TAVR) --> I will see her back for 6 month follow and then establish with Dr. Lorna [name] given HOCM [facility] Electrophysiologist:  None   Referring MD: Carrie Grandchild, MD   1 year s/p TAVR  History of Present Illness:    Carrie [name] is a 77 y.o. female with a hx of moderate nonobstructive CAD, remote SVT ablation, HOCM with LVOT obstruction, paroxysmal atrial fibrillation on Eliquis, severe MAC, mod-severe MR with mild MS and severe AS s/p TAVR who presents to clinic for follow-up.   Per review of the record, patient has a history of cardiac cath in 2005 which showed moderate stenosis of the mid-LAD. She underwent an SVT ablation in 2005. She was seen by Dr. Excell [name] in 02/2021 due to severe AS. Due to the complexity of her cardiac disease however was felt to be a poor candidate for surgery due to extensive calcification in the ascending aorta and aortic arch as well as the need for complex surgery which would involve double valve replacement and septal myectomy. She was felt to be a better candidate for TAVR at that time.    She then started experiencing progressive symptoms of fatigue and upper back pain with physical activity. A repeat echocardiogram at that time confirmed worsening valvular aortic stenosis with an LVEF 60%, severe basal septal hypertrophy with SAM and LVOT gradient of 3.5 m/s with valsalva c/w HOCM. Aov mean grad 49.3 mmHg, peak grad 84.4 mmHg, AVA 0.57 cm2, DVI 0.23 moderate to severe MR with mild MS and severe MAC. Mild AR. BSA: 1.6 m2.    Cardiac catheterization performed 04/22/21 showed calcified but nonobstructive coronary artery disease  with patency of the left main, LAD, left circumflex, ramus intermedius, and right coronary arteries. Severe aortic stenosis with mean transvalvular gradient 27 mmHg and calculated aortic valve area 0.97 cm (probable combination of valvular and subvalvular stenosis). Mild pulmonary hypertension with mean pulmonary artery pressure 28 mmHg, transpulmonary gradient 9 mmHg, V wave 24 mmHg.    She underwent TAVR on 09/08/21 with a Medtronic Evolut FX 26mm via the TF approach. There was significant leak after valve deployment therefore valve was post dilated with improvement. Post operative echo with mean gradient measures at 21.5 mmHg, peak gradient measures 43.68mmHg and AVA by VTI measures 1.68 cm. She was restarted on Eliquis with no issues.   Today the patient presents to clinic for follow up.  Here with her daughter today. No CP or SOB. No LE edema, orthopnea or PND. No dizziness or syncope. No blood in stool or urine. No palpitations. Doesn't have an appetite. Weight has been stable. She denies fatigue but says she is just lazy. She does stay active cleaning. She had been walking around the [place]emetary until it got so hot.    Past Medical History:  Diagnosis Date   Coronary artery disease    a. Prior LHC (07/24/03): LMCA heavily calcified without significant stenosis, LAD with heavy calcification proximally. 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  Excell [name] & Dr. Laneta [name]   Severe aortic stenosis    Wears hearing aid    left    Past Surgical History:  Procedure Laterality Date   ATRIAL ABLATION SURGERY  04/27/2003   BREAST SURGERY  04/26/1980   Benign tumor removed RT   BUNIONECTOMY  04/07/2012   Procedure: BUNIONECTOMY;  Surgeon: Carrie Junior, MD;  Location: [facility];  Service: Orthopedics;  Laterality: Left;  CHEVRON OSTEOTOMY LEFT FOOT    CARDIAC CATHETERIZATION  04/27/2003   EYE SURGERY     bilateral cataracts   GANGLION CYST EXCISION     left wrist   HERNIA REPAIR  04/26/1988   rt ing   INTRAOPERATIVE TRANSTHORACIC ECHOCARDIOGRAM N/A 09/08/2021   Procedure: INTRAOPERATIVE TRANSTHORACIC ECHOCARDIOGRAM;  Surgeon: Carrie Bollman, MD;  Location: [facility] INVASIVE CV LAB;  Service: Open Heart Surgery;  Laterality: N/A;   RIGHT/LEFT HEART CATH AND CORONARY ANGIOGRAPHY N/A 04/22/2021   Procedure: RIGHT/LEFT HEART CATH AND CORONARY ANGIOGRAPHY;  Surgeon: Carrie Bollman, MD;  Location: [facility] INVASIVE CV LAB;  Service: Cardiovascular;  Laterality: N/A;   TONSILLECTOMY     TRANSCATHETER AORTIC VALVE REPLACEMENT, TRANSFEMORAL N/A 09/08/2021   Procedure: Transcatheter Aortic Valve Replacement, Transfemoral;  Surgeon: Carrie Bollman, MD;  Location: [facility] INVASIVE CV LAB;  Service: Open Heart Surgery;  Laterality: N/A;   TUBAL LIGATION      Current Medications: Current Meds  Medication Sig   amoxicillin (AMOXIL) 500 MG tablet Take 4 tablets (2,000 mg total) by mouth as directed. 1 hour prior to dental work including cleanings   Cholecalciferol (VITAMIN D3) 1000 UNITS CAPS Take 1 capsule by mouth daily.   denosumab (PROLIA) 60 MG/ML SOSY injection Inject 60 mg into the skin every 6 (six) months.   ELIQUIS 5 MG TABS tablet Take 1 tablet (5 mg total) by mouth 2 (two) times daily.   ezetimibe (ZETIA) 10 MG tablet Take 1 tablet (10 mg total) by mouth daily.   KLOR-CON M10 10 MEQ tablet TAKE 1 TABLET BY MOUTH TWICE A DAY   metoprolol  succinate (TOPROL-XL) 50 MG 24 hr tablet TAKE 1 TABLET BY MOUTH EVERY DAY WITH OR IMMEDIATELY FOLLOWING A MEAL   rosuvastatin (CRESTOR) 20 MG tablet Take 1 tablet (20 mg total) by mouth daily.   vitamin B-12 (CYANOCOBALAMIN) 1000 MCG tablet Take 1,000 mcg by mouth daily.     Allergies:   Codeine, Crestor [rosuvastatin], Latex, and Tape   Social History   Socioeconomic History   Marital status: Divorced    Spouse name: Not on file   Number of children: 2   Years of education: Not on file   Highest education level: Not on file  Occupational History   Occupation: retired  Tobacco Use   Smoking status: Former    Current packs/day: 0.00    Types: Cigarettes    Quit date: 04/05/1999    Years since quitting: 23.6  Smokeless tobacco: Never   Tobacco comments:    Regular Exercise-No  Vaping Use   Vaping status: Never Used  Substance and Sexual Activity   Alcohol use: Yes    Alcohol/week: 7.0 standard drinks of alcohol    Types: 7 Glasses of wine per week    Comment: 1 drink per week   Drug use: No   Sexual activity: Not Currently    Birth control/protection: Post-menopausal  Other Topics Concern   Not on file  Social History Narrative   Not on file   Social Determinants of Health   Financial Resource Strain: Low Risk  (10/21/2022)   Overall Financial Resource Strain (CARDIA)    Difficulty of Paying Living Expenses: Not hard at all  Food Insecurity: No Food Insecurity (11/09/2021)   Hunger Vital Sign    Worried About Running Out of Food in the Last Year: Never true    Ran Out of Food in the Last Year: Never true  Transportation Needs: No Transportation Needs (11/09/2021)   PRAPARE - Administrator, Civil Service (Medical): No    Lack of Transportation (Non-Medical): No  Physical Activity: Sufficiently Active (10/21/2022)   Exercise Vital Sign    Days of Exercise per Week: 7 days    Minutes of Exercise per Session: 60 min  Stress: No Stress Concern Present  (10/21/2022)   [employer] of Occupational Health - Occupational Stress Questionnaire    Feeling of Stress : Not at all  Social Connections: Moderately Integrated (10/21/2022)   Social Connection and Isolation Panel [NHANES]    Frequency of Communication with Friends and Family: More than three times a week    Frequency of Social Gatherings with Friends and Family: More than three times a week    Attends Religious Services: More than 4 times per year    Active Member of [employer] or Organizations: Yes    Attends Engineer, structural: More than 4 times per year    Marital Status: Divorced     Family History: The patient's family history includes Congestive Heart Failure in her mother; Dementia in her mother; Heart disease (age of onset: 53) in her mother; Hypertension in an other family member; Valvular heart disease in her father. There is no history of Colon cancer or Rectal cancer.  ROS:   Please see the history of present illness.    All other systems reviewed and are negative.  EKGs/Labs/Other Studies Reviewed:    Cardiac Studies & Procedures   CARDIAC CATHETERIZATION  CARDIAC CATHETERIZATION 04/22/2021  Narrative   Mid RCA lesion is 50% stenosed.   Hemodynamic findings consistent with mild pulmonary hypertension.   There is severe aortic valve stenosis.  1.  Calcified but nonobstructive coronary artery disease with patency of the left main, LAD, left circumflex, ramus intermedius, and right coronary arteries 2.  Heavy mitral annular calcification with known moderate to severe mitral regurgitation and mild mitral stenosis by echo assessment 3.  Severe aortic stenosis with mean transvalvular gradient 27 mmHg and calculated aortic valve area 0.97 cm (probable combination of valvular and subvalvular stenosis) 4.  Mild pulmonary hypertension with mean pulmonary artery pressure 28 mmHg, transpulmonary gradient 9 mmHg, V wave 24 mmHg.  Continued evaluation of valvular  heart disease with consideration of cardiac surgery  Findings Coronary Findings Diagnostic  Dominance: Right  Left Main There is mild diffuse disease throughout the vessel. Calcified but widely patent with no significant stenosis  Left Anterior Descending There is mild diffuse  disease throughout the vessel. The vessel is moderately calcified. The LAD is calcified throughout its proximal and mid segments.  The proximal vessel is patent with no stenosis.  The first diagonal is patent with very mild ostial narrowing.  The diagonal is a large caliber vessel.  The mid and distal LAD have mild diffuse irregularities with no significant stenoses  Ramus Intermedius There is mild diffuse disease throughout the vessel. The intermediate branch is patent with mild proximal irregularity and nonobstructive stenosis  Left Circumflex The vessel exhibits minimal luminal irregularities. The circumflex is patent.  The first obtuse marginal branch is patent with minimal irregularity.  Right Coronary Artery There is mild diffuse disease throughout the vessel. Dominant vessel with diffuse calcification.  The proximal vessel is widely patent.  The mid vessel has diffuse irregular 40 to 50% stenosis.  The distal vessel has mild irregularity with no significant stenosis.  The PDA and PLA branches are patent without significant stenoses. Mid RCA lesion is 50% stenosed. The lesion is moderately calcified.  Intervention  No interventions have been documented.   STRESS TESTS  ECHOCARDIOGRAM STRESS TEST 10/20/2020  Narrative EXERCISE STRESS ECHO REPORT   --------------------------------------------------------------------------------  Patient Name:   Carrie [name] Date of Exam: 10/20/2020 Medical Rec #:  [id number]               Height:       61.0 in Accession #:    [id number]              Weight:       129.8 lb Date of Birth:  1946/04/26                BSA:          1.572 m Patient Age:    75 years                 BP:           153/77 mmHg Patient Gender: F                       HR:           79 bpm. Exam Location:  Outpatient  Procedure: Stress Echo  MODIFIED REPORT: This report was modified by Weston Brass MD on 10/20/2020 due to revision. Indications:     Aortic Stenosis, Mitral Regurgitation  History:         Patient has prior history of Echocardiogram examinations, most recent 09/10/2020.  Sonographer:     Irving Burton Senior RDCS Referring Phys:  [phone] MICHAEL Raggio Diagnosing Phys: Weston Brass MD   Sonographer Comments: Designated Party Release on file 12/25/2009 IMPRESSIONS   1. Patient exercised on the treadmill for 6:48 mins and stopped due to leg fatigue. Reading MD present in room for stress test, patient denied chest pain or pressure, DOE, lightheadedness or dizziness and had no presyncope throughout study and at peak stress. No symptoms in recovery. No hypotension during stress test. 2. Baseline echocardiogram demonstrated severe aortic valve stenosis with a mean gradient of 47 mmHg, dynamic LVOT instantaneous gradient of 30 mmHg, severe MR due to SAM, and moderate AI. 3. Post stress images were challenging to obtain after peak stress. Post stress images demonstrate severe AS, dynamic LVOT obstruction with instantaneous gradient of 34 mmHg, severe MR due to SAM, and moderate AI. Trivial to mild TR with RVSP of 32 mmHg and estimated RA pressure of 3 mmHg. 4. Nonspecific ST T wave changes  laterally at baseline. 1-2 mm horizontal ST depressions in V5-V6 that begin early in stress and are most prominent at peak stress. This may be a nonspecific finding given baseline nonspecific ECG changes. 5. This study was not performed to evaluate for ischemia. 6. The findings of the study demonstrate that true severe aortic stenosis is present. 7. This is an indeterminate risk study for ischemia. Low risk hemodyanmic stress test.  Conclusion(s)/Recommendation(s): Study performed with  good exercise capacity and no cardiopulmonary symptoms in setting of severe AS, moderate-severe LVOT obstruction, severe MR, and moderate AI. Unable to demostrate pulmonary hypertension with exercise stress.  FINDINGS  Exam Protocol: The patient exercised on a treadmill according to a Bruce protocol.   Patient Performance: The patient exercised for 6 minutes and 48 seconds, achieving 8.5 METS. The maximum stage achieved was III of the Bruce protocol. The baseline heart rate was 69 bpm. The heart rate at peak stress was 150 bpm. The target heart rate was calculated to be 123 bpm. The percentage of maximum predicted heart rate achieved was 103.8 %. The baseline blood pressure was 112/66 mmHg. The blood pressure at peak stress was 163/83 mmHg. The blood pressure response was normal. The patient developed leg fatigue during the stress exam. The symptoms resolved with rest. The patient's functional capacity was above average.  EKG: Resting EKG showed normal sinus rhythm with nonspecific ST-T wave changes and left ventricular hypertrophy. The patient developed baseline abnormalities during exercise. There was 2.0 mm of horizontal ST segment depression in lead(s) V5 and V6 that resolved in 5 minute(s) into recovery.   2D Echo Findings: The baseline ejection fraction was 70%. Baseline regional wall motion abnormalities were not present. This is an inconclusive stress echocardiogram for ischemia.  Stress Doppler: Aortic Valve: Baseline hemodynamics were: (LVOT VTI 38 cm ), . The findings of the study demonstrate that true severe aortic stenosis is present.  LVOT: The baseline LVOT gradient was 34 mmHG.  Mitral Regurgitation: At baseline, severe mitral regurgitation was present.  Right Ventricular Systolic Pressure: The PASP increased to 32.4 mmHg at peak stress.   Weston Brass MD Electronically signed on 10/20/2020 at 12:07:17 PM      Final (Updated)   ECHOCARDIOGRAM  ECHOCARDIOGRAM  COMPLETE 11/12/2022  Narrative ECHOCARDIOGRAM REPORT    Patient Name:   Carrie [name] Date of Exam: 11/12/2022 Medical Rec #:  [id number]               Height:       61.0 in Accession #:    [id number]              Weight:       122.0 lb Date of Birth:  [birth year]                BSA:          1.531 m Patient Age:    77 years                BP:           148/83 mmHg Patient Gender: F                       HR:           63 bpm. Exam Location:  [address]  Procedure: 2D Echo, Cardiac Doppler and Color Doppler  Indications:    Z95.2 S/p TAVR  History:        Patient has prior history  of Echocardiogram examinations, most recent 10/19/2021. HOCM, S/p TAVR (26mm Medtronic Evolut), Arrythmias:Atrial Fibrillation and SVT; Risk Factors:Hypertension and Dyslipidemia. Aortic Valve: 26 mm stented (TAVR) valve is present in the aortic position. Procedure Date: 09/08/21.  Sonographer:    Samule Ohm RDCS Referring Phys: [phone] JILL D MCDANIEL   Sonographer Comments: Technically difficult study due to poor echo windows. Image acquisition challenging due to patient body habitus. IMPRESSIONS   1. Left ventricular ejection fraction, by estimation, is 60 to 65%. The left ventricle has normal function. There is severe concentric left ventricular hypertrophy. Consider infiltrative cardiomyopathy. Left ventricular diastolic parameters are consistent with Grade I diastolic dysfunction (impaired relaxation). With valsalva there is a peak intracavitary gradient of . 2. Right ventricular systolic function is normal. The right ventricular size is normal. 3. Left atrial size was severely dilated. 4. The mitral valve is abnormal. Severe mitral valve regurgitation secondary to SAM. No evidence of mitral stenosis. Severe posterior mitral annular calcification. 5. Tricuspid valve regurgitation is mild to moderate. 6. The aortic valve has been repaired/replaced. Aortic valve regurgitation is not  visualized. No aortic stenosis is present. There is a 26 mm stented (TAVR) valve present in the aortic position. Procedure Date: 09/08/21. Aortic valve mean gradient measures 14.7 mmHg. Aortic valve Vmax measures 2.71 m/s. 7. The inferior vena cava is normal in size with greater than 50% respiratory variability, suggesting right atrial pressure of 3 mmHg.  FINDINGS Left Ventricle: Left ventricular ejection fraction, by estimation, is 60 to 65%. The left ventricle has normal function. The left ventricle has no regional wall motion abnormalities. The left ventricular internal cavity size was normal in size. There is severe concentric left ventricular hypertrophy. Left ventricular diastolic parameters are consistent with Grade I diastolic dysfunction (impaired relaxation).  Right Ventricle: The right ventricular size is normal. No increase in right ventricular wall thickness. Right ventricular systolic function is normal.  Left Atrium: Left atrial size was severely dilated.  Right Atrium: Right atrial size was normal in size.  Pericardium: There is no evidence of pericardial effusion.  Mitral Valve: The mitral valve is abnormal. There is mild thickening of the mitral valve leaflet(s). There is mild calcification of the posterior mitral valve leaflet(s). Severe mitral annular calcification. Severe mitral valve regurgitation. No evidence of mitral valve stenosis.  Tricuspid Valve: The tricuspid valve is normal in structure. Tricuspid valve regurgitation is mild to moderate. No evidence of tricuspid stenosis.  Aortic Valve: The aortic valve has been repaired/replaced. Aortic valve regurgitation is not visualized. No aortic stenosis is present. Aortic valve mean gradient measures 14.7 mmHg. Aortic valve peak gradient measures 29.4 mmHg. There is a 26 mm stented (TAVR) valve present in the aortic position. Procedure Date: 09/08/21.  Pulmonic Valve: The pulmonic valve was normal in structure. Pulmonic  valve regurgitation is trivial. No evidence of pulmonic stenosis.  Aorta: The aortic root is normal in size and structure.  Venous: The inferior vena cava is normal in size with greater than 50% respiratory variability, suggesting right atrial pressure of 3 mmHg.  IAS/Shunts: No atrial level shunt detected by color flow Doppler.   LEFT VENTRICLE PLAX 2D LVIDd:         3.90 cm   Diastology LVIDs:         2.80 cm   LV e' medial:    4.35 cm/s LV PW:         1.30 cm   LV E/e' medial:  24.8 LV IVS:  2.00 cm   LV e' lateral:   5.66 cm/s LVOT diam:     1.70 cm   LV E/e' lateral: 19.1 LVOT Area:     2.27 cm   RIGHT VENTRICLE             IVC RV S prime:     18.00 cm/s  IVC diam: 1.20 cm TAPSE (M-mode): 1.6 cm RVSP:           28.0 mmHg  LEFT ATRIUM             Index        RIGHT ATRIUM           Index LA diam:        3.60 cm 2.35 cm/m   RA Pressure: 3.00 mmHg LA Vol (A2C):   75.2 ml 49.12 ml/m  RA Area:     10.30 cm LA Vol (A4C):   75.4 ml 49.25 ml/m  RA Volume:   21.70 ml  14.17 ml/m LA Biplane Vol: 76.3 ml 49.84 ml/m AORTIC VALVE AV Vmax:      271.33 cm/s AV Vmean:     172.333 cm/s AV VTI:       0.559 m AV Peak Grad: 29.4 mmHg AV Mean Grad: 14.7 mmHg  AORTA Ao Root diam: 2.50 cm Ao Asc diam:  3.10 cm  MITRAL VALVE                TRICUSPID VALVE MV Area (PHT): 2.03 cm     TR Peak grad:   25.0 mmHg MV Decel Time: 373 msec     TR Vmax:        250.00 cm/s MV E velocity: 108.00 cm/s  Estimated RAP:  3.00 mmHg MV A velocity: 125.00 cm/s  RVSP:           28.0 mmHg MV E/A ratio:  0.86 SHUNTS Systemic Diam: 1.70 cm  Aditya Sabharwal Electronically signed by Dorthula Nettles Signature Date/Time: 11/12/2022/10:34:28 AM    Final    MONITORS  LONG TERM MONITOR-LIVE TELEMETRY (3-14 DAYS) 10/31/2018  Narrative  Sinus bradycardia to sinus tachycardia.  Very short runs of atrial tachycardia and nsVT.  No atrial fibrillation.  Sinus bradycardia to sinus  tachycardia. Very short runs of atrial tachycardia and nsVT. No atrial fibrillation. Continue the same dose of metoprolol.   CT SCANS  CT CORONARY MORPH W/CTA COR W/SCORE 04/13/2021  Addendum 04/13/2021  2:25 PM ADDENDUM REPORT: 04/13/2021 14:22  CLINICAL DATA:  Aortic Valve pathology with assessment for TAVR  EXAM: Cardiac TAVR CT  TECHNIQUE: The patient was scanned on a Siemens Force 192 slice scanner. A 120 kV retrospective scan was triggered in the descending thoracic aorta at 111 HU's. Gantry rotation speed was 270 msecs and collimation was .9 mm. No beta blockade or nitro were given. The 3D data set was reconstructed in 5% intervals of the R-R cycle. Systolic and diastolic phases were analyzed on a dedicated work station using MPR, MIP and VRT modes. The patient received 95 cc of contrast.  FINDINGS: Aortic Valve: Severely thickened aortic valve with heavy calcification and reduced excursion the planimeter valve area is 0.84 Sq cm consistent with severe aortic stenosis  Number of leaflets: 3  LVOT calcification: none  Annular calcification: none  Aortic Valve Calcium Score: 3195  Presence of basal septal hypertrophy: 16 mm  - Systolic annulus assessment is still greater than diastolic assessment  Perimembranous septal diameter: 7 mm  Mitral Valve: There is severe mitral  annular calcification  Aortic Annulus Measurements- 10% phase assessment  Major annulus diameter: 24 mm  Minor annulus diameter: 18 mm  Annular perimeter: 66 mm  Annular area: 3.3 cm2  Aortic Root Measurements  Sinotubular Junction: 28 mm  Ascending Thoracic Aorta: 35 mm  Aortic Arch: 29 mm  Descending Thoracic Aorta: 21 mm  Sinus of Valsalva Measurements:  Right coronary cusp width: 27 mm  Left coronary cusp width: 28 mm  Non coronary cusp width: 28 mm  Mean diameter: 28 mm  Coronary Artery Height above Annulus:  Left Main: 10 mm  Left SoV height: 16 mm  Right  Coronary: 12 mm  Right SoV height: 16 mm  Optimum Fluoroscopic Angle for Delivery: LAO 3, CAU 7  Cusp overlay view angle:RAO 14, CAU 25  Valves for structural team consideration: Valve is at the lower limits for a 23 mm Sapien Valve vs upper limites for a 20 mm Sapien Valve. Coronary heights listed as above; there is sufficiency SoV diameter and height for a 26 mm CoreValve (lower limits of normal).  Non TAVR Valve Findings:  Coronary Calcium Score:  Left main: 398  Left anterior descending artery: 1165  Left circumflex artery: 40  Right coronary artery: 1524  Ramus Intermedius: 213  Total: 3340  Percentile: 99th for age, sex, and race matched control.  Aortic atherosclerosis noted.  Vascular air seen in the main pulmonary artery.  No left atrial appendage.  IMPRESSION: 1. Severe Aortic stenosis. Findings pertinent to TAVR procedure are detailed above.  2. Patient's total coronary artery calcium score is 3340, which is 99th percentile for subjects of the same age, gender, and race based populations.  3.  Aortic atherosclerosis.  RECOMMENDATIONS:  Coronary artery calcium (CAC) score is a strong predictor of incident coronary heart disease (CHD) and provides predictive information beyond traditional risk factors. CAC scoring is reasonable to use in the decision to withhold, postpone, or initiate statin therapy in intermediate-risk or selected borderline-risk asymptomatic adults (age 39-75 years and LDL-C >=70 to <190 mg/dL) who do not have diabetes or established atherosclerotic cardiovascular disease (ASCVD).* In intermediate-risk (10-year ASCVD risk >=7.5% to <20%) adults or selected borderline-risk (10-year ASCVD risk >=5% to <7.5%) adults in whom a CAC score is measured for the purpose of making a treatment decision the following recommendations have been made:  If CAC = 0, it is reasonable to withhold statin therapy and reassess in 5 to 10 years, as  long as higher risk conditions are absent (diabetes mellitus, family history of premature CHD in first degree relatives (males <55 years; females <65 years), cigarette smoking, LDL >=190 mg/dL or other independent risk factors).  If CAC is 1 to 99, it is reasonable to initiate statin therapy for patients >=33 years of age.  If CAC is >=100 or >=75th percentile, it is reasonable to initiate statin therapy at any age.  Cardiology referral should be considered for patients with CAC scores >=400 or >=75th percentile.  *2018 AHA/ACC/AACVPR/AAPA/ABC/ACPM/ADA/AGS/APhA/ASPC/NLA/PCNA Guideline on the Management of Blood Cholesterol: A Report of the American College of Cardiology/American Heart Association Task Force on Clinical Practice Guidelines. J Am Coll Cardiol. 2019;73(24):3168-3209.  Mahesh  Chandrasekhar   Electronically Signed By: Riley Lam M.D. On: 04/13/2021 14:22  Narrative EXAM: OVER-READ INTERPRETATION  CT CHEST  The following report is an over-read performed by radiologist Dr. Cleone Slim of [facility] on 04/13/2021. This over-read does not include interpretation of cardiac or coronary anatomy or pathology. The coronary CTA interpretation  by the cardiologist is attached.  COMPARISON:  04/27/2017 chest CT angiogram.  FINDINGS: Please see the separate concurrent chest CT angiogram report for details.  IMPRESSION: Please see the separate concurrent chest CT angiogram report for details.  Electronically Signed: By: Delbert Phenix M.D. On: 04/13/2021 13:17          EKG:  EKG is NOT ordered today.    Recent Labs: 11/17/2021: TSH 1.59 12/21/2021: ALT 15; BUN 10; Creatinine, Ser 0.63; Potassium 3.4; Sodium 137 06/14/2022: Hemoglobin 13.6; Platelets 307  Recent Lipid Panel    Component Value Date/Time   CHOL 151 10/07/2022 1049   TRIG 129 10/07/2022 1049   HDL 72 10/07/2022 1049   CHOLHDL 2.1 10/07/2022 1049   CHOLHDL 3 11/17/2021  1155   VLDL 35.6 11/17/2021 1155   LDLCALC 57 10/07/2022 1049   LDLDIRECT 133.8 12/19/2012 0828     Risk Assessment/Calculations:    CHA2DS2-VASc Score = 5   This indicates a 7.2% annual risk of stroke. The patient's score is based upon: CHF History: 0 HTN History: 1 Diabetes History: 0 Stroke History: 0 Vascular Disease History: 1 Age Score: 2 Gender Score: 1      Physical Exam:    VS:  BP 132/78   Pulse (!) 59   Ht 5\' 1"  (1.549 m)   Wt 120 lb (54.4 kg)   SpO2 98%   BMI 22.67 kg/m     Wt Readings from Last 3 Encounters:  11/12/22 120 lb (54.4 kg)  10/21/22 122 lb (55.3 kg)  06/14/22 122 lb (55.3 kg)     GEN:  Well nourished, well developed in no acute distress HEENT: Normal NECK: No JVD LYMPHATICS: No lymphadenopathy CARDIAC: RRR, + systolic murmur. No rubs, gallops RESPIRATORY:  Clear to auscultation without rales, wheezing or rhonchi  ABDOMEN: Soft, non-tender, non-distended MUSCULOSKELETAL:  No edema; No deformity  SKIN: Warm and dry NEUROLOGIC:  Alert and oriented x 3 PSYCHIATRIC:  Normal affect   ASSESSMENT:    1. S/P TAVR (transcatheter aortic valve replacement)   2. Paroxysmal atrial fibrillation (HCC)   3. HOCM (hypertrophic obstructive cardiomyopathy) (HCC)   4. Mitral valve disease   5. Essential hypertension    PLAN:    In order of problems listed above:  Severe AS s/p TAVR: echo today shows EF 65%, severe LVH, normally functioning TAVR with a mean gradient of 14.7 mm hg and no PVL. She has NYHA class I symptoms ([facility] filled out on 6/17 phone note). Continue on Eliquis. I will see her back in 6 months given Dr. Devin Going relocation and then get her in with Dr. Lorna [name] to follow her HOCM.   Paroxysmal Afib: CHADs-vasc 5. Currently doing well with no palpitations. -Conitnue apixban 5mg  BID -Continue metoprolol 50mg  XL daily   HOCM with Subvalvular Stenosis: LVEF 65% with peak gradient through LVOT , SAM of the anterior  mitral leaflet. -Continue metop 50mg  XL daily   Moderate to Severe MR: -Severe MR due to [facility] on echo today.  -Continue serial monitoring with repeat echoes -Euvolemic and compensated on exam  HTN: BP well controlled today. No changes made.   Medication Adjustments/Labs and Tests Ordered: Current medicines are reviewed at length with the patient today.  Concerns regarding medicines are outlined above.  No orders of the defined types were placed in this encounter.  Meds ordered this encounter  Medications   amoxicillin (AMOXIL) 500 MG tablet    Sig: Take 4 tablets (2,000 mg total) by mouth as  directed. 1 hour prior to dental work including cleanings    Dispense:  12 tablet    Refill:  12    Order Specific Question:   Supervising Provider    Answer:   Carrie [name] [[id number]]    Patient Instructions  Medication Instructions:  Your physician recommends that you continue on your current medications as directed. Please refer to the Current Medication list given to you today. *If you need a refill on your cardiac medications before your next appointment, please call your pharmacy*   Lab Work: None ordered   Testing/Procedures: None ordered   Follow-Up: At [facility], you and your health needs are our priority.  As part of our continuing mission to provide you with exceptional heart care, we have created designated Provider Care Teams.  These Care Teams include your primary Cardiologist (physician) and Advanced Practice Providers (APPs -  Physician Assistants and Nurse Practitioners) who all work together to provide you with the care you need, when you need it.  We recommend signing up for the patient portal called "MyChart".  Sign up information is provided on this After Visit Summary.  MyChart is used to connect with patients for Virtual Visits (Telemedicine).  Patients are able to view lab/test results, encounter notes, upcoming appointments, etc.  Non-urgent messages can  be sent to your provider as well.   To learn more about what you can do with MyChart, go to [email or web address].    Your next appointment:   6 month(s)  Provider:   Carlean Jews, PA-C    Other Instructions     Signed, Cline Crock, PA-C  11/12/2022 12:29 PM    [facility]

## 2022-11-12 ENCOUNTER — Ambulatory Visit: Payer: Medicare HMO | Admitting: Physician Assistant

## 2022-11-12 ENCOUNTER — Ambulatory Visit (HOSPITAL_COMMUNITY): Payer: Medicare HMO | Attending: Internal Medicine

## 2022-11-12 VITALS — BP 132/78 | HR 59 | Ht 61.0 in | Wt 120.0 lb

## 2022-11-12 DIAGNOSIS — I059 Rheumatic mitral valve disease, unspecified: Secondary | ICD-10-CM | POA: Insufficient documentation

## 2022-11-12 DIAGNOSIS — I480 Paroxysmal atrial fibrillation: Secondary | ICD-10-CM

## 2022-11-12 DIAGNOSIS — Z952 Presence of prosthetic heart valve: Secondary | ICD-10-CM

## 2022-11-12 DIAGNOSIS — I421 Obstructive hypertrophic cardiomyopathy: Secondary | ICD-10-CM | POA: Insufficient documentation

## 2022-11-12 DIAGNOSIS — I10 Essential (primary) hypertension: Secondary | ICD-10-CM | POA: Diagnosis not present

## 2022-11-12 LAB — ECHOCARDIOGRAM COMPLETE
AV Mean grad: 14.7 mmHg
AV Peak grad: 29.4 mmHg
Ao pk vel: 2.71 m/s
Area-P 1/2: 2.03 cm2
Height: 61 in
S' Lateral: 2.8 cm
Weight: 1920 oz

## 2022-11-12 MED ORDER — AMOXICILLIN 500 MG PO TABS: 2000.0000 mg | ORAL_TABLET | ORAL | 12 refills | Status: AC

## 2022-11-12 NOTE — Patient Instructions (Addendum)
Medication Instructions:  Your physician recommends that you continue on your current medications as directed. Please refer to the Current Medication list given to you today. *If you need a refill on your cardiac medications before your next appointment, please call your pharmacy*   Lab Work: None ordered   Testing/Procedures: None ordered   Follow-Up: At [facility], you and your health needs are our priority.  As part of our continuing mission to provide you with exceptional heart care, we have created designated Provider Care Teams.  These Care Teams include your primary Cardiologist (physician) and Advanced Practice Providers (APPs -  Physician Assistants and Nurse Practitioners) who all work together to provide you with the care you need, when you need it.  We recommend signing up for the patient portal called "MyChart".  Sign up information is provided on this After Visit Summary.  MyChart is used to connect with patients for Virtual Visits (Telemedicine).  Patients are able to view lab/test results, encounter notes, upcoming appointments, etc.  Non-urgent messages can be sent to your provider as well.   To learn more about what you can do with MyChart, go to [email or web address].    Your next appointment:   6 month(s)  Provider:   Carlean Jews, PA-C    Other Instructions

## 2022-12-13 ENCOUNTER — Ambulatory Visit: Payer: Medicare HMO | Admitting: Cardiology

## 2022-12-20 ENCOUNTER — Ambulatory Visit: Payer: Medicare HMO | Admitting: Cardiology

## 2023-01-03 ENCOUNTER — Other Ambulatory Visit: Payer: Self-pay | Admitting: *Deleted

## 2023-01-03 DIAGNOSIS — Z79899 Other long term (current) drug therapy: Secondary | ICD-10-CM

## 2023-01-03 DIAGNOSIS — I2510 Atherosclerotic heart disease of native coronary artery without angina pectoris: Secondary | ICD-10-CM

## 2023-01-03 DIAGNOSIS — E785 Hyperlipidemia, unspecified: Secondary | ICD-10-CM

## 2023-01-03 MED ORDER — EZETIMIBE 10 MG PO TABS
10.0000 mg | ORAL_TABLET | Freq: Every day | ORAL | 3 refills | Status: DC
Start: 2023-01-03 — End: 2023-12-14

## 2023-02-15 ENCOUNTER — Telehealth: Payer: Self-pay

## 2023-02-15 ENCOUNTER — Other Ambulatory Visit (HOSPITAL_COMMUNITY): Payer: Self-pay

## 2023-02-15 NOTE — Telephone Encounter (Signed)
Please advise 

## 2023-02-15 NOTE — Telephone Encounter (Signed)
Pt ready for scheduling for Prolia on or after : 03/09/23  Out-of-pocket cost due at time of visit: $~327  Primary: [employer] - MEDICARE Prolia co-insurance: 20% Admin fee co-insurance: 20%  Secondary: N/A Prolia co-insurance:  Admin fee co-insurance:   Medical Benefit Details: Date Benefits were checked: 02/15/23 Deductible: no/ Coinsurance: 20%/ Admin Fee: 20%  Prior Auth: Approved PA# [id number] Expiration Date: 09/09/23  # of doses approved:  Pharmacy benefit: Copay $439.31 If patient wants fill through the pharmacy benefit please send prescription to: [employer], and include estimated need by date in rx notes. Pharmacy will ship medication directly to the office.  Patient not eligible for Prolia Copay Card. Copay Card can make patient's cost as little as $25. Link to apply: [email or web address]://[email or web address].[email or web address]  ** This summary of benefits is an estimation of the patient's out-of-pocket cost. Exact cost may very based on individual plan coverage.

## 2023-02-28 DIAGNOSIS — L280 Lichen simplex chronicus: Secondary | ICD-10-CM | POA: Diagnosis not present

## 2023-02-28 DIAGNOSIS — L821 Other seborrheic keratosis: Secondary | ICD-10-CM | POA: Diagnosis not present

## 2023-04-04 ENCOUNTER — Ambulatory Visit: Payer: Medicare HMO | Admitting: Family Medicine

## 2023-05-02 ENCOUNTER — Other Ambulatory Visit: Payer: Self-pay | Admitting: Physician Assistant

## 2023-05-02 DIAGNOSIS — Z952 Presence of prosthetic heart valve: Secondary | ICD-10-CM

## 2023-05-02 DIAGNOSIS — I340 Nonrheumatic mitral (valve) insufficiency: Secondary | ICD-10-CM

## 2023-05-02 DIAGNOSIS — I480 Paroxysmal atrial fibrillation: Secondary | ICD-10-CM

## 2023-05-02 DIAGNOSIS — I421 Obstructive hypertrophic cardiomyopathy: Secondary | ICD-10-CM

## 2023-05-02 DIAGNOSIS — I350 Nonrheumatic aortic (valve) stenosis: Secondary | ICD-10-CM

## 2023-05-02 DIAGNOSIS — I10 Essential (primary) hypertension: Secondary | ICD-10-CM

## 2023-05-02 MED ORDER — ELIQUIS 5 MG PO TABS: 5.0000 mg | ORAL_TABLET | Freq: Two times a day (BID) | ORAL | 3 refills | Status: DC

## 2023-05-06 ENCOUNTER — Ambulatory Visit: Payer: Medicare HMO

## 2023-06-09 NOTE — Telephone Encounter (Signed)
Good afternoon ya'll!  Can we get an updated prior authroziation for patient's prolia for the new year? Thanks in advance!

## 2023-06-10 ENCOUNTER — Telehealth: Payer: Self-pay

## 2023-06-10 NOTE — Telephone Encounter (Signed)
Prolia VOB initiated via [email or web address]

## 2023-06-14 NOTE — Telephone Encounter (Signed)
 Carrie [name]

## 2023-06-20 ENCOUNTER — Other Ambulatory Visit (HOSPITAL_COMMUNITY): Payer: Self-pay

## 2023-06-20 NOTE — Telephone Encounter (Signed)
 Pt ready for scheduling for Prolia on or after : 06/20/23  Out-of-pocket cost due at time of visit: $475  Number of injection/visits approved: --  Primary: [employer] - Medicare Prolia co-insurance: 20% Admin fee co-insurance: 20%  Secondary: N/A Prolia co-insurance:  Admin fee co-insurance:   Medical Benefit Details: Date Benefits were checked: 06/10/23 Deductible: no/ Coinsurance: 20%/ Admin Fee: 20%  Prior Auth: Approved PA# [id number] Expiration Date: 09/09/23  # of doses approved:  Pharmacy benefit: Copay $645.79 If patient wants fill through the pharmacy benefit please send prescription to: [employer], and include estimated need by date in rx notes. Pharmacy will ship medication directly to the office.  Patient not eligible for Prolia Copay Card. Copay Card can make patient's cost as little as $25. Link to apply: [email or web address]://[email or web address]  ** This summary of benefits is an estimation of the patient's out-of-pocket cost. Exact cost may very based on individual plan coverage.

## 2023-06-21 NOTE — Telephone Encounter (Signed)
 Called and scheduled patient for their prolia injection. Informed them of OOP amount due at date of visit.

## 2023-06-27 ENCOUNTER — Telehealth: Payer: Self-pay | Admitting: Internal Medicine

## 2023-06-27 NOTE — Telephone Encounter (Signed)
 Called pt to f/u question regarding fasting for OV.  Pt reports has not had cholesterol checked with PCP recently.  Advised pt to fast prior to OV so that FLP may be drawn.  Pt thanked me for returning call.

## 2023-06-27 NOTE — Telephone Encounter (Signed)
 Pt has office visit tomorrow and is requesting a callback regarding her wanting to know if she needs to fast tomorrow before coming to her appt incase labs need to be done to avoid her making 2 trips to the office. Please advise

## 2023-06-28 ENCOUNTER — Ambulatory Visit: Payer: Medicare HMO | Attending: Internal Medicine | Admitting: Internal Medicine

## 2023-06-28 VITALS — BP 110/60 | HR 88 | Ht 60.0 in | Wt 118.6 lb

## 2023-06-28 DIAGNOSIS — I421 Obstructive hypertrophic cardiomyopathy: Secondary | ICD-10-CM | POA: Diagnosis not present

## 2023-06-28 DIAGNOSIS — I480 Paroxysmal atrial fibrillation: Secondary | ICD-10-CM

## 2023-06-28 DIAGNOSIS — I350 Nonrheumatic aortic (valve) stenosis: Secondary | ICD-10-CM | POA: Diagnosis not present

## 2023-06-28 DIAGNOSIS — Z952 Presence of prosthetic heart valve: Secondary | ICD-10-CM

## 2023-06-28 NOTE — Patient Instructions (Signed)
 Medication Instructions:  Your physician recommends that you continue on your current medications as directed. Please refer to the Current Medication list given to you today.  *If you need a refill on your cardiac medications before your next appointment, please call your pharmacy*   Lab Work: NONE  If you have labs (blood work) drawn today and your tests are completely normal, you will receive your results only by: MyChart Message (if you have MyChart) OR A paper copy in the mail If you have any lab test that is abnormal or we need to change your treatment, we will call you to review the results.   Testing/Procedures: JULY- - -Your physician has requested that you have an echocardiogram. Echocardiography is a painless test that uses sound waves to create images of your heart. It provides your doctor with information about the size and shape of your heart and how well your heart's chambers and valves are working. This procedure takes approximately one hour. There are no restrictions for this procedure. Please do NOT wear cologne, perfume, aftershave, or lotions (deodorant is allowed). Please arrive 15 minutes prior to your appointment time.  Please note: We ask at that you not bring children with you during ultrasound (echo/ vascular) testing. Due to room size and safety concerns, children are not allowed in the ultrasound rooms during exams. Our front office staff cannot provide observation of children in our lobby area while testing is being conducted. An adult accompanying a patient to their appointment will only be allowed in the ultrasound room at the discretion of the ultrasound technician under special circumstances. We apologize for any inconvenience.    Follow-Up: At [facility], you and your health needs are our priority.  As part of our continuing mission to provide you with exceptional heart care, we have created designated Provider Care Teams.  These Care Teams include  your primary Cardiologist (physician) and Advanced Practice Providers (APPs -  Physician Assistants and Nurse Practitioners) who all work together to provide you with the care you need, when you need it.   Your next appointment:   1 year(s)  Provider:   Christell Constant, MD

## 2023-06-28 NOTE — Progress Notes (Signed)
 Cardiology Office Note:  .    Date:  06/28/2023  ID:  Carrie [name], DOB 24-Feb-1946, MRN [id number] PCP: Carrie Grandchild, MD  [facility] Providers Cardiologist:  Christell Constant, MD     CC: establish care   History of Present Illness: .    iscussed the use of AI scribe software for clinical note transcription with the patient, who gave verbal consent to proceed.  Carrie [name] is a 78 year old female with hypertrophic obstructive cardiomyopathy who presents for follow-up.  She has a history of hypertrophic obstructive cardiomyopathy with a previously severe left ventricular outflow tract gradient. She underwent a transcatheter aortic valve replacement and was last seen by Carrie [name], after which she was reportedly doing well. Currently, she has no symptoms of chest pain or shortness of breath and is able to walk between half a mile to a mile daily. No recent episodes of atrial fibrillation have occurred, and her heart rate remains below 100 with medication.  She has a history of severe mitral regurgitation, mild mitral stenosis, and mild to moderate aortic stenosis. She reports no swelling and feels generally well. She no longer cooks and relies on her son for meals, as she cannot eat as much as her family does.  In terms of family history, her son recently had a physical examination and was found to have no high blood pressure or heart murmur. He is active, playing basketball and cycling. Her daughter has blood pressure issues, attributed to stress, and works at [employer]. She has two children, and there is no mention of them having hypertrophic cardiomyopathy.   Relevant histories: .  Social Former [employer]. Two children note screened ROS: As per HPI.   Studies Reviewed: .   Cardiac Studies & Procedures   ______________________________________________________________________________________________ CARDIAC CATHETERIZATION  CARDIAC  CATHETERIZATION 04/22/2021  Narrative   Mid RCA lesion is 50% stenosed.   Hemodynamic findings consistent with mild pulmonary hypertension.   There is severe aortic valve stenosis.  1.  Calcified but nonobstructive coronary artery disease with patency of the left main, LAD, left circumflex, ramus intermedius, and right coronary arteries 2.  Heavy mitral annular calcification with known moderate to severe mitral regurgitation and mild mitral stenosis by echo assessment 3.  Severe aortic stenosis with mean transvalvular gradient 27 mmHg and calculated aortic valve area 0.97 cm (probable combination of valvular and subvalvular stenosis) 4.  Mild pulmonary hypertension with mean pulmonary artery pressure 28 mmHg, transpulmonary gradient 9 mmHg, V wave 24 mmHg.  Continued evaluation of valvular heart disease with consideration of cardiac surgery  Findings Coronary Findings Diagnostic  Dominance: Right  Left Main There is mild diffuse disease throughout the vessel. Calcified but widely patent with no significant stenosis  Left Anterior Descending There is mild diffuse disease throughout the vessel. The vessel is moderately calcified. The LAD is calcified throughout its proximal and mid segments.  The proximal vessel is patent with no stenosis.  The first diagonal is patent with very mild ostial narrowing.  The diagonal is a large caliber vessel.  The mid and distal LAD have mild diffuse irregularities with no significant stenoses  Ramus Intermedius There is mild diffuse disease throughout the vessel. The intermediate branch is patent with mild proximal irregularity and nonobstructive stenosis  Left Circumflex The vessel exhibits minimal luminal irregularities. The circumflex is patent.  The first obtuse marginal branch is patent with minimal irregularity.  Right Coronary Artery There is mild diffuse disease  throughout the vessel. Dominant vessel with diffuse calcification.  The proximal  vessel is widely patent.  The mid vessel has diffuse irregular 40 to 50% stenosis.  The distal vessel has mild irregularity with no significant stenosis.  The PDA and PLA branches are patent without significant stenoses. Mid RCA lesion is 50% stenosed. The lesion is moderately calcified.  Intervention  No interventions have been documented.   STRESS TESTS  ECHOCARDIOGRAM STRESS TEST 10/20/2020  Narrative EXERCISE STRESS ECHO REPORT   --------------------------------------------------------------------------------  Patient Name:   Carrie [name] Date of Exam: 10/20/2020 Medical Rec #:  [id number]               Height:       61.0 in Accession #:    [id number]              Weight:       129.8 lb Date of Birth:  November 09, 1945                BSA:          1.572 m Patient Age:    75 years                BP:           153/77 mmHg Patient Gender: F                       HR:           79 bpm. Exam Location:  Outpatient  Procedure: Stress Echo  MODIFIED REPORT: This report was modified by Weston Brass MD on 10/20/2020 due to revision. Indications:     Aortic Stenosis, Mitral Regurgitation  History:         Patient has prior history of Echocardiogram examinations, most recent 09/10/2020.  Sonographer:     Irving Burton Senior RDCS Referring Phys:  [phone] MICHAEL Catoe Diagnosing Phys: Weston Brass MD   Sonographer Comments: Designated Party Release on file 12/25/2009 IMPRESSIONS   1. Patient exercised on the treadmill for 6:48 mins and stopped due to leg fatigue. Reading MD present in room for stress test, patient denied chest pain or pressure, DOE, lightheadedness or dizziness and had no presyncope throughout study and at peak stress. No symptoms in recovery. No hypotension during stress test. 2. Baseline echocardiogram demonstrated severe aortic valve stenosis with a mean gradient of 47 mmHg, dynamic LVOT instantaneous gradient of 30 mmHg, severe MR due to SAM, and moderate AI. 3.  Post stress images were challenging to obtain after peak stress. Post stress images demonstrate severe AS, dynamic LVOT obstruction with instantaneous gradient of 34 mmHg, severe MR due to SAM, and moderate AI. Trivial to mild TR with RVSP of 32 mmHg and estimated RA pressure of 3 mmHg. 4. Nonspecific ST T wave changes laterally at baseline. 1-2 mm horizontal ST depressions in V5-V6 that begin early in stress and are most prominent at peak stress. This may be a nonspecific finding given baseline nonspecific ECG changes. 5. This study was not performed to evaluate for ischemia. 6. The findings of the study demonstrate that true severe aortic stenosis is present. 7. This is an indeterminate risk study for ischemia. Low risk hemodyanmic stress test.  Conclusion(s)/Recommendation(s): Study performed with good exercise capacity and no cardiopulmonary symptoms in setting of severe AS, moderate-severe LVOT obstruction, severe MR, and moderate AI. Unable to demostrate pulmonary hypertension with exercise stress.  FINDINGS  Exam Protocol: The patient exercised on a treadmill according  to a Bruce protocol.   Patient Performance: The patient exercised for 6 minutes and 48 seconds, achieving 8.5 METS. The maximum stage achieved was III of the Bruce protocol. The baseline heart rate was 69 bpm. The heart rate at peak stress was 150 bpm. The target heart rate was calculated to be 123 bpm. The percentage of maximum predicted heart rate achieved was 103.8 %. The baseline blood pressure was 112/66 mmHg. The blood pressure at peak stress was 163/83 mmHg. The blood pressure response was normal. The patient developed leg fatigue during the stress exam. The symptoms resolved with rest. The patient's functional capacity was above average.  EKG: Resting EKG showed normal sinus rhythm with nonspecific ST-T wave changes and left ventricular hypertrophy. The patient developed baseline abnormalities during exercise. There  was 2.0 mm of horizontal ST segment depression in lead(s) V5 and V6 that resolved in 5 minute(s) into recovery.   2D Echo Findings: The baseline ejection fraction was 70%. Baseline regional wall motion abnormalities were not present. This is an inconclusive stress echocardiogram for ischemia.  Stress Doppler: Aortic Valve: Baseline hemodynamics were: (LVOT VTI 38 cm ), . The findings of the study demonstrate that true severe aortic stenosis is present.  LVOT: The baseline LVOT gradient was 34 mmHG.  Mitral Regurgitation: At baseline, severe mitral regurgitation was present.  Right Ventricular Systolic Pressure: The PASP increased to 32.4 mmHg at peak stress.   Weston Brass MD Electronically signed on 10/20/2020 at 12:07:17 PM      Final (Updated)   ECHOCARDIOGRAM  ECHOCARDIOGRAM COMPLETE 11/12/2022  Narrative ECHOCARDIOGRAM REPORT    Patient Name:   [facility] SPRINKLE Monroy Date of Exam: 11/12/2022 Medical Rec #:  [id number]               Height:       61.0 in Accession #:    [id number]              Weight:       122.0 lb Date of Birth:  [birth year]                BSA:          1.531 m Patient Age:    77 years                BP:           148/83 mmHg Patient Gender: F                       HR:           63 bpm. Exam Location:  [address]  Procedure: 2D Echo, Cardiac Doppler and Color Doppler  Indications:    Z95.2 S/p TAVR  History:        Patient has prior history of Echocardiogram examinations, most recent 10/19/2021. HOCM, S/p TAVR (26mm Medtronic Evolut), Arrythmias:Atrial Fibrillation and SVT; Risk Factors:Hypertension and Dyslipidemia. Aortic Valve: 26 mm stented (TAVR) valve is present in the aortic position. Procedure Date: 09/08/21.  Sonographer:    Samule Ohm RDCS Referring Phys: [phone] JILL D MCDANIEL   Sonographer Comments: Technically difficult study due to poor echo windows. Image acquisition challenging due to patient body  habitus. IMPRESSIONS   1. Left ventricular ejection fraction, by estimation, is 60 to 65%. The left ventricle has normal function. There is severe concentric left ventricular hypertrophy. Consider infiltrative cardiomyopathy. Left ventricular diastolic parameters are consistent with Grade I diastolic dysfunction (impaired relaxation). With valsalva there  is a peak intracavitary gradient of . 2. Right ventricular systolic function is normal. The right ventricular size is normal. 3. Left atrial size was severely dilated. 4. The mitral valve is abnormal. Severe mitral valve regurgitation secondary to SAM. No evidence of mitral stenosis. Severe posterior mitral annular calcification. 5. Tricuspid valve regurgitation is mild to moderate. 6. The aortic valve has been repaired/replaced. Aortic valve regurgitation is not visualized. No aortic stenosis is present. There is a 26 mm stented (TAVR) valve present in the aortic position. Procedure Date: 09/08/21. Aortic valve mean gradient measures 14.7 mmHg. Aortic valve Vmax measures 2.71 m/s. 7. The inferior vena cava is normal in size with greater than 50% respiratory variability, suggesting right atrial pressure of 3 mmHg.  FINDINGS Left Ventricle: Left ventricular ejection fraction, by estimation, is 60 to 65%. The left ventricle has normal function. The left ventricle has no regional wall motion abnormalities. The left ventricular internal cavity size was normal in size. There is severe concentric left ventricular hypertrophy. Left ventricular diastolic parameters are consistent with Grade I diastolic dysfunction (impaired relaxation).  Right Ventricle: The right ventricular size is normal. No increase in right ventricular wall thickness. Right ventricular systolic function is normal.  Left Atrium: Left atrial size was severely dilated.  Right Atrium: Right atrial size was normal in size.  Pericardium: There is no evidence of pericardial  effusion.  Mitral Valve: The mitral valve is abnormal. There is mild thickening of the mitral valve leaflet(s). There is mild calcification of the posterior mitral valve leaflet(s). Severe mitral annular calcification. Severe mitral valve regurgitation. No evidence of mitral valve stenosis.  Tricuspid Valve: The tricuspid valve is normal in structure. Tricuspid valve regurgitation is mild to moderate. No evidence of tricuspid stenosis.  Aortic Valve: The aortic valve has been repaired/replaced. Aortic valve regurgitation is not visualized. No aortic stenosis is present. Aortic valve mean gradient measures 14.7 mmHg. Aortic valve peak gradient measures 29.4 mmHg. There is a 26 mm stented (TAVR) valve present in the aortic position. Procedure Date: 09/08/21.  Pulmonic Valve: The pulmonic valve was normal in structure. Pulmonic valve regurgitation is trivial. No evidence of pulmonic stenosis.  Aorta: The aortic root is normal in size and structure.  Venous: The inferior vena cava is normal in size with greater than 50% respiratory variability, suggesting right atrial pressure of 3 mmHg.  IAS/Shunts: No atrial level shunt detected by color flow Doppler.   LEFT VENTRICLE PLAX 2D LVIDd:         3.90 cm   Diastology LVIDs:         2.80 cm   LV e' medial:    4.35 cm/s LV PW:         1.30 cm   LV E/e' medial:  24.8 LV IVS:        2.00 cm   LV e' lateral:   5.66 cm/s LVOT diam:     1.70 cm   LV E/e' lateral: 19.1 LVOT Area:     2.27 cm   RIGHT VENTRICLE             IVC RV S prime:     18.00 cm/s  IVC diam: 1.20 cm TAPSE (M-mode): 1.6 cm RVSP:           28.0 mmHg  LEFT ATRIUM             Index        RIGHT ATRIUM  Index LA diam:        3.60 cm 2.35 cm/m   RA Pressure: 3.00 mmHg LA Vol (A2C):   75.2 ml 49.12 ml/m  RA Area:     10.30 cm LA Vol (A4C):   75.4 ml 49.25 ml/m  RA Volume:   21.70 ml  14.17 ml/m LA Biplane Vol: 76.3 ml 49.84 ml/m AORTIC VALVE AV Vmax:      271.33  cm/s AV Vmean:     172.333 cm/s AV VTI:       0.559 m AV Peak Grad: 29.4 mmHg AV Mean Grad: 14.7 mmHg  AORTA Ao Root diam: 2.50 cm Ao Asc diam:  3.10 cm  MITRAL VALVE                TRICUSPID VALVE MV Area (PHT): 2.03 cm     TR Peak grad:   25.0 mmHg MV Decel Time: 373 msec     TR Vmax:        250.00 cm/s MV E velocity: 108.00 cm/s  Estimated RAP:  3.00 mmHg MV A velocity: 125.00 cm/s  RVSP:           28.0 mmHg MV E/A ratio:  0.86 SHUNTS Systemic Diam: 1.70 cm  Aditya Sabharwal Electronically signed by Dorthula Nettles Signature Date/Time: 11/12/2022/10:34:28 AM    Final    MONITORS  LONG TERM MONITOR-LIVE TELEMETRY (3-14 DAYS) 10/31/2018  Narrative  Sinus bradycardia to sinus tachycardia.  Very short runs of atrial tachycardia and nsVT.  No atrial fibrillation.  Sinus bradycardia to sinus tachycardia. Very short runs of atrial tachycardia and nsVT. No atrial fibrillation. Continue the same dose of metoprolol.   CT SCANS  CT CORONARY MORPH W/CTA COR W/SCORE 04/13/2021  Addendum 04/13/2021  2:25 PM ADDENDUM REPORT: 04/13/2021 14:22  CLINICAL DATA:  Aortic Valve pathology with assessment for TAVR  EXAM: Cardiac TAVR CT  TECHNIQUE: The patient was scanned on a Siemens Force 192 slice scanner. A 120 kV retrospective scan was triggered in the descending thoracic aorta at 111 HU's. Gantry rotation speed was 270 msecs and collimation was .9 mm. No beta blockade or nitro were given. The 3D data set was reconstructed in 5% intervals of the R-R cycle. Systolic and diastolic phases were analyzed on a dedicated work station using MPR, MIP and VRT modes. The patient received 95 cc of contrast.  FINDINGS: Aortic Valve: Severely thickened aortic valve with heavy calcification and reduced excursion the planimeter valve area is 0.84 Sq cm consistent with severe aortic stenosis  Number of leaflets: 3  LVOT calcification: none  Annular calcification: none  Aortic  Valve Calcium Score: 3195  Presence of basal septal hypertrophy: 16 mm  - Systolic annulus assessment is still greater than diastolic assessment  Perimembranous septal diameter: 7 mm  Mitral Valve: There is severe mitral annular calcification  Aortic Annulus Measurements- 10% phase assessment  Major annulus diameter: 24 mm  Minor annulus diameter: 18 mm  Annular perimeter: 66 mm  Annular area: 3.3 cm2  Aortic Root Measurements  Sinotubular Junction: 28 mm  Ascending Thoracic Aorta: 35 mm  Aortic Arch: 29 mm  Descending Thoracic Aorta: 21 mm  Sinus of Valsalva Measurements:  Right coronary cusp width: 27 mm  Left coronary cusp width: 28 mm  Non coronary cusp width: 28 mm  Mean diameter: 28 mm  Coronary Artery Height above Annulus:  Left Main: 10 mm  Left SoV height: 16 mm  Right Coronary: 12 mm  Right SoV height: 16  mm  Optimum Fluoroscopic Angle for Delivery: LAO 3, CAU 7  Cusp overlay view angle:RAO 14, CAU 25  Valves for structural team consideration: Valve is at the lower limits for a 23 mm Sapien Valve vs upper limites for a 20 mm Sapien Valve. Coronary heights listed as above; there is sufficiency SoV diameter and height for a 26 mm CoreValve (lower limits of normal).  Non TAVR Valve Findings:  Coronary Calcium Score:  Left main: 398  Left anterior descending artery: 1165  Left circumflex artery: 40  Right coronary artery: 1524  Ramus Intermedius: 213  Total: 3340  Percentile: 99th for age, sex, and race matched control.  Aortic atherosclerosis noted.  Vascular air seen in the main pulmonary artery.  No left atrial appendage.  IMPRESSION: 1. Severe Aortic stenosis. Findings pertinent to TAVR procedure are detailed above.  2. Patient's total coronary artery calcium score is 3340, which is 99th percentile for subjects of the same age, gender, and race based populations.  3.  Aortic  atherosclerosis.  RECOMMENDATIONS:  Coronary artery calcium (CAC) score is a strong predictor of incident coronary heart disease (CHD) and provides predictive information beyond traditional risk factors. CAC scoring is reasonable to use in the decision to withhold, postpone, or initiate statin therapy in intermediate-risk or selected borderline-risk asymptomatic adults (age 77-75 years and LDL-C >=70 to <190 mg/dL) who do not have diabetes or established atherosclerotic cardiovascular disease (ASCVD).* In intermediate-risk (10-year ASCVD risk >=7.5% to <20%) adults or selected borderline-risk (10-year ASCVD risk >=5% to <7.5%) adults in whom a CAC score is measured for the purpose of making a treatment decision the following recommendations have been made:  If CAC = 0, it is reasonable to withhold statin therapy and reassess in 5 to 10 years, as long as higher risk conditions are absent (diabetes mellitus, family history of premature CHD in first degree relatives (males <55 years; females <65 years), cigarette smoking, LDL >=190 mg/dL or other independent risk factors).  If CAC is 1 to 99, it is reasonable to initiate statin therapy for patients >=78 years of age.  If CAC is >=100 or >=75th percentile, it is reasonable to initiate statin therapy at any age.  Cardiology referral should be considered for patients with CAC scores >=400 or >=75th percentile.  *2018 AHA/ACC/AACVPR/AAPA/ABC/ACPM/ADA/AGS/APhA/ASPC/NLA/PCNA Guideline on the Management of Blood Cholesterol: A Report of the American College of Cardiology/American Heart Association Task Force on Clinical Practice Guidelines. J Am Coll Cardiol. 2019;73(24):3168-3209.  Augustino Savastano   Electronically Signed By: Riley Lam M.D. On: 04/13/2021 14:22  Narrative EXAM: OVER-READ INTERPRETATION  CT CHEST  The following report is an over-read performed by radiologist Dr. Cleone Slim of [facility] on 04/13/2021. This over-read does not include interpretation of cardiac or coronary anatomy or pathology. The coronary CTA interpretation by the cardiologist is attached.  COMPARISON:  04/27/2017 chest CT angiogram.  FINDINGS: Please see the separate concurrent chest CT angiogram report for details.  IMPRESSION: Please see the separate concurrent chest CT angiogram report for details.  Electronically Signed: By: Delbert Phenix M.D. On: 04/13/2021 13:17     ______________________________________________________________________________________________       Physical Exam:    VS:  BP 110/60 (BP Location: Left Arm)   Pulse 88   Ht 5' (1.524 m)   Wt 118 lb 9.6 oz (53.8 kg)   SpO2 98%   BMI 23.16 kg/m    Wt Readings from Last 3 Encounters:  06/28/23 118 lb 9.6  oz (53.8 kg)  11/12/22 120 lb (54.4 kg)  10/21/22 122 lb (55.3 kg)    Gen: no distress, hard of hearing   Neck: No JVD Cardiac: No Rubs or Gallops, harsh systolic Murmur, RRR +2 radial pulses Respiratory: Clear to auscultation bilaterally, normal effort, normal  respiratory rate GI: Soft, nontender, non-distended  MS: No  edema;  moves all extremities Integument: Skin feels warm Neuro:  At time of evaluation, alert and oriented to person/place/time/situation  Psych: Normal affect, patient feels well   ASSESSMENT AND PLAN: .    Hypertrophic Obstructive Cardiomyopathy (HCM) Severe LVOT gradient, currently asymptomatic with no chest pain, dyspnea, or palpitations. Continues daily walking without issues. Discussed monitoring symptoms and potential need for aggressive care if symptoms develop, including medication adjustments or procedural interventions. - Order echocardiogram in July - s/p TAVR, was not thought to be a surgical candidate in 2023; I suspect if symptomatic in the future would need either CMI; mTEER, or ASA (non SRT candidate, would need modification prior to [facility] for valve in MAC I  suspect)  - Monitor for symptoms such as chest pain, dyspnea, or syncope Low cardiac risk for SCD - maximal septal thickness 17 mm. - CMR deferred - Increase metoprolol dose if symptoms develop (had prior bradycardia, limited options) - Gave education and handout; needs child screening - Schedule follow-up in one year  Severe Mitral Regurgitation and Mild Mitral Stenosis Currently asymptomatic. Discussed potential future interventions if symptoms such as dyspnea or decreased exercise tolerance develop, including medication adjustments or procedures like TEE or valve clipping (unclear if this is feasible, low threshold to get TEE for SAM related MR) - Monitor for symptoms such as dyspnea or decreased exercise tolerance - Consider medication adjustment or procedural intervention if symptoms develop  Atrial Fibrillation - CHADSVASC NA Well-controlled with medication. No current episodes. Blood pressure slightly low but asymptomatic. Explained necessity of anticoagulation to prevent complications. - Continue current medication regimen - Monitor heart rate and rhythm  General Health Maintenance Discussed family screening for hypertrophic cardiomyopathy. Recommended echocardiograms for her children due to genetic predisposition. Explained genetic testing as an option - Higher education careers adviser and QR code for further information  Follow-up - Schedule follow-up appointment in one year. Echo pending for s/p TAVR in July; needs ABX as PRN f/u   Riley Lam, MD FASE [facility] Cardiologist [facility]  [address] [place], [address] [place], [place] [address] [phone]  12:54 PM

## 2023-07-04 DIAGNOSIS — H524 Presbyopia: Secondary | ICD-10-CM | POA: Diagnosis not present

## 2023-07-08 NOTE — Telephone Encounter (Signed)
 Patient scheduled for Prolia on 07/18/23 and wants to get it during her 07/12/2023 appt with provider. Informed that was okay but would need to pay $475 at time of appt for injection.   Patient is cleared for PROLIA injection on 07/12/2023 per PA information on 06/20/23.  Mentioned in provider note last on 04/19/23. Medication is CLINIC supplied. NWGNF:$[id number]

## 2023-07-11 ENCOUNTER — Ambulatory Visit: Payer: Medicare HMO

## 2023-07-11 ENCOUNTER — Ambulatory Visit: Payer: Self-pay | Admitting: Internal Medicine

## 2023-07-11 NOTE — Telephone Encounter (Signed)
 Copied from CRM [id number]. Topic: Clinical - Red Word Triage >> Jul 11, 2023  8:37 AM Mackie Pai E wrote: [facility] that prompted transfer to Nurse Triage: Patient stating she has been experiencing a heart issue. Last week Thursday 3/13 she had a spell where she lost all energy and she was not able to function due to low energy.  Chief Complaint: Fatigue/low energy Symptoms: Fatigue/low energy  Frequency: Last Thursday Pertinent Negatives: Patient denies symptoms at this time Disposition: [] ED /[] Urgent Care (no appt availability in office) / [x] Appointment(In office/virtual)/ []  [facility]/ [] Home Care/ [] Refused Recommended Disposition /[] [facility]/ []  Follow-up with PCP Additional Notes: Patient called in to report an episode of fatigue/lack of energy that happened last Thursday when she was out to lunch with a friend. Patient stated that she felt more tired/different than normal during the episode. Patient stated the feeling of fatigue/lack of energy did not impact her ability to walk or perform activities. Patient stated she feels normal at this time. Patient denied weakness on one side of the body, chest pain, SOB, dizziness and headache. Patient is wondering if her medications needed to be adjusted. Per protocol, this RN advised patient to see her PCP within 24 hours. Patient already has an appointment scheduled for tomorrow morning. This RN advised patient to attend that appointment as scheduled and call back if symptoms worsen. Patient complied.   Reason for Disposition  Taking a medicine that could cause weakness (e.g., blood pressure medications, diuretics)  Answer Assessment - Initial Assessment Questions 1. DESCRIPTION: "Describe how you are feeling."     States she is feeling normal now, felt fatigued and low energy on Thursday of last week 2. SEVERITY: "How bad is it?"  "Can you stand and walk?"   - MILD (0-3): Feels weak or tired, but does not interfere with  work, school or normal activities.   - MODERATE (4-7): Able to stand and walk; weakness interferes with work, school, or normal activities.   - SEVERE (8-10): Unable to stand or walk; unable to do usual activities.     States the "episode" did not interfere with her walking/activity  3. ONSET: "When did these symptoms begin?" (e.g., hours, days, weeks, months)     States she had an episode where she felt really tired and had no energy on Thursday when she was out to lunch with a friend, states she has been walking normally everyday 4. CAUSE: "What do you think is causing the weakness or fatigue?" (e.g., not drinking enough fluids, medical problem, trouble sleeping)     Potentially something with her heart or medications, states she had an appointment with her cardiologist on 3/10 and everything looked normal  5. NEW MEDICINES:  "Have you started on any new medicines recently?" (e.g., opioid pain medicines, benzodiazepines, muscle relaxants, antidepressants, antihistamines, neuroleptics, beta blockers)     Denies 6. OTHER SYMPTOMS: "Do you have any other symptoms?" (e.g., chest pain, fever, cough, SOB, vomiting, diarrhea, bleeding, other areas of pain)     Lack of appetite, denies weakness on one side of the body, denies chest pain, denies SOB, denies dizziness, denies headache  Protocols used: Weakness (Generalized) and Fatigue-A-AH

## 2023-07-12 ENCOUNTER — Encounter: Payer: Self-pay | Admitting: Internal Medicine

## 2023-07-12 ENCOUNTER — Ambulatory Visit (INDEPENDENT_AMBULATORY_CARE_PROVIDER_SITE_OTHER): Admitting: Internal Medicine

## 2023-07-12 ENCOUNTER — Ambulatory Visit (INDEPENDENT_AMBULATORY_CARE_PROVIDER_SITE_OTHER)

## 2023-07-12 ENCOUNTER — Other Ambulatory Visit: Payer: Self-pay

## 2023-07-12 VITALS — BP 116/68 | HR 80 | Temp 98.1°F | Resp 16 | Ht 60.0 in | Wt 119.0 lb

## 2023-07-12 DIAGNOSIS — R5382 Chronic fatigue, unspecified: Secondary | ICD-10-CM | POA: Diagnosis not present

## 2023-07-12 DIAGNOSIS — Z0001 Encounter for general adult medical examination with abnormal findings: Secondary | ICD-10-CM | POA: Diagnosis not present

## 2023-07-12 DIAGNOSIS — I10 Essential (primary) hypertension: Secondary | ICD-10-CM

## 2023-07-12 DIAGNOSIS — R052 Subacute cough: Secondary | ICD-10-CM

## 2023-07-12 DIAGNOSIS — R1319 Other dysphagia: Secondary | ICD-10-CM | POA: Diagnosis not present

## 2023-07-12 DIAGNOSIS — R10811 Right upper quadrant abdominal tenderness: Secondary | ICD-10-CM

## 2023-07-12 DIAGNOSIS — R748 Abnormal levels of other serum enzymes: Secondary | ICD-10-CM | POA: Diagnosis not present

## 2023-07-12 DIAGNOSIS — M810 Age-related osteoporosis without current pathological fracture: Secondary | ICD-10-CM

## 2023-07-12 DIAGNOSIS — I3481 Nonrheumatic mitral (valve) annulus calcification: Secondary | ICD-10-CM | POA: Diagnosis not present

## 2023-07-12 DIAGNOSIS — E559 Vitamin D deficiency, unspecified: Secondary | ICD-10-CM | POA: Diagnosis not present

## 2023-07-12 DIAGNOSIS — K219 Gastro-esophageal reflux disease without esophagitis: Secondary | ICD-10-CM

## 2023-07-12 DIAGNOSIS — I480 Paroxysmal atrial fibrillation: Secondary | ICD-10-CM | POA: Diagnosis not present

## 2023-07-12 DIAGNOSIS — N1832 Chronic kidney disease, stage 3b: Secondary | ICD-10-CM | POA: Insufficient documentation

## 2023-07-12 DIAGNOSIS — R059 Cough, unspecified: Secondary | ICD-10-CM | POA: Diagnosis not present

## 2023-07-12 LAB — CBC WITH DIFFERENTIAL/PLATELET
Basophils Absolute: 0.1 10*3/uL (ref 0.0–0.1)
Basophils Relative: 0.5 % (ref 0.0–3.0)
Eosinophils Absolute: 0 10*3/uL (ref 0.0–0.7)
Eosinophils Relative: 0.4 % (ref 0.0–5.0)
HCT: 40.2 % (ref 36.0–46.0)
Hemoglobin: 13.7 g/dL (ref 12.0–15.0)
Lymphocytes Relative: 25.1 % (ref 12.0–46.0)
Lymphs Abs: 2.6 10*3/uL (ref 0.7–4.0)
MCHC: 34 g/dL (ref 30.0–36.0)
MCV: 91.5 fl (ref 78.0–100.0)
Monocytes Absolute: 1.2 10*3/uL — ABNORMAL HIGH (ref 0.1–1.0)
Monocytes Relative: 11 % (ref 3.0–12.0)
Neutro Abs: 6.6 10*3/uL (ref 1.4–7.7)
Neutrophils Relative %: 63 % (ref 43.0–77.0)
Platelets: 236 10*3/uL (ref 150.0–400.0)
RBC: 4.39 Mil/uL (ref 3.87–5.11)
RDW: 13.1 % (ref 11.5–15.5)
WBC: 10.5 10*3/uL (ref 4.0–10.5)

## 2023-07-12 LAB — PHOSPHORUS: Phosphorus: 4.2 mg/dL (ref 2.3–4.6)

## 2023-07-12 LAB — BASIC METABOLIC PANEL
BUN: 9 mg/dL (ref 6–23)
CO2: 26 meq/L (ref 19–32)
Calcium: 10 mg/dL (ref 8.4–10.5)
Chloride: 96 meq/L (ref 96–112)
Creatinine, Ser: 0.76 mg/dL (ref 0.40–1.20)
GFR: 75.18 mL/min (ref >60.00)
Glucose, Bld: 94 mg/dL (ref 70–99)
Potassium: 4.7 meq/L (ref 3.5–5.1)
Sodium: 130 meq/L — ABNORMAL LOW (ref 135–145)

## 2023-07-12 LAB — URINALYSIS, ROUTINE W REFLEX MICROSCOPIC
Bilirubin Urine: NEGATIVE
Hgb urine dipstick: NEGATIVE
Leukocytes,Ua: NEGATIVE
Nitrite: NEGATIVE
RBC / HPF: NONE SEEN (ref 0–?)
Specific Gravity, Urine: 1.025 (ref 1.000–1.030)
Total Protein, Urine: NEGATIVE
Urine Glucose: NEGATIVE
Urobilinogen, UA: 0.2 (ref 0.0–1.0)
pH: 5.5 (ref 5.0–8.0)

## 2023-07-12 LAB — HEPATIC FUNCTION PANEL
ALT: 20 U/L (ref 0–35)
AST: 30 U/L (ref 0–37)
Albumin: 4.1 g/dL (ref 3.5–5.2)
Alkaline Phosphatase: 106 U/L (ref 39–117)
Bilirubin, Direct: 0.1 mg/dL (ref 0.0–0.3)
Total Bilirubin: 0.6 mg/dL (ref 0.2–1.2)
Total Protein: 8.4 g/dL — ABNORMAL HIGH (ref 6.0–8.3)

## 2023-07-12 LAB — VITAMIN D 25 HYDROXY (VIT D DEFICIENCY, FRACTURES): VITD: 15.03 ng/mL — ABNORMAL LOW (ref 30.00–100.00)

## 2023-07-12 LAB — LIPASE: Lipase: 89 U/L — ABNORMAL HIGH (ref 11.0–59.0)

## 2023-07-12 MED ORDER — DENOSUMAB 60 MG/ML ~~LOC~~ SOSY
60.0000 mg | PREFILLED_SYRINGE | Freq: Once | SUBCUTANEOUS | Status: AC
  Administered 2023-07-12: 60 mg via SUBCUTANEOUS

## 2023-07-12 MED ORDER — DENOSUMAB 60 MG/ML ~~LOC~~ SOSY
60.0000 mg | PREFILLED_SYRINGE | Freq: Once | SUBCUTANEOUS | Status: AC
Start: 2024-01-12 — End: 2024-02-16
  Administered 2024-02-16: 60 mg via SUBCUTANEOUS

## 2023-07-12 MED ORDER — CHOLECALCIFEROL 50 MCG (2000 UT) PO TABS: 1.0000 | ORAL_TABLET | Freq: Every day | ORAL | 0 refills | Status: AC

## 2023-07-12 NOTE — Patient Instructions (Addendum)
 Lipase (enzyme from pancreas) is elevated. CT scan ordered to evaluate the pancreas.  Vit D is low. Please take Vit D 2000 units/day with meal or small glass of whole milk   Health Maintenance, Female Adopting a healthy lifestyle and getting preventive care are important in promoting health and wellness. Ask your health care provider about: The right schedule for you to have regular tests and exams. Things you can do on your own to prevent diseases and keep yourself healthy. What should I know about diet, weight, and exercise? Eat a healthy diet  Eat a diet that includes plenty of vegetables, fruits, low-fat dairy products, and lean protein. Do not eat a lot of foods that are high in solid fats, added sugars, or sodium. Maintain a healthy weight Body mass index (BMI) is used to identify weight problems. It estimates body fat based on height and weight. Your health care provider can help determine your BMI and help you achieve or maintain a healthy weight. Get regular exercise Get regular exercise. This is one of the most important things you can do for your health. Most adults should: Exercise for at least 150 minutes each week. The exercise should increase your heart rate and make you sweat (moderate-intensity exercise). Do strengthening exercises at least twice a week. This is in addition to the moderate-intensity exercise. Spend less time sitting. Even light physical activity can be beneficial. Watch cholesterol and blood lipids Have your blood tested for lipids and cholesterol at 78 years of age, then have this test every 5 years. Have your cholesterol levels checked more often if: Your lipid or cholesterol levels are high. You are older than 78 years of age. You are at high risk for heart disease. What should I know about cancer screening? Depending on your health history and family history, you may need to have cancer screening at various ages. This may include screening  for: Breast cancer. Cervical cancer. Colorectal cancer. Skin cancer. Lung cancer. What should I know about heart disease, diabetes, and high blood pressure? Blood pressure and heart disease High blood pressure causes heart disease and increases the risk of stroke. This is more likely to develop in people who have high blood pressure readings or are overweight. Have your blood pressure checked: Every 3-5 years if you are 36-29 years of age. Every year if you are 20 years old or older. Diabetes Have regular diabetes screenings. This checks your fasting blood sugar level. Have the screening done: Once every three years after age 69 if you are at a normal weight and have a low risk for diabetes. More often and at a younger age if you are overweight or have a high risk for diabetes. What should I know about preventing infection? Hepatitis B If you have a higher risk for hepatitis B, you should be screened for this virus. Talk with your health care provider to find out if you are at risk for hepatitis B infection. Hepatitis C Testing is recommended for: Everyone born from [birth year] through 1965. Anyone with known risk factors for hepatitis C. Sexually transmitted infections (STIs) Get screened for STIs, including gonorrhea and chlamydia, if: You are sexually active and are younger than 78 years of age. You are older than 78 years of age and your health care provider tells you that you are at risk for this type of infection. Your sexual activity has changed since you were last screened, and you are at increased risk for chlamydia or gonorrhea. Ask your health  care provider if you are at risk. Ask your health care provider about whether you are at high risk for HIV. Your health care provider may recommend a prescription medicine to help prevent HIV infection. If you choose to take medicine to prevent HIV, you should first get tested for HIV. You should then be tested every 3 months for as long as you  are taking the medicine. Pregnancy If you are about to stop having your period (premenopausal) and you may become pregnant, seek counseling before you get pregnant. Take 400 to 800 micrograms (mcg) of folic acid every day if you become pregnant. Ask for birth control (contraception) if you want to prevent pregnancy. Osteoporosis and menopause Osteoporosis is a disease in which the bones lose minerals and strength with aging. This can result in bone fractures. If you are 4 years old or older, or if you are at risk for osteoporosis and fractures, ask your health care provider if you should: Be screened for bone loss. Take a calcium or vitamin D supplement to lower your risk of fractures. Be given hormone replacement therapy (HRT) to treat symptoms of menopause. Follow these instructions at home: Alcohol use Do not drink alcohol if: Your health care provider tells you not to drink. You are pregnant, may be pregnant, or are planning to become pregnant. If you drink alcohol: Limit how much you have to: 0-1 drink a day. Know how much alcohol is in your drink. In the [place] one drink equals one 12 oz bottle of beer (355 mL), one 5 oz glass of wine (148 mL), or one 1 oz glass of hard liquor (44 mL). Lifestyle Do not use any products that contain nicotine or tobacco. These products include cigarettes, chewing tobacco, and vaping devices, such as e-cigarettes. If you need help quitting, ask your health care provider. Do not use street drugs. Do not share needles. Ask your health care provider for help if you need support or information about quitting drugs. General instructions Schedule regular health, dental, and eye exams. Stay current with your vaccines. Tell your health care provider if: You often feel depressed. You have ever been abused or do not feel safe at home. Summary Adopting a healthy lifestyle and getting preventive care are important in promoting health and wellness. Follow your  health care provider's instructions about healthy diet, exercising, and getting tested or screened for diseases. Follow your health care provider's instructions on monitoring your cholesterol and blood pressure. This information is not intended to replace advice given to you by your health care provider. Make sure you discuss any questions you have with your health care provider. Document Revised: 09/01/2020 Document Reviewed: 09/01/2020 [employer]vier Patient Education  2024 [employer].

## 2023-07-12 NOTE — Progress Notes (Unsigned)
 Subjective:  Patient ID: Carrie [name], female    DOB: Sep 16, 1945  Age: 78 y.o. MRN: [id number]  CC: Annual Exam and Abdominal Pain   HPI Carrie [name] presents for a CPX and f/up ----  Discussed the use of AI scribe software for clinical note transcription with the patient, who gave verbal consent to proceed.  History of Present Illness   Carrie [name] is a 78 year old female who presents with fatigue and loss of appetite. She is accompanied by a family member who observed her symptoms.  For the past month, she has experienced significant fatigue and a lack of appetite, leading to noticeable weight loss. There is no nausea, vomiting, abdominal pain, diarrhea, or constipation, but she has a complete lack of desire to eat.  She has recently developed difficulty swallowing, particularly with pills, which she attributes to mucus in her throat. A family member noted an incident where she appeared extremely tired and had difficulty speaking due to excessive saliva.  She has a constantly running nose, which she associates with allergies, and she takes Benadryl for relief. No bleeding, bruising, or abdominal pain is present, except for some tenderness in the right upper quadrant. Bowel movements are normal. She does not take any medications for heartburn or indigestion and only uses Tylenol occasionally for pain.       Outpatient Medications Prior to Visit  Medication Sig Dispense Refill   denosumab (PROLIA) 60 MG/ML SOSY injection Inject 60 mg into the skin every 6 (six) months. 60 mL 1   ELIQUIS 5 MG TABS tablet Take 1 tablet (5 mg total) by mouth 2 (two) times daily. 180 tablet 3   ezetimibe (ZETIA) 10 MG tablet Take 1 tablet (10 mg total) by mouth daily. 90 tablet 3   KLOR-CON M10 10 MEQ tablet TAKE 1 TABLET BY MOUTH TWICE A DAY 180 tablet 0   metoprolol succinate (TOPROL-XL) 50 MG 24 hr tablet TAKE 1 TABLET BY MOUTH EVERY DAY WITH OR IMMEDIATELY FOLLOWING A  MEAL 90 tablet 3   vitamin B-12 (CYANOCOBALAMIN) 1000 MCG tablet Take 1,000 mcg by mouth daily.     Cholecalciferol (VITAMIN D3) 1000 UNITS CAPS Take 1 capsule by mouth daily.     amoxicillin (AMOXIL) 500 MG tablet Take 4 tablets (2,000 mg total) by mouth as directed. 1 hour prior to dental work including cleanings (Patient not taking: Reported on 07/12/2023) 12 tablet 12   rosuvastatin (CRESTOR) 20 MG tablet Take 1 tablet (20 mg total) by mouth daily. (Patient not taking: Reported on 07/12/2023) 90 tablet 2   traMADol (ULTRAM) 50 MG tablet Take 50 mg by mouth every 6 (six) hours as needed for moderate pain (pain score 4-6) or severe pain (pain score 7-10). (Patient not taking: Reported on 07/12/2023)     No facility-administered medications prior to visit.    ROS Review of Systems  Constitutional:  Positive for appetite change and fatigue. Negative for activity change, chills, diaphoresis and unexpected weight change.  HENT:  Positive for trouble swallowing. Negative for sore throat and voice change.   Respiratory:  Positive for cough. Negative for choking, chest tightness, shortness of breath and wheezing.   Cardiovascular:  Negative for chest pain, palpitations and leg swelling.  Gastrointestinal:  Positive for abdominal pain. Negative for anal bleeding, blood in stool, constipation, diarrhea, nausea and vomiting.  Genitourinary:  Negative for difficulty urinating and dysuria.  Musculoskeletal: Negative.   Skin: Negative.   Neurological:  Negative for dizziness,  speech difficulty, weakness and light-headedness.  Hematological:  Negative for adenopathy. Does not bruise/bleed easily.  Psychiatric/Behavioral:  Positive for confusion and decreased concentration.     Objective:  BP 116/68 (BP Location: Left Arm, Patient Position: Sitting, Cuff Size: Normal)   Pulse 80   Temp 98.1 F (36.7 C) (Oral)   Resp 16   Ht 5' (1.524 m)   Wt 119 lb (54 kg)   SpO2 94%   BMI 23.24 kg/m   BP  Readings from Last 3 Encounters:  07/12/23 116/68  06/28/23 110/60  11/12/22 132/78    Wt Readings from Last 3 Encounters:  07/12/23 119 lb (54 kg)  06/28/23 118 lb 9.6 oz (53.8 kg)  11/12/22 120 lb (54.4 kg)    Physical Exam Vitals reviewed.  Constitutional:      General: She is not in acute distress.    Appearance: Normal appearance. She is not ill-appearing, toxic-appearing or diaphoretic.  HENT:     Nose: Nose normal.     Mouth/Throat:     Mouth: Mucous membranes are moist.  Eyes:     General: No scleral icterus.    Conjunctiva/sclera: Conjunctivae normal.  Cardiovascular:     Rate and Rhythm: Normal rate and regular rhythm.     Heart sounds: Murmur heard.     Systolic murmur is present with a grade of 3/6.     No friction rub. No gallop.  Pulmonary:     Effort: Pulmonary effort is normal.     Breath sounds: No stridor. No wheezing, rhonchi or rales.  Abdominal:     General: Abdomen is flat. Bowel sounds are normal. There is no distension.     Palpations: Abdomen is soft. There is no hepatomegaly, splenomegaly or mass.     Tenderness: There is abdominal tenderness in the right upper quadrant. There is no guarding or rebound.     Hernia: No hernia is present.  Musculoskeletal:        General: Normal range of motion.     Cervical back: Neck supple.     Right lower leg: No edema.     Left lower leg: No edema.  Lymphadenopathy:     Cervical: No cervical adenopathy.  Skin:    General: Skin is dry.     Coloration: Skin is not pale.  Neurological:     General: No focal deficit present.     Mental Status: She is alert.  Psychiatric:        Mood and Affect: Mood normal.        Behavior: Behavior normal.     Lab Results  Component Value Date   WBC 10.5 07/12/2023   HGB 13.7 07/12/2023   HCT 40.2 07/12/2023   PLT 236.0 07/12/2023   GLUCOSE 94 07/12/2023   CHOL 151 10/07/2022   TRIG 129 10/07/2022   HDL 72 10/07/2022   LDLDIRECT 133.8 12/19/2012   LDLCALC 57  10/07/2022   ALT 20 07/12/2023   AST 30 07/12/2023   NA 130 (L) 07/12/2023   K 4.7 07/12/2023   CL 96 07/12/2023   CREATININE 0.76 07/12/2023   BUN 9 07/12/2023   CO2 26 07/12/2023   TSH 1.11 07/12/2023   INR 1.4 (H) 11/17/2021   HGBA1C 5.4 11/17/2021    ECHOCARDIOGRAM COMPLETE Result Date: 09/09/2021    ECHOCARDIOGRAM REPORT   Patient Name:   Yaire SPRINKLE Ratterman Date of Exam: 09/09/2021 Medical Rec #:  [id number]  Height:       61.0 in Accession #:    [id number]              Weight:       129.2 lb Date of Birth:  02-21-1946                BSA:          1.569 m Patient Age:    76 years                BP:           114/64 mmHg Patient Gender: F                       HR:           93 bpm. Exam Location:  Inpatient Procedure: 2D Echo, Cardiac Doppler and Color Doppler Indications:    Post TAVR evaluation V43.3 / Z95.2  History:        Patient has prior history of Echocardiogram examinations, most                 recent 09/08/2021. CAD, Aortic Valve Disease, Arrythmias:Atrial                 Flutter; Risk Factors:Former Smoker. HOCM (hypertrophic                 obstructive cardiomyopathy).                 Aortic Valve: 26 mm Sapien prosthetic, stented (TAVR) valve is                 present in the aortic position.  Sonographer:    Leta Jungling RDCS Referring Phys: [id number] KATHRYN R THOMPSON IMPRESSIONS  1. Left ventricular ejection fraction, by estimation, is 70 to 75%. The left ventricle has hyperdynamic function. The left ventricle has no regional wall motion abnormalities. There is moderate asymmetric left ventricular hypertrophy of the basal-septal  segment. Left ventricular diastolic parameters are consistent with Grade I diastolic dysfunction (impaired relaxation).  2. Right ventricular systolic function is normal. The right ventricular size is normal. There is mildly elevated pulmonary artery systolic pressure.  3. Left atrial size was severely dilated.  4. There is at most mild  mitral stenosis. Mitral gradients are high due to tachycardia and severe mitral insufficiency. There is moderate to severe systolic anterior motion of the mitral valve with secondary mitral insufficiency and LV outflow obstruction. The mitral valve is degenerative. Severe mitral valve regurgitation. The mean mitral valve gradient is 8.5 mmHg with average heart rate of 105 bpm. Severe mitral annular calcification.  5. Aortic valve gradients and calculated aortic valve area are inaccurate due to subvalvular dynamic obstruction due to HOCM pathophysiology. Dimensionless valve index is consistent with normal prosthetic valve function.. The aortic valve has been repaired/replaced. Aortic valve regurgitation is not visualized. There is a 26 mm Sapien prosthetic (TAVR) valve present in the aortic position. Echo findings are consistent with normal structure and function of the aortic valve prosthesis. Aortic valve Vmax measures 3.30 m/s.  6. The inferior vena cava is normal in size with greater than 50% respiratory variability, suggesting right atrial pressure of 3 mmHg. Comparison(s): The degree of SAM of the mitral valve is worse, with increased severity of MR and increased subvalvular gradient (likely due to post-TAVR afterload reduction and tachycardia). FINDINGS  Left Ventricle: Left ventricular ejection fraction, by estimation, is 70 to 75%. The  left ventricle has hyperdynamic function. The left ventricle has no regional wall motion abnormalities. The left ventricular internal cavity size was normal in size. There is moderate asymmetric left ventricular hypertrophy of the basal-septal segment. Left ventricular diastolic parameters are consistent with Grade I diastolic dysfunction (impaired relaxation). Indeterminate filling pressures. Right Ventricle: The right ventricular size is normal. No increase in right ventricular wall thickness. Right ventricular systolic function is normal. There is mildly elevated pulmonary  artery systolic pressure. The tricuspid regurgitant velocity is 2.85  m/s, and with an assumed right atrial pressure of 3 mmHg, the estimated right ventricular systolic pressure is 35.5 mmHg. Left Atrium: Left atrial size was severely dilated. Right Atrium: Right atrial size was normal in size. Pericardium: There is no evidence of pericardial effusion. Mitral Valve: There is at most mild mitral stenosis. Mitral gradients are high due to tachycardia and severe mitral insufficiency. There is moderate to severe systolic anterior motion of the mitral valve with secondary mitral insufficiency and LV outflow  obstruction. The mitral valve is degenerative in appearance. Severe mitral annular calcification. Severe mitral valve regurgitation. MV peak gradient, 15.2 mmHg. The mean mitral valve gradient is 8.5 mmHg with average heart rate of 105 bpm. Tricuspid Valve: The tricuspid valve is normal in structure. Tricuspid valve regurgitation is mild. Aortic Valve: Aortic valve gradients and calculated aortic valve area are inaccurate due to subvalvular dynamic obstruction due to HOCM pathophysiology. Dimensionless valve index is consistent with normal prosthetic valve function. The aortic valve has been repaired/replaced. Aortic valve regurgitation is not visualized. Aortic valve mean gradient measures 21.5 mmHg. Aortic valve peak gradient measures 43.4 mmHg. Aortic valve area, by VTI measures 1.68 cm. There is a 26 mm Sapien prosthetic, stented (TAVR) valve present in the aortic position. Echo findings are consistent with normal structure and function of the aortic valve prosthesis. Pulmonic Valve: The pulmonic valve was grossly normal. Pulmonic valve regurgitation is not visualized. Aorta: The aortic root is normal in size and structure. Venous: A pattern of systolic flow reversal, suggestive of severe mitral regurgitation is recorded from the right upper pulmonary vein and the left upper pulmonary vein. The inferior vena  cava is normal in size with greater than 50% respiratory variability, suggesting right atrial pressure of 3 mmHg. IAS/Shunts: No atrial level shunt detected by color flow Doppler.  LEFT VENTRICLE PLAX 2D LVIDd:         3.50 cm LVIDs:         2.60 cm LV PW:         1.20 cm LV IVS:        1.70 cm LVOT diam:     2.00 cm LV SV:         92 LV SV Index:   59 LVOT Area:     3.14 cm  RIGHT VENTRICLE RV S prime:     17.40 cm/s TAPSE (M-mode): 2.0 cm LEFT ATRIUM             Index        RIGHT ATRIUM          Index LA diam:        3.90 cm 2.49 cm/m   RA Area:     7.50 cm LA Vol (A2C):   61.5 ml 39.21 ml/m  RA Volume:   11.50 ml 7.33 ml/m LA Vol (A4C):   80.6 ml 51.38 ml/m LA Biplane Vol: 74.6 ml 47.56 ml/m  AORTIC VALVE  PULMONIC VALVE AV Area (Vmax):    1.57 cm      PV Vmax:       2.88 m/s AV Area (Vmean):   1.61 cm      PV Peak grad:  33.2 mmHg AV Area (VTI):     1.68 cm AV Vmax:           329.50 cm/s AV Vmean:          210.250 cm/s AV VTI:            0.549 m AV Peak Grad:      43.4 mmHg AV Mean Grad:      21.5 mmHg LVOT Vmax:         165.00 cm/s LVOT Vmean:        108.000 cm/s LVOT VTI:          0.294 m LVOT/AV VTI ratio: 0.54 MITRAL VALVE                  TRICUSPID VALVE MV Area (PHT): 3.34 cm       TR Peak grad:   32.5 mmHg MV Area VTI:   2.84 cm       TR Vmax:        285.00 cm/s MV Peak grad:  15.2 mmHg MV Mean grad:  8.5 mmHg       SHUNTS MV Vmax:       1.95 m/s       Systemic VTI:  0.29 m MV Vmean:      137.5 cm/s     Systemic Diam: 2.00 cm MV Decel Time: 227 msec MR Peak grad:    171.6 mmHg MR Mean grad:    132.0 mmHg MR Vmax:         655.00 cm/s MR Vmean:        565.0 cm/s MR PISA:         5.09 cm MR PISA Eff ROA: 36 mm MR PISA Radius:  0.90 cm MV E velocity: 142.00 cm/s Rachelle Hora Croitoru MD Electronically signed by Thurmon Fair MD Signature Date/Time: 09/09/2021/12:26:35 PM    Final    ECHOCARDIOGRAM LIMITED Result Date: 09/08/2021    ECHOCARDIOGRAM LIMITED REPORT   Patient Name:    [facility] SPRINKLE Camire Date of Exam: 09/08/2021 Medical Rec #:  [id number]               Height:       61.0 in Accession #:    [id number]              Weight:       120.0 lb Date of Birth:  1945-09-12                BSA:          1.520 m Patient Age:    76 years                BP:           172/57 mmHg Patient Gender: F                       HR:           62 bpm. Exam Location:  Inpatient Procedure: Limited Echo, Cardiac Doppler and Color Doppler Indications:    Aortic stenosis I35.0  History:        Patient has prior history of Echocardiogram examinations, most  recent 06/22/2021. CAD, Aortic Valve Disease and Mitral Valve                 Disease, Arrythmias:Atrial Flutter, Signs/Symptoms:Murmur; Risk                 Factors:Former Smoker. HOCM.                 Aortic Valve: 26 mm stented (TAVR) valve is present in the                 aortic position. Procedure Date: 09/08/2021.  Sonographer:    Leta Jungling RDCS Referring Phys: [phone] MICHAEL Pullman                  PREOPERATIVE FINDINGS                  Normal left ventricular regional wall motion and overall                 systolic function. Estimated LVEF 65%.                 Trileaflet aortic valve with severe calcific stenosis.                 Moderate aortic insufficiency.                 Peak aortic valve gradient 70 mm Hg, mean gradient 47 mm Hg,                 dimensionless valve index 0.27, calculated valve area 0.75 cm sq                 (0.5 cm sq/m sq indexed for BSA).                 Moderate-to-severe centrally-directed mitral insufficiency.                 No pericardial effusion.                  PREOPERATIVE FINDINGS                  Hyperdynamic left ventricular systolic function. Estimated LVEF                 65-70%.                 There is systolic anterior motion of the mitral valve, but with                 very mild LVOT obstruction.                 Well-seated Medtronic stent-valve (TAVR). The valve was                  reexpanded due to moderate perivalvular leak. There appears to                 be only a trivial leak at the end of the procedure.                 Peak aortic valve gradient 13 mm Hg, mean gradient 7 mm Hg.                 Unchanged moderate to severe mitral insufficiency.                 No pericardial effusion. IMPRESSIONS  1. Left ventricular ejection fraction, by estimation, is 60 to 65%. The left  ventricle has normal function. The left ventricle has no regional wall motion abnormalities. There is moderate concentric left ventricular hypertrophy.  2. Right ventricular systolic function is normal. The right ventricular size is normal.  3. The mitral valve is degenerative. Moderate to severe mitral valve regurgitation. Severe mitral annular calcification.  4. The aortic valve has been repaired/replaced. There is a 26 mm stented (TAVR) valve present in the aortic position. Procedure Date: 09/08/2021. FINDINGS  Left Ventricle: Left ventricular ejection fraction, by estimation, is 60 to 65%. The left ventricle has normal function. The left ventricle has no regional wall motion abnormalities. The left ventricular internal cavity size was normal in size. There is  moderate concentric left ventricular hypertrophy. Right Ventricle: The right ventricular size is normal. No increase in right ventricular wall thickness. Right ventricular systolic function is normal. Pericardium: There is no evidence of pericardial effusion. Mitral Valve: The mitral valve is degenerative in appearance. Severe mitral annular calcification. Moderate to severe mitral valve regurgitation. Aortic Valve: The aortic valve has been repaired/replaced. Aortic regurgitation PHT measures 241 msec. Aortic valve mean gradient measures 7.0 mmHg. Aortic valve peak gradient measures 13.1 mmHg. Aortic valve area, by VTI measures 3.16 cm. There is a 26  mm stented (TAVR) valve present in the aortic position. Procedure Date: 09/08/2021. LEFT VENTRICLE PLAX 2D  LVOT diam:     1.86 cm LV SV:         147 LV SV Index:   97 LVOT Area:     2.72 cm  AORTIC VALVE AV Area (Vmax):    2.96 cm AV Area (Vmean):   2.96 cm AV Area (VTI):     3.16 cm AV Vmax:           181.00 cm/s AV Vmean:          124.000 cm/s AV VTI:            0.465 m AV Peak Grad:      13.1 mmHg AV Mean Grad:      7.0 mmHg LVOT Vmax:         197.00 cm/s LVOT Vmean:        135.000 cm/s LVOT VTI:          0.540 m LVOT/AV VTI ratio: 1.16 AI PHT:            241 msec MR Peak grad:    215.5 mmHg MR Mean grad:    129.0 mmHg   SHUNTS MR Vmax:         734.00 cm/s  Systemic VTI:  0.54 m MR Vmean:        534.0 cm/s   Systemic Diam: 1.86 cm MR PISA:         1.57 cm MR PISA Eff ROA: 5 mm MR PISA Radius:  0.50 cm Rachelle Hora Croitoru MD Electronically signed by Thurmon Fair MD Signature Date/Time: 09/08/2021/5:31:38 PM    Final    Structural Heart Procedure Result Date: 09/08/2021 See surgical note for result.    Assessment & Plan:  Right upper quadrant abdominal tenderness without rebound tenderness -     Urinalysis, Routine w reflex microscopic; Future -     Hepatic function panel; Future -     Lipase; Future -     CT ABDOMEN PELVIS W CONTRAST; Future  Essential hypertension -     Thyroid Panel With TSH; Future -     Urinalysis, Routine w reflex microscopic; Future -     Basic metabolic panel; Future  GERD without esophagitis -     CBC with Differential/Platelet; Future  Esophageal dysphagia -     Ambulatory referral to Gastroenterology  Paroxysmal atrial fibrillation [facility])- She has good R/R control. -     Thyroid Panel With TSH; Future  Chronic fatigue -     Thyroid Panel With TSH; Future -     Basic metabolic panel; Future  Age-related osteoporosis without current pathological fracture -     Phosphorus; Future -     VITAMIN D 25 Hydroxy (Vit-D Deficiency, Fractures); Future -     Basic metabolic panel; Future -     Denosumab  Subacute cough -     DG Chest 2 View; Future  Encounter for  general adult medical examination with abnormal findings- Exam completed, labs reviewed, vaccines reviewed, no cancer screenings indicated, pt ed material was given.   Elevated lipase- Will evaluate for pancreatitis/pancreatic cancer. -     CT ABDOMEN PELVIS W CONTRAST; Future  Vitamin D deficiency disease  Other orders -     Cholecalciferol; Take 1 tablet (2,000 Units total) by mouth daily.  Dispense: 90 tablet; Refill: 0     Follow-up: Return in about 3 months (around 10/12/2023).  Sanda Linger, MD

## 2023-07-13 LAB — THYROID PANEL WITH TSH
Free Thyroxine Index: 2.7 (ref 1.4–3.8)
T3 Uptake: 26 % (ref 22–35)
T4, Total: 10.2 ug/dL (ref 5.1–11.9)
TSH: 1.11 m[IU]/L (ref 0.40–4.50)

## 2023-07-15 ENCOUNTER — Telehealth: Payer: Self-pay | Admitting: Internal Medicine

## 2023-07-15 NOTE — Telephone Encounter (Signed)
 Copied from CRM [id number]. Topic: General - Other >> Jul 15, 2023  2:03 PM Turkey A wrote: Reason for CRM: [facility] called  requesting Prior Authorization CPT [phone] call back number [phone] ext [phone]

## 2023-07-18 ENCOUNTER — Other Ambulatory Visit

## 2023-07-18 ENCOUNTER — Ambulatory Visit: Payer: Medicare HMO

## 2023-07-18 ENCOUNTER — Ambulatory Visit

## 2023-07-18 ENCOUNTER — Telehealth: Payer: Self-pay | Admitting: Internal Medicine

## 2023-07-18 NOTE — Telephone Encounter (Signed)
 Copied from CRM [id number]. Topic: General - Other >> Jul 18, 2023  9:03 AM Isabell A wrote: Reason for CRM: [facility] calling to let the provider know there was no authorization on file, patients appointment scheduled for tomorrow has been cancelled.   Callback number:  Claris Gower [phone] ext [phone]

## 2023-07-18 NOTE — Telephone Encounter (Signed)
 Copied from CRM [id number]. Topic: General - Other >> Jul 18, 2023  2:22 PM Carrie [name] wrote: Reason for CRM: Prolia >> Jul 18, 2023  2:25 PM Carrie [name] wrote: Patient is calling in, requesting to receive a copy of her receipt from her prolia injection mailed out to her. She's needing prrof that she paid for it   ---  Has pt received her shot yet? Several of her appointments were canceled

## 2023-07-18 NOTE — Telephone Encounter (Signed)
 Please help.

## 2023-07-19 ENCOUNTER — Inpatient Hospital Stay: Admission: RE | Admit: 2023-07-19 | Source: Ambulatory Visit

## 2023-07-19 ENCOUNTER — Other Ambulatory Visit: Payer: Self-pay | Admitting: Internal Medicine

## 2023-07-19 NOTE — Telephone Encounter (Signed)
 Copied from CRM [id number]. Topic: Referral - Status >> Jul 19, 2023 10:49 AM Gurney Maxin H wrote: Reason for CRM: Patient is calling to check status of referral for CT scan states appointment was cancelled due to office needing to send a prior authorization to insurance company, please reach out to patient, thanks.  Ndeye [phone]

## 2023-07-19 NOTE — Telephone Encounter (Signed)
 Copied from CRM [id number]. Topic: General - Other >> Jul 18, 2023  9:03 AM Isabell A wrote: Reason for CRM: [facility] calling to let the provider know there was no authorization on file, patients appointment scheduled for tomorrow has been cancelled.   Callback number:  Claris Gower [phone] ext [phone] >> Jul 18, 2023  4:31 PM Denese Killings wrote: Patient is calling to check on prior Authorization for CT. She stated that they cancelled her appointment for tomorrow morning. Patient is requesting a callback.

## 2023-07-19 NOTE — Telephone Encounter (Signed)
 This has been handled in another telephone call.

## 2023-07-19 NOTE — Telephone Encounter (Signed)
 Copied from CRM [id number]. Topic: General - Other >> Jul 19, 2023  7:51 AM Turkey A wrote: Reason for CRM: Patient called to ask when would the Prior Authorization be completed because the appointment with [facility] was cancelled for today >> Jul 19, 2023  8:46 AM Jon Gills C wrote: Patient called in stating she would like a status of the prior auth for her ct that was suppose to be for today. Is requesting someone to give her a callback about this issue

## 2023-07-19 NOTE — Telephone Encounter (Signed)
 Patient informed me that her CT was approved and scheduled.

## 2023-07-21 ENCOUNTER — Ambulatory Visit
Admission: RE | Admit: 2023-07-21 | Discharge: 2023-07-21 | Disposition: A | Discharge status: Home / Self Care | Source: Ambulatory Visit | Attending: Internal Medicine | Admitting: Internal Medicine

## 2023-07-21 DIAGNOSIS — R109 Unspecified abdominal pain: Secondary | ICD-10-CM | POA: Diagnosis not present

## 2023-07-21 DIAGNOSIS — R10811 Right upper quadrant abdominal tenderness: Secondary | ICD-10-CM

## 2023-07-21 DIAGNOSIS — R748 Abnormal levels of other serum enzymes: Secondary | ICD-10-CM

## 2023-07-21 DIAGNOSIS — K5730 Diverticulosis of large intestine without perforation or abscess without bleeding: Secondary | ICD-10-CM | POA: Diagnosis not present

## 2023-07-21 MED ORDER — IOPAMIDOL (ISOVUE-300) INJECTION 61%
100.0000 mL | Freq: Once | INTRAVENOUS | Status: AC | PRN
  Administered 2023-07-21: 100 mL via INTRAVENOUS

## 2023-07-22 ENCOUNTER — Telehealth: Payer: Self-pay | Admitting: Internal Medicine

## 2023-07-22 NOTE — Telephone Encounter (Signed)
 Advised patient that Dr. Yetta Barre hasn't made any comments on it. I will give her a call when he does and she gave a verbal understanding.

## 2023-07-22 NOTE — Telephone Encounter (Signed)
 Copied from CRM [id number]. Topic: Clinical - Lab/Test Results >> Jul 22, 2023 12:02 PM Martinique E wrote: Reason for CRM: Patient called to get the results of her CT scan that was completed yesterday 3/27. Callback number for patient is [phone] to discuss.

## 2023-07-26 ENCOUNTER — Encounter: Payer: Self-pay | Admitting: Internal Medicine

## 2023-07-28 ENCOUNTER — Encounter: Payer: Self-pay | Admitting: Physician Assistant

## 2023-09-30 ENCOUNTER — Ambulatory Visit: Admitting: Physician Assistant

## 2023-10-04 ENCOUNTER — Encounter: Payer: Self-pay | Admitting: Internal Medicine

## 2023-10-07 ENCOUNTER — Telehealth: Payer: Self-pay | Admitting: Internal Medicine

## 2023-10-07 ENCOUNTER — Other Ambulatory Visit: Payer: Self-pay

## 2023-10-07 MED ORDER — ROSUVASTATIN CALCIUM 20 MG PO TABS: 20.0000 mg | ORAL_TABLET | Freq: Every day | ORAL | 2 refills | Status: DC

## 2023-10-07 NOTE — Telephone Encounter (Signed)
 Per chart review, medication was refilled today.

## 2023-10-07 NOTE — Telephone Encounter (Signed)
 Copied from CRM [id number]. Topic: Clinical - Medication Refill >> Oct 07, 2023 11:35 AM Chuck Crater wrote: Medication: rosuvastatin  (CRESTOR ) 20 MG tablet   Has the patient contacted their pharmacy? Yes (Agent: If no, request that the patient contact the pharmacy for the refill. If patient does not wish to contact the pharmacy document the reason why and proceed with request.) (Agent: If yes, when and what did the pharmacy advise?) no response from doctor   This is the patient's preferred pharmacy:  [id number] - [place], [place] - [address] AT [facility] [address] [place] [address] Phone: [phone] Fax: [phone]  Is this the correct pharmacy for this prescription? Yes If no, delete pharmacy and type the correct one.   Has the prescription been filled recently? No  Is the patient out of the medication? No  Has the patient been seen for an appointment in the last year OR does the patient have an upcoming appointment? Yes  Can we respond through MyChart? no  Agent: Please be advised that Rx refills may take up to 3 business days. We ask that you follow-up with your pharmacy.

## 2023-10-12 ENCOUNTER — Other Ambulatory Visit: Payer: Self-pay

## 2023-10-12 DIAGNOSIS — I2510 Atherosclerotic heart disease of native coronary artery without angina pectoris: Secondary | ICD-10-CM

## 2023-10-12 MED ORDER — METOPROLOL SUCCINATE ER 50 MG PO TB24: 50.0000 mg | ORAL_TABLET | Freq: Every day | ORAL | 2 refills | Status: DC

## 2023-10-27 ENCOUNTER — Ambulatory Visit (INDEPENDENT_AMBULATORY_CARE_PROVIDER_SITE_OTHER): Payer: Medicare HMO

## 2023-10-27 VITALS — Ht 60.0 in | Wt 115.0 lb

## 2023-10-27 DIAGNOSIS — Z0000 Encounter for general adult medical examination without abnormal findings: Secondary | ICD-10-CM

## 2023-10-27 NOTE — Patient Instructions (Signed)
 Carrie [name] , Thank you for taking time out of your busy schedule to complete your Annual Wellness Visit with me. I enjoyed our conversation and look forward to speaking with you again next year. I, as well as your care team,  appreciate your ongoing commitment to your health goals. Please review the following plan we discussed and let me know if I can assist you in the future. Your Game plan/ To Do List    Follow up Visits: Next Medicare AWV with our clinical staff: 11/05/2024   Have you seen your provider in the last 6 months (3 months if uncontrolled diabetes)? No Next Office Visit with your provider: 11/21/2023  Clinician Recommendations:  Aim for 30 minutes of exercise or brisk walking, 6-8 glasses of water, and 5 servings of fruits and vegetables each day. Educated and advised on getting a 2nd Shingles vaccine in 2025.      This is a list of the screening recommended for you and due dates:  Health Maintenance  Topic Date Due   Zoster (Shingles) Vaccine (2 of 2) 01/04/2022   COVID-19 Vaccine (4 - 2024-25 season) 12/26/2022   Flu Shot  11/25/2023   Medicare Annual Wellness Visit  10/26/2024   Colon Cancer Screening  05/10/2025   DTaP/Tdap/Td vaccine (3 - Td or Tdap) 03/22/2032   Pneumococcal Vaccine for age over 67  Completed   DEXA scan (bone density measurement)  Completed   Hepatitis C Screening  Completed   Hepatitis B Vaccine  Aged Out   HPV Vaccine  Aged Out   Meningitis B Vaccine  Aged Out    Advanced directives: (Declined) Advance directive discussed with you today. Even though you declined this today, please call our office should you change your mind, and we can give you the proper paperwork for you to fill out. Advance Care Planning is important because it:  [x]  Makes sure you receive the medical care that is consistent with your values, goals, and preferences  [x]  It provides guidance to your family and loved ones and reduces their decisional burden about whether or not  they are making the right decisions based on your wishes.  Follow the link provided in your after visit summary or read over the paperwork we have mailed to you to help you started getting your Advance Directives in place. If you need assistance in completing these, please reach out to us  so that we can help you!

## 2023-10-27 NOTE — Progress Notes (Signed)
 Subjective:   Carrie [name] is a 78 y.o. who presents for a Medicare Wellness preventive visit.  As a reminder, Annual Wellness Visits don't include a physical exam, and some assessments may be limited, especially if this visit is performed virtually. We may recommend an in-person follow-up visit with your provider if needed.  Visit Complete: Virtual I connected with  Carrie [name] on 10/27/23 by a audio enabled telemedicine application and verified that I am speaking with the correct person using two identifiers.  Patient Location: Home  Provider Location: Office/Clinic  I discussed the limitations of evaluation and management by telemedicine. The patient expressed understanding and agreed to proceed.  Vital Signs: Because this visit was a virtual/telehealth visit, some criteria may be missing or patient reported. Any vitals not documented were not able to be obtained and vitals that have been documented are patient reported.  VideoDeclined- This patient declined Librarian, academic. Therefore the visit was completed with audio only.  Persons Participating in Visit: Patient.  AWV Questionnaire: No: Patient Medicare AWV questionnaire was not completed prior to this visit.  Cardiac Risk Factors include: advanced age (>57men, >76 women);dyslipidemia;hypertension     Objective:    Today's Vitals   10/27/23 1340  Weight: 115 lb (52.2 kg)  Height: 5' (1.524 m)   Body mass index is 22.46 kg/m.     10/27/2023    1:39 PM 10/21/2022    1:44 PM 11/09/2021    9:36 AM 09/08/2021   10:25 AM 09/04/2021    2:10 PM 04/22/2021    7:38 AM 11/06/2020   12:46 PM  Advanced Directives  Does Patient Have a Medical Advance Directive? No Yes Yes Yes Yes Yes Yes  Type of Furniture conservator/restorer;Living will Living will;Healthcare Power of [employer] Power of [place];Living will Living will;Healthcare Power of [employer] Power of [place];Living will Living will;Healthcare Power of Attorney  Does patient want to make changes to medical advance directive?   No - Patient declined No - Patient declined  No - Patient declined No - Patient declined  Copy of Healthcare Power of Attorney in Chart?  No - copy requested No - copy requested No - copy requested  No - copy requested No - copy requested  Would patient like information on creating a medical advance directive? No - Patient declined          Current Medications (verified) Outpatient Encounter Medications as of 10/27/2023  Medication Sig   Cholecalciferol  50 MCG (2000 UT) TABS Take 1 tablet (2,000 Units total) by mouth daily.   ELIQUIS  5 MG TABS tablet Take 1 tablet (5 mg total) by mouth 2 (two) times daily.   ezetimibe  (ZETIA ) 10 MG tablet Take 1 tablet (10 mg total) by mouth daily.   KLOR-CON  M10 10 MEQ tablet TAKE 1 TABLET BY MOUTH TWICE A DAY   metoprolol  succinate (TOPROL -XL) 50 MG 24 hr tablet Take 1 tablet (50 mg total) by mouth daily. Take with or immediately following a meal.   rosuvastatin  (CRESTOR ) 20 MG tablet Take 1 tablet (20 mg total) by mouth daily.   vitamin B-12 (CYANOCOBALAMIN) 1000 MCG tablet Take 1,000 mcg by mouth daily.   amoxicillin  (AMOXIL ) 500 MG tablet Take 4 tablets (2,000 mg total) by mouth as directed. 1 hour prior to dental work including cleanings (Patient not taking: Reported on 10/27/2023)   traMADol  (ULTRAM ) 50 MG tablet Take 50 mg by mouth every 6 (six) hours  as needed for moderate pain (pain score 4-6) or severe pain (pain score 7-10). (Patient not taking: Reported on 10/27/2023)   Facility-Administered Encounter Medications as of 10/27/2023  Medication   [START ON 01/12/2024] denosumab  (PROLIA ) injection 60 mg    Allergies (verified) Codeine, Crestor  [rosuvastatin ], Latex, and Tape   History: Past Medical History:  Diagnosis Date   Coronary artery disease    a. Prior LHC (07/24/03): LMCA heavily calcified without  significant stenosis, LAD with heavy calcification proximally. 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 Wonda & Dr. Lucas   Severe aortic stenosis    Wears hearing aid    left   Past Surgical History:  Procedure Laterality Date   ATRIAL ABLATION SURGERY  04/27/2003   BREAST SURGERY  04/26/1980   Benign tumor removed RT   BUNIONECTOMY  04/07/2012   Procedure: BUNIONECTOMY;  Surgeon: Norleen LITTIE Gavel, MD;  Location: [facility];  Service: Orthopedics;  Laterality: Left;  CHEVRON OSTEOTOMY LEFT FOOT    CARDIAC CATHETERIZATION  04/27/2003   EYE SURGERY     bilateral cataracts   GANGLION CYST EXCISION     left wrist   HERNIA REPAIR  04/26/1988   rt ing   INTRAOPERATIVE TRANSTHORACIC ECHOCARDIOGRAM N/A 09/08/2021   Procedure: INTRAOPERATIVE TRANSTHORACIC ECHOCARDIOGRAM;  Surgeon: Wonda Sharper, MD;  Location: [facility] INVASIVE CV LAB;  Service: Open Heart Surgery;  Laterality: N/A;   RIGHT/LEFT HEART CATH AND CORONARY ANGIOGRAPHY N/A 04/22/2021   Procedure: RIGHT/LEFT HEART CATH AND CORONARY ANGIOGRAPHY;  Surgeon: Wonda Sharper, MD;  Location: [facility] INVASIVE CV LAB;  Service: Cardiovascular;  Laterality: N/A;   TONSILLECTOMY     TRANSCATHETER AORTIC VALVE REPLACEMENT, TRANSFEMORAL N/A 09/08/2021   Procedure: Transcatheter Aortic Valve Replacement, Transfemoral;  Surgeon: Wonda Sharper, MD;  Location: [facility] INVASIVE CV LAB;  Service:  Open Heart Surgery;  Laterality: N/A;   TUBAL LIGATION     Family History  Problem Relation Age of Onset   Heart disease Mother 67   Congestive Heart Failure Mother    Dementia Mother    Hypertension Other    Valvular heart disease Father        Had valves replaced   Colon cancer Neg Hx    Rectal cancer Neg Hx    Social History   Socioeconomic History   Marital status: Divorced    Spouse name: Not on file   Number of children: 2   Years of education: Not on file   Highest education level: Not on file  Occupational History   Occupation: retired  Tobacco Use   Smoking status: Former    Current packs/day: 0.00    Types: Cigarettes    Quit date: 04/05/1999    Years since quitting: 24.5   Smokeless tobacco: Never   Tobacco comments:    Regular Exercise-No  Vaping Use   Vaping status: Never Used  Substance and Sexual Activity   Alcohol use: Yes  Alcohol/week: 1.0 standard drink of alcohol    Types: 1 Glasses of wine per week    Comment: occas   Drug use: No   Sexual activity: Not Currently    Birth control/protection: Post-menopausal  Other Topics Concern   Not on file  Social History Narrative   Divorced   Social Drivers of Health   Financial Resource Strain: Low Risk  (10/27/2023)   Overall Financial Resource Strain (CARDIA)    Difficulty of Paying Living Expenses: Not hard at all  Food Insecurity: No Food Insecurity (10/27/2023)   Hunger Vital Sign    Worried About Running Out of Food in the Last Year: Never true    Ran Out of Food in the Last Year: Never true  Transportation Needs: No Transportation Needs (10/27/2023)   PRAPARE - Administrator, Civil Service (Medical): No    Lack of Transportation (Non-Medical): No  Physical Activity: Sufficiently Active (10/27/2023)   Exercise Vital Sign    Days of Exercise per Week: 7 days    Minutes of Exercise per Session: 60 min  Stress: No Stress Concern Present (10/27/2023)   [employer] of Occupational  Health - Occupational Stress Questionnaire    Feeling of Stress: Not at all  Social Connections: Moderately Integrated (10/27/2023)   Social Connection and Isolation Panel    Frequency of Communication with Friends and Family: More than three times a week    Frequency of Social Gatherings with Friends and Family: More than three times a week    Attends Religious Services: More than 4 times per year    Active Member of [employer] or Organizations: Yes    Attends Engineer, structural: More than 4 times per year    Marital Status: Divorced    Tobacco Counseling Counseling given: No Tobacco comments: Regular Exercise-No    Clinical Intake:  Pre-visit preparation completed: Yes  Pain : No/denies pain     BMI - recorded: 22.46 Nutritional Status: BMI of 19-24  Normal Nutritional Risks: Unintentional weight loss Diabetes: No  Lab Results  Component Value Date   HGBA1C 5.4 11/17/2021   HGBA1C 5.6 03/19/2019     How often do you need to have someone help you when you read instructions, pamphlets, or other written materials from your doctor or pharmacy?: 1 - Never  Interpreter Needed?: No  Information entered by :: Verdie Saba, CMA   Activities of Daily Living     10/27/2023    1:46 PM  In your present state of health, do you have any difficulty performing the following activities:  Hearing? 1  Comment in the process of getting new hearing aids  Vision? 0  Difficulty concentrating or making decisions? 0  Walking or climbing stairs? 0  Dressing or bathing? 0  Doing errands, shopping? 0  Preparing Food and eating ? N  Using the Toilet? N  In the past six months, have you accidently leaked urine? N  Do you have problems with loss of bowel control? N  Managing your Medications? N  Managing your Finances? N  Housekeeping or managing your Housekeeping? N    Patient Care Team: Joshua Debby CROME, MD as PCP - General (Internal Medicine) Santo Stanly LABOR, MD as  PCP - Cardiology (Cardiology) Fate Morna SAILOR, [facility] (Inactive) as Pharmacist (Pharmacist) Wonda Sharper, MD as Consulting Physician (Cardiology) Roz Anes, MD as Consulting Physician (Ophthalmology) Joshua Sieving, MD as Consulting Physician (Dermatology)  I have updated your Care Teams any recent Medical  Services you may have received from other providers in the past year.     Assessment:   This is a routine wellness examination for [place].  Hearing/Vision screen Hearing Screening - Comments:: Wears hearing aids - in the process of getting new devices Vision Screening - Comments:: Denies Vision Concerns - sees an Ophthalmologist in [place], [place]   Goals Addressed               This Visit's Progress     Patient Stated (pt-stated)        Patient stated she has been watching her weight and diet       Depression Screen     10/27/2023    1:48 PM 07/12/2023    8:56 AM 10/21/2022    1:41 PM 03/29/2022   10:28 AM 12/21/2021    2:40 PM 11/17/2021   11:09 AM 11/09/2021    9:14 AM  PHQ 2/9 Scores  PHQ - 2 Score 0 0 0 0 0 0 0  PHQ- 9 Score 0   0       Fall Risk     10/27/2023    1:47 PM 07/12/2023    8:55 AM 10/21/2022    1:43 PM 03/29/2022   10:28 AM 12/21/2021    2:41 PM  Fall Risk   Falls in the past year? 0 0 0 0 0  Number falls in past yr: 0 0 0 0 0  Injury with Fall? 0 0 0 0 0  Risk for fall due to : No Fall Risks No Fall Risks No Fall Risks No Fall Risks No Fall Risks  Follow up Falls evaluation completed;Falls prevention discussed Falls evaluation completed Falls prevention discussed Falls evaluation completed  Falls evaluation completed      Data saved with a previous flowsheet row definition    MEDICARE RISK AT HOME:  Medicare Risk at Home Any stairs in or around the home?: Yes (outside) If so, are there any without handrails?: No Home free of loose throw rugs in walkways, pet beds, electrical cords, etc?: Yes Adequate lighting in your home to reduce  risk of falls?: Yes Life alert?: No Use of a cane, walker or w/c?: No Grab bars in the bathroom?: Yes Shower chair or bench in shower?: Yes Elevated toilet seat or a handicapped toilet?: Yes  TIMED UP AND GO:  Was the test performed?  No  Cognitive Function: 6CIT completed    01/19/2017   11:47 AM 04/22/2015    4:30 PM  MMSE - Mini Mental State Exam  Not completed:  --  Orientation to time 5    Orientation to Place 5    Registration 3    Attention/ Calculation 5    Recall 2    Language- name 2 objects 2    Language- repeat 1   Language- follow 3 step command 3    Language- read & follow direction 1    Write a sentence 1    Copy design 1    Total score 29       Data saved with a previous flowsheet row definition        10/27/2023    1:51 PM 10/21/2022    1:44 PM 11/09/2021    9:16 AM  6CIT Screen  What Year? 0 points 0 points 0 points  What month? 0 points 0 points 0 points  What time? 0 points 0 points 0 points  Count back from 20 0 points 0 points 0  points  Months in reverse 0 points 0 points 0 points  Repeat phrase 0 points 0 points 0 points  Total Score 0 points 0 points 0 points    Immunizations Immunization History  Administered Date(s) Administered   Fluad Quad(high Dose 65+) 01/04/2019, 03/08/2022   Influenza Whole 01/06/2010, 01/19/2012   Influenza, High Dose Seasonal PF 01/23/2015, 01/20/2016, 01/19/2017, 01/03/2018   Influenza,inj,Quad PF,6+ Mos 02/08/2014   Influenza-Unspecified 03/15/2021   Moderna Sars-Covid-2 Vaccination 05/29/2019, 06/26/2019, 02/20/2020   Pneumococcal Conjugate-13 04/22/2015   Pneumococcal Polysaccharide-23 06/22/2012   Td 12/25/2009   Tdap 03/22/2022   Zoster Recombinant(Shingrix) 11/09/2021   Zoster, Live 12/25/2009    Screening Tests Health Maintenance  Topic Date Due   Zoster Vaccines- Shingrix (2 of 2) 01/04/2022   COVID-19 Vaccine (4 - 2024-25 season) 12/26/2022   INFLUENZA VACCINE  11/25/2023   Medicare Annual  Wellness (AWV)  10/26/2024   Colonoscopy  05/10/2025   DTaP/Tdap/Td (3 - Td or Tdap) 03/22/2032   Pneumococcal Vaccine: 50+ Years  Completed   DEXA SCAN  Completed   Hepatitis C Screening  Completed   Hepatitis B Vaccines  Aged Out   HPV VACCINES  Aged Out   Meningococcal B Vaccine  Aged Out    Health Maintenance  Health Maintenance Due  Topic Date Due   Zoster Vaccines- Shingrix (2 of 2) 01/04/2022   COVID-19 Vaccine (4 - 2024-25 season) 12/26/2022   Health Maintenance Items Addressed: 10/27/2023   Additional Screening:  Vision Screening: Recommended annual ophthalmology exams for early detection of glaucoma and other disorders of the eye. Would you like a referral to an eye doctor? No  Patient stated she has had an eye exam with an Ophthalmologist in [place], [place] (unable to recall name).  Dental Screening: Recommended annual dental exams for proper oral hygiene  Community Resource Referral / Chronic Care Management: CRR required this visit?  No   CCM required this visit?  No   Plan:    I have personally reviewed and noted the following in the patient's chart:   Medical and social history Use of alcohol, tobacco or illicit drugs  Current medications and supplements including opioid prescriptions. Patient is not currently taking opioid prescriptions. Functional ability and status Nutritional status Physical activity Advanced directives List of other physicians Hospitalizations, surgeries, and ER visits in previous 12 months Vitals Screenings to include cognitive, depression, and falls Referrals and appointments  In addition, I have reviewed and discussed with patient certain preventive protocols, quality metrics, and best practice recommendations. A written personalized care plan for preventive services as well as general preventive health recommendations were provided to patient.   Verdie CHRISTELLA Saba, CMA   10/27/2023   After Visit Summary: (MyChart) Due to this  being a telephonic visit, the after visit summary with patients personalized plan was offered to patient via MyChart   Notes: Scheduled an appt w/PCP to discuss Unintentional Wt loss for 11/21/2023 at 2pm.

## 2023-11-04 ENCOUNTER — Ambulatory Visit: Admitting: Physician Assistant

## 2023-11-07 ENCOUNTER — Ambulatory Visit (HOSPITAL_COMMUNITY)
Admission: RE | Admit: 2023-11-07 | Discharge: 2023-11-07 | Disposition: A | Discharge status: Home / Self Care | Source: Ambulatory Visit | Attending: Internal Medicine | Admitting: Internal Medicine

## 2023-11-07 DIAGNOSIS — I421 Obstructive hypertrophic cardiomyopathy: Secondary | ICD-10-CM | POA: Insufficient documentation

## 2023-11-07 DIAGNOSIS — I350 Nonrheumatic aortic (valve) stenosis: Secondary | ICD-10-CM | POA: Insufficient documentation

## 2023-11-07 DIAGNOSIS — I480 Paroxysmal atrial fibrillation: Secondary | ICD-10-CM | POA: Insufficient documentation

## 2023-11-07 DIAGNOSIS — Z952 Presence of prosthetic heart valve: Secondary | ICD-10-CM | POA: Diagnosis not present

## 2023-11-07 LAB — ECHOCARDIOGRAM COMPLETE
AV Mean grad: 23 mmHg
AV Peak grad: 42.5 mmHg
Ao pk vel: 3.26 m/s
Area-P 1/2: 1.78 cm2
MV M vel: 6.67 m/s
MV Peak grad: 178 mmHg
S' Lateral: 2.5 cm

## 2023-11-08 ENCOUNTER — Telehealth: Payer: Self-pay

## 2023-11-08 NOTE — Telephone Encounter (Signed)
 Called pt advised of MD need for OV to review Echo results and plan or care.  Pt reports to schedule for 11/16/23 at 10 am.  Will call to see if one of her sons can bring her.  If doesn't have transportation will call back to reschedule.

## 2023-11-16 ENCOUNTER — Ambulatory Visit: Payer: Self-pay

## 2023-11-16 ENCOUNTER — Ambulatory Visit: Admitting: Internal Medicine

## 2023-11-16 NOTE — Telephone Encounter (Signed)
 Called pt offered OV 11/23/23 at 10 am.  Pt expresses will need to check with Son and will call back with response.

## 2023-11-17 NOTE — Telephone Encounter (Signed)
 Called pt to f/u appointment time.  Pt reports doesn't have any one that can bring her on 11/23/23.  Reports Monday, Thursday, or Friday would work better.   Advised will send to MD to advise.

## 2023-11-18 ENCOUNTER — Encounter: Payer: Self-pay | Admitting: Internal Medicine

## 2023-11-18 NOTE — Telephone Encounter (Signed)
 error

## 2023-11-18 NOTE — Telephone Encounter (Signed)
PT returning call

## 2023-11-18 NOTE — Telephone Encounter (Signed)
 Called pt OV rescheduled to 11/25/23 at 9:20 am.

## 2023-11-21 ENCOUNTER — Ambulatory Visit: Admitting: Internal Medicine

## 2023-11-22 ENCOUNTER — Ambulatory Visit: Admitting: Physician Assistant

## 2023-11-25 ENCOUNTER — Ambulatory Visit: Attending: Internal Medicine | Admitting: Internal Medicine

## 2023-11-25 VITALS — BP 122/70 | HR 80 | Ht 61.0 in | Wt 114.8 lb

## 2023-11-25 DIAGNOSIS — I340 Nonrheumatic mitral (valve) insufficiency: Secondary | ICD-10-CM

## 2023-11-25 DIAGNOSIS — Z952 Presence of prosthetic heart valve: Secondary | ICD-10-CM

## 2023-11-25 DIAGNOSIS — I421 Obstructive hypertrophic cardiomyopathy: Secondary | ICD-10-CM

## 2023-11-25 DIAGNOSIS — I350 Nonrheumatic aortic (valve) stenosis: Secondary | ICD-10-CM | POA: Diagnosis not present

## 2023-11-25 NOTE — Progress Notes (Signed)
 Cardiology Office Note:  .    Date:  11/25/2023  ID:  Carrie [name], DOB 1945-10-17, MRN [id number] PCP: Joshua Debby CROME, MD  [facility] Providers Cardiologist:  Stanly DELENA Leavens, MD     CC: Follow up Echo  History of Present Illness: .    Carrie [name] is a 78 year old female with hypertrophic obstructive cardiomyopathy and severe mitral regurgitation who presents for evaluation of her echocardiogram and symptom burden.  She has a history of hypertrophic obstructive cardiomyopathy with severe left ventricular outflow tract obstruction and aortic stenosis, status post transcatheter aortic valve replacement. She also has severe mitral annular calcification, at least mild mitral stenosis, and moderate to severe mitral regurgitation. An echocardiogram showed a severe left ventricular outflow tract gradient and calcific mitral valve disease with significant mitral regurgitation.  She feels 'okay' with no current issues such as chest pain, breathing difficulties, lightheadedness, fatigue, or syncope. Previously, she was able to walk half a mile to a mile daily without symptoms, but she has reduced her walking due to the hot weather. No shortness of breath during activities such as grocery shopping.  Her past medical history includes a diagnosis of atrial flutter in 2017, for which she is on anticoagulation therapy. She has not experienced any arrhythmia-related symptoms recently.  She has experienced weight loss and reports a decreased appetite, stating that she 'just doesn't taste food anymore.' Her son and sister provide meals for her, but she struggles with eating enough.   Relevant histories: .  Social Former [employer]. Two children not screened but are now amenable to it  ROS: As per HPI.   Studies Reviewed: .   Cardiac Studies & Procedures   ______________________________________________________________________________________________ CARDIAC  CATHETERIZATION  CARDIAC CATHETERIZATION 04/22/2021  Conclusion   Mid RCA lesion is 50% stenosed.   Hemodynamic findings consistent with mild pulmonary hypertension.   There is severe aortic valve stenosis.  1.  Calcified but nonobstructive coronary artery disease with patency of the left main, LAD, left circumflex, ramus intermedius, and right coronary arteries 2.  Heavy mitral annular calcification with known moderate to severe mitral regurgitation and mild mitral stenosis by echo assessment 3.  Severe aortic stenosis with mean transvalvular gradient 27 mmHg and calculated aortic valve area 0.97 cm (probable combination of valvular and subvalvular stenosis) 4.  Mild pulmonary hypertension with mean pulmonary artery pressure 28 mmHg, transpulmonary gradient 9 mmHg, V wave 24 mmHg.  Continued evaluation of valvular heart disease with consideration of cardiac surgery  Findings Coronary Findings Diagnostic  Dominance: Right  Left Main There is mild diffuse disease throughout the vessel. Calcified but widely patent with no significant stenosis  Left Anterior Descending There is mild diffuse disease throughout the vessel. The vessel is moderately calcified. The LAD is calcified throughout its proximal and mid segments.  The proximal vessel is patent with no stenosis.  The first diagonal is patent with very mild ostial narrowing.  The diagonal is a large caliber vessel.  The mid and distal LAD have mild diffuse irregularities with no significant stenoses  Ramus Intermedius There is mild diffuse disease throughout the vessel. The intermediate branch is patent with mild proximal irregularity and nonobstructive stenosis  Left Circumflex The vessel exhibits minimal luminal irregularities. The circumflex is patent.  The first obtuse marginal branch is patent with minimal irregularity.  Right Coronary Artery There is mild diffuse disease throughout the vessel. Dominant vessel with diffuse  calcification.  The proximal vessel is widely patent.  The mid vessel has diffuse irregular 40 to 50% stenosis.  The distal vessel has mild irregularity with no significant stenosis.  The PDA and PLA branches are patent without significant stenoses. Mid RCA lesion is 50% stenosed. The lesion is moderately calcified.  Intervention  No interventions have been documented.   STRESS TESTS  ECHOCARDIOGRAM STRESS TEST 10/20/2020  Narrative EXERCISE STRESS ECHO REPORT   --------------------------------------------------------------------------------  Patient Name:   Carrie [name] Date of Exam: 10/20/2020 Medical Rec #:  [id number]               Height:       61.0 in Accession #:    [id number]              Weight:       129.8 lb Date of Birth:  [birth year]                BSA:          1.572 m Patient Age:    75 years                BP:           153/77 mmHg Patient Gender: F                       HR:           79 bpm. Exam Location:  Outpatient  Procedure: Stress Echo  MODIFIED REPORT: This report was modified by Soyla Merck MD on 10/20/2020 due to revision. Indications:     Aortic Stenosis, Mitral Regurgitation  History:         Patient has prior history of Echocardiogram examinations, most recent 09/10/2020.  Sonographer:     Damien Senior RDCS Referring Phys:  [phone] MICHAEL Rodden Diagnosing Phys: Soyla Merck MD   Sonographer Comments: Designated Party Release on file 12/25/2009 IMPRESSIONS   1. Patient exercised on the treadmill for 6:48 mins and stopped due to leg fatigue. Reading MD present in room for stress test, patient denied chest pain or pressure, DOE, lightheadedness or dizziness and had no presyncope throughout study and at peak stress. No symptoms in recovery. No hypotension during stress test. 2. Baseline echocardiogram demonstrated severe aortic valve stenosis with a mean gradient of 47 mmHg, dynamic LVOT instantaneous gradient of 30 mmHg, severe MR due  to SAM, and moderate AI. 3. Post stress images were challenging to obtain after peak stress. Post stress images demonstrate severe AS, dynamic LVOT obstruction with instantaneous gradient of 34 mmHg, severe MR due to SAM, and moderate AI. Trivial to mild TR with RVSP of 32 mmHg and estimated RA pressure of 3 mmHg. 4. Nonspecific ST T wave changes laterally at baseline. 1-2 mm horizontal ST depressions in V5-V6 that begin early in stress and are most prominent at peak stress. This may be a nonspecific finding given baseline nonspecific ECG changes. 5. This study was not performed to evaluate for ischemia. 6. The findings of the study demonstrate that true severe aortic stenosis is present. 7. This is an indeterminate risk study for ischemia. Low risk hemodyanmic stress test.  Conclusion(s)/Recommendation(s): Study performed with good exercise capacity and no cardiopulmonary symptoms in setting of severe AS, moderate-severe LVOT obstruction, severe MR, and moderate AI. Unable to demostrate pulmonary hypertension with exercise stress.  FINDINGS  Exam Protocol: The patient exercised on a treadmill according to a Bruce protocol.   Patient Performance: The patient exercised for 6 minutes and 48 seconds,  achieving 8.5 METS. The maximum stage achieved was III of the Bruce protocol. The baseline heart rate was 69 bpm. The heart rate at peak stress was 150 bpm. The target heart rate was calculated to be 123 bpm. The percentage of maximum predicted heart rate achieved was 103.8 %. The baseline blood pressure was 112/66 mmHg. The blood pressure at peak stress was 163/83 mmHg. The blood pressure response was normal. The patient developed leg fatigue during the stress exam. The symptoms resolved with rest. The patient's functional capacity was above average.  EKG: Resting EKG showed normal sinus rhythm with nonspecific ST-T wave changes and left ventricular hypertrophy. The patient developed baseline  abnormalities during exercise. There was 2.0 mm of horizontal ST segment depression in lead(s) V5 and V6 that resolved in 5 minute(s) into recovery.   2D Echo Findings: The baseline ejection fraction was 70%. Baseline regional wall motion abnormalities were not present. This is an inconclusive stress echocardiogram for ischemia.  Stress Doppler: Aortic Valve: Baseline hemodynamics were: (LVOT VTI 38 cm ), . The findings of the study demonstrate that true severe aortic stenosis is present.  LVOT: The baseline LVOT gradient was 34 mmHG.  Mitral Regurgitation: At baseline, severe mitral regurgitation was present.  Right Ventricular Systolic Pressure: The PASP increased to 32.4 mmHg at peak stress.   Soyla Merck MD Electronically signed on 10/20/2020 at 12:07:17 PM      Final (Updated)   ECHOCARDIOGRAM  ECHOCARDIOGRAM COMPLETE 11/07/2023  Narrative ECHOCARDIOGRAM REPORT    Patient Name:   Carrie [name] Date of Exam: 11/07/2023 Medical Rec #:  [id number]               Height:       60.0 in Accession #:    [id number]              Weight:       115.0 lb Date of Birth:  [birth year]                BSA:          1.475 m Patient Age:    78 years                BP:           128/77 mmHg Patient Gender: F                       HR:           64 bpm. Exam Location:  [address]  Procedure: 2D Echo, Cardiac Doppler and Color Doppler (Both Spectral and Color Flow Doppler were utilized during procedure).  Indications:    Z95.2 s/p TAVR  History:        Patient has prior history of Echocardiogram examinations, most recent 11/12/2022. HOCM, CAD, CKD; Arrythmias:Atrial Fibrillation and Atrial Flutter. Aortic Valve: 26 mm stented (TAVR) valve is present in the aortic position. Procedure Date: 09/08/2021.  Sonographer:    Waldo Guadalajara RCS Referring Phys: [id number] Niambi Smoak A Natisha Trzcinski  IMPRESSIONS   1. Left ventricular ejection fraction, by estimation, is 70 to 75%. The  left ventricle has hyperdynamic function. There is severe concentric left ventricular hypertrophy with chordal SAM. With valsalva there is appears to be an intracavitary gradient of (difficult to visualize location of doppler). 2. Right ventricular systolic function is normal. The right ventricular size is normal. There is normal pulmonary artery systolic pressure. 3. Left atrial size was severely dilated. 4. Right atrial  size was mild to moderately dilated. 5. The mitral valve is degenerative. Severe mitral valve regurgitation. There is severe posterior mitral annular calcification. 6. The aortic valve has been repaired/replaced. Aortic valve regurgitation is not visualized. There is a 26 mm stented (TAVR) valve present in the aortic position. Procedure Date: 09/08/2021. Aortic valve mean gradient measures 23.0 mmHg. Aortic valve Vmax measures 3.26 m/s. 7. A small pericardial effusion is present.  FINDINGS Left Ventricle: Left ventricular ejection fraction, by estimation, is 70 to 75%. The left ventricle has hyperdynamic function. The left ventricle has no regional wall motion abnormalities. The left ventricular internal cavity size was small. There is severe concentric left ventricular hypertrophy. Left ventricular diastolic parameters are indeterminate.  Right Ventricle: The right ventricular size is normal. No increase in right ventricular wall thickness. Right ventricular systolic function is normal. There is normal pulmonary artery systolic pressure. The tricuspid regurgitant velocity is 2.39 m/s, and with an assumed right atrial pressure of 3 mmHg, the estimated right ventricular systolic pressure is 25.8 mmHg.  Left Atrium: Left atrial size was severely dilated.  Right Atrium: Right atrial size was mild to moderately dilated.  Pericardium: A small pericardial effusion is present. The pericardial effusion appears to contain mixed echogenic material.  Mitral Valve: The mitral  valve is degenerative in appearance. Severe mitral valve regurgitation. MV peak gradient, 9.4 mmHg. The mean mitral valve gradient is 3.5 mmHg.  Tricuspid Valve: The tricuspid valve is normal in structure. Tricuspid valve regurgitation is mild.  Aortic Valve: The aortic valve has been repaired/replaced. Aortic valve regurgitation is not visualized. Aortic valve mean gradient measures 23.0 mmHg. Aortic valve peak gradient measures 42.5 mmHg. There is a 26 mm stented (TAVR) valve present in the aortic position. Procedure Date: 09/08/2021.  Pulmonic Valve: The pulmonic valve was not well visualized. Pulmonic valve regurgitation is trivial.  Aorta: The aortic root is normal in size and structure.  IAS/Shunts: The interatrial septum was not well visualized.   LEFT VENTRICLE PLAX 2D LVIDd:         4.20 cm LVIDs:         2.50 cm LV PW:         1.20 cm LV IVS:        1.30 cm LVOT diam:     2.00 cm LVOT Area:     3.14 cm   RIGHT VENTRICLE RV Basal diam:  2.80 cm RV S prime:     11.90 cm/s TAPSE (M-mode): 1.8 cm RVSP:           25.8 mmHg  LEFT ATRIUM             Index        RIGHT ATRIUM           Index LA diam:        3.80 cm 2.58 cm/m   RA Pressure: 3.00 mmHg LA Vol (A2C):   89.4 ml 60.59 ml/m  RA Area:     11.30 cm LA Vol (A4C):   84.4 ml 57.20 ml/m  RA Volume:   24.40 ml  16.54 ml/m LA Biplane Vol: 88.9 ml 60.25 ml/m AORTIC VALVE AV Vmax:      326.00 cm/s AV Vmean:     223.000 cm/s AV VTI:       0.664 m AV Peak Grad: 42.5 mmHg AV Mean Grad: 23.0 mmHg  AORTA Ao Root diam: 2.30 cm Ao Asc diam:  2.30 cm  MITRAL VALVE  TRICUSPID VALVE MV Area (PHT): 1.78 cm     TR Peak grad:   22.8 mmHg MV Peak grad:  9.4 mmHg     TR Vmax:        239.00 cm/s MV Mean grad:  3.5 mmHg     Estimated RAP:  3.00 mmHg MV Vmax:       1.54 m/s     RVSP:           25.8 mmHg MV Vmean:      85.4 cm/s MV Decel Time: 425 msec     SHUNTS MR Peak grad: 178.0 mmHg    Systemic Diam:  2.00 cm MR Mean grad: 115.0 mmHg MR Vmax:      667.00 cm/s MR Vmean:     502.0 cm/s MV E velocity: 117.00 cm/s MV A velocity: 118.00 cm/s MV E/A ratio:  0.99  Aditya Sabharwal Electronically signed by Ria Commander Signature Date/Time: 11/07/2023/1:19:17 PM    Final    MONITORS  LONG TERM MONITOR-LIVE TELEMETRY (3-14 DAYS) 10/31/2018  Narrative  Sinus bradycardia to sinus tachycardia.  Very short runs of atrial tachycardia and nsVT.  No atrial fibrillation.  Sinus bradycardia to sinus tachycardia. Very short runs of atrial tachycardia and nsVT. No atrial fibrillation. Continue the same dose of metoprolol .   CT SCANS  CT CORONARY MORPH W/CTA COR W/SCORE 04/13/2021  Addendum 04/13/2021  2:25 PM ADDENDUM REPORT: 04/13/2021 14:22  CLINICAL DATA:  Aortic Valve pathology with assessment for TAVR  EXAM: Cardiac TAVR CT  TECHNIQUE: The patient was scanned on a Siemens Force 192 slice scanner. A 120 kV retrospective scan was triggered in the descending thoracic aorta at 111 HU's. Gantry rotation speed was 270 msecs and collimation was .9 mm. No beta blockade or nitro were given. The 3D data set was reconstructed in 5% intervals of the R-R cycle. Systolic and diastolic phases were analyzed on a dedicated work station using MPR, MIP and VRT modes. The patient received 95 cc of contrast.  FINDINGS: Aortic Valve: Severely thickened aortic valve with heavy calcification and reduced excursion the planimeter valve area is 0.84 Sq cm consistent with severe aortic stenosis  Number of leaflets: 3  LVOT calcification: none  Annular calcification: none  Aortic Valve Calcium  Score: 3195  Presence of basal septal hypertrophy: 16 mm  - Systolic annulus assessment is still greater than diastolic assessment  Perimembranous septal diameter: 7 mm  Mitral Valve: There is severe mitral annular calcification  Aortic Annulus Measurements- 10% phase assessment  Major  annulus diameter: 24 mm  Minor annulus diameter: 18 mm  Annular perimeter: 66 mm  Annular area: 3.3 cm2  Aortic Root Measurements  Sinotubular Junction: 28 mm  Ascending Thoracic Aorta: 35 mm  Aortic Arch: 29 mm  Descending Thoracic Aorta: 21 mm  Sinus of Valsalva Measurements:  Right coronary cusp width: 27 mm  Left coronary cusp width: 28 mm  Non coronary cusp width: 28 mm  Mean diameter: 28 mm  Coronary Artery Height above Annulus:  Left Main: 10 mm  Left SoV height: 16 mm  Right Coronary: 12 mm  Right SoV height: 16 mm  Optimum Fluoroscopic Angle for Delivery: LAO 3, CAU 7  Cusp overlay view angle:RAO 14, CAU 25  Valves for structural team consideration: Valve is at the lower limits for a 23 mm Sapien Valve vs upper limites for a 20 mm Sapien Valve. Coronary heights listed as above; there is sufficiency SoV diameter and height for a 26 mm CoreValve (  lower limits of normal).  Non TAVR Valve Findings:  Coronary Calcium  Score:  Left main: 398  Left anterior descending artery: 1165  Left circumflex artery: 40  Right coronary artery: 1524  Ramus Intermedius: 213  Total: 3340  Percentile: 99th for age, sex, and race matched control.  Aortic atherosclerosis noted.  Vascular air seen in the main pulmonary artery.  No left atrial appendage.  IMPRESSION: 1. Severe Aortic stenosis. Findings pertinent to TAVR procedure are detailed above.  2. Patient's total coronary artery calcium  score is 3340, which is 99th percentile for subjects of the same age, gender, and race based populations.  3.  Aortic atherosclerosis.  RECOMMENDATIONS:  Coronary artery calcium  (CAC) score is a strong predictor of incident coronary heart disease (CHD) and provides predictive information beyond traditional risk factors. CAC scoring is reasonable to use in the decision to withhold, postpone, or initiate statin therapy in intermediate-risk or selected  borderline-risk asymptomatic adults (age 38-75 years and LDL-C >=70 to <190 mg/dL) who do not have diabetes or established atherosclerotic cardiovascular disease (ASCVD).* In intermediate-risk (10-year ASCVD risk >=7.5% to <20%) adults or selected borderline-risk (10-year ASCVD risk >=5% to <7.5%) adults in whom a CAC score is measured for the purpose of making a treatment decision the following recommendations have been made:  If CAC = 0, it is reasonable to withhold statin therapy and reassess in 5 to 10 years, as long as higher risk conditions are absent (diabetes mellitus, family history of premature CHD in first degree relatives (males <55 years; females <65 years), cigarette smoking, LDL >=190 mg/dL or other independent risk factors).  If CAC is 1 to 99, it is reasonable to initiate statin therapy for patients >=25 years of age.  If CAC is >=100 or >=75th percentile, it is reasonable to initiate statin therapy at any age.  Cardiology referral should be considered for patients with CAC scores >=400 or >=75th percentile.  *2018 AHA/ACC/AACVPR/AAPA/ABC/ACPM/ADA/AGS/APhA/ASPC/NLA/PCNA Guideline on the Management of Blood Cholesterol: A Report of the American College of Cardiology/American Heart Association Task Force on Clinical Practice Guidelines. J Am Coll Cardiol. 2019;73(24):3168-3209.  Ladarrell Cornwall   Electronically Signed By: Stanly Leavens M.D. On: 04/13/2021 14:22  Narrative EXAM: OVER-READ INTERPRETATION  CT CHEST  The following report is an over-read performed by radiologist Dr. Selinda Blue of [facility] on 04/13/2021. This over-read does not include interpretation of cardiac or coronary anatomy or pathology. The coronary CTA interpretation by the cardiologist is attached.  COMPARISON:  04/27/2017 chest CT angiogram.  FINDINGS: Please see the separate concurrent chest CT angiogram report for details.  IMPRESSION: Please  see the separate concurrent chest CT angiogram report for details.  Electronically Signed: By: Selinda DELENA Blue M.D. On: 04/13/2021 13:17     ______________________________________________________________________________________________       Physical Exam:    VS:  There were no vitals taken for this visit.   Wt Readings from Last 3 Encounters:  10/27/23 115 lb (52.2 kg)  07/12/23 119 lb (54 kg)  06/28/23 118 lb 9.6 oz (53.8 kg)    Gen: no distress, hard of hearing   Neck: No JVD Cardiac: No Rubs or Gallops, harsh systolic Murmur, RRR +2 radial pulses Respiratory: Clear to auscultation bilaterally, normal effort, normal  respiratory rate GI: Soft, nontender, non-distended  MS: No  edema;  moves all extremities Integument: Skin feels warm Neuro:  At time of evaluation, alert and oriented to person/place/time/situation  Psych: Normal affect, patient feels well   ASSESSMENT  AND PLAN: .    Hypertrophic Cardiomyopathy - Obstructive with LVOT and mid cavitary gradient Variant - peak gradient 51 mm HG on Valsalva   - with Severe Mitral Regurgitation and Mild Mitral Stenosis and s/p TAVR with gradient 23 mm HG - suspicion of Fabry's/Danon/Noonan's or other mimics of HCM: Moderate- this could be related to her complex valve disease; I have offered her genetic teste - Gene variant: Pending - NYHA I - maximal septal thickness 17 mm - Hypertrophic obstructive cardiomyopathy with severe left ventricular outflow tract obstruction, significant myocardial thickening, midcavitary gradient, and unclear etiology. Asymptomatic with no chest pain, dyspnea, or syncope. Potential genetic and valve disease contributions discussed. - Repeat echocardiogram in 6 months. - Provided educational materials on hypertrophic cardiomyopathy. - Monitor for symptoms such as dyspnea, fatigue, or syncope. - Discussed potential interventions if symptoms develop, including medication to decrease myocardial  contractility and alcohol septal ablation with risks such as stroke and pacemaker requirement.  I am unclear if she would get significant benefit of CMI therapy; if sx we would discuss this and consider CMI vs ASA and TMVR in MAC   Atrial Fibrillation - CHADSVASC NA, HCM-AF NA Well-controlled with medication. No current episodes. Blood pressure slightly low but asymptomatic. Explained necessity of anticoagulation to prevent complications. - Continue current medication regimen  GENETIC TESTING COUNSELING EVALUATION:  I met with the patient today to discuss cardiac genetic testing. We reviewed the following information:  INDICATION FOR TESTING: * Suspected/confirmed diagnosis of HCM vs complex valve disease  BENEFITS OF GENETIC TESTING DISCUSSED:  * May confirm or refine clinical diagnosis * May identify at-risk family members who could benefit from screening * May avoid unnecessary testing or interventions for family members who test negative  LIMITATIONS AND RISKS DISCUSSED:  * Testing may not identify a genetic cause, even if one exists * Testing may identify a variant of uncertain significance (VUS) * Results may change over time as more information becomes available * Potential for unexpected or secondary findings  FINANCIAL CONSIDERATIONS DISCUSSED:  * Insurance coverage and potential out-of-pocket costs * Prior authorization requirements  LIFE INSURANCE/DISABILITY INSURANCE IMPLICATIONS:  * GINA protection applies to health insurance and employment but not life, disability, or long-term care insurance * Recommendation to consider securing insurance prior to testing if concerned * Documentation considerations for insurance applications (patient and children have life insurance)  FAMILY IMPLICATIONS: * Cascade testing approach for relatives if positive result * Communication strategies with family members * Resources for family discussions  PATIENT UNDERSTANDING AND  DECISION:  * Patient demonstrated understanding of the above information * Patient had opportunity to ask questions which were addressed * Patient decision:wants more time to consider  FOLLOW-UP PLAN: Clinical geneticist if still on the fence  Stanly Leavens, MD FASE [facility] Cardiologist [facility]  [address], [address] [place], [place] [address] [phone]  11:26 AM   Echo in six month, March f/u with me unless sx  Time Spent Directly with Patient:   I have spent a total of 67 minutes with the patient reviewing notes, imaging, EKGs, labs, personally reviewing imaging with the son and patient and examining the patient as well as establishing an assessment and plan that was discussed personally with the patient. Discussed disease state education , using shared decision making tools and cardiac modeling, and discussing genetic testing options. Reviewed care and plan in collaboration with son.   Stanly Leavens, MD FASE [facility] Cardiologist [place]  [facility]  [address] [place], [place] [address] [phone]  9:24 AM

## 2023-11-25 NOTE — Patient Instructions (Signed)
 Medication Instructions:  Your physician recommends that you continue on your current medications as directed. Please refer to the Current Medication list given to you today.  *If you need a refill on your cardiac medications before your next appointment, please call your pharmacy*  Lab Work: NONE  If you have labs (blood work) drawn today and your tests are completely normal, you will receive your results only by: MyChart Message (if you have MyChart) OR A paper copy in the mail If you have any lab test that is abnormal or we need to change your treatment, we will call you to review the results.  Testing/Procedures: Your physician has requested that you have an echocardiogram. Echocardiography is a painless test that uses sound waves to create images of your heart. It provides your doctor with information about the size and shape of your heart and how well your heart's chambers and valves are working. This procedure takes approximately one hour. There are no restrictions for this procedure. Please do NOT wear cologne, perfume, aftershave, or lotions (deodorant is allowed). Please arrive 15 minutes prior to your appointment time.  Please note: We ask at that you not bring children with you during ultrasound (echo/ vascular) testing. Due to room size and safety concerns, children are not allowed in the ultrasound rooms during exams. Our front office staff cannot provide observation of children in our lobby area while testing is being conducted. An adult accompanying a patient to their appointment will only be allowed in the ultrasound room at the discretion of the ultrasound technician under special circumstances. We apologize for any inconvenience.   Follow-Up: At [facility], you and your health needs are our priority.  As part of our continuing mission to provide you with exceptional heart care, our providers are all part of one team.  This team includes your primary Cardiologist  (physician) and Advanced Practice Providers or APPs (Physician Assistants and Nurse Practitioners) who all work together to provide you with the care you need, when you need it.  Your next appointment:   6 month(s)  Provider:   Stanly DELENA Leavens, MD

## 2023-11-28 ENCOUNTER — Other Ambulatory Visit: Payer: Self-pay | Admitting: Internal Medicine

## 2023-11-28 DIAGNOSIS — I10 Essential (primary) hypertension: Secondary | ICD-10-CM

## 2023-11-28 DIAGNOSIS — E876 Hypokalemia: Secondary | ICD-10-CM

## 2023-11-28 NOTE — Telephone Encounter (Signed)
 Copied from CRM [id number]. Topic: Clinical - Medication Refill >> Nov 28, 2023 10:16 AM Precious C wrote: Medication:  KLOR-CON  M10 10 MEQ tablet   Has the patient contacted their pharmacy? No (Agent: If no, request that the patient contact the pharmacy for the refill. If patient does not wish to contact the pharmacy document the reason why and proceed with request.) pt notice she needed a refill, and no refills were remaining (Agent: If yes, when and what did the pharmacy advise?)  This is the patient's preferred pharmacy:  [id number] - [place], [place] - [address] AT [name] [address] [place] [address] Phone: [phone] Fax: [phone]    Is this the correct pharmacy for this prescription? Yes If no, delete pharmacy and type the correct one.   Has the prescription been filled recently? No  Is the patient out of the medication? Yes  Has the patient been seen for an appointment in the last year OR does the patient have an upcoming appointment? Yes  Can we respond through MyChart? Yes  Agent: Please be advised that Rx refills may take up to 3 business days. We ask that you follow-up with your pharmacy.

## 2023-12-05 MED ORDER — POTASSIUM CHLORIDE CRYS ER 10 MEQ PO TBCR: 10.0000 meq | EXTENDED_RELEASE_TABLET | Freq: Two times a day (BID) | ORAL | 0 refills | Status: AC

## 2023-12-12 ENCOUNTER — Other Ambulatory Visit (HOSPITAL_COMMUNITY): Payer: Self-pay

## 2023-12-12 ENCOUNTER — Telehealth: Payer: Self-pay

## 2023-12-12 NOTE — Telephone Encounter (Signed)
 Prolia  VOB initiated via [email or web address]  Next Prolia  inj DUE: 01/12/24

## 2023-12-12 NOTE — Telephone Encounter (Signed)
 SABRA

## 2023-12-12 NOTE — Telephone Encounter (Signed)
 PA for [employer]Star submitted via [employer]. Authorization Number : [id number]  Pharmacy benefit: $154.64

## 2023-12-12 NOTE — Telephone Encounter (Signed)
 Pt ready for scheduling for PROLIA  on or after : 01/12/24  Option# 1: Buy/Bill (Office supplied medication)  Out-of-pocket cost due at time of clinic visit: $357  Number of injection/visits approved: 2  Primary: [employer] Prolia  co-insurance: 20% Admin fee co-insurance: 20%  Secondary: --- Prolia  co-insurance:  Admin fee co-insurance:   Medical Benefit Details: Date Benefits were checked: 12/12/23 Deductible: NO/ Coinsurance: 20%/ Admin Fee: 20%  Prior Auth: APPROVED PA# [id number] Expiration Date: 12/12/23-12/11/24   # of doses approved: 2 ----------------------------------------------------------------------- Option# 2- Med Obtained from pharmacy:  Pharmacy benefit: Copay $154.64 (Paid to pharmacy) Admin Fee: 20% (Pay at clinic)  Prior Auth: N/A PA# Expiration Date:   # of doses approved:   If patient wants fill through the pharmacy benefit please send prescription to: [facility], and include estimated need by date in rx notes. Pharmacy will ship medication directly to the office.  Patient NOT eligible for Prolia  Copay Card. Copay Card can make patient's cost as little as $25. Link to apply: [email or web address]://[email or web address].com/copay  ** This summary of benefits is an estimation of the patient's out-of-pocket cost. Exact cost may very based on individual plan coverage.

## 2023-12-13 ENCOUNTER — Other Ambulatory Visit: Payer: Self-pay | Admitting: Internal Medicine

## 2023-12-13 ENCOUNTER — Other Ambulatory Visit: Payer: Self-pay | Admitting: Cardiology

## 2023-12-13 DIAGNOSIS — E785 Hyperlipidemia, unspecified: Secondary | ICD-10-CM

## 2023-12-13 DIAGNOSIS — E876 Hypokalemia: Secondary | ICD-10-CM

## 2023-12-13 DIAGNOSIS — I10 Essential (primary) hypertension: Secondary | ICD-10-CM

## 2023-12-13 DIAGNOSIS — Z79899 Other long term (current) drug therapy: Secondary | ICD-10-CM

## 2023-12-13 DIAGNOSIS — I2510 Atherosclerotic heart disease of native coronary artery without angina pectoris: Secondary | ICD-10-CM

## 2024-01-16 ENCOUNTER — Ambulatory Visit

## 2024-01-23 ENCOUNTER — Ambulatory Visit

## 2024-01-30 ENCOUNTER — Ambulatory Visit: Admitting: Internal Medicine

## 2024-02-09 ENCOUNTER — Ambulatory Visit

## 2024-02-16 ENCOUNTER — Ambulatory Visit

## 2024-02-16 DIAGNOSIS — M810 Age-related osteoporosis without current pathological fracture: Secondary | ICD-10-CM | POA: Diagnosis not present

## 2024-02-16 MED ORDER — DENOSUMAB 60 MG/ML ~~LOC~~ SOSY: 60.0000 mg | PREFILLED_SYRINGE | SUBCUTANEOUS | Status: AC

## 2024-02-16 NOTE — Progress Notes (Signed)
 After obtaining consent, and per orders of Dr. Joshua, injection of Prolia  given by Edsel CHRISTELLA Kerns. Patient tolerated well.

## 2024-02-16 NOTE — Addendum Note (Signed)
 Addended by: EZZARD EDSEL HERO on: 02/16/2024 11:30 AM   Modules accepted: Orders

## 2024-03-06 ENCOUNTER — Telehealth: Payer: Self-pay

## 2024-03-06 NOTE — Telephone Encounter (Signed)
 Patient received Prolia  on 02/16/24. Patient not due until April 2026.  Please follow CAD Authorization workflow and place a CAM order to initiate Prolia  Benefits investigation. Standard turnaround time is 2 weeks.

## 2024-03-06 NOTE — Telephone Encounter (Signed)
 Please send in PA for Prolia  or alternative if applicable

## 2024-04-04 ENCOUNTER — Encounter: Payer: Self-pay | Admitting: Internal Medicine

## 2024-04-24 ENCOUNTER — Telehealth: Payer: Self-pay | Admitting: Internal Medicine

## 2024-04-24 DIAGNOSIS — I2510 Atherosclerotic heart disease of native coronary artery without angina pectoris: Secondary | ICD-10-CM

## 2024-04-24 NOTE — Telephone Encounter (Signed)
" °*  STAT* If patient is at the pharmacy, call can be transferred to refill team.   1. Which medications need to be refilled? (please list name of each medication and dose if known)   rosuvastatin  (CRESTOR ) 20 MG tablet  ELIQUIS  5 MG TABS tablet  ezetimibe  (ZETIA ) 10 MG tablet   metoprolol  succinate (TOPROL -XL) 50 MG 24 hr tablet   2. Would you like to learn more about the convenience, safety, & potential cost savings by using the [facility]? No    3. Are you open to using the [facility] (Type [facility]. No    4. Which pharmacy/location (including street and city if local pharmacy) is medication to be sent to? [id number] - [place], [place] - [address] AT [facility]     5. Do they need a 30 day or 90 day supply? 30 day   B "

## 2024-04-25 MED ORDER — ROSUVASTATIN CALCIUM 20 MG PO TABS: 20.0000 mg | ORAL_TABLET | Freq: Every day | ORAL | 0 refills | Status: AC

## 2024-04-25 MED ORDER — METOPROLOL SUCCINATE ER 50 MG PO TB24: 50.0000 mg | ORAL_TABLET | Freq: Every day | ORAL | 0 refills | Status: AC

## 2024-04-25 NOTE — Telephone Encounter (Signed)
 Requested Prescriptions   Signed Prescriptions Disp Refills   rosuvastatin  (CRESTOR ) 20 MG tablet 90 tablet 0    Sig: Take 1 tablet (20 mg total) by mouth daily.    Authorizing Provider: SANTO KELLY A    Ordering User: WILFRED, Mohamad Bruso  C   metoprolol  succinate (TOPROL -XL) 50 MG 24 hr tablet 90 tablet 0    Sig: Take 1 tablet (50 mg total) by mouth daily. Take with or immediately following a meal.    Authorizing Provider: SANTO KELLY A    Ordering User: WILFRED, Wreatha Sturgeon  C

## 2024-04-30 ENCOUNTER — Other Ambulatory Visit (HOSPITAL_COMMUNITY): Payer: Self-pay

## 2024-05-07 ENCOUNTER — Other Ambulatory Visit: Payer: Self-pay | Admitting: Physician Assistant

## 2024-05-07 DIAGNOSIS — Z952 Presence of prosthetic heart valve: Secondary | ICD-10-CM

## 2024-05-07 DIAGNOSIS — I10 Essential (primary) hypertension: Secondary | ICD-10-CM

## 2024-05-07 DIAGNOSIS — I350 Nonrheumatic aortic (valve) stenosis: Secondary | ICD-10-CM

## 2024-05-07 DIAGNOSIS — I421 Obstructive hypertrophic cardiomyopathy: Secondary | ICD-10-CM

## 2024-05-07 DIAGNOSIS — I480 Paroxysmal atrial fibrillation: Secondary | ICD-10-CM

## 2024-05-07 DIAGNOSIS — I340 Nonrheumatic mitral (valve) insufficiency: Secondary | ICD-10-CM

## 2024-05-11 ENCOUNTER — Telehealth: Payer: Self-pay

## 2024-05-11 NOTE — Telephone Encounter (Signed)
 Copied from CRM [id number]. Topic: Clinical - Prescription Issue >> May 11, 2024  2:19 PM Wess RAMAN wrote: Reason for CRM: Patient stated ELIQUIS  5 MG TABS tablet was over $550. Patient is currently out of medication. She would like to know if there is any type of medication assistance program that help with the cost.  Callback #: [phone]  Ph[facility]acy: [id number] - [place], [place] - [address] AT [facility] [address] [place] [address] Phone: [phone] Fax: [phone] Hours: Not open 24 hours

## 2024-05-16 NOTE — Telephone Encounter (Signed)
 Hey! Can we help her with this?

## 2024-05-16 NOTE — Telephone Encounter (Signed)
 Spoke with patient.  She will qualify for [employer] - applied and have mailed information to patient.  She has already picked up first 3 month supply, so included a form she can submit to [facility] for reimbursement of that.     Card ID      [id number] BIN             [id number] PCN           [id number] GRP           [id number]  Expires 04/15/2025

## 2024-05-29 ENCOUNTER — Ambulatory Visit (HOSPITAL_COMMUNITY)

## 2024-06-11 ENCOUNTER — Ambulatory Visit: Admitting: Emergency Medicine

## 2024-07-02 ENCOUNTER — Ambulatory Visit (HOSPITAL_COMMUNITY)

## 2024-07-04 ENCOUNTER — Ambulatory Visit: Admitting: Emergency Medicine

## 2024-11-05 ENCOUNTER — Ambulatory Visit
